# Patient Record
Sex: Female | Born: 1990 | Race: Black or African American | Hispanic: No | Marital: Married | Smoking: Former smoker
Health system: Southern US, Community
[De-identification: ages and names within clinical notes are randomized; demographics above are authoritative.]

## PROBLEM LIST (undated history)

## (undated) DIAGNOSIS — F419 Anxiety disorder, unspecified: Principal | ICD-10-CM

## (undated) DIAGNOSIS — M545 Low back pain, unspecified: Secondary | ICD-10-CM

## (undated) DIAGNOSIS — Z0000 Encounter for general adult medical examination without abnormal findings: Principal | ICD-10-CM

## (undated) DIAGNOSIS — K1379 Other lesions of oral mucosa: Secondary | ICD-10-CM

## (undated) DIAGNOSIS — S161XXA Strain of muscle, fascia and tendon at neck level, initial encounter: Secondary | ICD-10-CM

## (undated) DIAGNOSIS — R070 Pain in throat: Secondary | ICD-10-CM

## (undated) DIAGNOSIS — N611 Abscess of the breast and nipple: Secondary | ICD-10-CM

## (undated) DIAGNOSIS — L02416 Cutaneous abscess of left lower limb: Secondary | ICD-10-CM

## (undated) DIAGNOSIS — T7840XA Allergy, unspecified, initial encounter: Secondary | ICD-10-CM

## (undated) DIAGNOSIS — G43909 Migraine, unspecified, not intractable, without status migrainosus: Secondary | ICD-10-CM

## (undated) DIAGNOSIS — K8050 Calculus of bile duct without cholangitis or cholecystitis without obstruction: Secondary | ICD-10-CM

## (undated) DIAGNOSIS — L02415 Cutaneous abscess of right lower limb: Secondary | ICD-10-CM

## (undated) DIAGNOSIS — K219 Gastro-esophageal reflux disease without esophagitis: Secondary | ICD-10-CM

## (undated) DIAGNOSIS — E119 Type 2 diabetes mellitus without complications: Secondary | ICD-10-CM

## (undated) DIAGNOSIS — IMO0002 Reserved for concepts with insufficient information to code with codable children: Secondary | ICD-10-CM

## (undated) HISTORY — DX: Cutaneous abscess of left lower limb: L02.416

## (undated) HISTORY — DX: Abscess of the breast and nipple: N61.1

## (undated) HISTORY — DX: Type 2 diabetes mellitus without complications: E11.9

## (undated) HISTORY — PX: CHOLECYSTECTOMY: SHX55

## (undated) HISTORY — DX: Reserved for concepts with insufficient information to code with codable children: IMO0002

## (undated) HISTORY — PX: WISDOM TOOTH EXTRACTION: SHX21

## (undated) HISTORY — PX: INCISION AND DRAINAGE: SHX5863

## (undated) HISTORY — DX: Calculus of bile duct without cholangitis or cholecystitis without obstruction: K80.50

## (undated) HISTORY — DX: Gastro-esophageal reflux disease without esophagitis: K21.9

## (undated) HISTORY — DX: Anxiety disorder, unspecified: F41.9

## (undated) HISTORY — DX: Allergy, unspecified, initial encounter: T78.40XA

## (undated) HISTORY — DX: Cutaneous abscess of right lower limb: L02.415

## (undated) SURGERY — Surgical Case
Anesthesia: *Unknown

---
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
## 2014-11-08 ENCOUNTER — Ambulatory Visit: Payer: Self-pay

## 2014-11-16 NOTE — Pre-Procedure Instructions (Unsigned)
Vickki Delynn FlavinJanine Mosher  11/16/2014   Your procedure is scheduled on:  12/03/2014***  Report to {OR ARRIVAL LOCATIONS:20459} at *** AM.  Call this number if you have problems the morning of surgery: {OR PAT  NUMBER TO ZOXW:96045}CALL:20463}   Remember: Do not eat food or drink liquids after midnight.   Take these medicines the morning of surgery with A SIP OF WATER: ***You may take your Lorazepam and Paxil the morning of surgery.  Take 1/2 insulin dose the night before surgery and none the morning of surgery.      Do not wear jewelry, make-up or nail polish.  Do not wear lotions, powders, or perfumes. You may wear deodorant.  Do not shave 48 hours prior to surgery. Men may shave face and neck.  Do not bring valuables to the hospital.  [facility] is not responsible                  for any belongings or valuables.               Contacts, dentures or bridgework may not be worn into surgery.  Leave suitcase in the car. After surgery it may be brought to your room.  For patients admitted to the hospital, discharge time is determined by your                treatment team.               Patients discharged the day of surgery will not be allowed to drive  home.  Name and phone number of your driver: Mother or father  Special Instructions: {OR PAT SPECIAL INSTRUCTIONS:20464} No alcohol 24 hours before or after surgery.   Please read over the following fact sheets that you were given: {OR PAT  FACT SHEETS WUJWJ:19147}GIVEN:20460}

## 2014-12-03 ENCOUNTER — Ambulatory Visit: Payer: BC Managed Care – PPO | Admitting: Anesthesiology

## 2014-12-03 ENCOUNTER — Ambulatory Visit
Admission: RE | Admit: 2014-12-03 | Discharge: 2014-12-03 | Disposition: A | Discharge status: Home / Self Care | Payer: BC Managed Care – PPO | Source: Ambulatory Visit | Attending: Surgery | Admitting: Surgery

## 2014-12-03 ENCOUNTER — Encounter
Admission: RE | Disposition: A | Discharge status: Home / Self Care | Payer: Self-pay | Source: Ambulatory Visit | Attending: Surgery

## 2014-12-03 DIAGNOSIS — K219 Gastro-esophageal reflux disease without esophagitis: Secondary | ICD-10-CM | POA: Diagnosis not present

## 2014-12-03 DIAGNOSIS — F17210 Nicotine dependence, cigarettes, uncomplicated: Secondary | ICD-10-CM | POA: Insufficient documentation

## 2014-12-03 DIAGNOSIS — L0591 Pilonidal cyst without abscess: Secondary | ICD-10-CM | POA: Insufficient documentation

## 2014-12-03 DIAGNOSIS — E119 Type 2 diabetes mellitus without complications: Secondary | ICD-10-CM | POA: Diagnosis not present

## 2014-12-03 HISTORY — PX: PILONIDAL CYST EXCISION: SHX744

## 2014-12-03 LAB — POCT PREGNANCY, URINE
Preg Test, Ur: NEGATIVE
hCG Qual: NEGATIVE

## 2014-12-03 LAB — GLUCOSE, CAPILLARY
Glucose-Capillary: 335 mg/dL — ABNORMAL HIGH (ref 70–99)
Glucose-Capillary: 290 mg/dL — ABNORMAL HIGH (ref 70–99)
Glucose-Capillary: 251 mg/dL — ABNORMAL HIGH (ref 70–99)

## 2014-12-03 SURGERY — EXCISION, PILONIDAL CYST, EXTENSIVE
Anesthesia: General | Site: Coccyx | Wound class: Dirty or Infected

## 2014-12-03 MED ORDER — BUPIVACAINE-EPINEPHRINE (PF) 0.5% -1:200000 IJ SOLN
INTRAMUSCULAR | Status: AC
  Filled 2014-12-03: qty 30

## 2014-12-03 MED ORDER — ONDANSETRON HCL 4 MG/2ML IJ SOLN: 4.0000 mg | Freq: Once | INTRAMUSCULAR | Status: DC | PRN

## 2014-12-03 MED ORDER — GLYCOPYRROLATE 0.2 MG/ML IJ SOLN
INTRAMUSCULAR | Status: DC | PRN
  Administered 2014-12-03: 0.4 mg via INTRAVENOUS

## 2014-12-03 MED ORDER — SODIUM CHLORIDE 0.9 % IR SOLN
Status: DC | PRN
  Administered 2014-12-03: 200 mL

## 2014-12-03 MED ORDER — FENTANYL CITRATE (PF) 100 MCG/2ML IJ SOLN
INTRAMUSCULAR | Status: AC
  Administered 2014-12-03: 25 ug
  Filled 2014-12-03: qty 2

## 2014-12-03 MED ORDER — INSULIN ASPART 100 UNIT/ML ~~LOC~~ SOLN
8.0000 [IU] | Freq: Once | SUBCUTANEOUS | Status: AC
  Administered 2014-12-03: 8 [IU] via SUBCUTANEOUS

## 2014-12-03 MED ORDER — ROCURONIUM BROMIDE 100 MG/10ML IV SOLN
INTRAVENOUS | Status: DC | PRN
  Administered 2014-12-03: 5 mg via INTRAVENOUS
  Administered 2014-12-03: 20 mg via INTRAVENOUS

## 2014-12-03 MED ORDER — FAMOTIDINE 20 MG PO TABS
ORAL_TABLET | ORAL | Status: AC
  Administered 2014-12-03: 20 mg via ORAL
  Filled 2014-12-03: qty 1

## 2014-12-03 MED ORDER — MIDAZOLAM HCL 2 MG/2ML IJ SOLN
INTRAMUSCULAR | Status: DC | PRN
  Administered 2014-12-03: 2 mg via INTRAVENOUS

## 2014-12-03 MED ORDER — FAMOTIDINE 20 MG PO TABS
20.0000 mg | ORAL_TABLET | Freq: Once | ORAL | Status: AC
  Administered 2014-12-03: 20 mg via ORAL

## 2014-12-03 MED ORDER — INSULIN ASPART 100 UNIT/ML ~~LOC~~ SOLN
SUBCUTANEOUS | Status: AC
  Administered 2014-12-03: 8 [IU] via SUBCUTANEOUS
  Filled 2014-12-03: qty 8

## 2014-12-03 MED ORDER — ESMOLOL HCL 10 MG/ML IV SOLN
INTRAVENOUS | Status: DC | PRN
  Administered 2014-12-03: 20 mg via INTRAVENOUS

## 2014-12-03 MED ORDER — PROPOFOL 10 MG/ML IV BOLUS
INTRAVENOUS | Status: DC | PRN
  Administered 2014-12-03: 200 mg via INTRAVENOUS

## 2014-12-03 MED ORDER — FENTANYL CITRATE (PF) 100 MCG/2ML IJ SOLN
25.0000 ug | INTRAMUSCULAR | Status: AC | PRN
  Administered 2014-12-03 (×4): 25 ug via INTRAVENOUS

## 2014-12-03 MED ORDER — ONDANSETRON HCL 4 MG/2ML IJ SOLN
INTRAMUSCULAR | Status: DC | PRN
  Administered 2014-12-03: 4 mg via INTRAVENOUS

## 2014-12-03 MED ORDER — SODIUM CHLORIDE 0.9 % IV SOLN
INTRAVENOUS | Status: DC
  Administered 2014-12-03 (×2): via INTRAVENOUS

## 2014-12-03 MED ORDER — SUCCINYLCHOLINE CHLORIDE 20 MG/ML IJ SOLN
INTRAMUSCULAR | Status: DC | PRN
  Administered 2014-12-03: 100 mg via INTRAVENOUS

## 2014-12-03 MED ORDER — LIDOCAINE HCL (CARDIAC) 20 MG/ML IV SOLN
INTRAVENOUS | Status: DC | PRN
  Administered 2014-12-03: 30 mg via INTRAVENOUS

## 2014-12-03 MED ORDER — HYDROCODONE-ACETAMINOPHEN 5-325 MG PO TABS: 1.0000 | ORAL_TABLET | Freq: Four times a day (QID) | ORAL | Status: DC | PRN

## 2014-12-03 MED ORDER — NEOSTIGMINE METHYLSULFATE 10 MG/10ML IV SOLN
INTRAVENOUS | Status: DC | PRN
  Administered 2014-12-03: 3 mg via INTRAVENOUS

## 2014-12-03 MED ORDER — SODIUM CHLORIDE 0.9 % IV SOLN
1.5000 g | Freq: Once | INTRAVENOUS | Status: AC
  Administered 2014-12-03: 1.5 g via INTRAVENOUS
  Filled 2014-12-03 (×2): qty 1.5

## 2014-12-03 MED ORDER — FENTANYL CITRATE (PF) 100 MCG/2ML IJ SOLN
INTRAMUSCULAR | Status: DC | PRN
  Administered 2014-12-03: 100 ug via INTRAVENOUS

## 2014-12-03 MED ORDER — LACTATED RINGERS IV SOLN
INTRAVENOUS | Status: DC | PRN
  Administered 2014-12-03: 08:00:00 via INTRAVENOUS

## 2014-12-03 SURGICAL SUPPLY — 26 items
BENZOIN TINCTURE PRP APPL 2/3 (GAUZE/BANDAGES/DRESSINGS) ×2 IMPLANT
BLADE CLIPPER SURG (BLADE) IMPLANT
CANISTER SUCT 1200ML W/VALVE (MISCELLANEOUS) ×2 IMPLANT
CHLORAPREP W/TINT 26ML (MISCELLANEOUS) IMPLANT
DERMABOND ADVANCED (GAUZE/BANDAGES/DRESSINGS) ×2
DERMABOND ADVANCED .7 DNX12 (GAUZE/BANDAGES/DRESSINGS) ×2 IMPLANT
DRAPE LAPAROTOMY 100X77 ABD (DRAPES) ×2 IMPLANT
DURAPREP 26ML APPLICATOR (WOUND CARE) ×2 IMPLANT
ELECT CAUTERY BLADE 6.4 (BLADE) ×2 IMPLANT
GLOVE BIO SURGEON STRL SZ7.5 (GLOVE) ×8 IMPLANT
GOWN STRL REUS W/ TWL LRG LVL3 (GOWN DISPOSABLE) ×2 IMPLANT
GOWN STRL REUS W/TWL LRG LVL3 (GOWN DISPOSABLE) ×2
LABEL OR SOLS (LABEL) IMPLANT
NDL SAFETY 25GX1.5 (NEEDLE) IMPLANT
NS IRRIG 500ML POUR BTL (IV SOLUTION) ×2 IMPLANT
PACK BASIN MINOR ARMC (MISCELLANEOUS) ×2 IMPLANT
PAD GROUND ADULT SPLIT (MISCELLANEOUS) ×2 IMPLANT
STRAP SAFETY BODY (MISCELLANEOUS) ×2 IMPLANT
SUT ETHILON 2 0 FS 18 (SUTURE) IMPLANT
SUT ETHILON 3-0 FS-10 30 BLK (SUTURE)
SUT STRIP PLUS 1X5 (SUTURE) IMPLANT
SUT VIC AB 0 CT1 36 (SUTURE) ×2 IMPLANT
SUT VIC AB 2-0 CT1 (SUTURE) ×10 IMPLANT
SUTURE EHLN 3-0 FS-10 30 BLK (SUTURE) IMPLANT
SWABSTK COMLB BENZOIN TINCTURE (MISCELLANEOUS) ×2 IMPLANT
SYRINGE 10CC LL (SYRINGE) IMPLANT

## 2014-12-03 NOTE — Progress Notes (Signed)
PACU NOTE:  Pt Glucose reading is 251 at this time. Dr. Pernell DupreAdams notified. Pt is alert, calm, and talkative and has no s/s of distress noted. Encouraged pt to medicate when she gets home and encouraged Diabetes teaching. Pt verbalized understanding.

## 2014-12-03 NOTE — Discharge Instructions (Signed)
Pilonidal Pilonidal Cyst, Care After A pilonidal cyst occurs when hairs get trapped (ingrown) beneath the skin in the crease between the buttocks over your sacrum (the bone under that crease). Pilonidal cysts are most common in young men with a lot of body hair. When the cyst breaks(ruptured) or leaks, fluid from the cyst may cause burning and itching. If the cyst becomes infected, it causes a painful swelling filled with pus (abscess). The pus and trapped hairs need to be removed (often by lancing) so that the infection can heal. The word pilonidal means hair nest. HOME CARE INSTRUCTIONS If the pilonidal sinus was NOT DRAINING OR LANCED:  Keep the area clean and dry. Bathe or shower daily. Wash the area well with a germ-killing soap. Hot tub baths may help prevent infection. Dry the area well with a towel.  Avoid tight clothing in order to keep area as moisture-free as possible.  Keep area between buttocks as free from hair as possible. A depilatory may be used.  Take antibiotics as directed.  Only take over-the-counter or prescription medicines for pain, discomfort, or fever as directed by your caregiver. If the cyst WAS INFECTED AND NEEDED TO BE DRAINED:  Your caregiver may have packed the wound with gauze to keep the wound open. This allows the wound to heal from the inside outward and continue to drain.  Return as directed for a wound check.  If you take tub baths or showers, repack the wound with gauze as directed following. Sponge baths are a good alternative. Sitz baths may be used three to four times a day or as directed.  If an antibiotic was ordered to fight the infection, take as directed.  Only take over-the-counter or prescription medicines for pain, discomfort, or fever as directed by your caregiver.  If a drain was in place and removed, use sitz baths for 20 minutes 4 times per day. Clean the wound gently with mild unscented soap, pat dry, and then apply a dry dressing as  directed. If you had surgery and IT WAS MARSUPIALIZED (LEFT OPEN):  Your wound was packed with gauze to keep the wound open. This allows the wound to heal from the inside outwards and continue draining. The changing of the dressing regularly also helps keep the wound clean.  Return as directed for a wound check.  If you take tub baths or showers, repack the wound with gauze as directed following. Sponge baths are a good alternative. Sitz baths can also be used. This may be done three to four times a day or as directed.  If an antibiotic was ordered to fight the infection, take as directed.  Only take over-the-counter or prescription medicines for pain, discomfort, or fever as directed by your caregiver.  If you had surgery and the wound was closed you may care for it as directed. This generally includes keeping it dry and clean and dressing it as directed. SEEK MEDICAL CARE IF:   You have increased pain, swelling, redness, drainage, or bleeding from the area.  You have a fever.  You have muscles aches, dizziness, or a general ill feeling. Document Released: 08/16/2006 Document Revised: 03/18/2013 Document Reviewed: 10/31/2006 [facility] Patient Information 2015 [employer] [place]. This information is not intended to replace advice given to you by your health care provider. Make sure you discuss any questions you have with your health care provider.

## 2014-12-03 NOTE — Anesthesia Postprocedure Evaluation (Signed)
  Anesthesia Post-op Note  Patient: Dawn LenisMyra J Ortiz  Procedure(s) Performed: Procedure(s): CYST EXCISION PILONIDAL EXTENSIVE  Anesthesia type:General ETT  Patient location: PACU  Post pain: Pain level controlled  Post assessment: Post-op Vital signs reviewed, Patient's Cardiovascular Status Stable, Respiratory Function Stable, Patent Airway and No signs of Nausea or vomiting  Post vital signs: Reviewed and stable   Level of consciousness: awake, alert  and patient cooperative  Complications: No apparent anesthesia complications

## 2014-12-03 NOTE — Progress Notes (Signed)
H&P unchanged. Paper copy to be scanned into the system. The risks of surgery were again reviewed with the patient and family and they agree to proceed. 

## 2014-12-03 NOTE — Transfer of Care (Signed)
Immediate Anesthesia Transfer of Care Note  Patient: Dawn Ortiz  Procedure(s) Performed: Procedure(s): CYST EXCISION PILONIDAL EXTENSIVE  Patient Location: PACU  Anesthesia Type:General  Level of Consciousness: awake, alert , oriented and patient cooperative  Airway & Oxygen Therapy: Patient Spontanous Breathing and Patient connected to face mask oxygen  Post-op Assessment: Report given to RN, Post -op Vital signs reviewed and stable and Patient moving all extremities  Post vital signs: Reviewed and stable  Last Vitals:  Filed Vitals:   12/03/14 0643  BP: 148/85  Pulse: 122  Temp: 37 C  Resp: 16    Complications: No apparent anesthesia complications

## 2014-12-03 NOTE — Anesthesia Procedure Notes (Signed)
Procedure Name: Intubation Performed by: Edyth GunnelsGILBERT, Mekiyah Gladwell Pre-anesthesia Checklist: Patient identified Patient Re-evaluated:Patient Re-evaluated prior to inductionOxygen Delivery Method: Circle system utilized Preoxygenation: Pre-oxygenation with 100% oxygen Intubation Type: IV induction Ventilation: Mask ventilation without difficulty Laryngoscope Size: Mac and 3 Grade View: Grade II Tube type: Oral Number of attempts: 1 Airway Equipment and Method: Stylet

## 2014-12-03 NOTE — Anesthesia Preprocedure Evaluation (Signed)
Anesthesia Evaluation  Patient identified by MRN, date of birth, ID band Patient awake    Reviewed: Allergy & Precautions, H&P , NPO status , Patient's Chart, lab work & pertinent test results, reviewed documented beta blocker date and time   Airway Mallampati: II  TM Distance: >3 FB Neck ROM: full    Dental  (+) Teeth Intact   Pulmonary Current Smoker, former smoker,          Cardiovascular Rate:Normal     Neuro/Psych    GI/Hepatic GERD-  ,  Endo/Other  diabetes, Poorly Controlled, Type 1  Renal/GU      Musculoskeletal   Abdominal   Peds  Hematology   Anesthesia Other Findings   Reproductive/Obstetrics                             Anesthesia Physical Anesthesia Plan  ASA: II  Anesthesia Plan: General ETT   Post-op Pain Management:    Induction:   Airway Management Planned:   Additional Equipment:   Intra-op Plan:   Post-operative Plan:   Informed Consent: I have reviewed the patients History and Physical, chart, labs and discussed the procedure including the risks, benefits and alternatives for the proposed anesthesia with the patient or authorized representative who has indicated his/her understanding and acceptance.     Plan Discussed with:   Anesthesia Plan Comments:         Anesthesia Quick Evaluation

## 2014-12-03 NOTE — Op Note (Signed)
Operative Note  Preoperative Diagnosis: Pilonidal disease  Postoperative Diagnosis: Same  Operation Performed: Pilonidal excision  Surgeon: Marlynn PerkingW. F. Sya Nestler, Jr., M.D.   Assistant: None  Anesthesia: General Endotracheal  Date of Procedure: 12/03/2014   Procedure in Detail:  The risks (including the possibility of adjacent organ injury, the necessity of converting to an open procedure, and the risk of postoperative infection / abscess), potential benefits, non-surgical treatment options, and expected outcomes were reviewed with the patient. The patient concurred with the proposed plan and agreed to proceed, giving informed consent.   Prior to the induction of anesthesia, antibiotic prophylaxis was administered. The patient was placed prone on the OR table and prepped and draped in the usual sterile fashion.   A thin elliptical incision was made surrounding the natal cleft, the pits, and all of the scar tissue. This was carried down through the subcutaneous tissue all the way to the presacral fascia, and included all of the granulation tissue and pathology. Hemostasis was achieved with electrocautery and figure of eight 2-0 Vicryl sutures. A 3 layer closure was performed with the deep layer performed with interrupted 2-0 Vicryl the intermediate level with a running 3-0 Monocryl and subcuticular layer of running 3-0 PDS. Dermabond was applied completing the procedure.  Findings: No active disease; no pus, no cellulitis, no exposed hair.          Specimens: Pilonidal area           Complications: None; the patient tolerated the procedure well.   Claude MangesWilliam F Deboraha Goar, MD 12/03/2014

## 2014-12-06 LAB — SURGICAL PATHOLOGY

## 2014-12-06 NOTE — OR Nursing (Signed)
Follow up appointment scheduled for 12/10/14.  Patient has been improving each day

## 2014-12-07 ENCOUNTER — Encounter: Payer: Self-pay | Admitting: Surgery

## 2015-03-04 ENCOUNTER — Ambulatory Visit: Payer: Self-pay | Admitting: Surgery

## 2016-01-09 ENCOUNTER — Ambulatory Visit: Payer: Self-pay | Admitting: Physician Assistant

## 2016-01-09 ENCOUNTER — Encounter: Payer: Self-pay | Admitting: Physician Assistant

## 2016-01-09 VITALS — BP 120/80 | HR 110 | Temp 98.4°F

## 2016-01-09 DIAGNOSIS — R000 Tachycardia, unspecified: Secondary | ICD-10-CM

## 2016-01-09 NOTE — Addendum Note (Signed)
Addended by: Catha BrowEACON, MONIQUE T on: 01/09/2016 03:04 PM   Modules accepted: Orders

## 2016-01-09 NOTE — Progress Notes (Signed)
S: c/o chest discomfort for over a week, states her heart rate has been high for about 2years, now its staying high all of the time and has been worsened by the exercise she's doing, is having to stop and catch her breath, ?if asthma but her chest will hurt when this happens, no known wheezing, is DMI, denies palpitations, fluttering, or other problems, has bc implant , had one 3 for 3 years and just had it switched out this spring  O: vitals w elevated hr at 110, lungs c t a, cv rrr, nad; ekg shows tachycardia  A: tachycardia with chest discomfort  P: refer to cardiology, no strenuous exercise until has been cleared by cardiology

## 2016-01-09 NOTE — Addendum Note (Signed)
Addended by: Catha BrowEACON, MONIQUE T on: 01/09/2016 01:31 PM   Modules accepted: Orders

## 2016-01-09 NOTE — Progress Notes (Signed)
Patient ID: Arnoldo LenisMyra J Ortiz, female   DOB: 16-Mar-1991, 25 y.o.   MRN: [id number] Patient [facility] for 01/11/16 at 2:45.  Patient has been notified and will be at her appointment.

## 2016-01-11 ENCOUNTER — Encounter: Payer: Self-pay | Admitting: Cardiology

## 2016-01-11 ENCOUNTER — Ambulatory Visit (INDEPENDENT_AMBULATORY_CARE_PROVIDER_SITE_OTHER): Payer: BLUE CROSS/BLUE SHIELD | Admitting: Cardiology

## 2016-01-11 VITALS — BP 118/80 | HR 108 | Ht 61.5 in | Wt 176.5 lb

## 2016-01-11 DIAGNOSIS — R079 Chest pain, unspecified: Secondary | ICD-10-CM | POA: Diagnosis not present

## 2016-01-11 DIAGNOSIS — R0602 Shortness of breath: Secondary | ICD-10-CM | POA: Diagnosis not present

## 2016-01-11 DIAGNOSIS — R000 Tachycardia, unspecified: Secondary | ICD-10-CM | POA: Diagnosis not present

## 2016-01-11 NOTE — Progress Notes (Signed)
Cardiology Office Note   Date:  01/11/2016   ID:  Dawn Ortiz, DOB [birth year], MRN [id number]  Referring Doctor:  Rafael Bihari, MD   Cardiologist:   Wende Bushy, MD   Reason for consultation:  Chief Complaint  Patient presents with  . other    Ref by Dr. Andre Lefort for rapid heart beats. Meds reviewed by the patient verbally. Pt. c/o chest pain, shortness of breath and rapid heart beats.       History of Present Illness: Dawn Ortiz is a 25 y.o. female who presents forPalpitations  Patient reports that she's always had an elevated heart rate for approximately 2 years now. This is noted on routine office visits to doctors, that her resting heart rate is up in the 90s. More recently though, she has noted significant palpitations and chest discomfort during and after exercise/working out in the gym/boot camp. Palpitations are moderate to severe intensity, lasting minutes at a time, staying in the chest, nonradiating. The chest discomfort is described as the discomfort mainly on the right side of the chest, nonradiating been present for days now.  Patient is a type I diabetic. She is not aware of her A1c.  No fever, cough, colds, abdominal pain, PND, orthopnea, edema   ROS:  Please see the history of present illness. Aside from mentioned under HPI, all other systems are reviewed and negative.     Past Medical History  Diagnosis Date  . Diabetes mellitus without complication ([place])   . Anxiety   . GERD (gastroesophageal reflux disease)     Past Surgical History  Procedure Laterality Date  . Incision and drainage    . Pilonidal cyst excision  12/03/2014    Procedure: CYST EXCISION PILONIDAL EXTENSIVE;  Surgeon: Molly Maduro, MD;  Location: [facility] ORS;  Service: General;;     reports that she has quit smoking. Her smoking use included Cigarettes. She does not have any smokeless tobacco history on file. She reports that she drinks about 0.6 - 1.2 oz of  alcohol per week.   Family history is unknown by patient. Father probably diagnosed with CAD in his 43s. Maternal grandmother likely has a pacemaker, patient is unsure  Current Outpatient Prescriptions  Medication Sig Dispense Refill  . etonogestrel (NEXPLANON) 68 MG IMPL implant 1 each by Subdermal route once.    Marland Kitchen HYDROcodone-acetaminophen (NORCO) 5-325 MG per tablet Take 1 tablet by mouth every 6 (six) hours as needed for moderate pain. 30 tablet 0  . insulin glargine (LANTUS) 100 UNIT/ML injection Inject 65 Units into the skin at bedtime.    . insulin lispro protamine-lispro (HUMALOG 75/25 MIX) (75-25) 100 UNIT/ML SUSP injection Inject 50 Units into the skin 2 (two) times daily.    Marland Kitchen LORazepam (ATIVAN) 0.5 MG tablet Take 0.5 mg by mouth every 8 (eight) hours as needed for anxiety.    Marland Kitchen PARoxetine (PAXIL) 10 MG tablet Take 10 mg by mouth at bedtime.     No current facility-administered medications for this visit.    Allergies: Sulfa antibiotics    PHYSICAL EXAM: VS:  BP 118/80 mmHg  Pulse 108  Ht 5' 1.5" (1.562 m)  Wt 176 lb 8 oz (80.06 kg)  BMI 32.81 kg/m2  SpO2 98% , Body mass index is 32.81 kg/(m^2). Wt Readings from Last 3 Encounters:  01/11/16 176 lb 8 oz (80.06 kg)    GENERAL:  well developed, well nourished, obese, not in acute distress HEENT: normocephalic, pink conjunctivae, anicteric sclerae,  no xanthelasma, normal dentition, oropharynx clear NECK:  no neck vein engorgement, JVP normal, no hepatojugular reflux, carotid upstroke brisk and symmetric, no bruit, no thyromegaly, no lymphadenopathy LUNGS:  good respiratory effort, clear to auscultation bilaterally CV:  PMI not displaced, no thrills, no lifts, S1 and S2 within normal limits, no palpable S3 or S4, no murmurs, no rubs, no gallops ABD:  Soft, nontender, nondistended, normoactive bowel sounds, no abdominal aortic bruit, no hepatomegaly, no splenomegaly MS: nontender back, no kyphosis, no scoliosis, no joint  deformities EXT:  2+ DP/PT pulses, no edema, no varicosities, no cyanosis, no clubbing SKIN: warm, nondiaphoretic, normal turgor, no ulcers NEUROPSYCH: alert, oriented to person, place, and time, sensory/motor grossly intact, normal mood, appropriate affect  Recent Labs: No results found for requested labs within last 365 days.   Lipid Panel No results found for: CHOL, TRIG, HDL, CHOLHDL, VLDL, LDLCALC, LDLDIRECT   Other studies Reviewed:  EKG:  The ekg from 01/11/2016 was personally reviewed by me and it revealed sinus tachycardia, 108 BPM, nonspecific T-wave changes.  Additional studies/ records that were reviewed personally reviewed by me today include: None available   ASSESSMENT AND PLAN:  Palpitations Check TSH and free T4, plasma fractionated metanephrines, CBC, CMP. Recommend echocardiogram. Recommend Holter monitor.   Chest pain and shortness of breath  Chest pain is atypical  Resting EKG is sinus tachycardia with nonspecific T-wave changes which may be related to the rate Recommend stress echocardiogram for further evaluation. Patient is diabetic, type I. Recommend to auscultate lungs and evaluate for wheezing at near peak to peak exercise or if patient starts complaining of shortness of breath or chest pain during stress test.  Current medicines are reviewed at length with the patient today.  The patient does not have concerns regarding medicines.  Labs/ tests ordered today include:  Orders Placed This Encounter  Procedures  . T4, free  . TSH  . CBC with Differential/Platelet  . Comp Met (CMET)  . Metanephrines, plasma  . Holter monitor - 24 hour  . EKG 12-Lead  . ECHOCARDIOGRAM COMPLETE  . ECHO STRESS TEST    I had a lengthy and detailed discussion with the patient regarding diagnoses, prognosis, diagnostic options, treatment options .   I counseled the patient on importance of lifestyle modification including heart healthy diet, regular physical  activity once cardiac workup was done   Disposition:   FU with undersigned after tests  Signed, Wende Bushy, MD  01/11/2016 3:50 PM    [place]

## 2016-01-11 NOTE — Patient Instructions (Addendum)
Medication Instructions:  Your physician recommends that you continue on your current medications as directed. Please refer to the Current Medication list given to you today.   Labwork: Your physician recommends that you return for fasting lab work to [facility] medical mall entrance of the hospital. Check into front desk to register and then head to lab. Make sure to not eat or drink after midnight prior to coming in for labs. Labs we are checking are TSH, T4, CBC, CMP, and Plasma metanephrines. Take lab slips with you.   Testing/Procedures: Your physician has requested that you have an echocardiogram. Echocardiography is a painless test that uses sound waves to create images of your heart. It provides your doctor with information about the size and shape of your heart and how well your heart's chambers and valves are working. This procedure takes approximately one hour. There are no restrictions for this procedure.  Date & Time: _____________________________________________________  Your physician has recommended that you wear a holter monitor. Holter monitors are medical devices that record the heart's electrical activity. Doctors most often use these monitors to diagnose arrhythmias. Arrhythmias are problems with the speed or rhythm of the heartbeat. The monitor is a small, portable device. You can wear one while you do your normal daily activities. This is usually used to diagnose what is causing palpitations/syncope (passing out).  Date & Time: _____________________________________________________  Your physician has requested that you have a stress echocardiogram. For further information please visit [email or web address] Please follow instruction sheet as given.  Date & Time: ____________________________________________________  Follow-Up: Your physician recommends that you schedule a follow-up appointment after testing to review results.   Date & Time:  _______________________________________________________   Any Other Special Instructions Will Be Listed Below (If Applicable).     If you need a refill on your cardiac medications before your next appointment, please call your pharmacy.  Echocardiogram An echocardiogram, or echocardiography, uses sound waves (ultrasound) to produce an image of your heart. The echocardiogram is simple, painless, obtained within a short period of time, and offers valuable information to your health care provider. The images from an echocardiogram can provide information such as:  Evidence of coronary artery disease (CAD).  Heart size.  Heart muscle function.  Heart valve function.  Aneurysm detection.  Evidence of a past heart attack.  Fluid buildup around the heart.  Heart muscle thickening.  Assess heart valve function. LET [facility] CARE PROVIDER KNOW ABOUT:  Any allergies you have.  All medicines you are taking, including vitamins, herbs, eye drops, creams, and over-the-counter medicines.  Previous problems you or members of your family have had with the use of anesthetics.  Any blood disorders you have.  Previous surgeries you have had.  Medical conditions you have.  Possibility of pregnancy, if this applies. BEFORE THE PROCEDURE  No special preparation is needed. Eat and drink normally.  PROCEDURE   In order to produce an image of your heart, gel will be applied to your chest and a wand-like tool (transducer) will be moved over your chest. The gel will help transmit the sound waves from the transducer. The sound waves will harmlessly bounce off your heart to allow the heart images to be captured in real-time motion. These images will then be recorded.  You may need an IV to receive a medicine that improves the quality of the pictures. AFTER THE PROCEDURE You may return to your normal schedule including diet, activities, and medicines, unless your health care provider tells  you otherwise.  This information is not intended to replace advice given to you by your health care provider. Make sure you discuss any questions you have with your health care provider.   Document Released: 07/13/2000 Document Revised: 08/06/2014 Document Reviewed: 03/23/2013 Elsevier Interactive Patient Education 2016 [employer].   Exercise Stress Echocardiogram An exercise stress echocardiogram is a heart (cardiac) test used to check the function of your heart. This test may also be called an exercise stress echocardiography or stress echo. This stress test will check how well your heart muscle and valves are working and determine if your heart muscle is getting enough blood. You will exercise on a treadmill to naturally increase or stress the functioning of your heart.  An echocardiogram uses sound waves (ultrasound) to produce an image of your heart. If your heart does not work normally, it may indicate coronary artery disease with poor coronary blood supply. The coronary arteries are the arteries that bring blood and oxygen to your heart. LET [facility] CARE PROVIDER KNOW ABOUT:  Any allergies you have.  All medicines you are taking, including vitamins, herbs, eye drops, creams, and over-the-counter medicines.  Previous problems you or members of your family have had with the use of anesthetics.  Any blood disorders you have.  Previous surgeries you have had.  Medical conditions you have.  Possibility of pregnancy, if this applies. RISKS AND COMPLICATIONS Generally, this is a safe procedure. However, as with any procedure, complications can occur. Possible complications can include:  You develop pain or pressure in the following areas:  Chest.  Jaw or neck.  Between your shoulder blades.  Radiating down your left arm.  Dizziness or lightheadedness.  Shortness of breath.  Increased or irregular heartbeat.  Nausea or vomiting.  Heart attack (rare). BEFORE THE  PROCEDURE  Avoid all forms of caffeine for 24 hours before your test or as directed by your health care provider. This includes coffee, tea (even decaffeinated tea), caffeinated sodas, chocolate, cocoa, and certain pain medicines.  Follow your health care provider's instructions regarding eating and drinking before the test.  Take your medicines as directed at regular times with water unless instructed otherwise. Exceptions may include:  If you have diabetes, ask how you are to take your insulin or pills. It is common to adjust insulin dosing the morning of the test.  If you are taking beta-blocker medicines, it is important to talk to your health care provider about these medicines well before the date of your test. Taking beta-blocker medicines may interfere with the test. In some cases, these medicines need to be changed or stopped 24 hours or more before the test.  If you wear a nitroglycerin patch, it may need to be removed prior to the test. Ask your health care provider if the patch should be removed before the test.  If you use an inhaler for any breathing condition, bring it with you to the test.  If you are an outpatient, bring a snack so you can eat right after the stress phase of the test.  Do not smoke for 4 hours prior to the test or as directed by your health care provider.  Wear loose-fitting clothes and comfortable shoes for the test. This test involves walking on a treadmill. PROCEDURE   Multiple electrodes will be put on your chest. If needed, small areas of your chest may be shaved to get better contact with the electrodes. Once the electrodes are attached to your body, multiple wires will be attached  to the electrodes, and your heart rate will be monitored.  You will have an echocardiogram done at rest.  To produce this image of your heart, gel is applied to your chest, and a wand-like tool (transducer) is moved over the chest. The transducer sends the sound waves  through the chest to create the moving images of your heart.  You may need an IV to receive a medication that improves the quality of the pictures.  You will then walk on a treadmill. The treadmill will be started at a slow pace. The treadmill speed and incline will gradually be increased to raise your heart rate.  At the peak of exercise, the treadmill will be stopped. You will lie down immediately on a bed so that a second echocardiogram can be done to visualize your heart's motion with exercise.  The test usually takes 30-60 minutes to complete. AFTER THE PROCEDURE  Your heart rate and blood pressure will be monitored after the test.  You may return to your normal schedule, including diet, activities, and medicines, unless your health care provider tells you otherwise.   This information is not intended to replace advice given to you by your health care provider. Make sure you discuss any questions you have with your health care provider.   Document Released: 07/20/2004 Document Revised: 07/21/2013 Document Reviewed: 03/23/2013 Elsevier Interactive Patient Education 2016 Elsevier Inc.   Holter Monitoring A Holter monitor is a small device that is used to detect abnormal heart rhythms. It clips to your clothing and is connected by wires to flat, sticky disks (electrodes) that attach to your chest. It is worn continuously for 24-48 hours. HOME CARE INSTRUCTIONS  Wear your Holter monitor at all times, even while exercising and sleeping, for as long as directed by your health care provider.  Make sure that the Holter monitor is safely clipped to your clothing or close to your body as recommended by your health care provider.  Do not get the monitor or wires wet.  Do not put body lotion or moisturizer on your chest.  Keep your skin clean.  Keep a diary of your daily activities, such as walking and doing chores. If you feel that your heartbeat is abnormal or that your heart is  fluttering or skipping a beat:  Record what you are doing when it happens.  Record what time of day the symptoms occur.  Return your Holter monitor as directed by your health care provider.  Keep all follow-up visits as directed by your health care provider. This is important. SEEK IMMEDIATE MEDICAL CARE IF:  You feel lightheaded or you faint.  You have trouble breathing.  You feel pain in your chest, upper arm, or jaw.  You feel sick to your stomach and your skin is pale, cool, or damp.  You heartbeat feels unusual or abnormal.   This information is not intended to replace advice given to you by your health care provider. Make sure you discuss any questions you have with your health care provider.   Document Released: 04/13/2004 Document Revised: 08/06/2014 Document Reviewed: 02/22/2014 Elsevier Interactive Patient Education [employer] Elsevier Inc.

## 2016-01-27 ENCOUNTER — Encounter (INDEPENDENT_AMBULATORY_CARE_PROVIDER_SITE_OTHER): Payer: Self-pay

## 2016-01-27 ENCOUNTER — Other Ambulatory Visit: Payer: Self-pay | Admitting: Physician Assistant

## 2016-01-27 DIAGNOSIS — Z299 Encounter for prophylactic measures, unspecified: Secondary | ICD-10-CM

## 2016-01-27 NOTE — Progress Notes (Signed)
Patient came in to have blood drawn for testing per Dr. Almond Lint[facility]ileen Ingal [facility] [place].

## 2016-01-30 LAB — COMPREHENSIVE METABOLIC PANEL
A/G RATIO: 1.3 (ref 1.2–2.2)
ALBUMIN: 4.5 g/dL (ref 3.5–5.5)
ALK PHOS: 134 IU/L — AB (ref 39–117)
ALT: 16 IU/L (ref 0–32)
AST: 12 IU/L (ref 0–40)
BUN/Creatinine Ratio: 18 (ref 9–23)
BUN: 12 mg/dL (ref 6–20)
Bilirubin Total: 0.3 mg/dL (ref 0.0–1.2)
CO2: 21 mmol/L (ref 18–29)
Calcium: 9.7 mg/dL (ref 8.7–10.2)
Chloride: 98 mmol/L (ref 96–106)
Creatinine, Ser: 0.68 mg/dL (ref 0.57–1.00)
GFR calc Af Amer: 142 mL/min/{1.73_m2} (ref >59)
GFR, EST NON AFRICAN AMERICAN: 123 mL/min/{1.73_m2} (ref >59)
GLOBULIN, TOTAL: 3.4 g/dL (ref 1.5–4.5)
Glucose: 279 mg/dL — ABNORMAL HIGH (ref 65–99)
POTASSIUM: 4.6 mmol/L (ref 3.5–5.2)
SODIUM: 136 mmol/L (ref 134–144)
Total Protein: 7.9 g/dL (ref 6.0–8.5)

## 2016-01-30 LAB — CBC WITH DIFFERENTIAL/PLATELET
BASOS: 0 %
Basophils Absolute: 0 10*3/uL (ref 0.0–0.2)
EOS (ABSOLUTE): 0.1 10*3/uL (ref 0.0–0.4)
Eos: 1 %
Hematocrit: 40.7 % (ref 34.0–46.6)
Hemoglobin: 13 g/dL (ref 11.1–15.9)
Immature Grans (Abs): 0 10*3/uL (ref 0.0–0.1)
Immature Granulocytes: 0 %
LYMPHS ABS: 3.5 10*3/uL — AB (ref 0.7–3.1)
Lymphs: 40 %
MCH: 25.4 pg — ABNORMAL LOW (ref 26.6–33.0)
MCHC: 31.9 g/dL (ref 31.5–35.7)
MCV: 80 fL (ref 79–97)
MONOS ABS: 0.4 10*3/uL (ref 0.1–0.9)
Monocytes: 5 %
Neutrophils Absolute: 4.6 10*3/uL (ref 1.4–7.0)
Neutrophils: 54 %
Platelets: 280 10*3/uL (ref 150–379)
RBC: 5.12 x10E6/uL (ref 3.77–5.28)
RDW: 14.3 % (ref 12.3–15.4)
WBC: 8.6 10*3/uL (ref 3.4–10.8)

## 2016-01-30 LAB — TSH+FREE T4
Free T4: 1.27 ng/dL (ref 0.82–1.77)
TSH: 0.712 u[IU]/mL (ref 0.450–4.500)

## 2016-01-30 LAB — METHYLMALONIC ACID, SERUM: Methylmalonic Acid: 71 nmol/L (ref 0–378)

## 2016-02-01 NOTE — Progress Notes (Signed)
Dr. Alvino ChapelIngal did not order the Methylmalonic acid. She ordered a plasma metanephrines test which was not done. Called [facility] regarding this and had them add this test on.

## 2016-02-05 LAB — METANEPHRINES, PLASMA
Metanephrine, Free: 14 pg/mL (ref 0–62)
NORMETANEPHRINE FREE: 61 pg/mL (ref 0–145)

## 2016-02-05 LAB — SPECIMEN STATUS REPORT

## 2016-02-08 ENCOUNTER — Other Ambulatory Visit: Payer: Self-pay

## 2016-02-15 ENCOUNTER — Other Ambulatory Visit: Payer: Self-pay

## 2016-02-21 ENCOUNTER — Ambulatory Visit: Payer: Self-pay | Admitting: Physician Assistant

## 2016-02-24 ENCOUNTER — Ambulatory Visit (INDEPENDENT_AMBULATORY_CARE_PROVIDER_SITE_OTHER): Payer: Managed Care, Other (non HMO)

## 2016-02-24 ENCOUNTER — Encounter: Payer: Self-pay | Admitting: Physician Assistant

## 2016-02-24 ENCOUNTER — Other Ambulatory Visit: Payer: Self-pay

## 2016-02-24 ENCOUNTER — Ambulatory Visit: Payer: Self-pay | Admitting: Physician Assistant

## 2016-02-24 ENCOUNTER — Ambulatory Visit: Payer: Self-pay

## 2016-02-24 VITALS — BP 110/70 | HR 111 | Temp 98.5°F

## 2016-02-24 DIAGNOSIS — R079 Chest pain, unspecified: Secondary | ICD-10-CM

## 2016-02-24 DIAGNOSIS — F411 Generalized anxiety disorder: Secondary | ICD-10-CM

## 2016-02-24 DIAGNOSIS — R000 Tachycardia, unspecified: Secondary | ICD-10-CM | POA: Diagnosis not present

## 2016-02-24 DIAGNOSIS — R0602 Shortness of breath: Secondary | ICD-10-CM | POA: Diagnosis not present

## 2016-02-24 MED ORDER — PAROXETINE HCL 10 MG PO TABS
10.0000 mg | ORAL_TABLET | Freq: Every day | ORAL | 5 refills | Status: DC
Start: 2016-02-24 — End: 2017-05-27

## 2016-02-24 NOTE — Progress Notes (Signed)
S: ? If could get a med refill, states she use to take paroxetine and xanax but hasn't taken it in a long time bc she felt better, now is having some anxiety sx and would like to go back on medication  O: vitals w elevated heart rate, lungs c t a, cv reg rhythm with tachycardia, neuro intact  A: anxiety  P paroxetine 10mg , explained to pt that we do not rx xanax on regular basis and she will need to see her pcp if she wants this medication

## 2016-02-27 ENCOUNTER — Other Ambulatory Visit: Payer: Self-pay

## 2016-02-27 LAB — ECHOCARDIOGRAM COMPLETE

## 2016-02-28 ENCOUNTER — Telehealth: Payer: Self-pay | Admitting: *Deleted

## 2016-02-28 NOTE — Telephone Encounter (Signed)
-----   Message from Almond Lint, MD sent at 02/28/2016 10:33 AM EDT ----- Heart function okay

## 2016-02-28 NOTE — Telephone Encounter (Signed)
Patient called in for results. Reviewed detailed echocardiogram results with patient. Also confirmed patients upcoming stress echo and holter monitor appointments and instructions. She verbalized understanding of our conversation and had no further questions.

## 2016-02-29 ENCOUNTER — Ambulatory Visit: Payer: Self-pay | Admitting: Cardiology

## 2016-03-01 ENCOUNTER — Ambulatory Visit: Payer: Managed Care, Other (non HMO)

## 2016-03-01 ENCOUNTER — Ambulatory Visit (INDEPENDENT_AMBULATORY_CARE_PROVIDER_SITE_OTHER): Payer: Managed Care, Other (non HMO)

## 2016-03-01 DIAGNOSIS — R000 Tachycardia, unspecified: Secondary | ICD-10-CM | POA: Diagnosis not present

## 2016-03-01 DIAGNOSIS — R079 Chest pain, unspecified: Secondary | ICD-10-CM

## 2016-03-01 DIAGNOSIS — R0602 Shortness of breath: Secondary | ICD-10-CM | POA: Diagnosis not present

## 2016-03-01 LAB — ECHOCARDIOGRAM STRESS TEST
CSEPEW: 10.7 METS
CSEPHR: 103 %
CSEPPHR: 203 {beats}/min
Exercise duration (min): 9 min
Exercise duration (sec): 23 s
MPHR: 196 {beats}/min
Rest HR: 116 {beats}/min

## 2016-03-06 ENCOUNTER — Ambulatory Visit: Payer: Self-pay | Admitting: Cardiology

## 2016-03-15 ENCOUNTER — Ambulatory Visit (INDEPENDENT_AMBULATORY_CARE_PROVIDER_SITE_OTHER): Payer: Managed Care, Other (non HMO)

## 2016-03-15 DIAGNOSIS — R0602 Shortness of breath: Secondary | ICD-10-CM | POA: Diagnosis not present

## 2016-03-15 DIAGNOSIS — R079 Chest pain, unspecified: Secondary | ICD-10-CM | POA: Diagnosis not present

## 2016-03-15 DIAGNOSIS — R000 Tachycardia, unspecified: Secondary | ICD-10-CM | POA: Diagnosis not present

## 2016-03-21 ENCOUNTER — Ambulatory Visit
Admission: RE | Admit: 2016-03-21 | Discharge: 2016-03-21 | Disposition: A | Discharge status: Home / Self Care | Payer: Managed Care, Other (non HMO) | Source: Ambulatory Visit | Attending: Cardiology | Admitting: Cardiology

## 2016-03-21 DIAGNOSIS — R000 Tachycardia, unspecified: Secondary | ICD-10-CM | POA: Insufficient documentation

## 2016-03-24 NOTE — Progress Notes (Deleted)
Cardiology Office Note   Date:  03/24/2016   ID:  Dawn Ortiz, DOB 24-Jan-1991, MRN [id number]  Referring Doctor:  Ashley Mariner, MD   Cardiologist:   Almond Lint, MD   Reason for consultation:  No chief complaint on file.     History of Present Illness: Dawn Ortiz is a 25 y.o. female who presents forPalpitations  Patient reports that she's always had an elevated heart rate for approximately 2 years now. This is noted on routine office visits to doctors, that her resting heart rate is up in the 90s. More recently though, she has noted significant palpitations and chest discomfort during and after exercise/working out in the gym/boot camp. Palpitations are moderate to severe intensity, lasting minutes at a time, staying in the chest, nonradiating. The chest discomfort is described as the discomfort mainly on the right side of the chest, nonradiating been present for days now.  Patient is a type I diabetic. She is not aware of her A1c.  No fever, cough, colds, abdominal pain, PND, orthopnea, edema   ROS:  Please see the history of present illness. Aside from mentioned under HPI, all other systems are reviewed and negative.     Past Medical History:  Diagnosis Date  . Anxiety   . Diabetes mellitus without complication (HCC)   . GERD (gastroesophageal reflux disease)     Past Surgical History:  Procedure Laterality Date  . INCISION AND DRAINAGE    . PILONIDAL CYST EXCISION  12/03/2014   Procedure: CYST EXCISION PILONIDAL EXTENSIVE;  Surgeon: Duwaine Maxin, MD;  Location: [facility] ORS;  Service: General;;     reports that she has quit smoking. Her smoking use included Cigarettes. She does not have any smokeless tobacco history on file. She reports that she drinks about 0.6 - 1.2 oz of alcohol per week .   Family history is unknown by patient. Father probably diagnosed with CAD in his 30s. Maternal grandmother likely has a pacemaker, patient is  unsure  Current Outpatient Prescriptions  Medication Sig Dispense Refill  . etonogestrel (NEXPLANON) 68 MG IMPL implant 1 each by Subdermal route once.    Marland Kitchen HYDROcodone-acetaminophen (NORCO) 5-325 MG per tablet Take 1 tablet by mouth every 6 (six) hours as needed for moderate pain. 30 tablet 0  . insulin glargine (LANTUS) 100 UNIT/ML injection Inject 65 Units into the skin at bedtime.    . insulin lispro protamine-lispro (HUMALOG 75/25 MIX) (75-25) 100 UNIT/ML SUSP injection Inject 50 Units into the skin 2 (two) times daily.    Marland Kitchen LORazepam (ATIVAN) 0.5 MG tablet Take 0.5 mg by mouth every 8 (eight) hours as needed for anxiety.    Marland Kitchen PARoxetine (PAXIL) 10 MG tablet Take 1 tablet (10 mg total) by mouth at bedtime. 30 tablet 5   No current facility-administered medications for this visit.     Allergies: Sulfa antibiotics    PHYSICAL EXAM: VS:  There were no vitals taken for this visit. , There is no height or weight on file to calculate BMI. Wt Readings from Last 3 Encounters:  01/11/16 176 lb 8 oz (80.1 kg)    GENERAL:  well developed, well nourished, obese, not in acute distress HEENT: normocephalic, pink conjunctivae, anicteric sclerae, no xanthelasma, normal dentition, oropharynx clear NECK:  no neck vein engorgement, JVP normal, no hepatojugular reflux, carotid upstroke brisk and symmetric, no bruit, no thyromegaly, no lymphadenopathy LUNGS:  good respiratory effort, clear to auscultation bilaterally CV:  PMI not displaced, no  thrills, no lifts, S1 and S2 within normal limits, no palpable S3 or S4, no murmurs, no rubs, no gallops ABD:  Soft, nontender, nondistended, normoactive bowel sounds, no abdominal aortic bruit, no hepatomegaly, no splenomegaly MS: nontender back, no kyphosis, no scoliosis, no joint deformities EXT:  2+ DP/PT pulses, no edema, no varicosities, no cyanosis, no clubbing SKIN: warm, nondiaphoretic, normal turgor, no ulcers NEUROPSYCH: alert, oriented to person,  place, and time, sensory/motor grossly intact, normal mood, appropriate affect  Recent Labs: 01/27/2016: ALT 16; BUN 12; Creatinine, Ser 0.68; Platelets 280; Potassium 4.6; Sodium 136; TSH 0.712   Lipid Panel No results found for: CHOL, TRIG, HDL, CHOLHDL, VLDL, LDLCALC, LDLDIRECT   Other studies Reviewed:  EKG:  The ekg from 01/11/2016 was personally reviewed by me and it revealed sinus tachycardia, 108 BPM, nonspecific T-wave changes.  Additional studies/ records that were reviewed personally reviewed by me today include:  Echo 02/24/2016: Left ventricle: The cavity size was normal. Wall thickness was   normal. Systolic function was normal. The estimated ejection   fraction was in the range of 60% to 65%. Wall motion was normal;   there were no regional wall motion abnormalities. Left   ventricular diastolic function parameters were normal.   Impressions:   - Normal study.  Stress echo 03/01/2016: Stress ECG conclusions: There were no stress arrhythmias or   conduction abnormalities. The stress ECG was negative for   ischemia. - Staged echo: There was no echocardiographic evidence for   stress-induced ischemia.  Impressions:  - Normal study after maximal exercise. Sinus tachycardia at rest   (115) with peak heart rate 203 bpm at 9 min, achieving 10.7 mets,   103% of her max predicted heart rate.    Holter 03/15/2016: Overall rhythm was sinus. Heart ranged from 75 to 145 bpm, with average of 106 bpm. 80% of HR is in tachycardia.  Longest RR interval is normal at 1.08s.  No high grade supraventricular ectopy: 2 isolated PACs, no true runs.  No high grade ventricular ectopy: 5 isolated PVCs.   No detected atrial fibrillation.     ASSESSMENT AND PLAN:  Palpitations TSH and free T4, plasma fractionated metanephrines, CBC, CMP - wnl Recommend echocardiogram. Recommend Holter monitor.   Chest pain and shortness of breath  Chest pain is atypical  Resting EKG is sinus  tachycardia with nonspecific T-wave changes which may be related to the rate Recommend stress echocardiogram for further evaluation. Patient is diabetic, type I. Recommend to auscultate lungs and evaluate for wheezing at near peak to peak exercise or if patient starts complaining of shortness of breath or chest pain during stress test.  Current medicines are reviewed at length with the patient today.  The patient does not have concerns regarding medicines.  Labs/ tests ordered today include:  No orders of the defined types were placed in this encounter.   I had a lengthy and detailed discussion with the patient regarding diagnoses, prognosis, diagnostic options, treatment options .   I counseled the patient on importance of lifestyle modification including heart healthy diet, regular physical activity once cardiac workup was done   Disposition:   FU with undersigned after tests  Signed, Almond LintAileen Adesuwa Osgood, MD  03/24/2016 11:44 AM    [facility]

## 2016-03-27 ENCOUNTER — Ambulatory Visit: Payer: Self-pay | Admitting: Cardiology

## 2016-03-27 NOTE — Progress Notes (Deleted)
Cardiology Office Note   Date:  03/27/2016   ID:  Dawn Ortiz, DOB [birth year], MRN [id number]  Referring Doctor:  Ashley MarinerGONZALEZ, CHRISTINA MARIE, MD   Cardiologist:   Almond LintAileen Caleesi Kohl, MD   Reason for consultation:  No chief complaint on file.     History of Present Illness: Dawn Ortiz is a 25 y.o. female who presents forPalpitations  Patient reports that she's always had an elevated heart rate for approximately 2 years now. This is noted on routine office visits to doctors, that her resting heart rate is up in the 90s. More recently though, she has noted significant palpitations and chest discomfort during and after exercise/working out in the gym/boot camp. Palpitations are moderate to severe intensity, lasting minutes at a time, staying in the chest, nonradiating. The chest discomfort is described as the discomfort mainly on the right side of the chest, nonradiating been present for days now.  Patient is a type I diabetic. She is not aware of her A1c.  No fever, cough, colds, abdominal pain, PND, orthopnea, edema   ROS:  Please see the history of present illness. Aside from mentioned under HPI, all other systems are reviewed and negative.     Past Medical History:  Diagnosis Date  . Anxiety   . Diabetes mellitus without complication (HCC)   . GERD (gastroesophageal reflux disease)     Past Surgical History:  Procedure Laterality Date  . INCISION AND DRAINAGE    . PILONIDAL CYST EXCISION  12/03/2014   Procedure: CYST EXCISION PILONIDAL EXTENSIVE;  Surgeon: Duwaine MaxinWilliam Marterre, MD;  Location: [facility] ORS;  Service: General;;     reports that she has quit smoking. Her smoking use included Cigarettes. She does not have any smokeless tobacco history on file. She reports that she drinks about 0.6 - 1.2 oz of alcohol per week .   Family history is unknown by patient. Father probably diagnosed with CAD in his 30s. Maternal grandmother likely has a pacemaker, patient is  unsure  Current Outpatient Prescriptions  Medication Sig Dispense Refill  . etonogestrel (NEXPLANON) 68 MG IMPL implant 1 each by Subdermal route once.    Marland Kitchen. HYDROcodone-acetaminophen (NORCO) 5-325 MG per tablet Take 1 tablet by mouth every 6 (six) hours as needed for moderate pain. 30 tablet 0  . insulin glargine (LANTUS) 100 UNIT/ML injection Inject 65 Units into the skin at bedtime.    . insulin lispro protamine-lispro (HUMALOG 75/25 MIX) (75-25) 100 UNIT/ML SUSP injection Inject 50 Units into the skin 2 (two) times daily.    Marland Kitchen. LORazepam (ATIVAN) 0.5 MG tablet Take 0.5 mg by mouth every 8 (eight) hours as needed for anxiety.    Marland Kitchen. PARoxetine (PAXIL) 10 MG tablet Take 1 tablet (10 mg total) by mouth at bedtime. 30 tablet 5   No current facility-administered medications for this visit.     Allergies: Sulfa antibiotics    PHYSICAL EXAM: VS:  There were no vitals taken for this visit. , There is no height or weight on file to calculate BMI. Wt Readings from Last 3 Encounters:  01/11/16 176 lb 8 oz (80.1 kg)    GENERAL:  well developed, well nourished, obese, not in acute distress HEENT: normocephalic, pink conjunctivae, anicteric sclerae, no xanthelasma, normal dentition, oropharynx clear NECK:  no neck vein engorgement, JVP normal, no hepatojugular reflux, carotid upstroke brisk and symmetric, no bruit, no thyromegaly, no lymphadenopathy LUNGS:  good respiratory effort, clear to auscultation bilaterally CV:  PMI not displaced, no  thrills, no lifts, S1 and S2 within normal limits, no palpable S3 or S4, no murmurs, no rubs, no gallops ABD:  Soft, nontender, nondistended, normoactive bowel sounds, no abdominal aortic bruit, no hepatomegaly, no splenomegaly MS: nontender back, no kyphosis, no scoliosis, no joint deformities EXT:  2+ DP/PT pulses, no edema, no varicosities, no cyanosis, no clubbing SKIN: warm, nondiaphoretic, normal turgor, no ulcers NEUROPSYCH: alert, oriented to person,  place, and time, sensory/motor grossly intact, normal mood, appropriate affect  Recent Labs: 01/27/2016: ALT 16; BUN 12; Creatinine, Ser 0.68; Platelets 280; Potassium 4.6; Sodium 136; TSH 0.712   Lipid Panel No results found for: CHOL, TRIG, HDL, CHOLHDL, VLDL, LDLCALC, LDLDIRECT   Other studies Reviewed:  EKG:  The ekg from 01/11/2016 was personally reviewed by me and it revealed sinus tachycardia, 108 BPM, nonspecific T-wave changes.  Additional studies/ records that were reviewed personally reviewed by me today include:  Echo 02/16/2016: Left ventricle: The cavity size was normal. Wall thickness was   normal. Systolic function was normal. The estimated ejection   fraction was in the range of 60% to 65%. Wall motion was normal;   there were no regional wall motion abnormalities. Left   ventricular diastolic function parameters were normal.  Impressions:  - Normal study.  Stress echo 03/01/2016: Stress ECG conclusions: There were no stress arrhythmias or   conduction abnormalities. The stress ECG was negative for   ischemia. - Staged echo: There was no echocardiographic evidence for   stress-induced ischemia.  Impressions:  - Normal study after maximal exercise. Sinus tachycardia at rest   (115) with peak heart rate 203 bpm at 9 min, achieving 10.7 mets,   103% of her max predicted heart rate.  Holter monitor 03/15/2016: Overall rhythm was sinus. Heart ranged from 75 to 145 bpm, with average of 106 bpm. 80% of HR is in tachycardia.  Longest RR interval is normal at 1.08s.  No high grade supraventricular ectopy: 2 isolated PACs, no true runs.  No high grade ventricular ectopy: 5 isolated PVCs.   No detected atrial fibrillation.   ASSESSMENT AND PLAN:  Palpitations Check TSH and free T4, plasma fractionated metanephrines, CBC, CMP. Recommend echocardiogram. Recommend Holter monitor.   Chest pain and shortness of breath  Chest pain is atypical  Resting  EKG is sinus tachycardia with nonspecific T-wave changes which may be related to the rate Recommend stress echocardiogram for further evaluation. Patient is diabetic, type I. Recommend to auscultate lungs and evaluate for wheezing at near peak to peak exercise or if patient starts complaining of shortness of breath or chest pain during stress test.  Current medicines are reviewed at length with the patient today.  The patient does not have concerns regarding medicines.  Labs/ tests ordered today include:  No orders of the defined types were placed in this encounter.   I had a lengthy and detailed discussion with the patient regarding diagnoses, prognosis, diagnostic options, treatment options .   I counseled the patient on importance of lifestyle modification including heart healthy diet, regular physical activity once cardiac workup was done   Disposition:   FU with undersigned after tests  Signed, Almond Lint, MD  03/27/2016 11:52 AM    [facility]

## 2016-04-03 ENCOUNTER — Ambulatory Visit: Payer: Self-pay | Admitting: Cardiology

## 2016-04-03 NOTE — Progress Notes (Signed)
Cardiology Office Note   Date:  04/04/2016   ID:  Dawn Ortiz, DOB [birth year], MRN [id number]  Referring Doctor:  Ashley MarinerGONZALEZ, CHRISTINA MARIE, MD   Cardiologist:   Almond LintAileen Jaziel Bennett, MD   Reason for consultation:  Chief Complaint  Patient presents with  . Other    Follow up from stress echo & holter monitor. "doing well."       History of Present Illness: Dawn Ortiz is a 25 y.o. female who presents Follow-up after testing  Her palpitations are the same as before. No recurrence of chest pain or shortness of breath.  Patient is a type I diabetic. She is not aware of her Latest A1c. Patient the process of seeing a new PCP.  No fever, cough, colds, abdominal pain, PND, orthopnea, edema   ROS:  Please see the history of present illness. Aside from mentioned under HPI, all other systems are reviewed and negative.     Past Medical History:  Diagnosis Date  . Anxiety   . Diabetes mellitus without complication (HCC)   . GERD (gastroesophageal reflux disease)     Past Surgical History:  Procedure Laterality Date  . INCISION AND DRAINAGE    . PILONIDAL CYST EXCISION  12/03/2014   Procedure: CYST EXCISION PILONIDAL EXTENSIVE;  Surgeon: Duwaine MaxinWilliam Marterre, MD;  Location: [facility] ORS;  Service: General;;     reports that she has quit smoking. Her smoking use included Cigarettes. She has never used smokeless tobacco. She reports that she drinks about 0.6 - 1.2 oz of alcohol per week .   Family history is unknown by patient. Father probably diagnosed with CAD in his 30s. Maternal grandmother likely has a pacemaker, patient is unsure  Current Outpatient Prescriptions  Medication Sig Dispense Refill  . etonogestrel (NEXPLANON) 68 MG IMPL implant 1 each by Subdermal route once.    Marland Kitchen. HYDROcodone-acetaminophen (NORCO) 5-325 MG per tablet Take 1 tablet by mouth every 6 (six) hours as needed for moderate pain. 30 tablet 0  . insulin glargine (LANTUS) 100 UNIT/ML injection Inject 65  Units into the skin at bedtime.    . insulin lispro protamine-lispro (HUMALOG 75/25 MIX) (75-25) 100 UNIT/ML SUSP injection Inject 50 Units into the skin 2 (two) times daily.    Marland Kitchen. LORazepam (ATIVAN) 0.5 MG tablet Take 0.5 mg by mouth every 8 (eight) hours as needed for anxiety.    Marland Kitchen. PARoxetine (PAXIL) 10 MG tablet Take 1 tablet (10 mg total) by mouth at bedtime. 30 tablet 5   No current facility-administered medications for this visit.     Allergies: Sulfa antibiotics    PHYSICAL EXAM: VS:  BP 112/72 (BP Location: Left Arm, Patient Position: Sitting, Cuff Size: Normal)   Pulse (!) 106   Ht 5' 1.5" (1.562 m)   Wt 176 lb 8 oz (80.1 kg)   BMI 32.81 kg/m  , Body mass index is 32.81 kg/m. Wt Readings from Last 3 Encounters:  04/04/16 176 lb 8 oz (80.1 kg)  01/11/16 176 lb 8 oz (80.1 kg)    GENERAL:  well developed, well nourished, obese, not in acute distress HEENT: normocephalic, pink conjunctivae, anicteric sclerae, no xanthelasma, normal dentition, oropharynx clear NECK:  no neck vein engorgement, JVP normal, no hepatojugular reflux, carotid upstroke brisk and symmetric, no bruit, no thyromegaly, no lymphadenopathy LUNGS:  good respiratory effort, clear to auscultation bilaterally CV:  PMI not displaced, no thrills, no lifts, S1 and S2 within normal limits, no palpable S3 or S4, no murmurs,  no rubs, no gallops ABD:  Soft, nontender, nondistended, normoactive bowel sounds, no abdominal aortic bruit, no hepatomegaly, no splenomegaly MS: nontender back, no kyphosis, no scoliosis, no joint deformities EXT:  2+ DP/PT pulses, no edema, no varicosities, no cyanosis, no clubbing SKIN: warm, nondiaphoretic, normal turgor, no ulcers NEUROPSYCH: alert, oriented to person, place, and time, sensory/motor grossly intact, normal mood, appropriate affect  Recent Labs: 01/27/2016: ALT 16; BUN 12; Creatinine, Ser 0.68; Platelets 280; Potassium 4.6; Sodium 136; TSH 0.712   Lipid Panel No results  found for: CHOL, TRIG, HDL, CHOLHDL, VLDL, LDLCALC, LDLDIRECT   Other studies Reviewed:  EKG:  The ekg from 01/11/2016 was personally reviewed by me and it revealed sinus tachycardia, 108 BPM, nonspecific T-wave changes.  Additional studies/ records that were reviewed personally reviewed by me today include: Echo 02/24/2016: Left ventricle: The cavity size was normal. Wall thickness was   normal. Systolic function was normal. The estimated ejection   fraction was in the range of 60% to 65%. Wall motion was normal;   there were no regional wall motion abnormalities. Left   ventricular diastolic function parameters were normal.   Impressions:   - Normal study.  Stress echocardiogram 03/01/2016: Stress ECG conclusions: There were no stress arrhythmias or   conduction abnormalities. The stress ECG was negative for   ischemia. - Staged echo: There was no echocardiographic evidence for   stress-induced ischemia.   Impressions:   - Normal study after maximal exercise. Sinus tachycardia at rest   (115) with peak heart rate 203 bpm at 9 min, achieving 10.7 mets,   103% of her max predicted heart rate.  Holter monitor 03/15/2016: Overall rhythm was sinus. Heart ranged from 75 to 145 bpm, with average of 106 bpm. 80% of HR is in tachycardia.   Longest RR interval is normal at 1.08s.   No high grade supraventricular ectopy: 2 isolated PACs, no true runs.   No high grade ventricular ectopy: 5 isolated PVCs.    No detected atrial fibrillation.      ASSESSMENT AND PLAN:  Palpitations Check TSH and free T4, plasma fractionated metanephrines, CBC, CMP. - Within normal limits from 01/27/2016 Echocardiogram revealed normal heart function. Holter revealed elevated average heart rate. Sinus rhythm overall. No significant arrhythmia. No significant ectopy.  Likely, her elevated resting heart it is related to uncontrolled diabetes. Patient is going to see a new PCP. Also recommended that  she establish care with diabetes specialist With discussed possible treatment with beta blocker or any rate controlling medication. We discussed that most likely, possibility of side effects and intolerance with her blood pressure being on the low side, is much greater than overall benefit. Emphasize that there is no abnormal rhythm, her rhythm remains to be sinus.  Chest pain and shortness of breath  Chest pain is atypical  Resting EKG is sinus tachycardia with nonspecific T-wave changes which may be related to the rate No evidence of ischemia on stress echocardiogram. Likelihood of clinically significant CAD is very low. Patient reassured. Patient to follow-up with PCP.  Current medicines are reviewed at length with the patient today.  The patient does not have concerns regarding medicines.  Labs/ tests ordered today include:  No orders of the defined types were placed in this encounter.   I had a lengthy and detailed discussion with the patient regarding diagnoses, prognosis, diagnostic options, treatment options .   I counseled the patient on importance of lifestyle modification including heart healthy diet, regular physical activity.  Disposition:   FU with undersigned when necessary  I spent at least 25 minutes with the patient today and more than 50% of the time was spent counseling the patient and coordinating care.       Signed, Almond Lint, MD  04/04/2016 10:57 AM    [facility]

## 2016-04-04 ENCOUNTER — Ambulatory Visit (INDEPENDENT_AMBULATORY_CARE_PROVIDER_SITE_OTHER): Payer: Managed Care, Other (non HMO) | Admitting: Cardiology

## 2016-04-04 ENCOUNTER — Encounter: Payer: Self-pay | Admitting: Cardiology

## 2016-04-04 VITALS — BP 112/72 | HR 106 | Ht 61.5 in | Wt 176.5 lb

## 2016-04-04 DIAGNOSIS — R000 Tachycardia, unspecified: Secondary | ICD-10-CM

## 2016-04-04 NOTE — Patient Instructions (Signed)
Follow-Up: Your physician recommends that you schedule a follow-up appointment as needed.   Please establish care with a primary care physician and a diabetes specialist.  It was a pleasure seeing you today here in the office. Please do not hesitate to give us a call back if you have any further questions. [phone]  Coeburn CellarPamela A. RN, BSN

## 2016-06-14 ENCOUNTER — Ambulatory Visit: Payer: Self-pay | Admitting: Physician Assistant

## 2016-06-14 VITALS — BP 106/80 | HR 84 | Temp 98.9°F

## 2016-06-14 DIAGNOSIS — Z0289 Encounter for other administrative examinations: Secondary | ICD-10-CM

## 2016-06-14 NOTE — Progress Notes (Signed)
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

## 2016-07-02 ENCOUNTER — Encounter: Payer: Self-pay | Admitting: Physician Assistant

## 2016-07-02 ENCOUNTER — Encounter: Payer: Self-pay | Admitting: General Surgery

## 2016-07-02 ENCOUNTER — Observation Stay
Admission: AD | Admit: 2016-07-02 | Discharge: 2016-07-04 | Disposition: A | Discharge status: Home / Self Care | Payer: Managed Care, Other (non HMO) | Source: Ambulatory Visit | Attending: General Surgery | Admitting: General Surgery

## 2016-07-02 ENCOUNTER — Ambulatory Visit (INDEPENDENT_AMBULATORY_CARE_PROVIDER_SITE_OTHER): Payer: Managed Care, Other (non HMO) | Admitting: General Surgery

## 2016-07-02 ENCOUNTER — Ambulatory Visit: Payer: Self-pay | Admitting: Physician Assistant

## 2016-07-02 ENCOUNTER — Observation Stay: Payer: Managed Care, Other (non HMO) | Admitting: Anesthesiology

## 2016-07-02 ENCOUNTER — Encounter: Payer: Self-pay | Admitting: Anesthesiology

## 2016-07-02 ENCOUNTER — Encounter
Admission: AD | Disposition: A | Discharge status: Home / Self Care | Payer: Self-pay | Source: Ambulatory Visit | Attending: General Surgery

## 2016-07-02 VITALS — BP 120/60 | HR 118 | Temp 98.5°F

## 2016-07-02 VITALS — BP 151/88 | HR 117 | Temp 98.3°F | Ht 61.0 in | Wt 184.0 lb

## 2016-07-02 DIAGNOSIS — L02419 Cutaneous abscess of limb, unspecified: Secondary | ICD-10-CM

## 2016-07-02 DIAGNOSIS — Z9889 Other specified postprocedural states: Secondary | ICD-10-CM | POA: Insufficient documentation

## 2016-07-02 DIAGNOSIS — K219 Gastro-esophageal reflux disease without esophagitis: Secondary | ICD-10-CM | POA: Insufficient documentation

## 2016-07-02 DIAGNOSIS — E119 Type 2 diabetes mellitus without complications: Secondary | ICD-10-CM | POA: Diagnosis not present

## 2016-07-02 DIAGNOSIS — L02412 Cutaneous abscess of left axilla: Secondary | ICD-10-CM

## 2016-07-02 DIAGNOSIS — F419 Anxiety disorder, unspecified: Secondary | ICD-10-CM | POA: Diagnosis not present

## 2016-07-02 DIAGNOSIS — Z882 Allergy status to sulfonamides status: Secondary | ICD-10-CM | POA: Insufficient documentation

## 2016-07-02 DIAGNOSIS — Z87891 Personal history of nicotine dependence: Secondary | ICD-10-CM | POA: Diagnosis not present

## 2016-07-02 DIAGNOSIS — Z01810 Encounter for preprocedural cardiovascular examination: Secondary | ICD-10-CM

## 2016-07-02 DIAGNOSIS — Z6833 Body mass index (BMI) 33.0-33.9, adult: Secondary | ICD-10-CM | POA: Diagnosis not present

## 2016-07-02 DIAGNOSIS — B9689 Other specified bacterial agents as the cause of diseases classified elsewhere: Secondary | ICD-10-CM | POA: Diagnosis not present

## 2016-07-02 DIAGNOSIS — Z79899 Other long term (current) drug therapy: Secondary | ICD-10-CM | POA: Diagnosis not present

## 2016-07-02 DIAGNOSIS — E669 Obesity, unspecified: Secondary | ICD-10-CM | POA: Insufficient documentation

## 2016-07-02 DIAGNOSIS — Z793 Long term (current) use of hormonal contraceptives: Secondary | ICD-10-CM | POA: Diagnosis not present

## 2016-07-02 DIAGNOSIS — Z794 Long term (current) use of insulin: Secondary | ICD-10-CM | POA: Diagnosis not present

## 2016-07-02 HISTORY — DX: Cutaneous abscess of limb, unspecified: L02.419

## 2016-07-02 HISTORY — DX: Cutaneous abscess of left axilla: L02.412

## 2016-07-02 HISTORY — PX: INCISION AND DRAINAGE ABSCESS: SHX5864

## 2016-07-02 LAB — BASIC METABOLIC PANEL
Anion gap: 9 (ref 5–15)
BUN: 9 mg/dL (ref 6–20)
CALCIUM: 9.3 mg/dL (ref 8.9–10.3)
CO2: 21 mmol/L — AB (ref 22–32)
Chloride: 107 mmol/L (ref 101–111)
Creatinine, Ser: 0.63 mg/dL (ref 0.44–1.00)
GFR calc Af Amer: >60 mL/min (ref >60)
GLUCOSE: 272 mg/dL — AB (ref 65–99)
Potassium: 3.6 mmol/L (ref 3.5–5.1)
Sodium: 137 mmol/L (ref 135–145)

## 2016-07-02 LAB — CBC WITH DIFFERENTIAL/PLATELET
BASOS ABS: 0 10*3/uL (ref 0–0.1)
BASOS PCT: 0 %
EOS PCT: 0 %
Eosinophils Absolute: 0 10*3/uL (ref 0–0.7)
HEMATOCRIT: 35.8 % (ref 35.0–47.0)
Hemoglobin: 11.9 g/dL — ABNORMAL LOW (ref 12.0–16.0)
LYMPHS PCT: 11 %
Lymphs Abs: 1.6 10*3/uL (ref 1.0–3.6)
MCH: 25.6 pg — ABNORMAL LOW (ref 26.0–34.0)
MCHC: 33.2 g/dL (ref 32.0–36.0)
MCV: 77.1 fL — ABNORMAL LOW (ref 80.0–100.0)
MONO ABS: 1.2 10*3/uL — AB (ref 0.2–0.9)
MONOS PCT: 8 %
Neutro Abs: 11.2 10*3/uL — ABNORMAL HIGH (ref 1.4–6.5)
Neutrophils Relative %: 81 %
PLATELETS: 314 10*3/uL (ref 150–440)
RBC: 4.64 MIL/uL (ref 3.80–5.20)
RDW: 15.3 % — AB (ref 11.5–14.5)
WBC: 14 10*3/uL — ABNORMAL HIGH (ref 3.6–11.0)

## 2016-07-02 LAB — GLUCOSE, CAPILLARY
Glucose-Capillary: 211 mg/dL — ABNORMAL HIGH (ref 65–99)
Glucose-Capillary: 253 mg/dL — ABNORMAL HIGH (ref 65–99)

## 2016-07-02 LAB — APTT: APTT: 31 s (ref 24–36)

## 2016-07-02 LAB — PROTIME-INR
INR: 1.12
Prothrombin Time: 14.5 seconds (ref 11.4–15.2)

## 2016-07-02 LAB — MRSA PCR SCREENING

## 2016-07-02 LAB — PREGNANCY, URINE: Preg Test, Ur: NEGATIVE

## 2016-07-02 SURGERY — INCISION AND DRAINAGE, ABSCESS
Anesthesia: General | Laterality: Left

## 2016-07-02 MED ORDER — INSULIN DETEMIR 100 UNIT/ML ~~LOC~~ SOLN
50.0000 [IU] | Freq: Two times a day (BID) | SUBCUTANEOUS | Status: DC
  Administered 2016-07-02 – 2016-07-04 (×4): 50 [IU] via SUBCUTANEOUS
  Filled 2016-07-02 (×5): qty 0.5

## 2016-07-02 MED ORDER — ETONOGESTREL 68 MG ~~LOC~~ IMPL: 1.0000 | DRUG_IMPLANT | Freq: Once | SUBCUTANEOUS | Status: DC

## 2016-07-02 MED ORDER — INSULIN ASPART 100 UNIT/ML ~~LOC~~ SOLN
0.0000 [IU] | Freq: Every day | SUBCUTANEOUS | Status: DC
  Administered 2016-07-02: 3 [IU] via SUBCUTANEOUS
  Filled 2016-07-02: qty 3

## 2016-07-02 MED ORDER — PAROXETINE HCL 20 MG PO TABS
10.0000 mg | ORAL_TABLET | Freq: Every day | ORAL | Status: DC
  Administered 2016-07-02 – 2016-07-03 (×2): 10 mg via ORAL
  Filled 2016-07-02 (×2): qty 1

## 2016-07-02 MED ORDER — MORPHINE SULFATE (PF) 4 MG/ML IV SOLN
4.0000 mg | INTRAVENOUS | Status: DC | PRN
  Administered 2016-07-02 – 2016-07-03 (×3): 4 mg via INTRAVENOUS
  Filled 2016-07-02 (×3): qty 1

## 2016-07-02 MED ORDER — MIDAZOLAM HCL 2 MG/2ML IJ SOLN
INTRAMUSCULAR | Status: DC | PRN
  Administered 2016-07-02: 2 mg via INTRAVENOUS

## 2016-07-02 MED ORDER — PROPOFOL 10 MG/ML IV BOLUS
INTRAVENOUS | Status: DC | PRN
  Administered 2016-07-02: 160 mg via INTRAVENOUS

## 2016-07-02 MED ORDER — INSULIN ASPART 100 UNIT/ML ~~LOC~~ SOLN
0.0000 [IU] | Freq: Three times a day (TID) | SUBCUTANEOUS | Status: DC
  Administered 2016-07-03: 2 [IU] via SUBCUTANEOUS
  Administered 2016-07-03 – 2016-07-04 (×2): 1 [IU] via SUBCUTANEOUS
  Filled 2016-07-02: qty 1
  Filled 2016-07-02: qty 2
  Filled 2016-07-02: qty 1

## 2016-07-02 MED ORDER — FENTANYL CITRATE (PF) 100 MCG/2ML IJ SOLN
INTRAMUSCULAR | Status: AC
  Administered 2016-07-02: 25 ug via INTRAVENOUS
  Filled 2016-07-02: qty 2

## 2016-07-02 MED ORDER — LACTATED RINGERS IV SOLN
INTRAVENOUS | Status: DC
  Administered 2016-07-02 – 2016-07-03 (×3): via INTRAVENOUS

## 2016-07-02 MED ORDER — MENTHOL 3 MG MT LOZG
1.0000 | LOZENGE | OROMUCOSAL | Status: DC | PRN
  Administered 2016-07-02: 3 mg via ORAL
  Filled 2016-07-02: qty 9

## 2016-07-02 MED ORDER — CHLORHEXIDINE GLUCONATE CLOTH 2 % EX PADS: 6.0000 | MEDICATED_PAD | Freq: Once | CUTANEOUS | Status: DC

## 2016-07-02 MED ORDER — FENTANYL CITRATE (PF) 100 MCG/2ML IJ SOLN
INTRAMUSCULAR | Status: DC | PRN
  Administered 2016-07-02: 50 ug via INTRAVENOUS
  Administered 2016-07-02: 100 ug via INTRAVENOUS

## 2016-07-02 MED ORDER — DIPHENHYDRAMINE HCL 25 MG PO CAPS: 25.0000 mg | ORAL_CAPSULE | Freq: Four times a day (QID) | ORAL | Status: DC | PRN

## 2016-07-02 MED ORDER — ONDANSETRON HCL 4 MG/2ML IJ SOLN: 4.0000 mg | Freq: Four times a day (QID) | INTRAMUSCULAR | Status: DC | PRN

## 2016-07-02 MED ORDER — LISINOPRIL 5 MG PO TABS
2.5000 mg | ORAL_TABLET | Freq: Every day | ORAL | Status: DC
  Administered 2016-07-02 – 2016-07-04 (×3): 2.5 mg via ORAL
  Filled 2016-07-02 (×3): qty 1

## 2016-07-02 MED ORDER — INSULIN ASPART 100 UNIT/ML ~~LOC~~ SOLN
25.0000 [IU] | Freq: Three times a day (TID) | SUBCUTANEOUS | Status: DC
  Administered 2016-07-03 – 2016-07-04 (×4): 25 [IU] via SUBCUTANEOUS
  Filled 2016-07-02 (×4): qty 25

## 2016-07-02 MED ORDER — HYDROCODONE-ACETAMINOPHEN 5-325 MG PO TABS
1.0000 | ORAL_TABLET | Freq: Four times a day (QID) | ORAL | Status: DC | PRN
  Administered 2016-07-02 – 2016-07-03 (×2): 1 via ORAL
  Filled 2016-07-02 (×2): qty 1

## 2016-07-02 MED ORDER — DIPHENHYDRAMINE HCL 50 MG/ML IJ SOLN: 25.0000 mg | Freq: Four times a day (QID) | INTRAMUSCULAR | Status: DC | PRN

## 2016-07-02 MED ORDER — VANCOMYCIN HCL IN DEXTROSE 1-5 GM/200ML-% IV SOLN
1000.0000 mg | Freq: Once | INTRAVENOUS | Status: AC
  Administered 2016-07-02: 1000 mg via INTRAVENOUS
  Filled 2016-07-02: qty 200

## 2016-07-02 MED ORDER — ONDANSETRON 4 MG PO TBDP: 4.0000 mg | ORAL_TABLET | Freq: Four times a day (QID) | ORAL | Status: DC | PRN

## 2016-07-02 MED ORDER — SODIUM CHLORIDE 0.9 % IV SOLN
3.0000 g | Freq: Four times a day (QID) | INTRAVENOUS | Status: DC
  Administered 2016-07-02 – 2016-07-04 (×7): 3 g via INTRAVENOUS
  Filled 2016-07-02 (×11): qty 3

## 2016-07-02 MED ORDER — LACTATED RINGERS IV BOLUS (SEPSIS)
1000.0000 mL | Freq: Once | INTRAVENOUS | Status: AC
  Administered 2016-07-02: 1000 mL via INTRAVENOUS

## 2016-07-02 MED ORDER — FENTANYL CITRATE (PF) 100 MCG/2ML IJ SOLN
25.0000 ug | INTRAMUSCULAR | Status: AC | PRN
  Administered 2016-07-02 (×6): 25 ug via INTRAVENOUS

## 2016-07-02 MED ORDER — ONDANSETRON HCL 4 MG/2ML IJ SOLN: 4.0000 mg | Freq: Once | INTRAMUSCULAR | Status: DC | PRN

## 2016-07-02 MED ORDER — LIDOCAINE HCL (CARDIAC) 20 MG/ML IV SOLN
INTRAVENOUS | Status: DC | PRN
  Administered 2016-07-02: 30 mg via INTRAVENOUS

## 2016-07-02 SURGICAL SUPPLY — 19 items
BNDG GAUZE 4.5X4.1 6PLY STRL (MISCELLANEOUS) IMPLANT
CHLORAPREP W/TINT 26ML (MISCELLANEOUS) ×2 IMPLANT
DRAIN PENROSE 1/4X12 LTX (DRAIN) ×2 IMPLANT
DRAPE LAPAROTOMY TRNSV 106X77 (MISCELLANEOUS) ×2 IMPLANT
ELECT REM PT RETURN 9FT ADLT (ELECTROSURGICAL) ×2
ELECTRODE REM PT RTRN 9FT ADLT (ELECTROSURGICAL) ×1 IMPLANT
GAUZE SPONGE 4X4 12PLY STRL (GAUZE/BANDAGES/DRESSINGS) ×2 IMPLANT
GLOVE BIO SURGEON STRL SZ8 (GLOVE) ×6 IMPLANT
GOWN STRL REUS W/ TWL LRG LVL3 (GOWN DISPOSABLE) ×2 IMPLANT
GOWN STRL REUS W/TWL LRG LVL3 (GOWN DISPOSABLE) ×2
KIT RM TURNOVER STRD PROC AR (KITS) ×2 IMPLANT
LABEL OR SOLS (LABEL) IMPLANT
NS IRRIG 500ML POUR BTL (IV SOLUTION) ×2 IMPLANT
PACK EXTREMITY ARMC (MISCELLANEOUS) ×2 IMPLANT
PAD ABD DERMACEA PRESS 5X9 (GAUZE/BANDAGES/DRESSINGS) ×2 IMPLANT
SPONGE LAP 18X18 5 PK (GAUZE/BANDAGES/DRESSINGS) ×2 IMPLANT
STOCKINETTE STRL 4IN 9604848 (GAUZE/BANDAGES/DRESSINGS) IMPLANT
SUT ETHILON 3-0 KS 30 BLK (SUTURE) ×2 IMPLANT
SWAB CULTURE AMIES ANAERIB BLU (MISCELLANEOUS) ×2 IMPLANT

## 2016-07-02 NOTE — Progress Notes (Signed)
Chest x-ray ordered for midnight.  Order clarified with dr pabon.  I did inform dr pabon that the patient was having chest pain earlier today and just a slight bit now.  EKG has already been done

## 2016-07-02 NOTE — Progress Notes (Signed)
Patient just left for the OR via bed by OR orderly

## 2016-07-02 NOTE — Op Note (Signed)
07/02/2016  8:13 PM  PATIENT:  Arnoldo LenisMyra J Trull  25 y.o. female  PRE-OPERATIVE DIAGNOSIS:  Left axillary abscess  POST-OPERATIVE DIAGNOSIS:  Same  PROCEDURE: Incision and drainage of left axillary abscess  SURGEON:  Lattie Hawichard E Cooper MD, FACS   ANESTHESIA:  Gen. with LMA   Details of Procedure: This a patient with a left axillary abscess she is diabetic and tachycardic. She requires incision and drainage under general anesthesia. Preoperatively discussed rationale for surgery the options of observation risk bleeding infection recurrence drain placement. She and her family understood and agreed to proceed.  Patient was induced to general anesthesia prepped and draped sterile fashion the left shoulder had been previously marked and a surgical cause was performed.  The fluctuant tender area in the left axilla anteriorly was identified further examination axilla failed to identify any additional concerns or problems of infection. Therefore this point incision was performed anteriorly and a large amount of purulence exuded cultures were obtained and a Yankauer sucker tip was placed into the cavity. A counter incision posterior and dependent was performed irrigation was performed until clear and then a Penrose drain was brought in through one incision and out through the other tied in with 3-0 nylon. No further drainage was necessary therefore sterile dressings were placed.  Patient out of the procedure well the workup occasions she was taken to recovery room in stable continue condition to be admitted for continued care and IV antibiotics.  Sponge lap needle count was correct   Lattie Hawichard E Cooper, MD FACS

## 2016-07-02 NOTE — H&P (Signed)
CC: Axillary abscess  HPI Dawn Ortiz is a 25 y.o. female who presents to clinic today for evaluation of an axillary abscess. Patient is Insulin dependent Dm Severe pain on left axilla. Se rest of HPI er Dr. Tonita CongWoodham. HPI      Past Medical History:  Diagnosis Date  . Anxiety   . Diabetes mellitus without complication (HCC)   . GERD (gastroesophageal reflux disease)          Past Surgical History:  Procedure Laterality Date  . INCISION AND DRAINAGE    . PILONIDAL CYST EXCISION  12/03/2014   Procedure: CYST EXCISION PILONIDAL EXTENSIVE;  Surgeon: Duwaine MaxinWilliam Marterre, MD;  Location: [facility] ORS;  Service: General;;         Family History  Problem Relation Age of Onset  . Healthy Mother     Social History        Social History  Substance Use Topics  . Smoking status: Former Smoker    Types: Cigarettes  . Smokeless tobacco: Never Used  . Alcohol use 0.6 - 1.2 oz/week     1 - 2 Glasses of wine per week      Comment: ocassional        Allergies  Allergen Reactions  . Sulfa Antibiotics Hives          Current Outpatient Prescriptions  Medication Sig Dispense Refill  . cephALEXin (KEFLEX) 500 MG capsule TK ONE C PO TID FOR 10 DAYS  0  . etonogestrel (NEXPLANON) 68 MG IMPL implant 1 each by Subdermal route once.    . fluconazole (DIFLUCAN) 150 MG tablet TK 1 T PO PRN FOR YEAST INFECTION  1  . HYDROcodone-acetaminophen (NORCO) 5-325 MG per tablet Take 1 tablet by mouth every 6 (six) hours as needed for moderate pain. 30 tablet 0  . insulin glargine (LANTUS) 100 UNIT/ML injection Inject 65 Units into the skin at bedtime.    . insulin lispro protamine-lispro (HUMALOG 75/25 MIX) (75-25) 100 UNIT/ML SUSP injection Inject 50 Units into the skin 2 (two) times daily.    Marland Kitchen. lisinopril (PRINIVIL,ZESTRIL) 2.5 MG tablet Take 2.5 mg by mouth daily.    Marland Kitchen. PARoxetine (PAXIL) 10 MG tablet Take 1 tablet (10 mg total) by mouth at bedtime. 30 tablet 5   No  current facility-administered medications for this visit.      Review of Systems A Multi-point review of systems was asked and was negative except for the findings documented in the history of present illness  Physical Exam CONSTITUTIONAL: No acute distress. LYMPH NODES:  Lymph nodes in the neck are normal. RESPIRATORY:  Lungs are clear. There is normal respiratory effort, with equal breath sounds bilaterally, and without pathologic use of accessory muscles. CARDIOVASCULAR: Heart is regular without murmurs, gallops, or rubs. GI: The abdomen is soft, nontender, and nondistended. There are no palpable masses. There is no hepatosplenomegaly. There are normal bowel sounds in all quadrants. GU: Rectal deferred.   MUSCULOSKELETAL: Normal muscle strength and tone. No cyanosis or edema.   SKIN: Her left axilla on exam is exquisitely tender to palpation and obviously swollen on the most anterior aspect. The tenderness goes from her pectoralis muscle to her latissimus. She is unable to raise her arm to fully show the complete area of induration. NEUROLOGIC: Motor and sensation is grossly normal. Cranial nerves are grossly intact. PSYCH:  Oriented to person, place and time. Affect is normal.  Data Reviewed No imaging or labs reviewed for this I have personally reviewed the  patient's imaging, laboratory findings and medical records.    Assessment/ Plan    25 year old female with an abscess to her left axilla. In need for I/D in the OR. D/W the pt in detail about the procedure, riks, benefits and possible complications. She had some food at 11am so we will get Dr. Excell Seltzerooper to do it tonight. D/ w and family in detail

## 2016-07-02 NOTE — Progress Notes (Signed)
Patient ID: Dawn Ortiz, female   DOB: Nov 02, 1990, 25 y.o.   MRN: [id number]  CC: Axillary abscess  HPI Dawn Ortiz is a 25 y.o. female who presents to clinic today for evaluation of an axillary abscess. Patient reports that over the last couple days he's had increasingly tender and painful area under her left axilla. Today is worsened to the point that it is wrapped around to her back and she can no longer raise her left arm secondary to pain. She's had numerous infections in the past and has been told she's had hidradenitis in the past however this is different. She has been not feeling well but she denies any fevers or chills. Her primary complaint is of pain to the axilla along with swelling. She denies any other chest pain, shortness of breath, diarrhea, constipation.  HPI  Past Medical History:  Diagnosis Date  . Anxiety   . Diabetes mellitus without complication ([place])   . GERD (gastroesophageal reflux disease)     Past Surgical History:  Procedure Laterality Date  . INCISION AND DRAINAGE    . PILONIDAL CYST EXCISION  12/03/2014   Procedure: CYST EXCISION PILONIDAL EXTENSIVE;  Surgeon: Molly Maduro, MD;  Location: [facility] ORS;  Service: General;;    Family History  Problem Relation Age of Onset  . Healthy Mother     Social History Social History  Substance Use Topics  . Smoking status: Former Smoker    Types: Cigarettes  . Smokeless tobacco: Never Used  . Alcohol use 0.6 - 1.2 oz/week    1 - 2 Glasses of wine per week     Comment: ocassional    Allergies  Allergen Reactions  . Sulfa Antibiotics Hives    Current Outpatient Prescriptions  Medication Sig Dispense Refill  . cephALEXin (KEFLEX) 500 MG capsule TK ONE C PO TID FOR 10 DAYS  0  . etonogestrel (NEXPLANON) 68 MG IMPL implant 1 each by Subdermal route once.    . fluconazole (DIFLUCAN) 150 MG tablet TK 1 T PO PRN FOR YEAST INFECTION  1  . HYDROcodone-acetaminophen (NORCO) 5-325 MG per tablet Take 1  tablet by mouth every 6 (six) hours as needed for moderate pain. 30 tablet 0  . insulin glargine (LANTUS) 100 UNIT/ML injection Inject 65 Units into the skin at bedtime.    . insulin lispro protamine-lispro (HUMALOG 75/25 MIX) (75-25) 100 UNIT/ML SUSP injection Inject 50 Units into the skin 2 (two) times daily.    Marland Kitchen lisinopril (PRINIVIL,ZESTRIL) 2.5 MG tablet Take 2.5 mg by mouth daily.    Marland Kitchen PARoxetine (PAXIL) 10 MG tablet Take 1 tablet (10 mg total) by mouth at bedtime. 30 tablet 5   No current facility-administered medications for this visit.      Review of Systems A Multi-point review of systems was asked and was negative except for the findings documented in the history of present illness  Physical Exam Blood pressure (!) 151/88, pulse (!) 117, temperature 98.3 F (36.8 C), temperature source Oral, height _0  (1.549 m), weight 83.5 kg (184 lb). CONSTITUTIONAL: No acute distress. EYES: Pupils are equal, round, and reactive to light, Sclera are non-icteric. EARS, NOSE, MOUTH AND THROAT: The oropharynx is clear. The oral mucosa is pink and moist. Hearing is intact to voice. LYMPH NODES:  Lymph nodes in the neck are normal. RESPIRATORY:  Lungs are clear. There is normal respiratory effort, with equal breath sounds bilaterally, and without pathologic use of accessory muscles. CARDIOVASCULAR: Heart is regular without  murmurs, gallops, or rubs. GI: The abdomen is soft, nontender, and nondistended. There are no palpable masses. There is no hepatosplenomegaly. There are normal bowel sounds in all quadrants. GU: Rectal deferred.   MUSCULOSKELETAL: Normal muscle strength and tone. No cyanosis or edema.   SKIN: Her left axilla on exam is exquisitely tender to palpation and obviously swollen on the most anterior aspect. The tenderness goes from her pectoralis muscle to her latissimus. She is unable to raise her arm to fully show the complete area of induration. NEUROLOGIC: Motor and sensation is  grossly normal. Cranial nerves are grossly intact. PSYCH:  Oriented to person, place and time. Affect is normal.  Data Reviewed No imaging or labs reviewed for this I have personally reviewed the patient's imaging, laboratory findings and medical records.    Assessment    Left axillary abscess    Plan    25 year old female with an abscess to her left axilla. Given that the size and tenderness to the area discussed with the patient that we would likely require anesthesia to fully drain the area. She voiced understanding and appreciated the addition of anesthesia. She had something to eat just prior to coming to clinic so I counseled certain to not eat or drink anything further. I will direct admit her to the hospital under observation and she'll be met by one of my partners to perform an I&D of her left axilla later today under anesthesia. All questions answered to the patient's satisfaction.     Time spent with the patient was 45 minutes, with more than 50% of the time spent in face-to-face education, counseling and care coordination.     Clayburn Pert, MD FACS General Surgeon 07/02/2016, 1:26 PM

## 2016-07-02 NOTE — Progress Notes (Signed)
S: c/o large abscess in left axilla, was seen at her pcp on Friday and given keflex , now area is so large that she can't raise her arm or lie on her back due to pain, is having some pain in her chest also, denies fever/chills, has hx of DM- insulin dependent, also hx of abscesses in same area, had similar problems with ?pilonidal cyst where they eventually removed a gland  O: vitals w elevated HR, temp wnl, pt has difficulty raising left arm due to pain, large amount of swelling in left axilla that stretches from edge of breast to her back, area is fluctuant and extremely tender, pt unable to lie on her back due to pain in axillary area, lungs c t a, cv rrr  A: large abscess, failed outpatient medication therapy  P: concerns due to size of abscess and hx of diabetes, referred pt to surgery, is going there as soon as she leaves our office, is in stable condition at discharge

## 2016-07-02 NOTE — Anesthesia Preprocedure Evaluation (Addendum)
Anesthesia Evaluation  Patient identified by MRN, date of birth, ID band Patient awake    Reviewed: Allergy & Precautions, NPO status , Patient's Chart, lab work & pertinent test results, reviewed documented beta blocker date and time   Airway Mallampati: II  TM Distance: >3 FB     Dental  (+) Chipped   Pulmonary former smoker,           Cardiovascular      Neuro/Psych    GI/Hepatic   Endo/Other  diabetes  Renal/GU      Musculoskeletal   Abdominal   Peds  Hematology   Anesthesia Other Findings Obese. EKG ok, poor R wave prog, no cardiac symptoms. No further testing.  Reproductive/Obstetrics                            Anesthesia Physical Anesthesia Plan  ASA: II  Anesthesia Plan: General   Post-op Pain Management:    Induction: Intravenous  Airway Management Planned: Oral ETT and LMA  Additional Equipment:   Intra-op Plan:   Post-operative Plan:   Informed Consent: I have reviewed the patients History and Physical, chart, labs and discussed the procedure including the risks, benefits and alternatives for the proposed anesthesia with the patient or authorized representative who has indicated his/her understanding and acceptance.     Plan Discussed with: CRNA  Anesthesia Plan Comments:         Anesthesia Quick Evaluation

## 2016-07-02 NOTE — Progress Notes (Signed)
Pt HR 129, BP 118/70 after 1L bolus of LR. Dr. Excell Seltzerooper notified. Will continue to assess.

## 2016-07-02 NOTE — Progress Notes (Signed)
Preoperative Review   Patient is met in the preoperative holding area. The history is reviewed in the chart and with the patient. I personally reviewed the options and rationale as well as the risks of this procedure that have been previously discussed with the patient. All questions asked by the patient and/or family were answered to their satisfaction.  Patient not examined due to pain. Left shoulder marked.  Patient also states that the nurse asked me to look it something in her right medial thigh I did not see any sign of an abscess there but she does have signs of hidradenitis. No I&D planned for the thigh.  Family present risks reviewed in detail  Patient agrees to proceed with this procedure at this time.  Florene Glen M.D. FACS

## 2016-07-02 NOTE — Anesthesia Procedure Notes (Addendum)
Procedure Name: LMA Insertion Date/Time: 07/02/2016 7:47 PM Performed by: Waldo LaineJUSTIS, Shalicia Craghead Pre-anesthesia Checklist: Patient identified, Emergency Drugs available, Suction available, Patient being monitored and Timeout performed Patient Re-evaluated:Patient Re-evaluated prior to inductionOxygen Delivery Method: Circle system utilized Preoxygenation: Pre-oxygenation with 100% oxygen Intubation Type: IV induction Ventilation: Mask ventilation without difficulty LMA: LMA inserted LMA Size: 4.0 Placement Confirmation: positive ETCO2

## 2016-07-02 NOTE — Care Management Note (Signed)
Case Management Note  Patient Details  Name: Arnoldo LenisMyra J Baller MRN: [id number] Date of Birth: 1990-09-14  Subjective/Objective:   25yo from home with an axillary abscess.  Resides at home with her Father who can provide transportation if Ms Mayford KnifeWilliams is unable to drive herself. PCP= Dr Pat KocherNimalendran. Pharmacy= [facility] [address]. Ms Mayford KnifeWilliams reports no home oxygen, no home assistive equipment, not home health services. No home health services after discharge anticipated at this time. Case management will follow for discharge planning.                Action/Plan:   Expected Discharge Date:                  Expected Discharge Plan:  Home/Self Care  In-House Referral:  NA  Discharge planning Services  CM Consult  Post Acute Care Choice:  NA Choice offered to:  NA  DME Arranged:  N/A DME Agency:  NA  HH Arranged:  NA HH Agency:  NA  Status of Service:  In process, will continue to follow  If discussed at Long Length of Stay Meetings, dates discussed:    Additional Comments:  Noelle Sease A, RN 07/02/2016, 3:04 PM

## 2016-07-02 NOTE — Anesthesia Procedure Notes (Signed)
Performed by: Euline Kimbler       

## 2016-07-02 NOTE — Transfer of Care (Signed)
Immediate Anesthesia Transfer of Care Note  Patient: Dawn Ortiz  Procedure(s) Performed: Procedure(s): INCISION AND DRAINAGE ABSCESS (Left)  Patient Location: PACU  Anesthesia Type:General  Level of Consciousness: awake, alert , oriented and patient cooperative  Airway & Oxygen Therapy: Patient Spontanous Breathing  Post-op Assessment: Report given to RN and Post -op Vital signs reviewed and stable  Post vital signs: Reviewed and stable  Last Vitals:  Vitals:   07/02/16 1451 07/02/16 1600  BP: 136/86   Pulse: (!) 128 (!) 130  Resp: 18   Temp: 36.8 C     Last Pain:  Vitals:   07/02/16 1814  TempSrc:   PainSc: 2          Complications: No apparent anesthesia complications

## 2016-07-03 ENCOUNTER — Encounter: Payer: Self-pay | Admitting: Surgery

## 2016-07-03 DIAGNOSIS — L02412 Cutaneous abscess of left axilla: Secondary | ICD-10-CM | POA: Diagnosis not present

## 2016-07-03 LAB — GLUCOSE, CAPILLARY
GLUCOSE-CAPILLARY: 173 mg/dL — AB (ref 65–99)
GLUCOSE-CAPILLARY: 125 mg/dL — AB (ref 65–99)
GLUCOSE-CAPILLARY: 100 mg/dL — AB (ref 65–99)
Glucose-Capillary: 102 mg/dL — ABNORMAL HIGH (ref 65–99)

## 2016-07-03 MED ORDER — LACTATED RINGERS IV SOLN
INTRAVENOUS | Status: DC
  Administered 2016-07-03 – 2016-07-04 (×4): via INTRAVENOUS

## 2016-07-03 MED ORDER — KETOROLAC TROMETHAMINE 30 MG/ML IJ SOLN
30.0000 mg | Freq: Four times a day (QID) | INTRAMUSCULAR | Status: DC
  Administered 2016-07-03 – 2016-07-04 (×3): 30 mg via INTRAVENOUS
  Filled 2016-07-03 (×4): qty 1

## 2016-07-03 MED ORDER — ACETAMINOPHEN 325 MG PO TABS
650.0000 mg | ORAL_TABLET | ORAL | Status: DC | PRN
  Administered 2016-07-03 – 2016-07-04 (×2): 650 mg via ORAL
  Filled 2016-07-03 (×2): qty 2

## 2016-07-03 MED ORDER — HYDROCODONE-ACETAMINOPHEN 5-325 MG PO TABS: 1.0000 | ORAL_TABLET | Freq: Four times a day (QID) | ORAL | Status: DC | PRN

## 2016-07-03 NOTE — Progress Notes (Signed)
POD # 1  AVSS Still having pain NO growth from cultures so far MRSA screen neg  PE NAD Abd: soft, nt Axilla: induration and tenderness, no evidence of necrotizing infection, drain in place  A/P Large complex axillay abscess Continue IV a/b Likely dc in am  Tachycardia likely from systemic infection, placed on IVF( baseline HR 100-110s) Recheck labs in am

## 2016-07-03 NOTE — Anesthesia Postprocedure Evaluation (Signed)
Anesthesia Post Note  Patient: Arnoldo LenisMyra J Criger  Procedure(s) Performed: Procedure(s) (LRB): INCISION AND DRAINAGE ABSCESS (Left)  Patient location during evaluation: Endoscopy Anesthesia Type: General Level of consciousness: awake and alert Pain management: pain level controlled Vital Signs Assessment: post-procedure vital signs reviewed and stable Respiratory status: spontaneous breathing, nonlabored ventilation, respiratory function stable and patient connected to nasal cannula oxygen Cardiovascular status: blood pressure returned to baseline and stable Postop Assessment: no signs of nausea or vomiting Anesthetic complications: no    Last Vitals:  Vitals:   07/02/16 2341 07/02/16 2343  BP: 118/70   Pulse: (!) 129 (!) 131  Resp:    Temp:      Last Pain:  Vitals:   07/03/16 0049  TempSrc:   PainSc: Asleep                 Tamberly Pomplun S

## 2016-07-03 NOTE — Progress Notes (Signed)
Inpatient Diabetes Program Recommendations  AACE/ADA: New Consensus Statement on Inpatient Glycemic Control (2015)  Target Ranges:  Prepandial:   less than 140 mg/dL      Peak postprandial:   less than 180 mg/dL (1-2 hours)      Critically ill patients:  140 - 180 mg/dL   Review of Glycemic Control  Diabetes history: DM 1 Outpatient Diabetes medications: Levemir 50 units BID, Novolog 25 units TID Current orders for Inpatient glycemic control: Levemir 50 units BID, Novolog Sensitive + HS + Novolog 25 units TID meal coverage   Spoke with patient over the phone to verify home regimen. Patient is taking home regimen as listed above. Patient is managed by PCP with her DM management, and [facility]. Patient reports [facility] be closer. Spoke with patient about [facility]. Patient reports recently being  At her PCP last week but has not received her A1c results yet, however she does not think it would be controlled as she is seeing glucose levels from 150's-300's. 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 Will watch trends for today on home regimen.   Thanks,  Christena DeemShannon Tatayana Beshears RN, MSN, [facility] Inpatient Diabetes Coordinator Team Pager [phone] (8a-5p)

## 2016-07-04 DIAGNOSIS — L02412 Cutaneous abscess of left axilla: Secondary | ICD-10-CM | POA: Diagnosis not present

## 2016-07-04 LAB — CBC
HCT: 31.1 % — ABNORMAL LOW (ref 35.0–47.0)
Hemoglobin: 10.4 g/dL — ABNORMAL LOW (ref 12.0–16.0)
MCH: 26 pg (ref 26.0–34.0)
MCHC: 33.4 g/dL (ref 32.0–36.0)
MCV: 77.8 fL — ABNORMAL LOW (ref 80.0–100.0)
PLATELETS: 257 10*3/uL (ref 150–440)
RBC: 4 MIL/uL (ref 3.80–5.20)
RDW: 15 % — ABNORMAL HIGH (ref 11.5–14.5)
WBC: 10.2 10*3/uL (ref 3.6–11.0)

## 2016-07-04 LAB — BASIC METABOLIC PANEL
Anion gap: 8 (ref 5–15)
BUN: 8 mg/dL (ref 6–20)
CO2: 24 mmol/L (ref 22–32)
CREATININE: 0.55 mg/dL (ref 0.44–1.00)
Calcium: 8.6 mg/dL — ABNORMAL LOW (ref 8.9–10.3)
Chloride: 107 mmol/L (ref 101–111)
Glucose, Bld: 119 mg/dL — ABNORMAL HIGH (ref 65–99)
Potassium: 3.1 mmol/L — ABNORMAL LOW (ref 3.5–5.1)
SODIUM: 139 mmol/L (ref 135–145)

## 2016-07-04 LAB — GLUCOSE, CAPILLARY
GLUCOSE-CAPILLARY: 115 mg/dL — AB (ref 65–99)
GLUCOSE-CAPILLARY: 128 mg/dL — AB (ref 65–99)

## 2016-07-04 MED ORDER — AMOXICILLIN-POT CLAVULANATE 875-125 MG PO TABS: 1.0000 | ORAL_TABLET | Freq: Two times a day (BID) | ORAL | 0 refills | Status: DC

## 2016-07-04 MED ORDER — POTASSIUM CHLORIDE 20 MEQ PO PACK
40.0000 meq | PACK | Freq: Once | ORAL | Status: AC
  Administered 2016-07-04: 40 meq via ORAL
  Filled 2016-07-04: qty 2

## 2016-07-04 MED ORDER — HYDROCODONE-ACETAMINOPHEN 5-325 MG PO TABS: 1.0000 | ORAL_TABLET | Freq: Four times a day (QID) | ORAL | 0 refills | Status: DC | PRN

## 2016-07-04 NOTE — Progress Notes (Signed)
Patient alert and oriented, VSS.  Medicated x1 for pain with good relief.  Tolerating diet well.  Dressing changed per MD order.  Dressing change teaching provided to patient.  Understanding was verbalized and all questions were answered.  Work note provided to patient per MD order.  Patient discharged home in stable condition.

## 2016-07-04 NOTE — Discharge Summary (Signed)
Patient ID: Dawn LenisMyra J Giebler MRN: [id number] DOB/AGE: 01-29-1991 25 y.o.  Admit date: 07/02/2016 Discharge date: 07/04/2016   Discharge Diagnoses:  Active Problems:   Axillary abscess   Procedures:I/D left axilla  Hospital Course:  The 25 year old female with a history of obesity and diabetes are sent to the outpatient office with findings consistent large abscess in the left axilla. She was admitted to the hospital place an IV Unasyn and scheduled for an I&D. This was performed by Dr. Excell Seltzerooper and there was significant purulent material that was drained and a Penrose was placed. She was kept overnight in the hospital for further IV antibiotic therapy. At the time of discharge she was doing well she was afebrile and tolerating diet. Her exam shows a female in no acute distress, awake, alert. Chest wall revealed the left axilla and a Penrose and there was a good cavity with good granulation tissue and no evidence of necrotizing infection or undrained collections. Abdomen: Soft nontender. Extremity: No edema well perfused. Condition of patient at discharge is stable. She will be continued on outpatient antibiotic therapy and we will keep drain for now.    Disposition: 01-Home or Self Care  Discharge Instructions    Call MD for:  difficulty breathing, headache or visual disturbances    Complete by:  As directed    Call MD for:  extreme fatigue    Complete by:  As directed    Call MD for:  hives    Complete by:  As directed    Call MD for:  persistant dizziness or light-headedness    Complete by:  As directed    Call MD for:  persistant nausea and vomiting    Complete by:  As directed    Call MD for:  redness, tenderness, or signs of infection (pain, swelling, redness, odor or green/yellow discharge around incision site)    Complete by:  As directed    Call MD for:  severe uncontrolled pain    Complete by:  As directed    Call MD for:  temperature >100.4    Complete by:  As directed     Diet - low sodium heart healthy    Complete by:  As directed    Discharge instructions    Complete by:  As directed    Shower daily keep drain in   Discharge wound care:    Complete by:  As directed    Daily to BID dry dressing changes,.   Increase activity slowly    Complete by:  As directed        Medication List    STOP taking these medications   cephALEXin 500 MG capsule Commonly known as:  KEFLEX     TAKE these medications   amoxicillin-clavulanate 875-125 MG tablet Commonly known as:  AUGMENTIN Take 1 tablet by mouth 2 (two) times daily.   etonogestrel 68 MG Impl implant Commonly known as:  NEXPLANON 1 each by Subdermal route once.   fluconazole 150 MG tablet Commonly known as:  DIFLUCAN TK 1 T PO PRN FOR YEAST INFECTION   HYDROcodone-acetaminophen 5-325 MG tablet Commonly known as:  NORCO Take 1-2 tablets by mouth every 6 (six) hours as needed for moderate pain. What changed:  how much to take   insulin aspart 100 UNIT/ML injection Commonly known as:  novoLOG Inject 25 Units into the skin 3 (three) times daily with meals.   insulin detemir 100 UNIT/ML injection Commonly known as:  LEVEMIR Inject 50 Units into  the skin 2 (two) times daily.   lisinopril 2.5 MG tablet Commonly known as:  PRINIVIL,ZESTRIL Take 2.5 mg by mouth daily.   PARoxetine 10 MG tablet Commonly known as:  PAXIL Take 1 tablet (10 mg total) by mouth at bedtime.      Follow-up Information    Dionne Miloichard Cooper, MD Follow up on 07/09/2016.   Specialty:  Surgery Why:  @ 10am.  Contact information: [address] [place] [id number] [phone]            Sterling Bigiego Charnelle Bergeman, MD FACS

## 2016-07-07 LAB — AEROBIC/ANAEROBIC CULTURE W GRAM STAIN (SURGICAL/DEEP WOUND)

## 2016-07-09 ENCOUNTER — Encounter: Payer: Self-pay | Admitting: Surgery

## 2016-07-09 ENCOUNTER — Ambulatory Visit (INDEPENDENT_AMBULATORY_CARE_PROVIDER_SITE_OTHER): Payer: Managed Care, Other (non HMO) | Admitting: Surgery

## 2016-07-09 VITALS — BP 146/97 | HR 101 | Temp 98.6°F | Ht 61.0 in | Wt 182.5 lb

## 2016-07-09 DIAGNOSIS — L02419 Cutaneous abscess of limb, unspecified: Secondary | ICD-10-CM

## 2016-07-09 NOTE — Progress Notes (Signed)
Outpatient postop visit  07/09/2016  Dawn Ortiz is an 25 y.o. female.    Procedure: I&D of left axillary abscess  CC: No pain but much improved  HPI: This a patient who underwent an I&D of a left axillary abscess in the hospital last week. She was admitted to the hospital from the office by Dr. Tonita CongWoodham. I had performed her incision and drainage that evening. Patient feels much better now no fevers or chills much less pain. A Penrose is still in place.  Cultures showed Peptostreptococcus. Medications reviewed.    Physical Exam:  BP (!) 146/97   Pulse (!) 101   Temp 98.6 F (37 C) (Oral)   Ht 5\' 1"  (1.549 m)   Wt 182 lb 8 oz (82.8 kg)   BMI 34.48 kg/m     PE: No erythema no purulence Penrose drain is in place.    Assessment/Plan:  Patient doing very well Penrose is removed at this point a dry dressing is placed. She should finish out her Augmentin and should not require any additional antibiotics. I will like to see her back in the office one day next week just to ensure proper healing.  Lattie Hawichard E Zacharey Jensen, MD, FACS

## 2016-07-09 NOTE — Patient Instructions (Signed)
Please continue to place dry dressing over your incisions. Please see your follow up appointment listed below.

## 2016-07-17 ENCOUNTER — Encounter: Payer: Managed Care, Other (non HMO) | Admitting: Surgery

## 2016-07-18 ENCOUNTER — Encounter: Payer: Managed Care, Other (non HMO) | Admitting: Surgery

## 2016-08-27 ENCOUNTER — Ambulatory Visit: Payer: Self-pay | Admitting: Physician Assistant

## 2016-08-27 ENCOUNTER — Encounter: Payer: Self-pay | Admitting: Physician Assistant

## 2016-08-27 VITALS — BP 122/80 | HR 110 | Temp 99.0°F

## 2016-08-27 DIAGNOSIS — L0291 Cutaneous abscess, unspecified: Secondary | ICD-10-CM

## 2016-08-27 MED ORDER — DOXYCYCLINE HYCLATE 100 MG PO TABS: 100.0000 mg | ORAL_TABLET | Freq: Every day | ORAL | 5 refills | Status: DC

## 2016-08-27 MED ORDER — CEPHALEXIN 500 MG PO CAPS: 500.0000 mg | ORAL_CAPSULE | Freq: Three times a day (TID) | ORAL | 0 refills | Status: DC

## 2016-08-27 NOTE — Progress Notes (Signed)
S: had another abscess under her arm, this time they gave her keflex which cleared it up, now has several little ones on vulva, no drainage, no fever/chills, had some leftover keflex from infection where she ended up having surgery and used those for a few days but now doesn't have anymore  O: vitals wnl, nad, skin with 2 small clearing abscesses in groin area, no pus or drainage, n/v intact  A: abscess  P: keflex 500mg  tid, when finished with this medication pt is to start doxy 100mg  qd, try bleach bath once a week, full tub of water with a 1/4 cup bleach

## 2016-10-12 ENCOUNTER — Ambulatory Visit: Payer: Self-pay | Admitting: Physician Assistant

## 2016-10-12 ENCOUNTER — Encounter: Payer: Self-pay | Admitting: Physician Assistant

## 2016-10-12 VITALS — BP 129/89 | HR 101 | Temp 98.3°F | Ht 62.0 in | Wt 185.0 lb

## 2016-10-12 DIAGNOSIS — Z0189 Encounter for other specified special examinations: Secondary | ICD-10-CM

## 2016-10-12 DIAGNOSIS — Z0000 Encounter for general adult medical examination without abnormal findings: Secondary | ICD-10-CM

## 2016-10-12 DIAGNOSIS — Z008 Encounter for other general examination: Secondary | ICD-10-CM

## 2016-10-12 NOTE — Progress Notes (Signed)
S: here for wellness and biometric exam, his of htn, dm, has nexplanon implant, taking doxy to help decrease number of skin infections/abscesses, states this is working well so far, made her nauseated at first but now is ok, remainder ros neg, sees eye doctor and dentist; nonsmoker, no drugs, occasional etoh, fam hx of dm, htn, cad, pgf died of colon ca  O: vitals wnl, nad, perrl eomi, throat wnl, neck supple no lymph, lungs c t a, cv rrr, abd soft nontender bs normal all 4 quads, n/v intact, MS wnl  A: well adult, biometrics, dm, htn  P: exec panel today, vit d, a1c; f/u prn,

## 2016-10-13 LAB — CMP12+LP+TP+TSH+6AC+CBC/D/PLT
A/G RATIO: 1.2 (ref 1.2–2.2)
ALBUMIN: 4.4 g/dL (ref 3.5–5.5)
ALT: 29 IU/L (ref 0–32)
AST: 19 IU/L (ref 0–40)
Alkaline Phosphatase: 134 IU/L — ABNORMAL HIGH (ref 39–117)
BASOS: 0 %
BUN/Creatinine Ratio: 17 (ref 9–23)
BUN: 11 mg/dL (ref 6–20)
Basophils Absolute: 0 10*3/uL (ref 0.0–0.2)
Bilirubin Total: 0.3 mg/dL (ref 0.0–1.2)
Calcium: 10 mg/dL (ref 8.7–10.2)
Chloride: 100 mmol/L (ref 96–106)
Chol/HDL Ratio: 4.5 ratio units — ABNORMAL HIGH (ref 0.0–4.4)
Cholesterol, Total: 150 mg/dL (ref 100–199)
Creatinine, Ser: 0.64 mg/dL (ref 0.57–1.00)
EOS (ABSOLUTE): 0.1 10*3/uL (ref 0.0–0.4)
Eos: 1 %
Estimated CHD Risk: 1.1 times avg. — ABNORMAL HIGH (ref 0.0–1.0)
Free Thyroxine Index: 2 (ref 1.2–4.9)
GFR calc Af Amer: 143 mL/min/{1.73_m2} (ref >59)
GFR calc non Af Amer: 124 mL/min/{1.73_m2} (ref >59)
GGT: 150 IU/L — AB (ref 0–60)
Globulin, Total: 3.6 g/dL (ref 1.5–4.5)
Glucose: 283 mg/dL — ABNORMAL HIGH (ref 65–99)
HDL: 33 mg/dL — AB (ref >39)
HEMATOCRIT: 40 % (ref 34.0–46.6)
Hemoglobin: 12.7 g/dL (ref 11.1–15.9)
IRON: 51 ug/dL (ref 27–159)
Immature Grans (Abs): 0 10*3/uL (ref 0.0–0.1)
Immature Granulocytes: 0 %
LDH: 130 IU/L (ref 119–226)
LDL Calculated: 98 mg/dL (ref 0–99)
LYMPHS ABS: 2.7 10*3/uL (ref 0.7–3.1)
Lymphs: 43 %
MCH: 25.3 pg — AB (ref 26.6–33.0)
MCHC: 31.8 g/dL (ref 31.5–35.7)
MCV: 80 fL (ref 79–97)
MONOS ABS: 0.5 10*3/uL (ref 0.1–0.9)
Monocytes: 7 %
NEUTROS ABS: 3.1 10*3/uL (ref 1.4–7.0)
Neutrophils: 49 %
PHOSPHORUS: 3.4 mg/dL (ref 2.5–4.5)
Platelets: 223 10*3/uL (ref 150–379)
Potassium: 4.3 mmol/L (ref 3.5–5.2)
RBC: 5.01 x10E6/uL (ref 3.77–5.28)
RDW: 15.7 % — ABNORMAL HIGH (ref 12.3–15.4)
SODIUM: 139 mmol/L (ref 134–144)
T3 UPTAKE RATIO: 27 % (ref 24–39)
T4 TOTAL: 7.5 ug/dL (ref 4.5–12.0)
TSH: 0.425 u[IU]/mL — ABNORMAL LOW (ref 0.450–4.500)
Total Protein: 8 g/dL (ref 6.0–8.5)
Triglycerides: 97 mg/dL (ref 0–149)
Uric Acid: 6.1 mg/dL (ref 2.5–7.1)
VLDL Cholesterol Cal: 19 mg/dL (ref 5–40)
WBC: 6.4 10*3/uL (ref 3.4–10.8)

## 2016-10-13 LAB — HEMOGLOBIN A1C
ESTIMATED AVERAGE GLUCOSE: 243 mg/dL
HEMOGLOBIN A1C: 10.1 % — AB (ref 4.8–5.6)

## 2016-10-13 LAB — VITAMIN D 25 HYDROXY (VIT D DEFICIENCY, FRACTURES): Vit D, 25-Hydroxy: 8.4 ng/mL — ABNORMAL LOW (ref 30.0–100.0)

## 2016-10-31 ENCOUNTER — Ambulatory Visit: Payer: Self-pay | Admitting: Physician Assistant

## 2016-11-23 ENCOUNTER — Encounter: Payer: Self-pay | Admitting: Physician Assistant

## 2016-11-23 ENCOUNTER — Ambulatory Visit: Payer: Self-pay | Admitting: Physician Assistant

## 2016-11-23 VITALS — BP 139/90 | HR 112 | Temp 98.9°F

## 2016-11-23 DIAGNOSIS — S60351A Superficial foreign body of right thumb, initial encounter: Secondary | ICD-10-CM

## 2016-11-23 DIAGNOSIS — L0291 Cutaneous abscess, unspecified: Secondary | ICD-10-CM

## 2016-11-23 DIAGNOSIS — H8110 Benign paroxysmal vertigo, unspecified ear: Secondary | ICD-10-CM

## 2016-11-23 MED ORDER — MECLIZINE HCL 32 MG PO TABS: 32.0000 mg | ORAL_TABLET | Freq: Three times a day (TID) | ORAL | 0 refills | Status: DC | PRN

## 2016-11-23 MED ORDER — FLUCONAZOLE 150 MG PO TABS: ORAL_TABLET | ORAL | 0 refills | Status: DC

## 2016-11-23 MED ORDER — CLINDAMYCIN HCL 150 MG PO CAPS: 150.0000 mg | ORAL_CAPSULE | Freq: Three times a day (TID) | ORAL | 0 refills | Status: DC

## 2016-11-23 MED ORDER — VITAMIN D (ERGOCALCIFEROL) 1.25 MG (50000 UNIT) PO CAPS: 50000.0000 [IU] | ORAL_CAPSULE | ORAL | 3 refills | Status: DC

## 2016-11-23 NOTE — Progress Notes (Signed)
S: pt states she has an abscess that is starting on her left inner thigh, isn't fluctuant but not hard yet either, has been taking the doxy but forgets to some days, also having some dizziness when she turns her head, happened in the past, took antivert and it helped, also has a piece of glass in her finger, been there about a month, can feel it  O: vitals wnl, nad, perrl, tms dull, lungs c t a, cv rrr, small abscess left inner thigh, not fluctuant yet, small hard spot on r thumb, scraped callous away small sliver removed, no fb left to naked eye, n/v intact  A: abscess, vertigo, fb removal r thumb  P: antivert, clindamycin, return if worsening for I and D of abscess, if worse over the weekend go to acute care, stop doxy while on clindamycin, also rx for vit d as pt's vit d was really low on lab review

## 2017-04-17 ENCOUNTER — Ambulatory Visit: Payer: Self-pay | Admitting: Physician Assistant

## 2017-04-17 ENCOUNTER — Encounter: Payer: Self-pay | Admitting: Physician Assistant

## 2017-04-17 VITALS — BP 120/70 | HR 107 | Temp 98.4°F | Resp 16

## 2017-04-17 DIAGNOSIS — L0291 Cutaneous abscess, unspecified: Secondary | ICD-10-CM

## 2017-04-17 MED ORDER — LEVOFLOXACIN 500 MG PO TABS: 500.0000 mg | ORAL_TABLET | Freq: Every day | ORAL | 0 refills | Status: DC

## 2017-04-17 MED ORDER — FLUCONAZOLE 150 MG PO TABS: ORAL_TABLET | ORAL | 0 refills | Status: DC

## 2017-04-17 NOTE — Telephone Encounter (Signed)
See attached message

## 2017-04-17 NOTE — Progress Notes (Signed)
S: c/o abscess on left thigh, states gets these often, last time the clindamycin didn't help and had to get it lanced, the doxy didn't help prevent it and is allergic to sulfa, has been using tea tree oil to help relieve sx, denies fever/chills  O: vitals wnl, nad, skin with red swollen tender area, no drainage, area is hard not fluctuant, lungs c t a, cv rrr, n/v intact  A: abscess  P: levaquin  qd x 10d, diflucan if needed, return to the clinic for I and D if worsening

## 2017-04-18 ENCOUNTER — Inpatient Hospital Stay: Admit: 2017-04-18 | Payer: Self-pay | Admitting: Surgery

## 2017-04-18 ENCOUNTER — Ambulatory Visit
Admission: RE | Admit: 2017-04-18 | Discharge: 2017-04-18 | Disposition: A | Discharge status: Home / Self Care | Payer: Managed Care, Other (non HMO) | Source: Ambulatory Visit | Attending: Surgery | Admitting: Surgery

## 2017-04-18 ENCOUNTER — Encounter: Payer: Self-pay | Admitting: *Deleted

## 2017-04-18 ENCOUNTER — Encounter
Admission: RE | Disposition: A | Discharge status: Home / Self Care | Payer: Self-pay | Source: Ambulatory Visit | Attending: Surgery

## 2017-04-18 ENCOUNTER — Encounter: Payer: Self-pay | Admitting: Physician Assistant

## 2017-04-18 ENCOUNTER — Ambulatory Visit (INDEPENDENT_AMBULATORY_CARE_PROVIDER_SITE_OTHER): Payer: Managed Care, Other (non HMO) | Admitting: Surgery

## 2017-04-18 ENCOUNTER — Telehealth: Payer: Self-pay | Admitting: Surgery

## 2017-04-18 ENCOUNTER — Encounter: Payer: Self-pay | Admitting: Surgery

## 2017-04-18 ENCOUNTER — Inpatient Hospital Stay: Payer: Managed Care, Other (non HMO) | Admitting: Anesthesiology

## 2017-04-18 DIAGNOSIS — F419 Anxiety disorder, unspecified: Secondary | ICD-10-CM | POA: Insufficient documentation

## 2017-04-18 DIAGNOSIS — Z87891 Personal history of nicotine dependence: Secondary | ICD-10-CM | POA: Insufficient documentation

## 2017-04-18 DIAGNOSIS — L02214 Cutaneous abscess of groin: Secondary | ICD-10-CM | POA: Diagnosis not present

## 2017-04-18 DIAGNOSIS — E119 Type 2 diabetes mellitus without complications: Secondary | ICD-10-CM | POA: Diagnosis not present

## 2017-04-18 DIAGNOSIS — L02416 Cutaneous abscess of left lower limb: Secondary | ICD-10-CM | POA: Diagnosis not present

## 2017-04-18 DIAGNOSIS — Z79899 Other long term (current) drug therapy: Secondary | ICD-10-CM | POA: Insufficient documentation

## 2017-04-18 DIAGNOSIS — K219 Gastro-esophageal reflux disease without esophagitis: Secondary | ICD-10-CM | POA: Diagnosis not present

## 2017-04-18 HISTORY — PX: INCISION AND DRAINAGE ABSCESS: SHX5864

## 2017-04-18 LAB — BASIC METABOLIC PANEL
Anion gap: 10 (ref 5–15)
BUN: 10 mg/dL (ref 6–20)
CALCIUM: 9.6 mg/dL (ref 8.9–10.3)
CO2: 21 mmol/L — ABNORMAL LOW (ref 22–32)
CREATININE: 0.73 mg/dL (ref 0.44–1.00)
Chloride: 105 mmol/L (ref 101–111)
GFR calc Af Amer: >60 mL/min (ref >60)
Glucose, Bld: 205 mg/dL — ABNORMAL HIGH (ref 65–99)
POTASSIUM: 4.1 mmol/L (ref 3.5–5.1)
SODIUM: 136 mmol/L (ref 135–145)

## 2017-04-18 LAB — CBC WITH DIFFERENTIAL/PLATELET
BASOS ABS: 0 10*3/uL (ref 0–0.1)
BASOS PCT: 0 %
EOS ABS: 0 10*3/uL (ref 0–0.7)
Eosinophils Relative: 0 %
HCT: 35.2 % (ref 35.0–47.0)
Hemoglobin: 12.3 g/dL (ref 12.0–16.0)
Lymphocytes Relative: 18 %
Lymphs Abs: 2.4 10*3/uL (ref 1.0–3.6)
MCH: 27.1 pg (ref 26.0–34.0)
MCHC: 34.9 g/dL (ref 32.0–36.0)
MCV: 77.6 fL — ABNORMAL LOW (ref 80.0–100.0)
Monocytes Absolute: 0.8 10*3/uL (ref 0.2–0.9)
Monocytes Relative: 6 %
Neutro Abs: 10.2 10*3/uL — ABNORMAL HIGH (ref 1.4–6.5)
Neutrophils Relative %: 76 %
PLATELETS: 331 10*3/uL (ref 150–440)
RBC: 4.53 MIL/uL (ref 3.80–5.20)
RDW: 14.9 % — AB (ref 11.5–14.5)
WBC: 13.6 10*3/uL — AB (ref 3.6–11.0)

## 2017-04-18 LAB — GLUCOSE, CAPILLARY
GLUCOSE-CAPILLARY: 199 mg/dL — AB (ref 65–99)
Glucose-Capillary: 157 mg/dL — ABNORMAL HIGH (ref 65–99)

## 2017-04-18 LAB — POCT PREGNANCY, URINE: PREG TEST UR: NEGATIVE

## 2017-04-18 SURGERY — INCISION AND DRAINAGE, ABSCESS
Anesthesia: General | Laterality: Left

## 2017-04-18 MED ORDER — SODIUM CHLORIDE 0.9 % IV SOLN
INTRAVENOUS | Status: DC
  Administered 2017-04-18: 16:00:00 via INTRAVENOUS

## 2017-04-18 MED ORDER — BUPIVACAINE HCL (PF) 0.5 % IJ SOLN
INTRAMUSCULAR | Status: DC | PRN
  Administered 2017-04-18: 10 mL

## 2017-04-18 MED ORDER — OXYCODONE HCL 5 MG PO TABS: 5.0000 mg | ORAL_TABLET | ORAL | 0 refills | Status: DC | PRN

## 2017-04-18 MED ORDER — ACETAMINOPHEN 10 MG/ML IV SOLN
INTRAVENOUS | Status: AC
  Filled 2017-04-18: qty 100

## 2017-04-18 MED ORDER — LIDOCAINE HCL (CARDIAC) 20 MG/ML IV SOLN
INTRAVENOUS | Status: DC | PRN
  Administered 2017-04-18: 80 mg via INTRAVENOUS

## 2017-04-18 MED ORDER — CEFAZOLIN SODIUM-DEXTROSE 2-4 GM/100ML-% IV SOLN
INTRAVENOUS | Status: AC
  Filled 2017-04-18: qty 100

## 2017-04-18 MED ORDER — PROPOFOL 10 MG/ML IV BOLUS
INTRAVENOUS | Status: DC | PRN
  Administered 2017-04-18: 200 mg via INTRAVENOUS

## 2017-04-18 MED ORDER — OXYCODONE HCL 5 MG/5ML PO SOLN
5.0000 mg | Freq: Once | ORAL | Status: AC | PRN
  Administered 2017-04-18: 5 mg via ORAL

## 2017-04-18 MED ORDER — FAMOTIDINE 20 MG PO TABS
20.0000 mg | ORAL_TABLET | Freq: Once | ORAL | Status: AC
  Administered 2017-04-18: 20 mg via ORAL

## 2017-04-18 MED ORDER — MIDAZOLAM HCL 2 MG/2ML IJ SOLN
INTRAMUSCULAR | Status: AC
  Filled 2017-04-18: qty 2

## 2017-04-18 MED ORDER — HEPARIN SODIUM (PORCINE) 5000 UNIT/ML IJ SOLN
INTRAMUSCULAR | Status: AC
  Administered 2017-04-18: 5000 [IU] via SUBCUTANEOUS
  Filled 2017-04-18: qty 1

## 2017-04-18 MED ORDER — FENTANYL CITRATE (PF) 100 MCG/2ML IJ SOLN: 25.0000 ug | INTRAMUSCULAR | Status: DC | PRN

## 2017-04-18 MED ORDER — MIDAZOLAM HCL 2 MG/2ML IJ SOLN
INTRAMUSCULAR | Status: DC | PRN
  Administered 2017-04-18: 2 mg via INTRAVENOUS

## 2017-04-18 MED ORDER — KETOROLAC TROMETHAMINE 30 MG/ML IJ SOLN
INTRAMUSCULAR | Status: DC | PRN
  Administered 2017-04-18: 30 mg via INTRAVENOUS

## 2017-04-18 MED ORDER — FENTANYL CITRATE (PF) 100 MCG/2ML IJ SOLN
INTRAMUSCULAR | Status: AC
  Filled 2017-04-18: qty 2

## 2017-04-18 MED ORDER — OXYCODONE HCL 5 MG PO TABS: 5.0000 mg | ORAL_TABLET | Freq: Once | ORAL | Status: AC | PRN

## 2017-04-18 MED ORDER — PHENYLEPHRINE HCL 10 MG/ML IJ SOLN
INTRAMUSCULAR | Status: DC | PRN
  Administered 2017-04-18: 50 ug via INTRAVENOUS

## 2017-04-18 MED ORDER — CEFAZOLIN SODIUM-DEXTROSE 2-4 GM/100ML-% IV SOLN
2.0000 g | INTRAVENOUS | Status: AC
  Administered 2017-04-18: 2 g via INTRAVENOUS

## 2017-04-18 MED ORDER — PROPOFOL 10 MG/ML IV BOLUS
INTRAVENOUS | Status: AC
  Filled 2017-04-18: qty 20

## 2017-04-18 MED ORDER — ACETAMINOPHEN 10 MG/ML IV SOLN
INTRAVENOUS | Status: DC | PRN
  Administered 2017-04-18: 1000 mg via INTRAVENOUS

## 2017-04-18 MED ORDER — HEPARIN SODIUM (PORCINE) 5000 UNIT/ML IJ SOLN
5000.0000 [IU] | Freq: Once | INTRAMUSCULAR | Status: AC
  Administered 2017-04-18: 5000 [IU] via SUBCUTANEOUS

## 2017-04-18 MED ORDER — CHLORHEXIDINE GLUCONATE CLOTH 2 % EX PADS
6.0000 | MEDICATED_PAD | Freq: Once | CUTANEOUS | Status: AC
  Administered 2017-04-18: 6 via TOPICAL

## 2017-04-18 MED ORDER — BUPIVACAINE HCL (PF) 0.5 % IJ SOLN
INTRAMUSCULAR | Status: AC
  Filled 2017-04-18: qty 30

## 2017-04-18 MED ORDER — LACTATED RINGERS IV SOLN
INTRAVENOUS | Status: DC | PRN
  Administered 2017-04-18: 18:00:00 via INTRAVENOUS

## 2017-04-18 MED ORDER — FENTANYL CITRATE (PF) 100 MCG/2ML IJ SOLN
INTRAMUSCULAR | Status: DC | PRN
  Administered 2017-04-18: 25 ug via INTRAVENOUS
  Administered 2017-04-18 (×2): 50 ug via INTRAVENOUS
  Administered 2017-04-18: 25 ug via INTRAVENOUS
  Administered 2017-04-18: 50 ug via INTRAVENOUS

## 2017-04-18 MED ORDER — CHLORHEXIDINE GLUCONATE CLOTH 2 % EX PADS: 6.0000 | MEDICATED_PAD | Freq: Once | CUTANEOUS | Status: DC

## 2017-04-18 MED ORDER — ONDANSETRON HCL 4 MG/2ML IJ SOLN
INTRAMUSCULAR | Status: DC | PRN
  Administered 2017-04-18: 4 mg via INTRAVENOUS

## 2017-04-18 MED ORDER — OXYCODONE HCL 5 MG/5ML PO SOLN
ORAL | Status: AC
  Administered 2017-04-18: 5 mg via ORAL
  Filled 2017-04-18: qty 5

## 2017-04-18 MED ORDER — FAMOTIDINE 20 MG PO TABS
ORAL_TABLET | ORAL | Status: AC
  Administered 2017-04-18: 20 mg via ORAL
  Filled 2017-04-18: qty 1

## 2017-04-18 SURGICAL SUPPLY — 23 items
BLADE SURG 15 STRL LF DISP TIS (BLADE) ×1 IMPLANT
BLADE SURG 15 STRL SS (BLADE) ×1
CHLORAPREP W/TINT 10.5 ML (MISCELLANEOUS) ×2 IMPLANT
DRAIN PENROSE 5/8X18 LTX STRL (WOUND CARE) ×2 IMPLANT
DRAPE LAPAROTOMY 100X77 ABD (DRAPES) ×2 IMPLANT
DRAPE UTILITY 15X26 TOWEL STRL (DRAPES) ×2 IMPLANT
ELECT CAUTERY BLADE 6.4 (BLADE) ×2 IMPLANT
ELECT REM PT RETURN 9FT ADLT (ELECTROSURGICAL) ×2
ELECTRODE REM PT RTRN 9FT ADLT (ELECTROSURGICAL) ×1 IMPLANT
GAUZE SPONGE 4X4 12PLY STRL (GAUZE/BANDAGES/DRESSINGS) ×2 IMPLANT
GLOVE BIO SURGEON STRL SZ8 (GLOVE) ×2 IMPLANT
GOWN STRL REUS W/ TWL LRG LVL3 (GOWN DISPOSABLE) ×1 IMPLANT
GOWN STRL REUS W/TWL LRG LVL3 (GOWN DISPOSABLE) ×1
KIT RM TURNOVER STRD PROC AR (KITS) ×2 IMPLANT
NEEDLE HYPO 25X1 1.5 SAFETY (NEEDLE) ×2 IMPLANT
NS IRRIG 500ML POUR BTL (IV SOLUTION) ×2 IMPLANT
PACK BASIN MINOR ARMC (MISCELLANEOUS) ×2 IMPLANT
PAD ABD DERMACEA PRESS 5X9 (GAUZE/BANDAGES/DRESSINGS) ×2 IMPLANT
SPONGE LAP 18X18 5 PK (GAUZE/BANDAGES/DRESSINGS) ×2 IMPLANT
SUT ETHILON 3 0 PS 1 (SUTURE) ×2 IMPLANT
SWAB DUAL CULTURE TRANS RED ST (MISCELLANEOUS) ×2 IMPLANT
SYR BULB EAR ULCER 3OZ GRN STR (SYRINGE) ×2 IMPLANT
SYRINGE 10CC LL (SYRINGE) ×2 IMPLANT

## 2017-04-18 NOTE — Discharge Instructions (Signed)

## 2017-04-18 NOTE — Telephone Encounter (Signed)
See message attached

## 2017-04-18 NOTE — Anesthesia Preprocedure Evaluation (Signed)
Anesthesia Evaluation  Patient identified by MRN, date of birth, ID band Patient awake    Reviewed: Allergy & Precautions, H&P , NPO status , Patient's Chart, lab work & pertinent test results  History of Anesthesia Complications Negative for: history of anesthetic complications  Airway Mallampati: III  TM Distance: >3 FB Neck ROM: full    Dental  (+) Poor Dentition, Chipped   Pulmonary neg shortness of breath, former smoker,           Cardiovascular Exercise Tolerance: Good (-) angina(-) Past MI and (-) DOE negative cardio ROS       Neuro/Psych PSYCHIATRIC DISORDERS Anxiety negative neurological ROS     GI/Hepatic Neg liver ROS, GERD  Medicated and Controlled,  Endo/Other  diabetes, Type 2  Renal/GU      Musculoskeletal   Abdominal   Peds  Hematology negative hematology ROS (+)   Anesthesia Other Findings Past Medical History: No date: Anxiety No date: Diabetes mellitus without complication (HCC) No date: GERD (gastroesophageal reflux disease)  Past Surgical History: No date: INCISION AND DRAINAGE 07/02/2016: INCISION AND DRAINAGE ABSCESS; Left     Comment:  Procedure: INCISION AND DRAINAGE ABSCESS;  Surgeon:               Lattie Haw, MD;  Location: [facility];  Service:               General;  Laterality: Left; 12/03/2014: PILONIDAL CYST EXCISION     Comment:  Procedure: CYST EXCISION PILONIDAL EXTENSIVE;  Surgeon:               Duwaine Maxin, MD;  Location: [facility];  Service:               General;;  BMI    Body Mass Index:  33.84 kg/m      Reproductive/Obstetrics negative OB ROS                             Anesthesia Physical Anesthesia Plan  ASA: III  Anesthesia Plan: General LMA   Post-op Pain Management:    Induction: Intravenous  PONV Risk Score and Plan: 3 and Ondansetron, Dexamethasone, Midazolam and Treatment may vary due to age or medical  condition  Airway Management Planned: LMA  Additional Equipment:   Intra-op Plan:   Post-operative Plan: Extubation in OR  Informed Consent: I have reviewed the patients History and Physical, chart, labs and discussed the procedure including the risks, benefits and alternatives for the proposed anesthesia with the patient or authorized representative who has indicated his/her understanding and acceptance.   Dental Advisory Given  Plan Discussed with: Anesthesiologist, CRNA and Surgeon  Anesthesia Plan Comments: (Patient consented for risks of anesthesia including but not limited to:  - adverse reactions to medications - damage to teeth, lips or other oral mucosa - sore throat or hoarseness - Damage to heart, brain, lungs or loss of life  Patient voiced understanding.)        Anesthesia Quick Evaluation

## 2017-04-18 NOTE — Anesthesia Postprocedure Evaluation (Signed)
Anesthesia Post Note  Patient: Dawn Ortiz  Procedure(s) Performed: Procedure(s) (LRB): INCISION AND DRAINAGE ABSCESS-LEFT GROIN (Left)  Patient location during evaluation: PACU Anesthesia Type: General Level of consciousness: awake and alert Pain management: pain level controlled Vital Signs Assessment: post-procedure vital signs reviewed and stable Respiratory status: spontaneous breathing, nonlabored ventilation, respiratory function stable and patient connected to nasal cannula oxygen Cardiovascular status: blood pressure returned to baseline and stable Postop Assessment: no apparent nausea or vomiting Anesthetic complications: no     Last Vitals:  Vitals:   04/18/17 1910 04/18/17 1915  BP: 111/77   Pulse: (!) 109 (!) 109  Resp: 14 14  Temp:    SpO2: 98% 98%    Last Pain:  Vitals:   04/18/17 1855  TempSrc:   PainSc: 0-No pain                 Cleda Mccreedy Tarrin Lebow

## 2017-04-18 NOTE — Anesthesia Procedure Notes (Signed)
Procedure Name: LMA Insertion Performed by: Darrius Montano Pre-anesthesia Checklist: Patient identified, Patient being monitored, Timeout performed, Emergency Drugs available and Suction available Patient Re-evaluated:Patient Re-evaluated prior to induction Oxygen Delivery Method: Circle system utilized Preoxygenation: Pre-oxygenation with 100% oxygen Induction Type: IV induction Ventilation: Mask ventilation without difficulty LMA: LMA inserted LMA Size: 3.5 Tube type: Oral Number of attempts: 1 Placement Confirmation: positive ETCO2 and breath sounds checked- equal and bilateral Tube secured with: Tape Dental Injury: Teeth and Oropharynx as per pre-operative assessment        

## 2017-04-18 NOTE — H&P (View-Only) (Signed)
H&P  04/18/2017  Dawn Ortiz is an 26 y.o. female.   CC: Thigh abscess  HPI: This a patient known to be from previous axillary abscess who presents today with a thigh abscess. She was started on oral antibiotics by her primary care physician yesterday. Left inner thigh area. No fevers or chills. Worsening pain over the last few days. She's had this same area I indeed multiple times in the past.  Past Medical History:  Diagnosis Date  . Anxiety   . Diabetes mellitus without complication (HCC)   . GERD (gastroesophageal reflux disease)     Past Surgical History:  Procedure Laterality Date  . INCISION AND DRAINAGE    . INCISION AND DRAINAGE ABSCESS Left 07/02/2016   Procedure: INCISION AND DRAINAGE ABSCESS;  Surgeon: Lattie Haw, MD;  Location: [facility];  Service: General;  Laterality: Left;  . PILONIDAL CYST EXCISION  12/03/2014   Procedure: CYST EXCISION PILONIDAL EXTENSIVE;  Surgeon: Duwaine Maxin, MD;  Location: [facility];  Service: General;;    Family History  Problem Relation Age of Onset  . Healthy Mother   . Hypertension Father   . Diabetes Father   . Heart disease Father   . Healthy Brother     Social History:  reports that she has quit smoking. Her smoking use included Cigarettes. She has never used smokeless tobacco. She reports that she drinks about 0.6 - 1.2 oz of alcohol per week . She reports that she does not use drugs.  Allergies:  Allergies  Allergen Reactions  . Sulfa Antibiotics Hives    Medications reviewed.   Review of Systems:   Review of Systems  Constitutional: Negative.   HENT: Negative.   Eyes: Negative.   Respiratory: Negative.   Cardiovascular: Negative.   Gastrointestinal: Negative.   Genitourinary: Negative.   Musculoskeletal: Negative.   Skin: Negative.   Neurological: Negative.   Endo/Heme/Allergies: Negative.   Psychiatric/Behavioral: Negative.      Physical Exam:  There were no vitals taken for this  visit.  Physical Exam  Constitutional: She is oriented to person, place, and time and well-developed, well-nourished, and in no distress. No distress.  HENT:  Head: Normocephalic and atraumatic.  Eyes: Pupils are equal, round, and reactive to light. Right eye exhibits no discharge. Left eye exhibits no discharge. No scleral icterus.  Neck: Normal range of motion. No JVD present.  Cardiovascular: Normal rate, regular rhythm and normal heart sounds.   Pulmonary/Chest: Effort normal and breath sounds normal. No respiratory distress. She has no wheezes. She has no rales.  Abdominal: Soft. She exhibits no distension. There is no tenderness. There is no rebound and no guarding.  Musculoskeletal: Normal range of motion. She exhibits no edema or tenderness.  Lymphadenopathy:    She has no cervical adenopathy.  Neurological: She is alert and oriented to person, place, and time.  Skin: Skin is warm. She is not diaphoretic. There is erythema.  Mass, fluctuance, tenderness left groin fold  Vitals reviewed.     No results found for this or any previous visit (from the past 48 hour(s)). No results found.  Assessment/Plan:  Abscess in the left groin. Patient is diabetic. I have recommended admission to the hospital either as a floor patient or through same day surgery for incision and drainage under general anesthesia. She has been nothing by mouth since this morning. I discussed this case with Dr. Earlene Plater who is in the day shift. He is arranging for 0R time and we  will arrange for admission.  I discussed with her the rationale for surgery the options of observation risk bleeding infection recurrence and open wound she understood and agreed to proceed  Lattie Haw, MD, FACS

## 2017-04-18 NOTE — Telephone Encounter (Signed)
See my response.

## 2017-04-18 NOTE — Transfer of Care (Signed)
Immediate Anesthesia Transfer of Care Note  Patient: Dawn Ortiz  Procedure(s) Performed: Procedure(s): INCISION AND DRAINAGE ABSCESS-LEFT GROIN (Left)  Patient Location: PACU  Anesthesia Type:General  Level of Consciousness: awake and alert   Airway & Oxygen Therapy: Patient Spontanous Breathing and Patient connected to nasal cannula oxygen  Post-op Assessment: Report given to RN and Post -op Vital signs reviewed and stable  Post vital signs: Reviewed and stable  Last Vitals:  Vitals:   04/18/17 1532  BP: (!) 141/87  Pulse: (!) 129  Resp: 18  Temp: 37.2 C  SpO2: 100%    Last Pain:  Vitals:   04/18/17 1532  TempSrc: Oral  PainSc: 8          Complications: No apparent anesthesia complications

## 2017-04-18 NOTE — Progress Notes (Signed)
H&P  04/18/2017  Dawn Ortiz is an 25 y.o. female.   CC: Thigh abscess  HPI: This a patient known to be from previous axillary abscess who presents today with a thigh abscess. She was started on oral antibiotics by her primary care physician yesterday. Left inner thigh area. No fevers or chills. Worsening pain over the last few days. She's had this same area I indeed multiple times in the past.  Past Medical History:  Diagnosis Date  . Anxiety   . Diabetes mellitus without complication (HCC)   . GERD (gastroesophageal reflux disease)     Past Surgical History:  Procedure Laterality Date  . INCISION AND DRAINAGE    . INCISION AND DRAINAGE ABSCESS Left 07/02/2016   Procedure: INCISION AND DRAINAGE ABSCESS;  Surgeon: Ketzaly Cardella E Neita Landrigan, MD;  Location: [facility];  Service: General;  Laterality: Left;  . PILONIDAL CYST EXCISION  12/03/2014   Procedure: CYST EXCISION PILONIDAL EXTENSIVE;  Surgeon: [name], MD;  Location: [facility] OR[facility];  Service: General;;    Family History  Problem Relation Age of Onset  . Healthy Mother   . Hypertension Father   . Diabetes Father   . Heart disease Father   . Healthy Brother     Social History:  reports that she has quit smoking. Her smoking use included Cigarettes. She has never used smokeless tobacco. She reports that she drinks about 0.6 - 1.2 oz of alcohol per week . She reports that she does not use drugs.  Allergies:  Allergies  Allergen Reactions  . Sulfa Antibiotics Hives    Medications reviewed.   Review of Systems:   Review of Systems  Constitutional: Negative.   HENT: Negative.   Eyes: Negative.   Respiratory: Negative.   Cardiovascular: Negative.   Gastrointestinal: Negative.   Genitourinary: Negative.   Musculoskeletal: Negative.   Skin: Negative.   Neurological: Negative.   Endo/Heme/Allergies: Negative.   Psychiatric/Behavioral: Negative.      Physical Exam:  There were no vitals taken for this  visit.  Physical Exam  Constitutional: She is oriented to person, place, and time and well-developed, well-nourished, and in no distress. No distress.  HENT:  Head: Normocephalic and atraumatic.  Eyes: Pupils are equal, round, and reactive to light. Right eye exhibits no discharge. Left eye exhibits no discharge. No scleral icterus.  Neck: Normal range of motion. No JVD present.  Cardiovascular: Normal rate, regular rhythm and normal heart sounds.   Pulmonary/Chest: Effort normal and breath sounds normal. No respiratory distress. She has no wheezes. She has no rales.  Abdominal: Soft. She exhibits no distension. There is no tenderness. There is no rebound and no guarding.  Musculoskeletal: Normal range of motion. She exhibits no edema or tenderness.  Lymphadenopathy:    She has no cervical adenopathy.  Neurological: She is alert and oriented to person, place, and time.  Skin: Skin is warm. She is not diaphoretic. There is erythema.  Mass, fluctuance, tenderness left groin fold  Vitals reviewed.     No results found for this or any previous visit (from the past 48 hour(s)). No results found.  Assessment/Plan:  Abscess in the left groin. Patient is diabetic. I have recommended admission to the hospital either as a floor patient or through same day surgery for incision and drainage under general anesthesia. She has been nothing by mouth since this morning. I discussed this case with Dr. [name] who is in the day shift. He is arranging for 0R time and we   will arrange for admission.  I discussed with her the rationale for surgery the options of observation risk bleeding infection recurrence and open wound she understood and agreed to proceed  Lattie Haw, MD, FACS

## 2017-04-18 NOTE — Anesthesia Post-op Follow-up Note (Signed)
Anesthesia QCDR form completed.        

## 2017-04-18 NOTE — Op Note (Signed)
SURGICAL OPERATIVE REPORT  DATE OF PROCEDURE: 04/18/2017  ATTENDING Surgeon(s): Ancil Linsey, MD  ANESTHESIA: general   PRE-OPERATIVE DIAGNOSIS: Left medial thigh abscess (icd-10's: L02.416)  POST-OPERATIVE DIAGNOSIS: Left medial thigh abscess (icd-10's: L02.416)  PROCEDURE(S):  1.) Incision and drainage of loculated Left medial thigh abscess (cpt: 10061)  INTRAOPERATIVE FINDINGS: ~40 mL of foul-smelling brownish purulent fluid under significant pressure from Left medial thigh abscess  INTRAVENOUS FLUIDS: 600 mL crystalloid   ESTIMATED BLOOD LOSS: Minimal (<20 mL)   URINE OUTPUT: No Foley catheter   SPECIMENS: Source of Specimen:  Left medial thigh abscess for culture  IMPLANTS: None  DRAINS: Penrose drain in the Left medial thigh abscess cavity  COMPLICATIONS: None apparent  CONDITION AT END OF PROCEDURE: Hemodynamically stable and extubated  DISPOSITION OF PATIENT: PACU  INDICATIONS FOR PROCEDURE:  26 year old Female presented to outpatient surgical clinic today for evaluation of Left medial thigh abscess, for which she was started on oral antibiotics yesterday by her primary care physician. Patient is diabetic and known to our practice s/p prior incision and drainage of axillary abscesses. All risks, benefits, and alternatives to incision and drainage of Left medial thigh abscess were discussed with the patient, all of patient's questions were answered to her expressed satisfaction, and informed consent was obtained and documented.  DETAILS OF PROCEDURE: Patient was brought to the operating suite and appropriately identified. General anesthesia was administered along with appropriate pre-operative antibiotics, and endotracheal intubation was performed by anesthetist. In supine position, operative site was prepped and draped in the usual sterile fashion, and following a brief time out, the focus of maximal fluctuance was identified and a 2 cm incision was made using a  #11 blade scalpel, upon which >40 mL of foul-smelling brownish-purulent fluid was immediately released under significant pressure and deep abscess culture was obtained. Additional fluid was then expressed, and intra-abscess septations/loculations were then disrupted using a hemostat. The abscess cavity was then irrigated and suctioned. A dry gauze laparotomy pad was inserted and removed, after which a 5/8" penrose drain was advanced into the former abscess cavity to promote drainage and prevent closure of the skin over the drained abscess cavity, and the drain was secured using a 3-0 nylon suture x 2. Surrounding skin was then cleaned and dried, and sterile dry gauze and adhesive dressing was applied. Patient was then safely able to be extubated, awakened, and transferred to PACU for post-operative monitoring and care.  I was present for all aspects of the above procedure, and there were no complications apparent.

## 2017-04-18 NOTE — Telephone Encounter (Signed)
Pt advised of pre op date/time and sx date. Sx: 04/18/17 with Dr [name]--I&D of abscess groin-Left.  Pre op: Today after clinic.

## 2017-04-18 NOTE — Interval H&P Note (Signed)
History and Physical Interval Note:  04/18/2017 5:20 PM  Dawn Ortiz  has presented today for surgery, with the diagnosis of Left groin abscess  The various methods of treatment have been discussed with the patient and family. After consideration of risks, benefits and other options for treatment, the patient has consented to  Procedure(s): INCISION AND DRAINAGE ABSCESS-LEFT GROIN (Left) as a surgical intervention .  The patient's history has been reviewed, patient examined, no change in status, stable for surgery.  I have reviewed the patient's chart and labs.  Questions were answered to the patient's satisfaction.     Ancil Linsey

## 2017-04-19 ENCOUNTER — Encounter: Payer: Self-pay | Admitting: Surgery

## 2017-04-23 ENCOUNTER — Other Ambulatory Visit: Payer: Self-pay

## 2017-04-26 ENCOUNTER — Encounter: Payer: Self-pay | Admitting: Surgery

## 2017-04-26 ENCOUNTER — Ambulatory Visit (INDEPENDENT_AMBULATORY_CARE_PROVIDER_SITE_OTHER): Payer: Managed Care, Other (non HMO) | Admitting: Surgery

## 2017-04-26 VITALS — BP 144/89 | HR 102 | Temp 98.7°F | Wt 191.0 lb

## 2017-04-26 DIAGNOSIS — L02416 Cutaneous abscess of left lower limb: Secondary | ICD-10-CM

## 2017-04-26 NOTE — Progress Notes (Signed)
Surgical Clinic Progress/Follow-up Note   HPI:  27 y.o. Female presents to clinic for post-op follow-up evaluation 8 Days s/p incision and drainage of recurrent Left groin abscess. Patient reports the drainage from her wound ceased 3 - 4 days ago, since which her Penrose drain placed at the time of surgery "fell out" yesterday, and that she has one more day of antibiotics before completing the 10 Day course prescribed by her PMD. She otherwise denies fever/chills, N/V, CP, or SOB and says that her blood glucose has been "mostly better", affirming < 200 when asked. Patient also describes significant improvement of Right inframammary pain and induration over the past 3 - 4 days. Of note, patient also expresses recognition of the association between skin infections and diabetes and between diabetes and her obesity, says she has long attempted to loose weight with only temporary success at best.  Review of Systems:  Constitutional: denies any other weight loss, fever, chills, or sweats  Eyes: denies any other vision changes, history of eye injury  ENT: denies sore throat, hearing problems  Respiratory: denies shortness of breath, wheezing  Cardiovascular: denies chest pain, palpitations  Gastrointestinal: abdominal pain, N/V, and bowel function as per HPI Musculoskeletal: denies any other joint pains or cramps  Skin: Denies any other rashes or skin discolorations  Neurological: denies any other headache, dizziness, weakness  Psychiatric: denies any other depression, anxiety  All other review of systems: otherwise negative   Vital Signs:  BP (!) 144/89   Pulse (!) 102   Temp 98.7 F (37.1 C) (Oral)   Wt 191 lb (86.6 kg)   BMI 34.93 kg/m    Physical Exam:  Constitutional:  -- Obese body habitus  -- Awake, alert, and oriented x3  Eyes:  -- Pupils equally round and reactive to light  -- No scleral icterus  Ear, nose, throat:  -- No jugular venous distension  -- No nasal drainage,  bleeding Pulmonary:  -- No crackles -- Equal breath sounds bilaterally -- Breathing non-labored at rest Cardiovascular:  -- S1, S2 present  -- No pericardial rubs  Gastrointestinal:  -- Soft, nontender, non-distended, no guarding/rebound  -- No abdominal masses appreciated, pulsatile or otherwise  Musculoskeletal / Integumentary:  -- Wounds or skin discoloration: Left groin wound (former abscess) soft and non-tender with appropriate granulation tissue and no surrounding erythema or purulent drainage, mildly tender Right inframammary induration without erythema or fluctuance -- Extremities: B/L UE and LE FROM, hands and feet warm, no edema  Neurologic:  -- Motor function: intact and symmetric  -- Sensation: intact and symmetric    Assessment:  26 y.o. yo Female with a problem list including...  Patient Active Problem List   Diagnosis Date Noted  . Abscess of left thigh   . Axillary abscess 07/02/2016    presents to clinic for post-op follow-up evaluation, doing well s/p incision and drainage of recurrent Left medial thigh abscess, complicated by comorbidities including obesity and until recently poorly controlled DM (recent HbA1c ~13).  Plan:   - complete prescribed course of antibiotics even if feeling better/well   - optimization of blood glucose control and weight loss advised and discussed  - coordinated bariatric/weight loss program and surgery information provided per patient request  - return to clinic as needed, instructed to call office if any questions or concerns  All of the above recommendations were discussed with the patient, and all of patient's questions were answered to her expressed satisfaction.  -- Scherrie Gerlach Earlene Plater, MD, RPVI  : [facility] for exceptional care. Office: [phone]

## 2017-04-26 NOTE — Patient Instructions (Signed)
Please give us a call in case you have any questions or concerns.  

## 2017-04-27 LAB — AEROBIC/ANAEROBIC CULTURE (SURGICAL/DEEP WOUND)

## 2017-04-27 LAB — AEROBIC/ANAEROBIC CULTURE W GRAM STAIN (SURGICAL/DEEP WOUND)

## 2017-05-27 ENCOUNTER — Other Ambulatory Visit: Payer: Self-pay | Admitting: Physician Assistant

## 2017-10-16 ENCOUNTER — Ambulatory Visit: Payer: Self-pay | Admitting: Family Medicine

## 2017-10-16 ENCOUNTER — Encounter: Payer: Self-pay | Admitting: Family Medicine

## 2017-10-16 VITALS — BP 149/87 | HR 109 | Temp 98.6°F | Resp 20

## 2017-10-16 DIAGNOSIS — J069 Acute upper respiratory infection, unspecified: Secondary | ICD-10-CM

## 2017-10-16 DIAGNOSIS — R112 Nausea with vomiting, unspecified: Secondary | ICD-10-CM

## 2017-10-16 LAB — POCT INFLUENZA A/B
INFLUENZA A, POC: NEGATIVE
INFLUENZA B, POC: NEGATIVE

## 2017-10-16 MED ORDER — ONDANSETRON 4 MG PO TBDP: 4.0000 mg | ORAL_TABLET | Freq: Three times a day (TID) | ORAL | 0 refills | Status: DC | PRN

## 2017-10-16 NOTE — Progress Notes (Signed)
Subjective: Congestion     Dawn Ortiz is a 27 y.o. female who presents for evaluation of nasal congestion, mild dry cough, low-grade fever (T-max 100 but is been taking antipyretics), headache, nausea, vomiting, diarrhea, chills, body aches, fatigue, sneezing, and sore throat since yesterday.  Reports one episode of vomiting, nonbilious nonbloody.  Reports 2 episodes of diarrhea, nonbloody, no mucus.  These have resolved currently but the patient still has nausea.  Patient reports sudden onset of symptoms yesterday afternoon.  Patient reports multiple coworkers are sick, some of which have the flu.  Patient has a history of high resting heart rate, denies cardiac symptoms.  Patient currently is able to tolerate oral fluids and eat but has decreased appetite due to nausea and therefore has not had much to drink or eat today. Treatment to date: DayQuil.  Denies SOB, wheezing,chest or back pain, ear pain,  difficulty swallowing, confusion,facial pressure/unilateral facial pain, or pain exacerbated by bending over.  History of smoking, asthma, COPD: Asthma as a child that she reports has resolved, otherwise negative. History of recurrent sinus and/or lung infections: Negative. Medical history: Diabetes.  Denies any other medical history. PCP: [facility].  Review of Systems Pertinent items noted in HPI and remainder of comprehensive ROS otherwise negative.     Objective:   Physical Exam General: Awake, alert, and oriented. No acute distress. Well developed, hydrated and nourished. Appears stated age. Nontoxic appearance.  HEENT:  PND noted.  Mild erythema to posterior oropharynx.  No edema or exudates of pharynx or tonsils. No erythema or bulging of TM.  Mild erythema/edema to nasal mucosa. Sinuses nontender. Supple neck without adenopathy.  Mucous membranes moist. Cardiac: Heart rate and rhythm are normal. No murmurs, gallops, or rubs are auscultated. S1 and S2 are heard and are of normal  intensity.  Heart rate 100 observed by provider.  Patient reports she has a history of a high resting heart rate, normal for her is between 100 and 120. Respiratory: No signs of respiratory distress. Lungs clear. No tachypnea. Able to speak in full sentences without dyspnea. Nonlabored respirations.  Skin: Skin is warm, dry and intact. Appropriate color for ethnicity. No cyanosis noted.  No signs of dehydration or skin tenting.  Diagnostic Results: Flu swab negative.  Assessment:    viral upper respiratory illness   Plan:    Discussed diagnosis and treatment of URI. Discussed the importance of avoiding unnecessary antibiotic therapy. Suggested symptomatic OTC remedies. Nasal saline spray for congestion.   Prescribe Zofran for nausea so that the patient is able to drink plenty of fluids to remain hydrated and eat an adequate amount.  Patient educated on signs of dehydration and informed if she cannot drink fluids or eat that she needs to go to the hospital.  Discussed the best oral fluids and encouraged fluid and food intake. Patient has levofloxacin and paroxetine on her medication list but reports that she has not taken these within the last month.  Advised not to take these as these interact with Zofran. Blood pressure elevated at 149/87.  Patient informed of this and normal values.  Educated to monitor this at home and to report if she continues to have elevated blood pressure to her primary care provider.  This is possibly due to over-the-counter medications taken. Follow-up with primary care provider. Discussed red flag symptoms and circumstances with which to seek medical care.   New Prescriptions   ONDANSETRON (ZOFRAN ODT) 4 MG DISINTEGRATING TABLET    Take 1  tablet (4 mg total) by mouth every 8 (eight) hours as needed for nausea or vomiting.

## 2017-10-21 ENCOUNTER — Ambulatory Visit: Payer: Self-pay | Admitting: Family Medicine

## 2017-10-21 ENCOUNTER — Encounter: Payer: Self-pay | Admitting: Family Medicine

## 2017-10-21 VITALS — BP 132/84 | HR 116 | Temp 98.8°F | Resp 20 | Ht 62.0 in | Wt 176.0 lb

## 2017-10-21 DIAGNOSIS — Z008 Encounter for other general examination: Secondary | ICD-10-CM

## 2017-10-21 DIAGNOSIS — Z0189 Encounter for other specified special examinations: Principal | ICD-10-CM

## 2017-10-21 DIAGNOSIS — J019 Acute sinusitis, unspecified: Secondary | ICD-10-CM

## 2017-10-21 LAB — GLUCOSE, POCT (MANUAL RESULT ENTRY): POC GLUCOSE: 297 mg/dL — AB (ref 70–99)

## 2017-10-21 LAB — HM PAP SMEAR: HM Pap smear: NORMAL

## 2017-10-21 MED ORDER — AMOXICILLIN-POT CLAVULANATE 875-125 MG PO TABS: 1.0000 | ORAL_TABLET | Freq: Two times a day (BID) | ORAL | 0 refills | Status: AC

## 2017-10-21 NOTE — Progress Notes (Signed)
Subjective: Congestion and biometric screening    10/16/17:   Dawn LenisMyra J Ortiz is a 27 y.o. female who presents for evaluation of nasal congestion, mild dry cough, low-grade fever (T-max 100 but is been taking antipyretics), headache, nausea, vomiting, diarrhea, chills, body aches, fatigue, sneezing, and sore throat since yesterday.  Reports one episode of vomiting, nonbilious nonbloody.  Reports 2 episodes of diarrhea, nonbloody, no mucus.  These have resolved currently but the patient still has nausea.  Patient reports sudden onset of symptoms yesterday afternoon.  Patient reports multiple coworkers are sick, some of which have the flu.  Patient has a history of high resting heart rate, denies cardiac symptoms.  Patient currently is able to tolerate oral fluids and eat but has decreased appetite due to nausea and therefore has not had much to drink or eat today. Treatment to date: DayQuil.  Denies SOB, wheezing,chest or back pain, ear pain,  difficulty swallowing, confusion,facial pressure/unilateral facial pain, or pain exacerbated by bending over.  History of smoking, asthma, COPD: Asthma as a child that she reports has resolved, otherwise negative. History of recurrent sinus and/or lung infections: Negative. Medical history: Diabetes.  Denies any other medical history. PCP: [facility]. 10/21/17: Patient presents today for her annual biometric screening but is also having continued URI symptoms.  Patient reports resolution of her nausea, vomiting, diarrhea, and sore throat.  Patient is now able to drink and eat more but still has decreased appetite.  Denies any symptoms of dehydration.  Patient reports continued intermittent low-grade fever (T-max 100.0).  She reports left-sided facial pressure and increased purulent drainage from her left nares.  Symptoms have now been going on for 6 days.  Patient denies any antibiotic use in the last 3 months.  Patient reports chest wall muscle tenderness with  coughing.  Denies shortness of breath, wheezing, chest/back pain, ear pain, difficulty swallowing, confusion, or malaise.  Patient reports continued mild dry cough.  Continued mild fatigue.  Patient's fasting blood sugar is elevated today at 297, patient reports it is usually is elevated like this when she is sick due to over-the-counter medications and her diabetes.  Patient reports compliance with her diabetes medications and regularly checking her blood sugar.  Denies any other symptoms or complaints.  Review of Systems Pertinent items noted in HPI and remainder of comprehensive ROS otherwise negative.     Objective:   10/16/17: Physical Exam General: Awake, alert, and oriented. No acute distress. Well developed, hydrated and nourished. Appears stated age. Nontoxic appearance.  HEENT:  PND noted.  Mild erythema to posterior oropharynx.  No edema or exudates of pharynx or tonsils. No erythema or bulging of TM.  Mild erythema/edema to nasal mucosa. Sinuses nontender. Supple neck without adenopathy.  Mucous membranes moist. Cardiac: Heart rate and rhythm are normal. No murmurs, gallops, or rubs are auscultated. S1 and S2 are heard and are of normal intensity.  Heart rate 100 observed by provider.  Patient reports she has a history of a high resting heart rate, normal for her is between 100 and 120. Respiratory: No signs of respiratory distress. Lungs clear. No tachypnea. Able to speak in full sentences without dyspnea. Nonlabored respirations.  Skin: Skin is warm, dry and intact. Appropriate color for ethnicity. No cyanosis noted.  No signs of dehydration or skin tenting.  Diagnostic Results: Flu swab negative.  10/21/17: Physical exam General: Awake, alert, and oriented. No acute distress. Well developed, hydrated and nourished. Appears stated age. Nontoxic appearance.  HEENT:  PND  noted.  No erythema to posterior oropharynx.  No edema or exudates of pharynx or tonsils. No erythema or bulging of TM.   Mild erythema/edema to nasal mucosa. Sinuses nontender. Supple neck without adenopathy.  Mucous membranes moist. Cardiac: Heart rate and rhythm are normal. No murmurs, gallops, or rubs are auscultated. S1 and S2 are heard and are of normal intensity.  Heart rate 108 observed by provider.  Patient reports she has a history of a high resting heart rate, normal for her is between 100 and 120.  +chest wall muscle tenderness.  Respiratory: No signs of respiratory distress. Lungs clear. No tachypnea. Able to speak in full sentences without dyspnea. Nonlabored respirations.  Skin: Skin is warm, dry and intact. Appropriate color for ethnicity. No cyanosis noted.  No signs of dehydration or skin tenting.  Assessment:   Sinusitis Biometric screening  Plan:  Sinusitis: Augmentin prescribed. Suggested symptomatic OTC remedies. Nasal saline spray for congestion.   Drink plenty of fluids to remain hydrated and eat an adequate amount.   Patient educated on signs of dehydration and informed if she cannot drink fluids or eat that she needs to go to the hospital.  Discussed the best oral fluids and encouraged fluid and food intake. Follow-up with primary care provider this week. Discussed red flag symptoms and circumstances with which to seek medical care.  Biometric screening: Lipid panel pending.  Discussed fasting blood sugar with patient and informed her to see her primary care provider this week.  Advised patient to closely monitor her blood sugar while she she is sick.  New Prescriptions   AMOXICILLIN-CLAVULANATE (AUGMENTIN) 875-125 MG TABLET    Take 1 tablet by mouth 2 (two) times daily for 10 days.

## 2017-10-22 LAB — LIPID PANEL
CHOL/HDL RATIO: 5 ratio — AB (ref 0.0–4.4)
CHOLESTEROL TOTAL: 151 mg/dL (ref 100–199)
HDL: 30 mg/dL — AB (ref >39)
LDL Calculated: 94 mg/dL (ref 0–99)
TRIGLYCERIDES: 136 mg/dL (ref 0–149)
VLDL Cholesterol Cal: 27 mg/dL (ref 5–40)

## 2017-10-22 NOTE — Progress Notes (Signed)
Dawn Ortiz, I wanted to inform you that your lipid panel came back and everything is normal with the exception of your HDL cholesterol ("good cholesterol"), which is low at 30.  Normal values are greater than 39.  This increases your cholesterol/HDL ratio to 5 (normal is between 0 and 4.4), increasing your risk for cardiovascular disease.  I wanted to inform you of this so that you could discuss this with your primary care provider at your next regularly scheduled visit.

## 2018-02-27 ENCOUNTER — Ambulatory Visit: Payer: Self-pay | Admitting: Physician Assistant

## 2018-02-27 VITALS — BP 137/83 | HR 105 | Temp 98.9°F | Resp 20

## 2018-02-27 DIAGNOSIS — R079 Chest pain, unspecified: Secondary | ICD-10-CM

## 2018-02-27 MED ORDER — FAMOTIDINE 40 MG PO TABS: 40.0000 mg | ORAL_TABLET | Freq: Every day | ORAL | 0 refills | Status: DC

## 2018-02-27 MED ORDER — SUCRALFATE 1 G PO TABS: 1.0000 g | ORAL_TABLET | Freq: Three times a day (TID) | ORAL | 0 refills | Status: DC

## 2018-02-27 MED ORDER — ONDANSETRON HCL 4 MG PO TABS: 4.0000 mg | ORAL_TABLET | Freq: Three times a day (TID) | ORAL | 0 refills | Status: DC | PRN

## 2018-02-27 NOTE — Progress Notes (Signed)
27 year old female presents to [facility] with 1-1/2 week history of acid reflux symptoms. Reports belching/burping, sternal and epigastric burning/discomfort, early satiety, and bloating/distension sensation. Has slightly lessened in severity, but has continued. Patient for past 5-6 days has had associated chest pressure and tingling sensation. Associated shortness of breath. Simple exertion, that normally would not cause patient to feel winded or out of breath, such as walking to the car, now causes exhaustion feeling. Patient yesterday had a sense of fatigue. And today patient reports mild nausea and lightheaded sensation. Patient has taken over the  Counter Tums and Omeprazole for the GERD, no improvement. Has taken OTC Aleve and Tylenol for the chest pressure/pain, no improvement.  Patient is an insulin dependent diabetic, since childhood. Sugars have been per her normal, fasting this morning ws 180.  Patient denies fever, chills, headache, URI/cold symptoms, cough, wheezing, abdominal pain, vomiting, rash.  Patient with known tachycardia. Underwent cardiac workup in Sept 2017 with [facility] [place]. EKG with sinus tachycardia at 108bpm with nonspecific T-wave changes (suspected due to rate). Echo WNL. Stress echocardiogram WNL. Holter monitor WNL other than 80% of the time patient is in tachycardia. Patient offered beta blocker for symptom improvement, declined.  Patient with co-worker who was recently diagnosed with pericarditis; states symptoms were the same as hers.  O: VSS other than heart rate of 105bpm (per patient's normal). Afebrile. Sitting comfortably on examination table. In no acute distress. Hear rate regular. Tachycardic rhythm. No murmur. Chest nontender. No change in chest pain/pressure with leaning forward or lying flat. No audible friction rub. Lung clear to auscultation. Abdomen soft. Nondistended. Nontender.  A: Gastritis/GERD  Will prescribe H2 blocker and  Carafate. Will prescribe Zofran for nausea.  EKG performed today revealed normal sinus rhythm, ventricular rate of 100bpm. No ectopy. Nonspecific T-wave changes. No change compared to previous EKG.  Atypical chest pain. Due to recent, Sept 2017, cardiac work-up. Low likelihood patient with acute cardiac event warranting ER evaluation. However, advised patient follow-up with [facility] for further evaluation. Also, instructed the patient to go immediately to the ER if chest pain or shortness of breath worsens, or if new/concerning symptom arises.  Patient given work note for today, at her request.

## 2018-02-27 NOTE — Patient Instructions (Signed)
An EKG was performed in the office today, no changes from your previous EKG.  Recommend avoiding spicy foods and large meals. Try not to eat less than 2 hours before going to bed.  Take medication as prescribed.  Recommend, if chest pain and shortness of breath persists, that you follow-up with [facility], your cardiologist.  If chest pain worsens, you should go immediately to the ER for further evaluation and treatment.

## 2018-04-08 ENCOUNTER — Emergency Department
Admission: EM | Admit: 2018-04-08 | Discharge: 2018-04-08 | Disposition: A | Discharge status: Home / Self Care | Payer: Managed Care, Other (non HMO) | Attending: Emergency Medicine | Admitting: Emergency Medicine

## 2018-04-08 ENCOUNTER — Encounter: Payer: Self-pay | Admitting: Emergency Medicine

## 2018-04-08 ENCOUNTER — Other Ambulatory Visit: Payer: Self-pay | Admitting: Emergency Medicine

## 2018-04-08 ENCOUNTER — Emergency Department
Admit: 2018-04-08 | Discharge: 2018-04-08 | Disposition: A | Discharge status: Home / Self Care | Payer: Managed Care, Other (non HMO) | Attending: Family Medicine | Admitting: Family Medicine

## 2018-04-08 ENCOUNTER — Ambulatory Visit
Admission: RE | Admit: 2018-04-08 | Discharge: 2018-04-08 | Disposition: A | Discharge status: Home / Self Care | Payer: Managed Care, Other (non HMO) | Source: Ambulatory Visit | Attending: Emergency Medicine | Admitting: Emergency Medicine

## 2018-04-08 ENCOUNTER — Other Ambulatory Visit: Payer: Self-pay

## 2018-04-08 DIAGNOSIS — N644 Mastodynia: Secondary | ICD-10-CM | POA: Diagnosis not present

## 2018-04-08 DIAGNOSIS — N611 Abscess of the breast and nipple: Secondary | ICD-10-CM

## 2018-04-08 DIAGNOSIS — Z79899 Other long term (current) drug therapy: Secondary | ICD-10-CM | POA: Diagnosis not present

## 2018-04-08 DIAGNOSIS — Z87891 Personal history of nicotine dependence: Secondary | ICD-10-CM | POA: Insufficient documentation

## 2018-04-08 DIAGNOSIS — E119 Type 2 diabetes mellitus without complications: Secondary | ICD-10-CM | POA: Diagnosis not present

## 2018-04-08 DIAGNOSIS — Z794 Long term (current) use of insulin: Secondary | ICD-10-CM | POA: Insufficient documentation

## 2018-04-08 MED ORDER — OXYCODONE-ACETAMINOPHEN 5-325 MG PO TABS: 1.0000 | ORAL_TABLET | Freq: Four times a day (QID) | ORAL | 0 refills | Status: DC | PRN

## 2018-04-08 MED ORDER — CLINDAMYCIN HCL 300 MG PO CAPS: 300.0000 mg | ORAL_CAPSULE | Freq: Three times a day (TID) | ORAL | 0 refills | Status: AC

## 2018-04-08 MED ORDER — ONDANSETRON 4 MG PO TBDP: 4.0000 mg | ORAL_TABLET | Freq: Three times a day (TID) | ORAL | 0 refills | Status: AC | PRN

## 2018-04-08 NOTE — ED Provider Notes (Signed)
[facility] Emergency Department Provider Note  ____________________________________________  Time seen: Approximately 4:55 PM  I have reviewed the triage vital signs and the nursing notes.   HISTORY  Chief Complaint Abscess    HPI Dawn Ortiz is a 27 y.o. female presents to the emergency department with 10 out of 10 aching and throbbing right inferior breast pain.  Patient reports that she has a history of cutaneous and breast abscesses.  Patient has had needle aspiration of 2 right medial breast abscesses in the past.  Patient reports that she developed right breast pain approximately 3 days ago and sought care with urgent care who prescribed Keflex.  Patient reports that she has felt "bad" since breast symptoms that started.  She has had chills but no fever.  No discharge from the nipple, night sweats or weight loss or weight gain.  No other alleviating measures have been attempted.   Past Medical History:  Diagnosis Date  . Anxiety   . Diabetes mellitus without complication (HCC)   . GERD (gastroesophageal reflux disease)     Patient Active Problem List   Diagnosis Date Noted  . Abscess of left thigh   . Axillary abscess 07/02/2016    Past Surgical History:  Procedure Laterality Date  . INCISION AND DRAINAGE    . INCISION AND DRAINAGE ABSCESS Left 07/02/2016   Procedure: INCISION AND DRAINAGE ABSCESS;  Surgeon: Lattie Haw, MD;  Location: [facility] ORS;  Service: General;  Laterality: Left;  . INCISION AND DRAINAGE ABSCESS Left 04/18/2017   Procedure: INCISION AND DRAINAGE ABSCESS-LEFT GROIN;  Surgeon: Ancil Linsey, MD;  Location: [facility] ORS;  Service: General;  Laterality: Left;  . PILONIDAL CYST EXCISION  12/03/2014   Procedure: CYST EXCISION PILONIDAL EXTENSIVE;  Surgeon: Duwaine Maxin, MD;  Location: [facility] ORS;  Service: General;;    Prior to Admission medications   Medication Sig Start Date End Date Taking? Authorizing Provider   etonogestrel (NEXPLANON) 68 MG IMPL implant 1 each by Subdermal route once.   Yes [provider]  insulin aspart (NOVOLOG) 100 UNIT/ML injection Inject 25 Units into the skin 3 (three) times daily with meals.   Yes [provider]  insulin detemir (LEVEMIR) 100 UNIT/ML injection Inject 50 Units into the skin 2 (two) times daily.   Yes [provider]  lisinopril (PRINIVIL,ZESTRIL) 2.5 MG tablet Take 2.5 mg by mouth daily.   Yes [provider]  PARoxetine (PAXIL) 10 MG tablet TAKE 1 TABLET(10 MG) BY MOUTH AT BEDTIME 05/27/17  Yes Fisher, Roselyn Bering, PA-C  clindamycin (CLEOCIN) 300 MG capsule Take 1 capsule (300 mg total) by mouth 3 (three) times daily for 10 days. 04/08/18 04/18/18  Orvil Feil, PA-C  famotidine (PEPCID) 40 MG tablet Take 1 tablet (40 mg total) by mouth daily for 14 days. 02/27/18 03/13/18  Janalyn Harder, PA-C  ondansetron (ZOFRAN ODT) 4 MG disintegrating tablet Take 1 tablet (4 mg total) by mouth every 8 (eight) hours as needed for up to 3 days for nausea or vomiting. 04/08/18 04/11/18  Orvil Feil, PA-C  ondansetron (ZOFRAN) 4 MG tablet Take 1 tablet (4 mg total) by mouth every 8 (eight) hours as needed for nausea or vomiting. 02/27/18   Janalyn Harder, PA-C  oxyCODONE-acetaminophen (PERCOCET/ROXICET) 5-325 MG tablet Take 1 tablet by mouth every 6 (six) hours as needed for up to 3 days for severe pain. 04/08/18 04/11/18  Orvil Feil, PA-C  sucralfate (CARAFATE) 1 g tablet Take 1 tablet (  1 g total) by mouth 4 (four) times daily -  with meals and at bedtime for 7 days. 02/27/18 03/06/18  Janalyn Harder, PA-C    Allergies Sulfa antibiotics  Family History  Problem Relation Age of Onset  . Healthy Mother   . Hypertension Father   . Diabetes Father   . Heart disease Father   . Healthy Brother     Social History Social History   Tobacco Use  . Smoking status: Former Smoker    Types: Cigarettes  . Smokeless tobacco: Never Used   Substance Use Topics  . Alcohol use: Yes    Alcohol/week: 1.0 - 2.0 standard drinks    Types: 1 - 2 Glasses of wine per week    Comment: ocassional  . Drug use: No     Review of Systems  Constitutional: Patient has had chills.  Eyes: No visual changes. No discharge ENT: No upper respiratory complaints. Cardiovascular: no chest pain. Respiratory: no cough. No SOB. Gastrointestinal: No abdominal pain.  No nausea, no vomiting.  No diarrhea.  No constipation. Musculoskeletal: Negative for musculoskeletal pain. Skin: Patient has right breast pain.  Neurological: Negative for headaches, focal weakness or numbness.   ____________________________________________   PHYSICAL EXAM:  VITAL SIGNS: ED Triage Vitals [04/08/18 1412]  Enc Vitals Group     BP (!) 158/102     Pulse Rate (!) 116     Resp 16     Temp 99 F (37.2 C)     Temp Source Oral     SpO2 100 %     Weight 185 lb (83.9 kg)     Height 5\' 2"  (1.575 m)     Head Circumference      Peak Flow      Pain Score 8     Pain Loc      Pain Edu?      Excl. in GC?      Constitutional: Alert and oriented. Well appearing and in no acute distress. Eyes: Conjunctivae are normal. PERRL. EOMI. Head: Atraumatic. Cardiovascular: Normal rate, regular rhythm. Normal S1 and S2.  Good peripheral circulation. Respiratory: Normal respiratory effort without tachypnea or retractions. Lungs CTAB. Good air entry to the bases with no decreased or absent breath sounds. Gastrointestinal: Bowel sounds 4 quadrants. Soft and nontender to palpation. No guarding or rigidity. No palpable masses. No distention. No CVA tenderness. Musculoskeletal: Full range of motion to all extremities. No gross deformities appreciated. Neurologic:  Normal speech and language. No gross focal neurologic deficits are appreciated.  Skin: Patient localizes pain to inferior aspect of right breast.  There is no focal region of erythema.  No induration or palpable  fluctuance appreciated on physical exam. Psychiatric: Mood and affect are normal. Speech and behavior are normal. Patient exhibits appropriate insight and judgement.   ____________________________________________   LABS (all labs ordered are listed, but only abnormal results are displayed)  Labs Reviewed - No data to display ____________________________________________  EKG   ____________________________________________  RADIOLOGY I personally viewed and evaluated these images as part of my medical decision making, as well as reviewing the written report by the radiologist.  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 Patient was seen in urgent care yesterday and presented to the ER today. EXAM: ULTRASOUND OF THE RIGHT BREAST COMPARISON:  Previous exam(s). FINDINGS: On physical exam, there is diffuse swelling and  marked tenderness along the inferior aspect of the patient's right breast. She is very tender to touch and is having difficulty tolerating within light touch. Targeted ultrasound is performed, showing an oval, circumscribed cystic mass with internal echoes at the 5:30 position 7 cm from nipple. It measures 2.7 x 1.8 x 1.7 cm. There is peripheral but no internal vascularity. IMPRESSION: Right breast abscess corresponding with the patient's clinical symptoms. RECOMMENDATION: 1. Recommendation is for ultrasound-guided percutaneous drainage of the right breast abscess. This was attempted at the time of evaluation. However, the patient could not tolerate the procedure due to pain. She states that in the past the only curative measure was surgical incision and drainage. She has an appointment with [facility] on 04/09/2018 at 11 a.m. She would like to defer drainage at this time and keep her surgical appointment. 2. Additional  recommendation to complete antibiotic course as prescribed by the ED. 3. Findings and recommendation were discussed with Pia Mau, PA, in the ED. 4. Additional imaging follow-up can be performed as clinically indicated. I have discussed the findings and recommendations with the patient. Results were also provided in writing at the conclusion of the visit. If applicable, a reminder letter will be sent to the patient regarding the next appointment. BI-RADS CATEGORY  2: Benign. Electronically Signed   By: Sande Brothers M.D.   On: 04/08/2018 16:41    ____________________________________________    PROCEDURES  Procedure(s) performed:    Procedures    Medications - No data to display   ____________________________________________   INITIAL IMPRESSION / ASSESSMENT AND PLAN / ED COURSE  Pertinent labs & imaging results that were available during my care of the patient were reviewed by me and considered in my medical decision making (see chart for details).  Review of the [place] CSRS was performed in accordance of the [facility] prior to dispensing any controlled drugs.      Assessment and plan Breast abscess Patient presents to the emergency department with 10 out of 10 throbbing right breast pain.  Patient reports that she has had symptoms for the past 3 days.  I contacted ultrasound at [facility] and ultrasound relayed that they could do a localized soft tissue ultrasound of the breast but would not be able to conduct an ultrasound of the entire right breast.  As patient has had breast abscesses in the past, I am concerned about tracking.  There is also a lack of focal erythema, induration or palpable fluctuance on physical exam making physical exam findings nonspecific for abscess location.  The [place] [facility] was contacted and they were able to provide an appointment for patient to be seen same day.  Patient underwent an evaluation and ultrasound examination by  Dr. Randa Spike, who graciously offered to perform ultrasound-guided needle aspiration for patient's breast abscess while at the [facility].  Patient refused needle guided aspiration 2 different times while at [facility] stating that she had an appointment with general surgery to be seen as an outpatient tomorrow.  Dr. Randa Spike contacted me via phone to inform me of patient's decision.  Patient returned to the emergency department and was provided a short prescription for Roxicet.  She was also transitioned to clindamycin for MRSA coverage.  All patient questions were answered.    ____________________________________________  FINAL CLINICAL IMPRESSION(S) / ED DIAGNOSES  Final diagnoses:  Breast pain      NEW MEDICATIONS STARTED DURING THIS VISIT:  ED Discharge Orders  Ordered    oxyCODONE-acetaminophen (PERCOCET/ROXICET) 5-325 MG tablet  Every 6 hours PRN     04/08/18 1646    ondansetron (ZOFRAN ODT) 4 MG disintegrating tablet  Every 8 hours PRN     04/08/18 1646    clindamycin (CLEOCIN) 300 MG capsule  3 times daily     04/08/18 1646              This chart was dictated using voice recognition software/Dragon. Despite best efforts to proofread, errors can occur which can change the meaning. Any change was purely unintentional.    Orvil Feil, PA-C 04/08/18 1703    Nita Sickle, MD 04/08/18 [phone]

## 2018-04-08 NOTE — ED Notes (Addendum)
Discharge was done by [place]. Pt was having appt at breast center at this time. PT was not able to sign esignature due to PA discharging

## 2018-04-08 NOTE — ED Triage Notes (Signed)
Pt comes into the ED via POV c/o abscess on the right breast and a bruised spot on the right side.  Patient states she has a h/o of these recurrent abscess and states she was given antibiotics yesterday from urgent care.  Patient in NAD at this time with even and unlabored respirations.  Patient ambulatory to triage.

## 2018-04-09 ENCOUNTER — Ambulatory Visit: Payer: Managed Care, Other (non HMO) | Admitting: Certified Registered Nurse Anesthetist

## 2018-04-09 ENCOUNTER — Encounter: Payer: Self-pay | Admitting: Surgery

## 2018-04-09 ENCOUNTER — Ambulatory Visit
Admission: RE | Admit: 2018-04-09 | Discharge: 2018-04-09 | Disposition: A | Discharge status: Home / Self Care | Payer: Managed Care, Other (non HMO) | Source: Ambulatory Visit | Attending: Surgery | Admitting: Surgery

## 2018-04-09 ENCOUNTER — Ambulatory Visit (INDEPENDENT_AMBULATORY_CARE_PROVIDER_SITE_OTHER): Payer: Managed Care, Other (non HMO) | Admitting: Surgery

## 2018-04-09 ENCOUNTER — Encounter: Payer: Self-pay | Admitting: *Deleted

## 2018-04-09 ENCOUNTER — Encounter
Admission: RE | Disposition: A | Discharge status: Home / Self Care | Payer: Self-pay | Source: Ambulatory Visit | Attending: Surgery

## 2018-04-09 VITALS — BP 122/74 | HR 74 | Temp 97.4°F | Resp 12 | Ht 62.0 in | Wt 182.0 lb

## 2018-04-09 DIAGNOSIS — E119 Type 2 diabetes mellitus without complications: Secondary | ICD-10-CM | POA: Diagnosis not present

## 2018-04-09 DIAGNOSIS — Z794 Long term (current) use of insulin: Secondary | ICD-10-CM | POA: Insufficient documentation

## 2018-04-09 DIAGNOSIS — N611 Abscess of the breast and nipple: Secondary | ICD-10-CM | POA: Insufficient documentation

## 2018-04-09 DIAGNOSIS — F419 Anxiety disorder, unspecified: Secondary | ICD-10-CM | POA: Insufficient documentation

## 2018-04-09 DIAGNOSIS — Z8249 Family history of ischemic heart disease and other diseases of the circulatory system: Secondary | ICD-10-CM | POA: Insufficient documentation

## 2018-04-09 DIAGNOSIS — Z87891 Personal history of nicotine dependence: Secondary | ICD-10-CM | POA: Insufficient documentation

## 2018-04-09 DIAGNOSIS — Z882 Allergy status to sulfonamides status: Secondary | ICD-10-CM | POA: Insufficient documentation

## 2018-04-09 DIAGNOSIS — K219 Gastro-esophageal reflux disease without esophagitis: Secondary | ICD-10-CM | POA: Insufficient documentation

## 2018-04-09 DIAGNOSIS — Z79899 Other long term (current) drug therapy: Secondary | ICD-10-CM | POA: Diagnosis not present

## 2018-04-09 HISTORY — PX: IRRIGATION AND DEBRIDEMENT ABSCESS: SHX5252

## 2018-04-09 LAB — CBC WITH DIFFERENTIAL/PLATELET
BASOS ABS: 0 10*3/uL (ref 0–0.1)
Basophils Relative: 0 %
Eosinophils Absolute: 0 10*3/uL (ref 0–0.7)
Eosinophils Relative: 0 %
HEMATOCRIT: 38.5 % (ref 35.0–47.0)
HEMOGLOBIN: 12.7 g/dL (ref 12.0–16.0)
LYMPHS PCT: 16 %
Lymphs Abs: 2.3 10*3/uL (ref 1.0–3.6)
MCH: 26.2 pg (ref 26.0–34.0)
MCHC: 32.9 g/dL (ref 32.0–36.0)
MCV: 79.6 fL — AB (ref 80.0–100.0)
MONO ABS: 0.6 10*3/uL (ref 0.2–0.9)
Monocytes Relative: 4 %
NEUTROS ABS: 10.9 10*3/uL — AB (ref 1.4–6.5)
Neutrophils Relative %: 80 %
Platelets: 407 10*3/uL (ref 150–440)
RBC: 4.84 MIL/uL (ref 3.80–5.20)
RDW: 14.5 % (ref 11.5–14.5)
WBC: 13.8 10*3/uL — ABNORMAL HIGH (ref 3.6–11.0)

## 2018-04-09 LAB — POCT PREGNANCY, URINE: Preg Test, Ur: NEGATIVE

## 2018-04-09 LAB — GLUCOSE, CAPILLARY
Glucose-Capillary: 247 mg/dL — ABNORMAL HIGH (ref 70–99)
Glucose-Capillary: 230 mg/dL — ABNORMAL HIGH (ref 70–99)

## 2018-04-09 LAB — BASIC METABOLIC PANEL
ANION GAP: 12 (ref 5–15)
BUN: 12 mg/dL (ref 6–20)
CO2: 21 mmol/L — ABNORMAL LOW (ref 22–32)
Calcium: 9.8 mg/dL (ref 8.9–10.3)
Chloride: 104 mmol/L (ref 98–111)
Creatinine, Ser: 0.69 mg/dL (ref 0.44–1.00)
GFR calc Af Amer: >60 mL/min (ref >60)
GLUCOSE: 262 mg/dL — AB (ref 70–99)
POTASSIUM: 4.1 mmol/L (ref 3.5–5.1)
Sodium: 137 mmol/L (ref 135–145)

## 2018-04-09 SURGERY — IRRIGATION AND DEBRIDEMENT ABSCESS
Anesthesia: General | Site: Breast | Laterality: Right

## 2018-04-09 MED ORDER — PROPOFOL 10 MG/ML IV BOLUS
INTRAVENOUS | Status: AC
  Filled 2018-04-09: qty 20

## 2018-04-09 MED ORDER — PROPOFOL 10 MG/ML IV BOLUS
INTRAVENOUS | Status: DC | PRN
  Administered 2018-04-09: 200 mg via INTRAVENOUS

## 2018-04-09 MED ORDER — CLINDAMYCIN PHOSPHATE 900 MG/50ML IV SOLN
900.0000 mg | INTRAVENOUS | Status: AC
  Administered 2018-04-09: 900 mg via INTRAVENOUS

## 2018-04-09 MED ORDER — FENTANYL CITRATE (PF) 100 MCG/2ML IJ SOLN
INTRAMUSCULAR | Status: DC | PRN
  Administered 2018-04-09 (×4): 25 ug via INTRAVENOUS

## 2018-04-09 MED ORDER — BUPIVACAINE-EPINEPHRINE 0.5% -1:200000 IJ SOLN
INTRAMUSCULAR | Status: DC | PRN
  Administered 2018-04-09: 20 mL

## 2018-04-09 MED ORDER — DEXMEDETOMIDINE HCL 200 MCG/2ML IV SOLN
INTRAVENOUS | Status: DC | PRN
  Administered 2018-04-09: 8 ug via INTRAVENOUS
  Administered 2018-04-09 (×3): 4 ug via INTRAVENOUS

## 2018-04-09 MED ORDER — PROMETHAZINE HCL 25 MG/ML IJ SOLN: 6.2500 mg | INTRAMUSCULAR | Status: DC | PRN

## 2018-04-09 MED ORDER — GABAPENTIN 300 MG PO CAPS: 300.0000 mg | ORAL_CAPSULE | ORAL | Status: DC

## 2018-04-09 MED ORDER — ONDANSETRON HCL 4 MG/2ML IJ SOLN
INTRAMUSCULAR | Status: DC | PRN
  Administered 2018-04-09: 4 mg via INTRAVENOUS

## 2018-04-09 MED ORDER — ACETAMINOPHEN 500 MG PO TABS
1000.0000 mg | ORAL_TABLET | ORAL | Status: AC
  Administered 2018-04-09: 1000 mg via ORAL

## 2018-04-09 MED ORDER — LIDOCAINE HCL (PF) 2 % IJ SOLN
INTRAMUSCULAR | Status: AC
  Filled 2018-04-09: qty 10

## 2018-04-09 MED ORDER — GABAPENTIN 300 MG PO CAPS
ORAL_CAPSULE | ORAL | Status: AC
  Administered 2018-04-09: 300 mg
  Filled 2018-04-09: qty 1

## 2018-04-09 MED ORDER — LACTATED RINGERS IV SOLN
INTRAVENOUS | Status: DC | PRN
  Administered 2018-04-09: 14:00:00 via INTRAVENOUS

## 2018-04-09 MED ORDER — CHLORHEXIDINE GLUCONATE CLOTH 2 % EX PADS: 6.0000 | MEDICATED_PAD | Freq: Once | CUTANEOUS | Status: DC

## 2018-04-09 MED ORDER — ACETAMINOPHEN 500 MG PO TABS
ORAL_TABLET | ORAL | Status: AC
  Filled 2018-04-09: qty 2

## 2018-04-09 MED ORDER — FENTANYL CITRATE (PF) 100 MCG/2ML IJ SOLN
INTRAMUSCULAR | Status: AC
  Filled 2018-04-09: qty 2

## 2018-04-09 MED ORDER — MIDAZOLAM HCL 2 MG/2ML IJ SOLN
INTRAMUSCULAR | Status: DC | PRN
  Administered 2018-04-09: 2 mg via INTRAVENOUS

## 2018-04-09 MED ORDER — HYDROCODONE-ACETAMINOPHEN 5-325 MG PO TABS: 1.0000 | ORAL_TABLET | Freq: Four times a day (QID) | ORAL | 0 refills | Status: DC | PRN

## 2018-04-09 MED ORDER — IBUPROFEN 600 MG PO TABS: 600.0000 mg | ORAL_TABLET | Freq: Three times a day (TID) | ORAL | 0 refills | Status: DC | PRN

## 2018-04-09 MED ORDER — BUPIVACAINE-EPINEPHRINE (PF) 0.5% -1:200000 IJ SOLN
INTRAMUSCULAR | Status: AC
  Filled 2018-04-09: qty 30

## 2018-04-09 MED ORDER — CLINDAMYCIN PHOSPHATE 900 MG/50ML IV SOLN
INTRAVENOUS | Status: AC
  Filled 2018-04-09: qty 50

## 2018-04-09 MED ORDER — MIDAZOLAM HCL 2 MG/2ML IJ SOLN
INTRAMUSCULAR | Status: AC
  Filled 2018-04-09: qty 2

## 2018-04-09 MED ORDER — SODIUM CHLORIDE FLUSH 0.9 % IV SOLN
INTRAVENOUS | Status: AC
  Filled 2018-04-09: qty 10

## 2018-04-09 MED ORDER — FENTANYL CITRATE (PF) 100 MCG/2ML IJ SOLN: 25.0000 ug | INTRAMUSCULAR | Status: DC | PRN

## 2018-04-09 MED ORDER — DEXAMETHASONE SODIUM PHOSPHATE 10 MG/ML IJ SOLN
INTRAMUSCULAR | Status: DC | PRN
  Administered 2018-04-09: 5 mg via INTRAVENOUS

## 2018-04-09 MED ORDER — LIDOCAINE HCL (CARDIAC) PF 100 MG/5ML IV SOSY
PREFILLED_SYRINGE | INTRAVENOUS | Status: DC | PRN
  Administered 2018-04-09: 60 mg via INTRAVENOUS

## 2018-04-09 SURGICAL SUPPLY — 18 items
BNDG GAUZE 4.5X4.1 6PLY STRL (MISCELLANEOUS) ×2 IMPLANT
CHLORAPREP W/TINT 26ML (MISCELLANEOUS) ×2 IMPLANT
DRAIN PENROSE 1/4X12 LTX (DRAIN) ×2 IMPLANT
DRAPE LAPAROTOMY 77X122 PED (DRAPES) ×2 IMPLANT
ELECT REM PT RETURN 9FT ADLT (ELECTROSURGICAL) ×2
ELECTRODE REM PT RTRN 9FT ADLT (ELECTROSURGICAL) ×1 IMPLANT
GAUZE PACKING IODOFORM 1/2 (PACKING) ×2 IMPLANT
GAUZE SPONGE 4X4 12PLY STRL (GAUZE/BANDAGES/DRESSINGS) ×2 IMPLANT
GLOVE SURG SYN 7.0 (GLOVE) ×2 IMPLANT
GLOVE SURG SYN 7.5  E (GLOVE) ×1
GLOVE SURG SYN 7.5 E (GLOVE) ×1 IMPLANT
GOWN STRL REUS W/ TWL LRG LVL3 (GOWN DISPOSABLE) ×3 IMPLANT
GOWN STRL REUS W/TWL LRG LVL3 (GOWN DISPOSABLE) ×3
KIT TURNOVER KIT A (KITS) ×2 IMPLANT
LABEL OR SOLS (LABEL) ×2 IMPLANT
NS IRRIG 500ML POUR BTL (IV SOLUTION) ×2 IMPLANT
PACK BASIN MINOR ARMC (MISCELLANEOUS) ×2 IMPLANT
SWAB CULTURE AMIES ANAERIB BLU (MISCELLANEOUS) ×2 IMPLANT

## 2018-04-09 NOTE — Transfer of Care (Signed)
Immediate Anesthesia Transfer of Care Note  Patient: Dawn Ortiz  Procedure(s) Performed: IRRIGATION AND DEBRIDEMENT ABSCESS (Right Breast)  Patient Location: PACU  Anesthesia Type:General  Level of Consciousness: drowsy  Airway & Oxygen Therapy: Patient Spontanous Breathing and Patient connected to face mask oxygen  Post-op Assessment: Report given to RN and Post -op Vital signs reviewed and stable  Post vital signs: Reviewed and stable  Last Vitals:  Vitals Value Taken Time  BP 98/52 04/09/2018  2:44 PM  Temp 37.2 C 04/09/2018  2:44 PM  Pulse 118 04/09/2018  2:47 PM  Resp 15 04/09/2018  2:47 PM  SpO2 100 % 04/09/2018  2:47 PM  Vitals shown include unvalidated device data.  Last Pain:  Vitals:   04/09/18 1444  TempSrc:   PainSc: Asleep         Complications: No apparent anesthesia complications

## 2018-04-09 NOTE — Progress Notes (Signed)
Informed Dr. Karlton Lemon of patients blood sugar of 230, did not want to intervene at this time.

## 2018-04-09 NOTE — Anesthesia Procedure Notes (Signed)
Procedure Name: LMA Insertion Date/Time: 04/09/2018 1:55 PM Performed by: Malva Cogan, CRNA Pre-anesthesia Checklist: Patient identified, Emergency Drugs available, Suction available, Patient being monitored and Timeout performed Patient Re-evaluated:Patient Re-evaluated prior to induction Oxygen Delivery Method: Circle system utilized Preoxygenation: Pre-oxygenation with 100% oxygen Induction Type: IV induction LMA: LMA inserted LMA Size: 3.5 Number of attempts: 1 Placement Confirmation: positive ETCO2 and breath sounds checked- equal and bilateral

## 2018-04-09 NOTE — Interval H&P Note (Signed)
History and Physical Interval Note:  04/09/2018 1:05 PM  Dawn Ortiz  has presented today for surgery, with the diagnosis of RIGHT BREAST ABSCESS  The various methods of treatment have been discussed with the patient and family. After consideration of risks, benefits and other options for treatment, the patient has consented to  Procedure(s): IRRIGATION AND DEBRIDEMENT ABSCESS (Right) as a surgical intervention .  The patient's history has been reviewed, patient examined, no change in status, stable for surgery.  I have reviewed the patient's chart and labs.  Questions were answered to the patient's satisfaction.     Coulter Oldaker

## 2018-04-09 NOTE — Progress Notes (Signed)
04/09/2018  History of Present Illness: Dawn Ortiz is a 26 y.o. female who presents with a right breast abscess.  She has a history of multiple abscess throughout her body in the past and has required I&D procedures in the past.  She has had right breast abscess in the past and has had prior aspiration procedures, but her abscess recurs.  She has had an abscess for the past 3 days.  She went to Urgent Care the first day and was given Keflex.  Then she went to the ED yesterday and was started on Clindamycin.  She had an ultrasound which revealed a 2.7 x 1.8 x 1.7 cm abscess at 5:30 position 7 cm from nipple.  Aspiration was offered but the patient declined as she was hoping we could do an I&D today instead as procedures work better for her than aspiration.  She reports she felt febrile a few days ago and denies any chills, chest pain, shortness of breath.  Reports that with the abscess, she has pain that's spreading from the right breast to the right shoulder and arm.  She also reports an episode of nausea and vomiting and says that this is normal for her when she gets a bad enough abscess.  Past Medical History: Past Medical History:  Diagnosis Date  . Anxiety   . Diabetes mellitus without complication (HCC)   . GERD (gastroesophageal reflux disease)      Past Surgical History: Past Surgical History:  Procedure Laterality Date  . INCISION AND DRAINAGE    . INCISION AND DRAINAGE ABSCESS Left 07/02/2016   Procedure: INCISION AND DRAINAGE ABSCESS;  Surgeon: [name], MD;  Location: [facility] ORS;  Service: General;  Laterality: Left;  . INCISION AND DRAINAGE ABSCESS Left 04/18/2017   Procedure: INCISION AND DRAINAGE ABSCESS-LEFT GROIN;  Surgeon: [name], [name], MD;  Location: [facility] ORS;  Service: General;  Laterality: Left;  . PILONIDAL CYST EXCISION  12/03/2014   Procedure: CYST EXCISION PILONIDAL EXTENSIVE;  Surgeon: [name], MD;  Location: [facility] ORS;  Service: General;;     Home Medications: Prior to Admission medications   Medication Sig Start Date End Date Taking? Authorizing Provider  clindamycin (CLEOCIN) 300 MG capsule Take 1 capsule (300 mg total) by mouth 3 (three) times daily for 10 days. 04/08/18 04/18/18  Woods, Jaclyn M, PA-C  etonogestrel (NEXPLANON) 68 MG IMPL implant 1 each by Subdermal route once.    [provider]  famotidine (PEPCID) 40 MG tablet Take 1 tablet (40 mg total) by mouth daily for 14 days. 02/27/18 03/13/18  Singer, Samantha, PA-C  insulin aspart (NOVOLOG) 100 UNIT/ML injection Inject 25 Units into the skin 3 (three) times daily with meals.    [provider]  insulin detemir (LEVEMIR) 100 UNIT/ML injection Inject 50 Units into the skin 2 (two) times daily.    [provider]  lisinopril (PRINIVIL,ZESTRIL) 2.5 MG tablet Take 2.5 mg by mouth daily.    [provider]  ondansetron (ZOFRAN ODT) 4 MG disintegrating tablet Take 1 tablet (4 mg total) by mouth every 8 (eight) hours as needed for up to 3 days for nausea or vomiting. 04/08/18 04/11/18  Woods, Jaclyn M, PA-C  ondansetron (ZOFRAN) 4 MG tablet Take 1 tablet (4 mg total) by mouth every 8 (eight) hours as needed for nausea or vomiting. 02/27/18   Singer, Samantha, PA-C  oxyCODONE-acetaminophen (PERCOCET/ROXICET) 5-325 MG tablet Take 1 tablet by mouth every 6 (six) hours as needed for up to 3   days for severe pain. 04/08/18 04/11/18  Woods, Jaclyn M, PA-C  PARoxetine (PAXIL) 10 MG tablet TAKE 1 TABLET(10 MG) BY MOUTH AT BEDTIME 05/27/17   Fisher, Susan W, PA-C  sucralfate (CARAFATE) 1 g tablet Take 1 tablet (1 g total) by mouth 4 (four) times daily -  with meals and at bedtime for 7 days. 02/27/18 03/06/18  Singer, Samantha, PA-C    Allergies: Allergies  Allergen Reactions  . Sulfa Antibiotics Hives    Social History:  reports that she has quit smoking. Her smoking use included cigarettes. She has never used smokeless tobacco. She reports that she drinks  about 1.0 - 2.0 standard drinks of alcohol per week. She reports that she does not use drugs.   Family History: Family History  Problem Relation Age of Onset  . Healthy Mother   . Hypertension Father   . Diabetes Father   . Heart disease Father   . Healthy Brother     Review of Systems: Review of Systems  Constitutional: Positive for fever. Negative for chills.  HENT: Negative for hearing loss.   Respiratory: Negative for shortness of breath.   Cardiovascular: Negative for chest pain.  Gastrointestinal: Positive for nausea and vomiting.  Genitourinary: Negative for dysuria.  Musculoskeletal: Positive for myalgias.  Skin: Negative for rash.  Neurological: Negative for dizziness.  Psychiatric/Behavioral: Negative for depression.    Physical Exam BP 122/74   Pulse 74   Temp (!) 97.4 F (36.3 C) (Skin)   Resp 12   Ht 5' 2" (1.575 m)   Wt 182 lb (82.6 kg)   BMI 33.29 kg/m  CONSTITUTIONAL:  In pain distress HEENT:  Normocephalic, atraumatic, extraocular motion intact. NECK: Trachea is midline, and there is no jugular venous distension.  RESPIRATORY:  Lungs are clear, and breath sounds are equal bilaterally. Normal respiratory effort without pathologic use of accessory muscles. CARDIOVASCULAR: Heart is regular without murmurs, gallops, or rubs. BREAST:  Right breast is exquisitely tender to palpation over the inferior portion, to a degree that impedes appropriate exam.  She does have an area of induration and hardness at the inferior portion of the breast, consistent with the findings on ultrasound.  No active drainage.  No lymphadenopathy palpable, but patient was not able to fully lift her arm.  Left breast and axilla negative. GI: The abdomen is soft, obese, nondistended, nontender.  MUSCULOSKELETAL:  Normal muscle strength and tone in all four extremities.  No peripheral edema or cyanosis. SKIN: Skin turgor is normal. There are no pathologic skin lesions.  NEUROLOGIC:  Motor  and sensation is grossly normal.  Cranial nerves are grossly intact. PSYCH:  Alert and oriented to person, place and time. Affect is normal.  Labs/Imaging: Ultrasound 04/08/18: FINDINGS: On physical exam, there is diffuse swelling and marked tenderness along the inferior aspect of the patient's right breast. She is very tender to touch and is having difficulty tolerating within light touch.  Targeted ultrasound is performed, showing an oval, circumscribed cystic mass with internal echoes at the 5:30 position 7 cm from nipple. It measures 2.7 x 1.8 x 1.7 cm. There is peripheral but no internal vascularity.  IMPRESSION: Right breast abscess corresponding with the patient's clinical symptoms.  Assessment and Plan: This is a 26 y.o. female who presents with a right breast abscess.  I have independently viewed the patient's imaging study.  Discussed with the patient that given her significant amount of pain, I am unable to perform any procedures in the office and she   would require an I&D of the right breast abscess under anesthesia.  She is in full agreement with this and would much rather be asleep for the procedure.  Described in detail the procedure including risks of bleeding, infection, injury to surrounding structures, need for further procedures, and she's willing to proceed.  We have availability in the OR today at 1 pm and she'll be heading to the hospital directly from here.  She has been NPO since last night.  Face-to-face time spent with the patient and care providers was 40 minutes, with more than 50% of the time spent counseling, educating, and coordinating care of the patient.     Dawn Ortiz Luis Elmer Merwin, MD [facility]    

## 2018-04-09 NOTE — Anesthesia Postprocedure Evaluation (Signed)
Anesthesia Post Note  Patient: Dawn Ortiz  Procedure(s) Performed: IRRIGATION AND DEBRIDEMENT ABSCESS (Right Breast)  Patient location during evaluation: PACU Anesthesia Type: General Level of consciousness: awake and alert Pain management: pain level controlled Vital Signs Assessment: post-procedure vital signs reviewed and stable Respiratory status: spontaneous breathing, nonlabored ventilation, respiratory function stable and patient connected to nasal cannula oxygen Cardiovascular status: blood pressure returned to baseline and stable Postop Assessment: no apparent nausea or vomiting Anesthetic complications: no     Last Vitals:  Vitals:   04/09/18 1529 04/09/18 1537  BP: 107/68 119/76  Pulse: 100 (!) 102  Resp: 17 16  Temp:  36.4 C  SpO2: 100% 95%    Last Pain:  Vitals:   04/09/18 1537  TempSrc: Temporal  PainSc: 0-No pain                 Lenard Simmer

## 2018-04-09 NOTE — Anesthesia Preprocedure Evaluation (Signed)
Anesthesia Evaluation  Patient identified by MRN, date of birth, ID band Patient awake    Reviewed: Allergy & Precautions, H&P , NPO status , Patient's Chart, lab work & pertinent test results  History of Anesthesia Complications Negative for: history of anesthetic complications  Airway Mallampati: III  TM Distance: >3 FB Neck ROM: full    Dental  (+) Poor Dentition, Chipped   Pulmonary neg shortness of breath, former smoker,           Cardiovascular Exercise Tolerance: Good (-) angina(-) Past MI and (-) DOE negative cardio ROS       Neuro/Psych PSYCHIATRIC DISORDERS Anxiety negative neurological ROS     GI/Hepatic Neg liver ROS, GERD  Medicated and Controlled,  Endo/Other  diabetes, Type 2  Renal/GU      Musculoskeletal   Abdominal   Peds  Hematology negative hematology ROS (+)   Anesthesia Other Findings Past Medical History: No date: Anxiety No date: Diabetes mellitus without complication (HCC) No date: GERD (gastroesophageal reflux disease)  Past Surgical History: No date: INCISION AND DRAINAGE 07/02/2016: INCISION AND DRAINAGE ABSCESS; Left     Comment:  Procedure: INCISION AND DRAINAGE ABSCESS;  Surgeon:               Lattie Haw, MD;  Location: [facility] ORS;  Service:               General;  Laterality: Left; 12/03/2014: PILONIDAL CYST EXCISION     Comment:  Procedure: CYST EXCISION PILONIDAL EXTENSIVE;  Surgeon:               Duwaine Maxin, MD;  Location: [facility] ORS;  Service:               General;;  BMI    Body Mass Index:  33.84 kg/m      Reproductive/Obstetrics negative OB ROS                             Anesthesia Physical  Anesthesia Plan  ASA: III  Anesthesia Plan: General   Post-op Pain Management:    Induction: Intravenous  PONV Risk Score and Plan: 3 and Ondansetron, Dexamethasone, Midazolam, Treatment may vary due to age or medical condition and  Promethazine  Airway Management Planned: LMA  Additional Equipment:   Intra-op Plan:   Post-operative Plan: Extubation in OR  Informed Consent: I have reviewed the patients History and Physical, chart, labs and discussed the procedure including the risks, benefits and alternatives for the proposed anesthesia with the patient or authorized representative who has indicated his/her understanding and acceptance.   Dental Advisory Given  Plan Discussed with: Anesthesiologist, CRNA and Surgeon  Anesthesia Plan Comments: (Patient consented for risks of anesthesia including but not limited to:  - adverse reactions to medications - damage to teeth, lips or other oral mucosa - sore throat or hoarseness - Damage to heart, brain, lungs or loss of life  Patient voiced understanding.)        Anesthesia Quick Evaluation

## 2018-04-09 NOTE — H&P (View-Only) (Signed)
04/09/2018  History of Present Illness: Dawn Ortiz is a 27 y.o. female who presents with a right breast abscess.  She has a history of multiple abscess throughout her body in the past and has required I&D procedures in the past.  She has had right breast abscess in the past and has had prior aspiration procedures, but her abscess recurs.  She has had an abscess for the past 3 days.  She went to Urgent Care the first day and was given Keflex.  Then she went to the ED yesterday and was started on Clindamycin.  She had an ultrasound which revealed a 2.7 x 1.8 x 1.7 cm abscess at 5:30 position 7 cm from nipple.  Aspiration was offered but the patient declined as she was hoping we could do an I&D today instead as procedures work better for her than aspiration.  She reports she felt febrile a few days ago and denies any chills, chest pain, shortness of breath.  Reports that with the abscess, she has pain that's spreading from the right breast to the right shoulder and arm.  She also reports an episode of nausea and vomiting and says that this is normal for her when she gets a bad enough abscess.  Past Medical History: Past Medical History:  Diagnosis Date  . Anxiety   . Diabetes mellitus without complication (HCC)   . GERD (gastroesophageal reflux disease)      Past Surgical History: Past Surgical History:  Procedure Laterality Date  . INCISION AND DRAINAGE    . INCISION AND DRAINAGE ABSCESS Left 07/02/2016   Procedure: INCISION AND DRAINAGE ABSCESS;  Surgeon: Lattie Haw, MD;  Location: [facility] ORS;  Service: General;  Laterality: Left;  . INCISION AND DRAINAGE ABSCESS Left 04/18/2017   Procedure: INCISION AND DRAINAGE ABSCESS-LEFT GROIN;  Surgeon: Ancil Linsey, MD;  Location: [facility] OR[facility];  Service: General;  Laterality: Left;  . PILONIDAL CYST EXCISION  12/03/2014   Procedure: CYST EXCISION PILONIDAL EXTENSIVE;  Surgeon: Duwaine Maxin, MD;  Location: [facility] ORS;  Service: General;;     Home Medications: Prior to Admission medications   Medication Sig Start Date End Date Taking? Authorizing Provider  clindamycin (CLEOCIN) 300 MG capsule Take 1 capsule (300 mg total) by mouth 3 (three) times daily for 10 days. 04/08/18 04/18/18  Orvil Feil, PA-C  etonogestrel (NEXPLANON) 68 MG IMPL implant 1 each by Subdermal route once.    [provider]  famotidine (PEPCID) 40 MG tablet Take 1 tablet (40 mg total) by mouth daily for 14 days. 02/27/18 03/13/18  Janalyn Harder, PA-C  insulin aspart (NOVOLOG) 100 UNIT/ML injection Inject 25 Units into the skin 3 (three) times daily with meals.    [provider]  insulin detemir (LEVEMIR) 100 UNIT/ML injection Inject 50 Units into the skin 2 (two) times daily.    [provider]  lisinopril (PRINIVIL,ZESTRIL) 2.5 MG tablet Take 2.5 mg by mouth daily.    [provider]  ondansetron (ZOFRAN ODT) 4 MG disintegrating tablet Take 1 tablet (4 mg total) by mouth every 8 (eight) hours as needed for up to 3 days for nausea or vomiting. 04/08/18 04/11/18  Orvil Feil, PA-C  ondansetron (ZOFRAN) 4 MG tablet Take 1 tablet (4 mg total) by mouth every 8 (eight) hours as needed for nausea or vomiting. 02/27/18   Janalyn Harder, PA-C  oxyCODONE-acetaminophen (PERCOCET/ROXICET) 5-325 MG tablet Take 1 tablet by mouth every 6 (six) hours as needed for up to 3  days for severe pain. 04/08/18 04/11/18  Pia Mau M, PA-C  PARoxetine (PAXIL) 10 MG tablet TAKE 1 TABLET(10 MG) BY MOUTH AT BEDTIME 05/27/17   Fisher, Roselyn Bering, PA-C  sucralfate (CARAFATE) 1 g tablet Take 1 tablet (1 g total) by mouth 4 (four) times daily -  with meals and at bedtime for 7 days. 02/27/18 03/06/18  Janalyn Harder, PA-C    Allergies: Allergies  Allergen Reactions  . Sulfa Antibiotics Hives    Social History:  reports that she has quit smoking. Her smoking use included cigarettes. She has never used smokeless tobacco. She reports that she drinks  about 1.0 - 2.0 standard drinks of alcohol per week. She reports that she does not use drugs.   Family History: Family History  Problem Relation Age of Onset  . Healthy Mother   . Hypertension Father   . Diabetes Father   . Heart disease Father   . Healthy Brother     Review of Systems: Review of Systems  Constitutional: Positive for fever. Negative for chills.  HENT: Negative for hearing loss.   Respiratory: Negative for shortness of breath.   Cardiovascular: Negative for chest pain.  Gastrointestinal: Positive for nausea and vomiting.  Genitourinary: Negative for dysuria.  Musculoskeletal: Positive for myalgias.  Skin: Negative for rash.  Neurological: Negative for dizziness.  Psychiatric/Behavioral: Negative for depression.    Physical Exam BP 122/74   Pulse 74   Temp (!) 97.4 F (36.3 C) (Skin)   Resp 12   Ht 5\' 2"  (1.575 m)   Wt 182 lb (82.6 kg)   BMI 33.29 kg/m  CONSTITUTIONAL:  In pain distress HEENT:  Normocephalic, atraumatic, extraocular motion intact. NECK: Trachea is midline, and there is no jugular venous distension.  RESPIRATORY:  Lungs are clear, and breath sounds are equal bilaterally. Normal respiratory effort without pathologic use of accessory muscles. CARDIOVASCULAR: Heart is regular without murmurs, gallops, or rubs. BREAST:  Right breast is exquisitely tender to palpation over the inferior portion, to a degree that impedes appropriate exam.  She does have an area of induration and hardness at the inferior portion of the breast, consistent with the findings on ultrasound.  No active drainage.  No lymphadenopathy palpable, but patient was not able to fully lift her arm.  Left breast and axilla negative. GI: The abdomen is soft, obese, nondistended, nontender.  MUSCULOSKELETAL:  Normal muscle strength and tone in all four extremities.  No peripheral edema or cyanosis. SKIN: Skin turgor is normal. There are no pathologic skin lesions.  NEUROLOGIC:  Motor  and sensation is grossly normal.  Cranial nerves are grossly intact. PSYCH:  Alert and oriented to person, place and time. Affect is normal.  Labs/Imaging: Ultrasound 04/08/18: FINDINGS: On physical exam, there is diffuse swelling and marked tenderness along the inferior aspect of the patient's right breast. She is very tender to touch and is having difficulty tolerating within light touch.  Targeted ultrasound is performed, showing an oval, circumscribed cystic mass with internal echoes at the 5:30 position 7 cm from nipple. It measures 2.7 x 1.8 x 1.7 cm. There is peripheral but no internal vascularity.  IMPRESSION: Right breast abscess corresponding with the patient's clinical symptoms.  Assessment and Plan: This is a 27 y.o. female who presents with a right breast abscess.  I have independently viewed the patient's imaging study.  Discussed with the patient that given her significant amount of pain, I am unable to perform any procedures in the office and she  would require an I&D of the right breast abscess under anesthesia.  She is in full agreement with this and would much rather be asleep for the procedure.  Described in detail the procedure including risks of bleeding, infection, injury to surrounding structures, need for further procedures, and she's willing to proceed.  We have availability in the OR today at 1 pm and she'll be heading to the hospital directly from here.  She has been NPO since last night.  Face-to-face time spent with the patient and care providers was 40 minutes, with more than 50% of the time spent counseling, educating, and coordinating care of the patient.     Howie Ill, MD  [facility]

## 2018-04-09 NOTE — Anesthesia Post-op Follow-up Note (Signed)
Anesthesia QCDR form completed.        

## 2018-04-09 NOTE — Op Note (Signed)
  Procedure Date:  04/09/2018  Pre-operative Diagnosis:  Right breast abscess  Post-operative Diagnosis:  Right breast abscess  Procedure:  Incision and Drainage of right breast abscess  Surgeon:  Howie Ill, MD  Anesthesia:  General endotracheal  Estimated Blood Loss:  5 ml  Specimens:  Purulent fluid swabbed for culture  Complications:  None  Indications for Procedure:  This is a 27 y.o. female with diagnosis of right breast abscess, requiring drainage procedure.  The risks of bleeding, abscess or infection, injury to surrounding structures, and need for further procedures were all discussed with the patient and was willing to proceed.  Description of Procedure: The patient was correctly identified in the preoperative area and brought into the operating room.  The patient was placed supine with VTE prophylaxis in place.  Appropriate time-outs were performed.  Anesthesia was induced and the patient was intubated.  Appropriate antibiotics were infused.  The patient's right breast was prepped and draped in usual sterile fashion.  The area of induration was at the inferior aspect of the breast, near the inframammary fold, at the 5 o clock position.  A 3 cm incision was made over the abscess, revealing purulent fluid.  This fluid was swabbed for culture and sent to micro.  Small Kelly forceps were used to dissect around the abscess tissue to open any remaining pockets of purulent fluid.  After drainage was completed, the cavity was irrigated and cleaned.  Hemostasis was achieved with cautery. The wound was packed with 1/2 inch iodoform gauze and covered with dry gauze and tape.  The patient tolerated the procedure well and all counts were correct at the end of the case.   Howie Ill, MD

## 2018-04-14 LAB — AEROBIC/ANAEROBIC CULTURE (SURGICAL/DEEP WOUND): CULTURE: NORMAL

## 2018-04-14 LAB — AEROBIC/ANAEROBIC CULTURE W GRAM STAIN (SURGICAL/DEEP WOUND)

## 2018-04-16 ENCOUNTER — Encounter: Payer: Managed Care, Other (non HMO) | Admitting: Surgery

## 2018-05-16 ENCOUNTER — Encounter: Payer: Self-pay | Admitting: Surgery

## 2018-05-16 ENCOUNTER — Ambulatory Visit (INDEPENDENT_AMBULATORY_CARE_PROVIDER_SITE_OTHER): Payer: Managed Care, Other (non HMO) | Admitting: Surgery

## 2018-05-16 ENCOUNTER — Other Ambulatory Visit: Payer: Self-pay

## 2018-05-16 VITALS — BP 143/100 | HR 111 | Temp 98.1°F | Resp 16 | Ht 62.0 in | Wt 188.8 lb

## 2018-05-16 DIAGNOSIS — Z09 Encounter for follow-up examination after completed treatment for conditions other than malignant neoplasm: Secondary | ICD-10-CM

## 2018-05-16 DIAGNOSIS — N611 Abscess of the breast and nipple: Secondary | ICD-10-CM

## 2018-05-16 MED ORDER — FLUCONAZOLE 100 MG PO TABS: 100.0000 mg | ORAL_TABLET | Freq: Every day | ORAL | 0 refills | Status: AC

## 2018-05-16 MED ORDER — CLINDAMYCIN HCL 300 MG PO CAPS: 300.0000 mg | ORAL_CAPSULE | Freq: Three times a day (TID) | ORAL | 0 refills | Status: DC

## 2018-05-16 NOTE — Progress Notes (Signed)
05/16/2018  HPI: Dawn Ortiz is a 27 y.o. female s/p right breast I&D for a right breast abscess on 9/11.  She did not present to her follow up appointment but she reports that her right breast wound healed completely and has not had any issues with it.  However, over the past two days, she has noticed that the left breast is starting to be tender like the right breast prior to I&D.  It is not as severe yet.  She denies any fevers at home and denies any drainage from the left breast.  Vital signs: BP (!) 143/100   Pulse (!) 111   Temp 98.1 F (36.7 C) (Temporal)   Resp 16   Ht 5\' 2"  (1.575 m)   Wt 188 lb 12.8 oz (85.6 kg)   SpO2 99%   BMI 34.53 kg/m    Physical Exam: Constitutional: No acute distress Breast:  Right breast I&D site well healed without any erythema, induration, or evidence of infection.  The left breast, inferiorly, has an area of firmness but no fluctuance that is palpable, and is tender to palpation.  There is no significant erythema or induration.  Assessment/Plan: This is a 27 y.o. female s/p I&D of right breast abscess with what appears to be a new left breast abscess that is starting.  Her exam is better on the left side compared to the right side prior to I&D. There is also no significant fluctuance or erythema.  We'll attempt conservative treatment for now with oral antibiotics.  She had clindamycin for her last abscess.  She will follow up next week and if there is no improvement, we'll proceed with another I&D in the OR.  She understands this plan and is in agreement.   Howie Ill, MD [facility]

## 2018-05-16 NOTE — Patient Instructions (Addendum)
Patient is to return on 05/21/18 please schedule with Dr. Aleen Campi. Please start and complete all medication that has been prescribed.  Skin Abscess A skin abscess is an infected area on or under your skin that contains pus and other material. An abscess can happen almost anywhere on your body. Some abscesses break open (rupture) on their own. Most continue to get worse unless they are treated. The infection can spread deeper into the body and into your blood, which can make you feel sick. Treatment usually involves draining the abscess. Follow these instructions at home: Abscess Care  If you have an abscess that has not drained, place a warm, clean, wet washcloth over the abscess several times a day. Do this as told by your doctor.  Follow instructions from your doctor about how to take care of your abscess. Make sure you: ? Cover the abscess with a bandage (dressing). ? Change your bandage or gauze as told by your doctor. ? Wash your hands with soap and water before you change the bandage or gauze. If you cannot use soap and water, use hand sanitizer.  Check your abscess every day for signs that the infection is getting worse. Check for: ? More redness, swelling, or pain. ? More fluid or blood. ? Warmth. ? More pus or a bad smell. Medicines   Take over-the-counter and prescription medicines only as told by your doctor.  If you were prescribed an antibiotic medicine, take it as told by your doctor. Do not stop taking the antibiotic even if you start to feel better. General instructions  To avoid spreading the infection: ? Do not share personal care items, towels, or hot tubs with others. ? Avoid making skin-to-skin contact with other people.  Keep all follow-up visits as told by your doctor. This is important. Contact a doctor if:  You have more redness, swelling, or pain around your abscess.  You have more fluid or blood coming from your abscess.  Your abscess feels warm when  you touch it.  You have more pus or a bad smell coming from your abscess.  You have a fever.  Your muscles ache.  You have chills.  You feel sick. Get help right away if:  You have very bad (severe) pain.  You see red streaks on your skin spreading away from the abscess. This information is not intended to replace advice given to you by your health care provider. Make sure you discuss any questions you have with your health care provider. Document Released: 01/02/2008 Document Revised: 03/11/2016 Document Reviewed: 05/25/2015 Elsevier Interactive Patient Education  [employer].  by the doctor for any other questions or concerns call the office.

## 2018-05-21 ENCOUNTER — Ambulatory Visit
Admit: 2018-05-21 | Discharge: 2018-05-21 | Disposition: A | Discharge status: Home / Self Care | Payer: Managed Care, Other (non HMO) | Source: Ambulatory Visit | Attending: Surgery | Admitting: Surgery

## 2018-05-21 ENCOUNTER — Ambulatory Visit (INDEPENDENT_AMBULATORY_CARE_PROVIDER_SITE_OTHER): Payer: Managed Care, Other (non HMO) | Admitting: Surgery

## 2018-05-21 ENCOUNTER — Ambulatory Visit: Payer: Managed Care, Other (non HMO) | Admitting: Anesthesiology

## 2018-05-21 ENCOUNTER — Encounter: Payer: Self-pay | Admitting: Surgery

## 2018-05-21 ENCOUNTER — Encounter: Disposition: A | Discharge status: Home / Self Care | Payer: Self-pay | Source: Ambulatory Visit | Attending: Surgery

## 2018-05-21 ENCOUNTER — Encounter: Payer: Self-pay | Admitting: *Deleted

## 2018-05-21 ENCOUNTER — Other Ambulatory Visit: Payer: Self-pay

## 2018-05-21 VITALS — BP 151/91 | HR 96 | Temp 97.5°F | Resp 13 | Ht 62.0 in | Wt 182.8 lb

## 2018-05-21 DIAGNOSIS — L02415 Cutaneous abscess of right lower limb: Secondary | ICD-10-CM | POA: Diagnosis present

## 2018-05-21 DIAGNOSIS — Z79899 Other long term (current) drug therapy: Secondary | ICD-10-CM | POA: Diagnosis not present

## 2018-05-21 DIAGNOSIS — Z09 Encounter for follow-up examination after completed treatment for conditions other than malignant neoplasm: Secondary | ICD-10-CM

## 2018-05-21 DIAGNOSIS — Z87891 Personal history of nicotine dependence: Secondary | ICD-10-CM | POA: Diagnosis not present

## 2018-05-21 DIAGNOSIS — N611 Abscess of the breast and nipple: Secondary | ICD-10-CM | POA: Diagnosis not present

## 2018-05-21 DIAGNOSIS — F419 Anxiety disorder, unspecified: Secondary | ICD-10-CM | POA: Insufficient documentation

## 2018-05-21 DIAGNOSIS — K219 Gastro-esophageal reflux disease without esophagitis: Secondary | ICD-10-CM | POA: Insufficient documentation

## 2018-05-21 DIAGNOSIS — E119 Type 2 diabetes mellitus without complications: Secondary | ICD-10-CM | POA: Diagnosis not present

## 2018-05-21 HISTORY — PX: INCISION AND DRAINAGE ABSCESS: SHX5864

## 2018-05-21 HISTORY — PX: IRRIGATION AND DEBRIDEMENT ABSCESS: SHX5252

## 2018-05-21 LAB — POCT PREGNANCY, URINE: PREG TEST UR: NEGATIVE

## 2018-05-21 LAB — GLUCOSE, CAPILLARY
GLUCOSE-CAPILLARY: 280 mg/dL — AB (ref 70–99)
Glucose-Capillary: 254 mg/dL — ABNORMAL HIGH (ref 70–99)

## 2018-05-21 SURGERY — IRRIGATION AND DEBRIDEMENT ABSCESS
Anesthesia: General | Site: Thigh | Laterality: Right

## 2018-05-21 MED ORDER — CHLORHEXIDINE GLUCONATE CLOTH 2 % EX PADS: 6.0000 | MEDICATED_PAD | Freq: Once | CUTANEOUS | Status: DC

## 2018-05-21 MED ORDER — MEPERIDINE HCL 50 MG/ML IJ SOLN: 6.2500 mg | INTRAMUSCULAR | Status: DC | PRN

## 2018-05-21 MED ORDER — ACETAMINOPHEN 500 MG PO TABS
1000.0000 mg | ORAL_TABLET | ORAL | Status: AC
  Administered 2018-05-21: 1000 mg via ORAL

## 2018-05-21 MED ORDER — PROPOFOL 10 MG/ML IV BOLUS
INTRAVENOUS | Status: AC
  Filled 2018-05-21: qty 20

## 2018-05-21 MED ORDER — PROMETHAZINE HCL 25 MG/ML IJ SOLN: 6.2500 mg | INTRAMUSCULAR | Status: DC | PRN

## 2018-05-21 MED ORDER — FAMOTIDINE 20 MG PO TABS
20.0000 mg | ORAL_TABLET | Freq: Once | ORAL | Status: AC
  Administered 2018-05-21: 20 mg via ORAL

## 2018-05-21 MED ORDER — CEFAZOLIN SODIUM-DEXTROSE 2-4 GM/100ML-% IV SOLN
2.0000 g | INTRAVENOUS | Status: AC
  Administered 2018-05-21: 2 g via INTRAVENOUS

## 2018-05-21 MED ORDER — ONDANSETRON HCL 4 MG/2ML IJ SOLN
INTRAMUSCULAR | Status: AC
  Filled 2018-05-21: qty 2

## 2018-05-21 MED ORDER — IBUPROFEN 600 MG PO TABS: 600.0000 mg | ORAL_TABLET | Freq: Three times a day (TID) | ORAL | 0 refills | Status: DC | PRN

## 2018-05-21 MED ORDER — DEXAMETHASONE SODIUM PHOSPHATE 10 MG/ML IJ SOLN
INTRAMUSCULAR | Status: AC
  Filled 2018-05-21: qty 1

## 2018-05-21 MED ORDER — ONDANSETRON HCL 4 MG/2ML IJ SOLN
INTRAMUSCULAR | Status: DC | PRN
  Administered 2018-05-21: 4 mg via INTRAVENOUS

## 2018-05-21 MED ORDER — ACETAMINOPHEN 500 MG PO TABS
ORAL_TABLET | ORAL | Status: AC
  Filled 2018-05-21: qty 2

## 2018-05-21 MED ORDER — FAMOTIDINE 20 MG PO TABS
ORAL_TABLET | ORAL | Status: AC
  Filled 2018-05-21: qty 1

## 2018-05-21 MED ORDER — FENTANYL CITRATE (PF) 100 MCG/2ML IJ SOLN
INTRAMUSCULAR | Status: AC
  Administered 2018-05-21: 25 ug via INTRAVENOUS
  Filled 2018-05-21: qty 2

## 2018-05-21 MED ORDER — PROPOFOL 10 MG/ML IV BOLUS
INTRAVENOUS | Status: DC | PRN
  Administered 2018-05-21: 200 mg via INTRAVENOUS

## 2018-05-21 MED ORDER — KETOROLAC TROMETHAMINE 30 MG/ML IJ SOLN
INTRAMUSCULAR | Status: AC
  Filled 2018-05-21: qty 1

## 2018-05-21 MED ORDER — FENTANYL CITRATE (PF) 100 MCG/2ML IJ SOLN
INTRAMUSCULAR | Status: AC
  Filled 2018-05-21: qty 2

## 2018-05-21 MED ORDER — GABAPENTIN 300 MG PO CAPS
300.0000 mg | ORAL_CAPSULE | ORAL | Status: AC
  Administered 2018-05-21: 300 mg via ORAL

## 2018-05-21 MED ORDER — SODIUM CHLORIDE 0.9 % IV SOLN
INTRAVENOUS | Status: DC
  Administered 2018-05-21: 14:00:00 via INTRAVENOUS

## 2018-05-21 MED ORDER — HYDROCODONE-ACETAMINOPHEN 7.5-325 MG PO TABS: 1.0000 | ORAL_TABLET | Freq: Once | ORAL | Status: DC | PRN

## 2018-05-21 MED ORDER — FENTANYL CITRATE (PF) 100 MCG/2ML IJ SOLN
25.0000 ug | INTRAMUSCULAR | Status: DC | PRN
  Administered 2018-05-21 (×4): 25 ug via INTRAVENOUS

## 2018-05-21 MED ORDER — ACETAMINOPHEN 325 MG PO TABS: 325.0000 mg | ORAL_TABLET | ORAL | Status: DC | PRN

## 2018-05-21 MED ORDER — LIDOCAINE HCL (CARDIAC) PF 100 MG/5ML IV SOSY
PREFILLED_SYRINGE | INTRAVENOUS | Status: DC | PRN
  Administered 2018-05-21: 100 mg via INTRAVENOUS

## 2018-05-21 MED ORDER — DEXAMETHASONE SODIUM PHOSPHATE 10 MG/ML IJ SOLN
INTRAMUSCULAR | Status: DC | PRN
  Administered 2018-05-21: 10 mg via INTRAVENOUS

## 2018-05-21 MED ORDER — BUPIVACAINE-EPINEPHRINE (PF) 0.5% -1:200000 IJ SOLN
INTRAMUSCULAR | Status: AC
  Filled 2018-05-21: qty 30

## 2018-05-21 MED ORDER — CEFAZOLIN SODIUM-DEXTROSE 2-4 GM/100ML-% IV SOLN
INTRAVENOUS | Status: AC
  Filled 2018-05-21: qty 100

## 2018-05-21 MED ORDER — MIDAZOLAM HCL 2 MG/2ML IJ SOLN
INTRAMUSCULAR | Status: DC | PRN
  Administered 2018-05-21: 2 mg via INTRAVENOUS

## 2018-05-21 MED ORDER — FENTANYL CITRATE (PF) 100 MCG/2ML IJ SOLN
INTRAMUSCULAR | Status: DC | PRN
  Administered 2018-05-21 (×3): 50 ug via INTRAVENOUS

## 2018-05-21 MED ORDER — BUPIVACAINE-EPINEPHRINE 0.5% -1:200000 IJ SOLN
INTRAMUSCULAR | Status: DC | PRN
  Administered 2018-05-21: 20 mL

## 2018-05-21 MED ORDER — KETOROLAC TROMETHAMINE 30 MG/ML IJ SOLN
INTRAMUSCULAR | Status: DC | PRN
  Administered 2018-05-21: 30 mg via INTRAVENOUS

## 2018-05-21 MED ORDER — GABAPENTIN 300 MG PO CAPS
ORAL_CAPSULE | ORAL | Status: AC
  Filled 2018-05-21: qty 1

## 2018-05-21 MED ORDER — ACETAMINOPHEN 160 MG/5ML PO SOLN: 325.0000 mg | ORAL | Status: DC | PRN

## 2018-05-21 MED ORDER — GLYCOPYRROLATE 0.2 MG/ML IJ SOLN
INTRAMUSCULAR | Status: DC | PRN
  Administered 2018-05-21: 0.2 mg via INTRAVENOUS

## 2018-05-21 MED ORDER — AMOXICILLIN-POT CLAVULANATE 875-125 MG PO TABS: 1.0000 | ORAL_TABLET | Freq: Two times a day (BID) | ORAL | 0 refills | Status: AC

## 2018-05-21 MED ORDER — GLYCOPYRROLATE 0.2 MG/ML IJ SOLN
INTRAMUSCULAR | Status: AC
  Filled 2018-05-21: qty 1

## 2018-05-21 MED ORDER — ACETAMINOPHEN 10 MG/ML IV SOLN
INTRAVENOUS | Status: AC
  Filled 2018-05-21: qty 100

## 2018-05-21 MED ORDER — HYDROCODONE-ACETAMINOPHEN 5-325 MG PO TABS: 1.0000 | ORAL_TABLET | ORAL | 0 refills | Status: DC | PRN

## 2018-05-21 MED ORDER — LIDOCAINE HCL (PF) 2 % IJ SOLN
INTRAMUSCULAR | Status: AC
  Filled 2018-05-21: qty 10

## 2018-05-21 MED ORDER — MIDAZOLAM HCL 2 MG/2ML IJ SOLN
INTRAMUSCULAR | Status: AC
  Filled 2018-05-21: qty 2

## 2018-05-21 SURGICAL SUPPLY — 22 items
BLADE SURG 15 STRL LF DISP TIS (BLADE) ×2 IMPLANT
BLADE SURG 15 STRL SS (BLADE) ×1
BNDG GAUZE 4.5X4.1 6PLY STRL (MISCELLANEOUS) ×3 IMPLANT
CHLORAPREP W/TINT 26ML (MISCELLANEOUS) ×3 IMPLANT
COVER WAND RF STERILE (DRAPES) ×3 IMPLANT
DRAIN PENROSE 1/4X12 LTX (DRAIN) ×3 IMPLANT
ELECT REM PT RETURN 9FT ADLT (ELECTROSURGICAL) ×3
ELECTRODE REM PT RTRN 9FT ADLT (ELECTROSURGICAL) ×2 IMPLANT
GAUZE SPONGE 4X4 12PLY STRL (GAUZE/BANDAGES/DRESSINGS) ×3 IMPLANT
GLOVE SURG SYN 7.0 (GLOVE) ×3 IMPLANT
GLOVE SURG SYN 7.5  E (GLOVE) ×1
GLOVE SURG SYN 7.5 E (GLOVE) ×2 IMPLANT
GOWN STRL REUS W/ TWL LRG LVL3 (GOWN DISPOSABLE) ×4 IMPLANT
GOWN STRL REUS W/TWL LRG LVL3 (GOWN DISPOSABLE) ×2
KIT TURNOVER KIT A (KITS) ×3 IMPLANT
LABEL OR SOLS (LABEL) ×3 IMPLANT
NEEDLE HYPO 22GX1.5 SAFETY (NEEDLE) ×3 IMPLANT
NS IRRIG 500ML POUR BTL (IV SOLUTION) ×3 IMPLANT
PACK BASIN MINOR ARMC (MISCELLANEOUS) ×3 IMPLANT
SWAB CULTURE AMIES ANAERIB BLU (MISCELLANEOUS) ×3 IMPLANT
SYR 10ML LL (SYRINGE) ×3 IMPLANT
SYR BULB IRRIG 60ML STRL (SYRINGE) ×3 IMPLANT

## 2018-05-21 NOTE — Interval H&P Note (Signed)
History and Physical Interval Note:  05/21/2018 12:50 PM  Dawn Ortiz  has presented today for surgery, with the diagnosis of LEFT BREAST ABSCESS,RIGHT THIGH ABSCESS  The various methods of treatment have been discussed with the patient and family. After consideration of risks, benefits and other options for treatment, the patient has consented to  Procedure(s): IRRIGATION AND DEBRIDEMENT BREAST ABSCESS (Left) INCISION AND DRAINAGE ABSCESS- RIGHT THIGH (Right) as a surgical intervention .  The patient's history has been reviewed, patient examined, no change in status, stable for surgery.  I have reviewed the patient's chart and labs.  Questions were answered to the patient's satisfaction.     Debie Ashline

## 2018-05-21 NOTE — Patient Instructions (Addendum)
Patient will need to be at the hospital today by 11:00 am to have procedure done today at 1:30 pm with Dr.Piscoya continue to fast until the procedure has been completed. Return to the office in 1 week.   Skin Abscess A skin abscess is an infected area on or under your skin that contains pus and other material. An abscess can happen almost anywhere on your body. Some abscesses break open (rupture) on their own. Most continue to get worse unless they are treated. The infection can spread deeper into the body and into your blood, which can make you feel sick. Treatment usually involves draining the abscess. Follow these instructions at home: Abscess Care  If you have an abscess that has not drained, place a warm, clean, wet washcloth over the abscess several times a day. Do this as told by your doctor.  Follow instructions from your doctor about how to take care of your abscess. Make sure you: ? Cover the abscess with a bandage (dressing). ? Change your bandage or gauze as told by your doctor. ? Wash your hands with soap and water before you change the bandage or gauze. If you cannot use soap and water, use hand sanitizer.  Check your abscess every day for signs that the infection is getting worse. Check for: ? More redness, swelling, or pain. ? More fluid or blood. ? Warmth. ? More pus or a bad smell. Medicines   Take over-the-counter and prescription medicines only as told by your doctor.  If you were prescribed an antibiotic medicine, take it as told by your doctor. Do not stop taking the antibiotic even if you start to feel better. General instructions  To avoid spreading the infection: ? Do not share personal care items, towels, or hot tubs with others. ? Avoid making skin-to-skin contact with other people.  Keep all follow-up visits as told by your doctor. This is important. Contact a doctor if:  You have more redness, swelling, or pain around your abscess.  You have more  fluid or blood coming from your abscess.  Your abscess feels warm when you touch it.  You have more pus or a bad smell coming from your abscess.  You have a fever.  Your muscles ache.  You have chills.  You feel sick. Get help right away if:  You have very bad (severe) pain.  You see red streaks on your skin spreading away from the abscess. This information is not intended to replace advice given to you by your health care provider. Make sure you discuss any questions you have with your health care provider. Document Released: 01/02/2008 Document Revised: 03/11/2016 Document Reviewed: 05/25/2015 Elsevier Interactive Patient Education  [employer].

## 2018-05-21 NOTE — Op Note (Signed)
  Procedure Date:  05/21/2018  Pre-operative Diagnosis:  Left breast abscess and right thigh abscess  Post-operative Diagnosis:  Left breast abscess and right thigh abscess  Procedure:  Incision and Drainage of left breast abscess and right thigh abscess  Surgeon:  Howie Ill, MD  Anesthesia:  General endotracheal  Estimated Blood Loss:  10 ml  Specimens:  Culture swab of left breast abscess and culture swab of right thigh abscess  Complications:  None  Indications for Procedure:  This is a 27 y.o. female with diagnosis of left breast and right thigh abscesses, requiring drainage procedure.  The risks of bleeding, abscess or infection, injury to surrounding structures, and need for further procedures were all discussed with the patient and was willing to proceed.  Description of Procedure: The patient was correctly identified in the preoperative area and brought into the operating room.  The patient was placed supine with VTE prophylaxis in place.  Appropriate time-outs were performed.  Anesthesia was induced and the patient was intubated.  Appropriate antibiotics were infused.  The patient's left breast and right thigh were prepped and draped in usual sterile fashion.    The procedure started with the left breast.  A 3 cm incision was made over the abscess, located at the inferior portion of the left breast, revealing purulent fluid.  This fluid was swabbed for culture and sent to micro.  Small Kelly forceps were used to dissect around the abscess tissue to open any remaining pockets of purulent fluid.  The abscess cavity tracked mostly medially.  After drainage was completed, the cavity was irrigated and cleaned.  Local anesthetic was infused intradermally. The wound was packed with 1/2 inch iodoform gauze and covered with dry gauze and tape.  Attention then was turned to the right thigh.  The area of firmness in the upper inner thigh was identified and a 20 gauge needle was used to  aspirate subcutaneously, revealing purulent fluid as well.  A 3 cm incision was made over the abscess.  This fluid was swabbed for culture and sent to micro.  Small Kelly forceps were used to dissect around the abscess tissue to open any remaining pockets of purulent fluid.  The abscess cavity tracked mostly superiorly.  After drainage was completed, the cavity was irrigated and cleaned.  Local anesthetic was infused intradermally. The wound was packed with 1/2 inch iodoform gauze and covered with dry gauze and tape.  The patient was then emerged from anesthesia, extubated, and brought to the recovery room for further management.  The patient tolerated the procedure well and all counts were correct at the end of the case.   Howie Ill, MD

## 2018-05-21 NOTE — Transfer of Care (Signed)
Immediate Anesthesia Transfer of Care Note  Patient: Dawn Ortiz  Procedure(s) Performed: Procedure(s): IRRIGATION AND DEBRIDEMENT BREAST ABSCESS (Left) INCISION AND DRAINAGE ABSCESS- RIGHT THIGH (Right)  Patient Location: PACU  Anesthesia Type:General  Level of Consciousness: sedated  Airway & Oxygen Therapy: Patient Spontanous Breathing and Patient connected to face mask oxygen  Post-op Assessment: Report given to RN and Post -op Vital signs reviewed and stable  Post vital signs: Reviewed and stable  Last Vitals:  Vitals:   05/21/18 1233 05/21/18 1500  BP: (!) 136/95 130/79  Pulse: (!) 119 (!) 118  Resp: 12 16  Temp: 36.7 C (!) 36.2 C  SpO2: 97% 100%    Complications: No apparent anesthesia complications

## 2018-05-21 NOTE — Progress Notes (Signed)
05/21/2018  HPI: FLORIDA NOLTON is a 27 y.o. female s/p prior right breast abscess I&D.  She was seen last week with developing abscess over right breast.  Started on Clindamycin but no improvement.  She also reports that she's getting a new abscess over the right thigh.  Vital signs: BP (!) 151/91   Pulse 96   Temp (!) 97.5 F (36.4 C) (Temporal)   Resp 13   Ht 5\' 2"  (1.575 m)   Wt 182 lb 12.8 oz (82.9 kg)   SpO2 96%   BMI 33.43 kg/m    Physical Exam: Constitutional:  No acute distress Breast:  Left breast, inferior portion with mild erythema and some induration, but now with more fluctuance compared to prior exam.  She's more tender as well.   Skin:  Right thigh about 2 cm area of firmness, not much fluctuance.  Assessment/Plan: This is a 27 y.o. female s/p right breast abscess I&D, now with worsening left breast abscess and new right thigh abscess.  Will take patient to the OR today for I&D of left breast abscess and right thigh abscess.  Risks of bleeding, infection, and injury to surrounding structures have been discussed and she's willing to proceed.  She's added on to schedule for around 1:30 pm.  Should be able to d/c home after.   Howie Ill, MD [facility]

## 2018-05-21 NOTE — Discharge Instructions (Signed)

## 2018-05-21 NOTE — Anesthesia Postprocedure Evaluation (Signed)
Anesthesia Post Note  Patient: Dawn Ortiz  Procedure(s) Performed: IRRIGATION AND DEBRIDEMENT BREAST ABSCESS (Left Breast) INCISION AND DRAINAGE ABSCESS- RIGHT THIGH (Right Thigh)  Patient location during evaluation: PACU Anesthesia Type: General Level of consciousness: awake and alert Pain management: pain level controlled Vital Signs Assessment: post-procedure vital signs reviewed and stable Respiratory status: spontaneous breathing, nonlabored ventilation, respiratory function stable and patient connected to nasal cannula oxygen Cardiovascular status: blood pressure returned to baseline and stable Postop Assessment: no apparent nausea or vomiting Anesthetic complications: no     Last Vitals:  Vitals:   05/21/18 1558 05/21/18 1629  BP: (!) 144/81 (!) 142/94  Pulse: (!) 115 (!) 114  Resp: 16 16  Temp: (!) 36.3 C   SpO2: 96% 95%    Last Pain:  Vitals:   05/21/18 1558  TempSrc: Temporal  PainSc: 2                  Cornella Emmer S

## 2018-05-21 NOTE — Anesthesia Post-op Follow-up Note (Signed)
Anesthesia QCDR form completed.        

## 2018-05-21 NOTE — H&P (View-Only) (Signed)
05/21/2018  HPI: Dawn Ortiz is a 26 y.o. female s/p prior right breast abscess I&D.  She was seen last week with developing abscess over right breast.  Started on Clindamycin but no improvement.  She also reports that she's getting a new abscess over the right thigh.  Vital signs: BP (!) 151/91   Pulse 96   Temp (!) 97.5 F (36.4 C) (Temporal)   Resp 13   Ht 5' 2" (1.575 m)   Wt 182 lb 12.8 oz (82.9 kg)   SpO2 96%   BMI 33.43 kg/m    Physical Exam: Constitutional:  No acute distress Breast:  Left breast, inferior portion with mild erythema and some induration, but now with more fluctuance compared to prior exam.  She's more tender as well.   Skin:  Right thigh about 2 cm area of firmness, not much fluctuance.  Assessment/Plan: This is a 26 y.o. female s/p right breast abscess I&D, now with worsening left breast abscess and new right thigh abscess.  Will take patient to the OR today for I&D of left breast abscess and right thigh abscess.  Risks of bleeding, infection, and injury to surrounding structures have been discussed and she's willing to proceed.  She's added on to schedule for around 1:30 pm.  Should be able to d/c home after.   Cariah Salatino Luis Milen Lengacher, MD [facility]  

## 2018-05-21 NOTE — Anesthesia Preprocedure Evaluation (Signed)
Anesthesia Evaluation  Patient identified by MRN, date of birth, ID band Patient awake    Reviewed: Allergy & Precautions, H&P , NPO status , reviewed documented beta blocker date and time   Airway Mallampati: III  TM Distance: >3 FB Neck ROM: full    Dental  (+) Chipped   Pulmonary former smoker,    Pulmonary exam normal        Cardiovascular Normal cardiovascular exam  2017 ECHO Study Conclusions  - Left ventricle: The cavity size was normal. Wall thickness was   normal. Systolic function was normal. The estimated ejection   fraction was in the range of 60% to 65%. Wall motion was normal;   there were no regional wall motion abnormalities. Left   ventricular diastolic function parameters were normal.   Neuro/Psych PSYCHIATRIC DISORDERS Anxiety    GI/Hepatic GERD  Controlled,  Endo/Other  diabetes  Renal/GU      Musculoskeletal   Abdominal   Peds  Hematology   Anesthesia Other Findings Past Medical History: No date: Anxiety No date: Diabetes mellitus without complication (HCC) No date: GERD (gastroesophageal reflux disease) Past Surgical History: No date: INCISION AND DRAINAGE     Comment:  right breast x1 and left groin x1 and left axilla 07/02/2016: INCISION AND DRAINAGE ABSCESS; Left     Comment:  Procedure: INCISION AND DRAINAGE ABSCESS;  Surgeon:               Lattie Haw, MD;  Location: [facility] ORS;  Service:               General;  Laterality: Left; 04/18/2017: INCISION AND DRAINAGE ABSCESS; Left     Comment:  Procedure: INCISION AND DRAINAGE ABSCESS-LEFT GROIN;                Surgeon: Ancil Linsey, MD;  Location: [facility] ORS;                Service: General;  Laterality: Left; 04/09/2018: IRRIGATION AND DEBRIDEMENT ABSCESS; Right     Comment:  Procedure: IRRIGATION AND DEBRIDEMENT ABSCESS;  Surgeon:              Henrene Dodge, MD;  Location: [facility] ORS;  Service:               General;  Laterality:  Right; 12/03/2014: PILONIDAL CYST EXCISION     Comment:  Procedure: CYST EXCISION PILONIDAL EXTENSIVE;  Surgeon:               Duwaine Maxin, MD;  Location: [facility] ORS;  Service:               General;; BMI    Body Mass Index:  33.29 kg/m     Reproductive/Obstetrics                             Anesthesia Physical Anesthesia Plan  ASA: III  Anesthesia Plan: General LMA   Post-op Pain Management:    Induction: Intravenous  PONV Risk Score and Plan: 3 and Ondansetron, Treatment may vary due to age or medical condition and Midazolam  Airway Management Planned: LMA  Additional Equipment:   Intra-op Plan:   Post-operative Plan: Extubation in OR  Informed Consent: I have reviewed the patients History and Physical, chart, labs and discussed the procedure including the risks, benefits and alternatives for the proposed anesthesia with the patient or authorized representative who has indicated his/her understanding and acceptance.  Dental Advisory Given  Plan Discussed with: CRNA  Anesthesia Plan Comments:         Anesthesia Quick Evaluation

## 2018-05-21 NOTE — Anesthesia Procedure Notes (Signed)
Procedure Name: LMA Insertion Date/Time: 05/21/2018 1:43 PM Performed by: Stormy Fabian, CRNA Pre-anesthesia Checklist: Patient identified, Patient being monitored, Timeout performed, Emergency Drugs available and Suction available Patient Re-evaluated:Patient Re-evaluated prior to induction Oxygen Delivery Method: Circle system utilized Preoxygenation: Pre-oxygenation with 100% oxygen Induction Type: IV induction Ventilation: Mask ventilation without difficulty LMA: LMA inserted LMA Size: 3.5 Tube type: Oral Number of attempts: 1 Placement Confirmation: positive ETCO2 and breath sounds checked- equal and bilateral Tube secured with: Tape Dental Injury: Teeth and Oropharynx as per pre-operative assessment

## 2018-05-22 ENCOUNTER — Encounter: Payer: Self-pay | Admitting: Surgery

## 2018-05-27 LAB — AEROBIC/ANAEROBIC CULTURE W GRAM STAIN (SURGICAL/DEEP WOUND)

## 2018-05-27 LAB — AEROBIC/ANAEROBIC CULTURE (SURGICAL/DEEP WOUND)

## 2018-05-28 ENCOUNTER — Encounter: Payer: Self-pay | Admitting: Surgery

## 2018-05-28 ENCOUNTER — Other Ambulatory Visit: Payer: Self-pay

## 2018-05-28 ENCOUNTER — Ambulatory Visit (INDEPENDENT_AMBULATORY_CARE_PROVIDER_SITE_OTHER): Payer: Managed Care, Other (non HMO) | Admitting: Surgery

## 2018-05-28 VITALS — BP 129/90 | HR 99 | Temp 97.8°F | Ht 66.0 in | Wt 185.0 lb

## 2018-05-28 DIAGNOSIS — Z09 Encounter for follow-up examination after completed treatment for conditions other than malignant neoplasm: Secondary | ICD-10-CM

## 2018-05-28 DIAGNOSIS — L02415 Cutaneous abscess of right lower limb: Secondary | ICD-10-CM

## 2018-05-28 DIAGNOSIS — N611 Abscess of the breast and nipple: Secondary | ICD-10-CM

## 2018-05-28 NOTE — Patient Instructions (Addendum)
Please continue to pack the areas so it could heal from the inside out.  We will see you back in two weeks.  Please continue taking your antibiotic.  You are able to shower at this time but remember to apply a new gauze to the affected areas.

## 2018-05-29 ENCOUNTER — Encounter: Payer: Self-pay | Admitting: Surgery

## 2018-05-29 NOTE — Progress Notes (Signed)
05/29/2018  HPI: Dawn Ortiz is a 27 y.o. female s/p incision and drainage of left breast abscess and right thigh abscess on 05/21/2018.  She presents today for postop follow-up.  Patient reports that the pain has improved on both sites but neither wound has fully healed yet.  She reports that she never did any packing of her wounds as there were too tender to pack.  She continues taking her antibiotic.  Vital signs: BP 129/90   Pulse 99   Temp 97.8 F (36.6 C) (Temporal)   Ht 5\' 6"  (1.676 m)   Wt 185 lb (83.9 kg)   LMP 05/21/2018 (Exact Date)   BMI 29.86 kg/m    Physical Exam: Constitutional: No acute distress Skin: Left breast abscess I&D site healing well with good granulation tissue and healthy wound bed.  There is no further purulent drainage at this point.  The right thigh abscess also continues to heal and there is healthy tissue at the wound bed and granulation tissue.  Both wounds were dressed with dry gauze and tape.  Assessment/Plan: This is a 27 y.o. female s/p I&D of right thigh abscess and left breast abscess.  - Recommend the patient does does need to pack the wounds with a gauze given after her surgery in order to keep the wounds open so that they can heal from the inside out better. -She should continue taking her antibiotic until completed. -She will follow-up in 2 weeks to assess her wounds.   Howie Ill, MD [facility]

## 2018-05-30 LAB — AEROBIC/ANAEROBIC CULTURE W GRAM STAIN (SURGICAL/DEEP WOUND): Culture: NEGATIVE

## 2018-05-30 LAB — AEROBIC/ANAEROBIC CULTURE (SURGICAL/DEEP WOUND)

## 2018-06-11 ENCOUNTER — Ambulatory Visit: Payer: Managed Care, Other (non HMO) | Admitting: Surgery

## 2018-07-10 ENCOUNTER — Ambulatory Visit: Payer: Self-pay | Admitting: Emergency Medicine

## 2018-07-10 VITALS — BP 138/82 | HR 114 | Temp 98.4°F | Resp 12

## 2018-07-10 DIAGNOSIS — J029 Acute pharyngitis, unspecified: Secondary | ICD-10-CM

## 2018-07-10 LAB — POCT RAPID STREP A (OFFICE): RAPID STREP A SCREEN: NEGATIVE

## 2018-07-10 MED ORDER — FLUCONAZOLE 150 MG PO TABS: 150.0000 mg | ORAL_TABLET | Freq: Once | ORAL | 0 refills | Status: AC

## 2018-07-10 MED ORDER — AMOXICILLIN 875 MG PO TABS: 875.0000 mg | ORAL_TABLET | Freq: Two times a day (BID) | ORAL | 0 refills | Status: DC

## 2018-07-10 NOTE — Patient Instructions (Signed)

## 2018-07-10 NOTE — Progress Notes (Signed)
Subjective. For the last 24 to 48 hours patient has experienced pain in the right side of her throat.  This morning she looked at her right tonsil and saw there was an exudate there.  Of note a coworker was seen today and diagnosed with strep throat.  She has not had any significant fever.  She has been able to eat.  She has had mild congestion but no significant cough or productive sputum.  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  She has a long history of tachycardia which she always has when she goes to the physician. Review of systems. Denies fever or neck stiffness. Mild cough no chest pain. Persistent tachycardia. Endocrine no change in diabetes sugar has been stable on current insulin dosage. Objective. General.  Alert cooperative in no distress. Ears TMs clear. Nose no congestion. Throat tonsils are 2+.  There is whitish exudate over the right tonsil. Neck mild right anterior cervical adenopathy. Chest clear to auscultation. Heart rapid rate no murmur. Strep screen negative. Assessment. Right tonsillar enlargement with exudate. Plan. Check strep culture. Amoxicillin 875 twice daily. Diflucan for secondary yeast infection. Recheck if worsening symptoms over the next 48 hours.

## 2018-07-11 LAB — STREP A DNA PROBE: STREP GP A DIRECT, DNA PROBE: NEGATIVE

## 2018-08-05 ENCOUNTER — Encounter: Payer: Self-pay | Admitting: *Deleted

## 2019-01-14 ENCOUNTER — Telehealth: Payer: Self-pay

## 2019-01-14 NOTE — Telephone Encounter (Signed)
Copied from CRM #[id number]. Topic: General - Other >> Jan 14, 2019  4:13 PM [name], Karen A wrote: Reason for CRM: Patient called in reference to her upcoming appointment she would like to reschedule to an earlier or later date whichever is available. Please call patient at Ph# [phone] 

## 2019-01-15 ENCOUNTER — Telehealth: Payer: Self-pay

## 2019-01-15 NOTE — Telephone Encounter (Signed)
Copied from [place] [id number]. Topic: General - Other >> Jan 14, 2019  4:13 PM Leward Quan A wrote: Reason for CRM: Patient called in reference to her upcoming appointment she would like to reschedule to an earlier or later date whichever is available. Please call patient at Ph# [phone]

## 2019-02-03 ENCOUNTER — Ambulatory Visit (INDEPENDENT_AMBULATORY_CARE_PROVIDER_SITE_OTHER): Payer: Managed Care, Other (non HMO) | Admitting: Internal Medicine

## 2019-02-03 ENCOUNTER — Other Ambulatory Visit: Payer: Self-pay

## 2019-02-03 DIAGNOSIS — Z794 Long term (current) use of insulin: Secondary | ICD-10-CM

## 2019-02-03 DIAGNOSIS — E1165 Type 2 diabetes mellitus with hyperglycemia: Secondary | ICD-10-CM | POA: Diagnosis not present

## 2019-02-03 DIAGNOSIS — F419 Anxiety disorder, unspecified: Secondary | ICD-10-CM | POA: Diagnosis not present

## 2019-02-03 DIAGNOSIS — Z13818 Encounter for screening for other digestive system disorders: Secondary | ICD-10-CM

## 2019-02-03 DIAGNOSIS — Z113 Encounter for screening for infections with a predominantly sexual mode of transmission: Secondary | ICD-10-CM

## 2019-02-03 DIAGNOSIS — R195 Other fecal abnormalities: Secondary | ICD-10-CM

## 2019-02-03 DIAGNOSIS — R634 Abnormal weight loss: Secondary | ICD-10-CM

## 2019-02-03 DIAGNOSIS — R112 Nausea with vomiting, unspecified: Secondary | ICD-10-CM

## 2019-02-03 DIAGNOSIS — Z3046 Encounter for surveillance of implantable subdermal contraceptive: Secondary | ICD-10-CM

## 2019-02-03 DIAGNOSIS — K219 Gastro-esophageal reflux disease without esophagitis: Secondary | ICD-10-CM | POA: Diagnosis not present

## 2019-02-03 DIAGNOSIS — E559 Vitamin D deficiency, unspecified: Secondary | ICD-10-CM

## 2019-02-03 DIAGNOSIS — Z1159 Encounter for screening for other viral diseases: Secondary | ICD-10-CM

## 2019-02-03 DIAGNOSIS — Z1329 Encounter for screening for other suspected endocrine disorder: Secondary | ICD-10-CM

## 2019-02-03 MED ORDER — OMEPRAZOLE 40 MG PO CPDR: 40.0000 mg | DELAYED_RELEASE_CAPSULE | Freq: Every day | ORAL | 2 refills | Status: DC

## 2019-02-03 MED ORDER — BUSPIRONE HCL 5 MG PO TABS: 5.0000 mg | ORAL_TABLET | Freq: Two times a day (BID) | ORAL | 0 refills | Status: DC

## 2019-02-03 NOTE — Progress Notes (Addendum)
Virtual Visit via Video Note  I connected with Dawn Ortiz  on 02/03/19 at  9:09 AM EDT by a video enabled telemedicine application and verified that I am speaking with the correct person using two identifiers.  Location patient: car Location provider:work  Persons participating in the virtual visit: patient, provider  I discussed the limitations of evaluation and management by telemedicine and the availability of in person appointments. The patient expressed understanding and agreed to proceed.   HPI: 1. She c/o dark stools, n/v loss of wt 15 lbs in in 3 weeks sx's sicne 3 or 10/2018 with h/o stomach ulcer prilosec 20 mg otc is not helping. She feels her stomach is making a lot of gurgling noises  -she wants a referral to GI and has h/o stomach ulcer and GERD  2. DM 1 vs 2 uncontrolled A1C uncontrolled needs labs repeat on 70/30 40-45 units bid not started Ozempic or Tresiba 40 units yet A1C 8.4 03/18/17. Diabetes uncontrolled x 15 years  -will get labs done here in our clinic  -she wants  A referral to endocrine   3. Pt agreeable to STD cehck  4. C/o anxiety she stopped taking paxil 10 mg 04/2018 last pick up from pharmacy and wants to try medication for anxiety. Propranolol did not help with anxiety so stopped and paxil made her feel drowsy 5. Need to change nexplanon she has had since 10/2015   Former PCP [facility]    ROS: See pertinent positives and negatives per HPI. General c/o wt loss 15 lbs in 3 weeks HEENT: no sore throat  CV: no chest pain  Lungs: no sob  GI: dark stools, +n/v  MSK: no joint pain  Skin: no issues  Neuro: no h/a  Psych: +anxiety no depression  GU: no issues   Past Medical History:  Diagnosis Date  . Anxiety   . Diabetes mellitus without complication (HCC)   . GERD (gastroesophageal reflux disease)     Past Surgical History:  Procedure Laterality Date  . INCISION AND DRAINAGE     right breast x1 and left groin x1 and left axilla  .  INCISION AND DRAINAGE ABSCESS Left 07/02/2016   Procedure: INCISION AND DRAINAGE ABSCESS;  Surgeon: Lattie Hawichard E Cooper, MD;  Location: [facility];  Service: General;  Laterality: Left;  . INCISION AND DRAINAGE ABSCESS Left 04/18/2017   Procedure: INCISION AND DRAINAGE ABSCESS-LEFT GROIN;  Surgeon: Ancil Linseyavis, [name], MD;  Location: [facility];  Service: General;  Laterality: Left;  . INCISION AND DRAINAGE ABSCESS Right 05/21/2018   Procedure: INCISION AND DRAINAGE ABSCESS- RIGHT THIGH;  Surgeon: Henrene DodgePiscoya, Jose, MD;  Location: [facility];  Service: General;  Laterality: Right;  . IRRIGATION AND DEBRIDEMENT ABSCESS Right 04/09/2018   Procedure: IRRIGATION AND DEBRIDEMENT ABSCESS;  Surgeon: Henrene DodgePiscoya, Jose, MD;  Location: [facility];  Service: General;  Laterality: Right;  . IRRIGATION AND DEBRIDEMENT ABSCESS Left 05/21/2018   Procedure: IRRIGATION AND DEBRIDEMENT BREAST ABSCESS;  Surgeon: Henrene DodgePiscoya, Jose, MD;  Location: [facility];  Service: General;  Laterality: Left;  . PILONIDAL CYST EXCISION  12/03/2014   Procedure: CYST EXCISION PILONIDAL EXTENSIVE;  Surgeon: Duwaine MaxinWilliam Marterre, MD;  Location: [facility];  Service: General;;    Family History  Problem Relation Age of Onset  . Healthy Mother   . Hypertension Father   . Diabetes Father   . Heart disease Father   . Sarcoidosis Father   . Healthy Brother   . Obesity Brother   . Cancer Paternal Grandfather        ?  prostate   . Hypertension Other     SOCIAL HX:  No kids  Works for [employer]    Current Outpatient Medications:  .  insulin aspart protamine- aspart (NOVOLOG MIX 70/30) (70-30) 100 UNIT/ML injection, Inject 40-45 Units into the skin 2 (two) times daily with a meal., Disp: , Rfl:  .  lisinopril (PRINIVIL,ZESTRIL) 2.5 MG tablet, Take 2.5 mg by mouth daily., Disp: , Rfl:  .  busPIRone (BUSPAR) 5 MG tablet, Take 1 tablet (5 mg total) by mouth 2 (two) times daily., Disp: 60 tablet, Rfl: 0 .  etonogestrel (NEXPLANON) 68 MG IMPL implant, 1 each by Subdermal  route once., Disp: , Rfl:  .  Insulin Degludec (TRESIBA) 100 UNIT/ML SOLN, Inject 40 Units into the skin., Disp: , Rfl:  .  omeprazole (PRILOSEC) 40 MG capsule, Take 1 capsule (40 mg total) by mouth daily. 30 minutes before food, Disp: 60 capsule, Rfl: 2 .  ONE TOUCH ULTRA TEST test strip, TEST BLOOD SUGAR QID, Disp: , Rfl: 0 .  Semaglutide,0.25 or 0.5MG /DOS, (OZEMPIC, 0.25 OR 0.5 MG/DOSE,) 2 MG/1.5ML SOPN, Inject into the skin once a week., Disp: , Rfl:   EXAM:  VITALS per patient if applicable:  GENERAL: alert, oriented, appears well and in no acute distress  HEENT: atraumatic, conjunttiva clear, no obvious abnormalities on inspection of external nose and ears  NECK: normal movements of the head and neck  LUNGS: on inspection no signs of respiratory distress, breathing rate appears normal, no obvious gross SOB, gasping or wheezing  CV: no obvious cyanosis  MS: moves all visible extremities without noticeable abnormality  PSYCH/NEURO: pleasant and cooperative, no obvious depression or anxiety, speech and thought processing grossly intact  ASSESSMENT AND PLAN:  Discussed the following assessment and plan:  Gastroesophageal reflux disease h/o gastric ulcer with dark stools, n/v, weight loss Plan: omeprazole (PRILOSEC) 40 MG capsule increased from 20 mg, Ambulatory referral to Gastroenterology further w/u   Anxiety - Plan: busPIRone (BUSPAR) 5 MG tablet bid no help with paxil 10 nor propranolol in the past   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 She has had DM x 15 years Plan: Ambulatory referral to Endocrinology [facility] -sch fasting labs asap -pt was Rx tresiba 40 units and ozempic 0.25 by [facility] but has not started using and doing 70/30 40-45 units bid -will need to disc eye exam in future, foot exam, pna 23  -on ACEI check lipid and consider statin   Screen for sexually transmitted diseases -  Plan: HIV antibody (with reflex), Urine cytology ancillary only([place]), RPR, Hepatitis B surface antibody,quantitative, Hepatitis C antibody, Hepatitis B surface antigen, HSV(herpes smplx)abs-1+2(IgG+IgM)  HM She sometimes get the flu shot  Tdap check ncir for vaccines  Consider pna 23  Pap per pt 07/2017 (per pt no h/o abnormal) [facility] does not wish to go back referred Dr. Leeroy Bockhelsea Ward needs Nexplanon out  -will try to get record of pap   rec healthy diet and exercise   Former P[facility]    I discussed the assessment and treatment plan with the patient. The patient was provided an opportunity to ask questions and all were answered. The patient agreed with the plan and demonstrated an understanding of the instructions.   The patient was advised to call back or seek an in-person evaluation if the symptoms worsen or if the condition fails to improve as anticipated.  Time spent  25 minutes  Delorise Jackson, MD

## 2019-02-06 ENCOUNTER — Encounter: Payer: Self-pay | Admitting: Internal Medicine

## 2019-02-06 ENCOUNTER — Ambulatory Visit: Payer: Managed Care, Other (non HMO) | Admitting: Internal Medicine

## 2019-02-06 DIAGNOSIS — E119 Type 2 diabetes mellitus without complications: Secondary | ICD-10-CM | POA: Insufficient documentation

## 2019-02-06 DIAGNOSIS — F419 Anxiety disorder, unspecified: Secondary | ICD-10-CM | POA: Insufficient documentation

## 2019-02-06 DIAGNOSIS — Z794 Long term (current) use of insulin: Secondary | ICD-10-CM | POA: Insufficient documentation

## 2019-02-06 DIAGNOSIS — K219 Gastro-esophageal reflux disease without esophagitis: Secondary | ICD-10-CM | POA: Insufficient documentation

## 2019-02-06 DIAGNOSIS — E1165 Type 2 diabetes mellitus with hyperglycemia: Secondary | ICD-10-CM | POA: Insufficient documentation

## 2019-02-06 NOTE — Patient Instructions (Addendum)
Look online at The Next 56 days for tips on healthy eating with diabetes   Exercising to Lose Weight Exercise is structured, repetitive physical activity to improve fitness and health. Getting regular exercise is important for everyone. It is especially important if you are overweight. Being overweight increases your risk of heart disease, stroke, diabetes, high blood pressure, and several types of cancer. Reducing your calorie intake and exercising can help you lose weight. Exercise is usually categorized as moderate or vigorous intensity. To lose weight, most people need to do a certain amount of moderate-intensity or vigorous-intensity exercise each week. Moderate-intensity exercise  Moderate-intensity exercise is any activity that gets you moving enough to burn at least three times more energy (calories) than if you were sitting. Examples of moderate exercise include:  Walking a mile in 15 minutes.  Doing light yard work.  Biking at an easy pace. Most people should get at least 150 minutes (2 hours and 30 minutes) a week of moderate-intensity exercise to maintain their body weight. Vigorous-intensity exercise Vigorous-intensity exercise is any activity that gets you moving enough to burn at least six times more calories than if you were sitting. When you exercise at this intensity, you should be working hard enough that you are not able to carry on a conversation. Examples of vigorous exercise include:  Running.  Playing a team sport, such as football, basketball, and soccer.  Jumping rope. Most people should get at least 75 minutes (1 hour and 15 minutes) a week of vigorous-intensity exercise to maintain their body weight. How can exercise affect me? When you exercise enough to burn more calories than you eat, you lose weight. Exercise also reduces body fat and builds muscle. The more muscle you have, the more calories you burn. Exercise also:  Improves mood.  Reduces stress and  tension.  Improves your overall fitness, flexibility, and endurance.  Increases bone strength. The amount of exercise you need to lose weight depends on:  Your age.  The type of exercise.  Any health conditions you have.  Your overall physical ability. Talk to your health care provider about how much exercise you need and what types of activities are safe for you. What actions can I take to lose weight? Nutrition   Make changes to your diet as told by your health care provider or diet and nutrition specialist (dietitian). This may include: ? Eating fewer calories. ? Eating more protein. ? Eating less unhealthy fats. ? Eating a diet that includes fresh fruits and vegetables, whole grains, low-fat dairy products, and lean protein. ? Avoiding foods with added fat, salt, and sugar.  Drink plenty of water while you exercise to prevent dehydration or heat stroke. Activity  Choose an activity that you enjoy and set realistic goals. Your health care provider can help you make an exercise plan that works for you.  Exercise at a moderate or vigorous intensity most days of the week. ? The intensity of exercise may vary from person to person. You can tell how intense a workout is for you by paying attention to your breathing and heartbeat. Most people will notice their breathing and heartbeat get faster with more intense exercise.  Do resistance training twice each week, such as: ? Push-ups. ? Sit-ups. ? Lifting weights. ? Using resistance bands.  Getting short amounts of exercise can be just as helpful as long structured periods of exercise. If you have trouble finding time to exercise, try to include exercise in your daily routine. ?  Get up, stretch, and walk around every 30 minutes throughout the day. ? Go for a walk during your lunch break. ? Park your car farther away from your destination. ? If you take public transportation, get off one stop early and walk the rest of the  way. ? Make phone calls while standing up and walking around. ? Take the stairs instead of elevators or escalators.  Wear comfortable clothes and shoes with good support.  Do not exercise so much that you hurt yourself, feel dizzy, or get very short of breath. Where to find more information  [place] Department of Health and Human Services: ThisPath.fi  Centers for Disease Control and Prevention (CDC): FootballExhibition.com.br Contact a health care provider:  Before starting a new exercise program.  If you have questions or concerns about your weight.  If you have a medical problem that keeps you from exercising. Get help right away if you have any of the following while exercising:  Injury.  Dizziness.  Difficulty breathing or shortness of breath that does not go away when you stop exercising.  Chest pain.  Rapid heartbeat. Summary  Being overweight increases your risk of heart disease, stroke, diabetes, high blood pressure, and several types of cancer.  Losing weight happens when you burn more calories than you eat.  Reducing the amount of calories you eat in addition to getting regular moderate or vigorous exercise each week helps you lose weight. This information is not intended to replace advice given to you by your health care provider. Make sure you discuss any questions you have with your health care provider. Document Released: 08/18/2010 Document Revised: 07/29/2017 Document Reviewed: 07/29/2017 Elsevier Patient Education  2020 [employer].  Diabetes Mellitus and Nutrition, Adult When you have diabetes (diabetes mellitus), it is very important to have healthy eating habits because your blood sugar (glucose) levels are greatly affected by what you eat and drink. Eating healthy foods in the appropriate amounts, at about the same times every day, can help you:  Control your blood glucose.  Lower your risk of heart disease.  Improve your blood pressure.  Reach or maintain a  healthy weight. Every person with diabetes is different, and each person has different needs for a meal plan. Your health care provider may recommend that you work with a diet and nutrition specialist (dietitian) to make a meal plan that is best for you. Your meal plan may vary depending on factors such as:  The calories you need.  The medicines you take.  Your weight.  Your blood glucose, blood pressure, and cholesterol levels.  Your activity level.  Other health conditions you have, such as heart or kidney disease. How do carbohydrates affect me? Carbohydrates, also called carbs, affect your blood glucose level more than any other type of food. Eating carbs naturally raises the amount of glucose in your blood. Carb counting is a method for keeping track of how many carbs you eat. Counting carbs is important to keep your blood glucose at a healthy level, especially if you use insulin or take certain oral diabetes medicines. It is important to know how many carbs you can safely have in each meal. This is different for every person. Your dietitian can help you calculate how many carbs you should have at each meal and for each snack. Foods that contain carbs include:  Bread, cereal, rice, pasta, and crackers.  Potatoes and corn.  Peas, beans, and lentils.  Milk and yogurt.  Fruit and juice.  Desserts,  such as cakes, cookies, ice cream, and candy. How does alcohol affect me? Alcohol can cause a sudden decrease in blood glucose (hypoglycemia), especially if you use insulin or take certain oral diabetes medicines. Hypoglycemia can be a life-threatening condition. Symptoms of hypoglycemia (sleepiness, dizziness, and confusion) are similar to symptoms of having too much alcohol. If your health care provider says that alcohol is safe for you, follow these guidelines:  Limit alcohol intake to no more than 1 drink per day for nonpregnant women and 2 drinks per day for men. One drink equals 12  oz of beer, 5 oz of wine, or 1 oz of hard liquor.  Do not drink on an empty stomach.  Keep yourself hydrated with water, diet soda, or unsweetened iced tea.  Keep in mind that regular soda, juice, and other mixers may contain a lot of sugar and must be counted as carbs. What are tips for following this plan?  Reading food labels  Start by checking the serving size on the "Nutrition Facts" label of packaged foods and drinks. The amount of calories, carbs, fats, and other nutrients listed on the label is based on one serving of the item. Many items contain more than one serving per package.  Check the total grams (g) of carbs in one serving. You can calculate the number of servings of carbs in one serving by dividing the total carbs by 15. For example, if a food has 30 g of total carbs, it would be equal to 2 servings of carbs.  Check the number of grams (g) of saturated and trans fats in one serving. Choose foods that have low or no amount of these fats.  Check the number of milligrams (mg) of salt (sodium) in one serving. Most people should limit total sodium intake to less than 2,300 mg per day.  Always check the nutrition information of foods labeled as "low-fat" or "nonfat". These foods may be higher in added sugar or refined carbs and should be avoided.  Talk to your dietitian to identify your daily goals for nutrients listed on the label. Shopping  Avoid buying canned, premade, or processed foods. These foods tend to be high in fat, sodium, and added sugar.  Shop around the outside edge of the grocery store. This includes fresh fruits and vegetables, bulk grains, fresh meats, and fresh dairy. Cooking  Use low-heat cooking methods, such as baking, instead of high-heat cooking methods like deep frying.  Cook using healthy oils, such as olive, canola, or sunflower oil.  Avoid cooking with butter, cream, or high-fat meats. Meal planning  Eat meals and snacks regularly,  preferably at the same times every day. Avoid going long periods of time without eating.  Eat foods high in fiber, such as fresh fruits, vegetables, beans, and whole grains. Talk to your dietitian about how many servings of carbs you can eat at each meal.  Eat 4-6 ounces (oz) of lean protein each day, such as lean meat, chicken, fish, eggs, or tofu. One oz of lean protein is equal to: ? 1 oz of meat, chicken, or fish. ? 1 egg. ?  cup of tofu.  Eat some foods each day that contain healthy fats, such as avocado, nuts, seeds, and fish. Lifestyle  Check your blood glucose regularly.  Exercise regularly as told by your health care provider. This may include: ? 150 minutes of moderate-intensity or vigorous-intensity exercise each week. This could be brisk walking, biking, or water aerobics. ? Stretching and doing strength  exercises, such as yoga or weightlifting, at least 2 times a week.  Take medicines as told by your health care provider.  Do not use any products that contain nicotine or tobacco, such as cigarettes and e-cigarettes. If you need help quitting, ask your health care provider.  Work with a Veterinary surgeon or diabetes educator to identify strategies to manage stress and any emotional and social challenges. Questions to ask a health care provider  Do I need to meet with a diabetes educator?  Do I need to meet with a dietitian?  What number can I call if I have questions?  When are the best times to check my blood glucose? Where to find more information:  American Diabetes Association: diabetes.org  Academy of Nutrition and Dietetics: www.eatright.[employer] of Diabetes and Digestive and Kidney Diseases (NIH): CarFlippers.tn Summary  A healthy meal plan will help you control your blood glucose and maintain a healthy lifestyle.  Working with a diet and nutrition specialist (dietitian) can help you make a meal plan that is best for you.  Keep in mind that  carbohydrates (carbs) and alcohol have immediate effects on your blood glucose levels. It is important to count carbs and to use alcohol carefully. This information is not intended to replace advice given to you by your health care provider. Make sure you discuss any questions you have with your health care provider. Document Released: 04/12/2005 Document Revised: 06/28/2017 Document Reviewed: 08/20/2016 Elsevier Patient Education  2020 Elsevier Inc.    Genital Herpes Genital herpes is a common sexually transmitted infection (STI) that is caused by a virus. The virus spreads from person to person through sexual contact. Infection can cause itching, blisters, and sores around the genitals or rectum. Symptoms may last several days and then go away This is called an outbreak. However, the virus remains in your body, so you may have more outbreaks in the future. The time between outbreaks varies and can be months or years. Genital herpes affects men and women. It is particularly concerning for pregnant women because the virus can be passed to the baby during delivery and can cause serious problems. Genital herpes is also a concern for people who have a weak disease-fighting (immune) system. What are the causes? This condition is caused by the herpes simplex virus (HSV) type 1 or type 2. The virus may spread through:  Sexual contact with an infected person, including vaginal, anal, and oral sex.  Contact with fluid from a herpes sore.  The skin. This means that you can get herpes from an infected partner even if he or she does not have a visible sore or does not know that he or she is infected. What increases the risk? You are more likely to develop this condition if:  You have sex with many partners.  You do not use latex condoms during sex. What are the signs or symptoms? Most people do not have symptoms (asymptomatic) or have mild symptoms that may be mistaken for other skin problems.  Symptoms may include:  Small red bumps near the genitals, rectum, or mouth. These bumps turn into blisters and then turn into sores.  Flu-like symptoms, including: ? Fever. ? Body aches. ? Swollen lymph nodes. ? Headache.  Painful urination.  Pain and itching in the genital area or rectal area.  Vaginal discharge.  Tingling or shooting pain in the legs and buttocks. Generally, symptoms are more severe and last longer during the first (primary) outbreak. Flu-like symptoms are  also more common during the primary outbreak. How is this diagnosed? Genital herpes may be diagnosed based on:  A physical exam.  Your medical history.  Blood tests.  A test of a fluid sample (culture) from an open sore. How is this treated? There is no cure for this condition, but treatment with antiviral medicines that are taken by mouth (orally) can do the following:  Speed up healing and relieve symptoms.  Help to reduce the spread of the virus to sexual partners.  Limit the chance of future outbreaks, or make future outbreaks shorter.  Lessen symptoms of future outbreaks. Your health care provider may also recommend pain relief medicines, such as aspirin or ibuprofen. Follow these instructions at home: Sexual activity  Do not have sexual contact during active outbreaks.  Practice safe sex. Latex condoms and female condoms may help prevent the spread of the herpes virus. General instructions  Keep the affected areas dry and clean.  Take over-the-counter and prescription medicines only as told by your health care provider.  Avoid rubbing or touching blisters and sores. If you do touch blisters or sores: ? Wash your hands thoroughly with soap and water. ? Do not touch your eyes afterward.  To help relieve pain or itching, you may take the following actions as directed by your health care provider: ? Apply a cold, wet cloth (cold compress) to affected areas 4-6 times a day. ? Apply a  substance that protects your skin and reduces bleeding (astringent). ? Apply a gel that helps relieve pain around sores (lidocaine gel). ? Take a warm, shallow bath that cleans the genital area (sitz bath).  Keep all follow-up visits as told by your health care provider. This is important. How is this prevented?  Use condoms. Although anyone can get genital herpes during sexual contact, even with the use of a condom, a condom can provide some protection.  Avoid having multiple sexual partners.  Talk with your sexual partner about any symptoms either of you may have. Also, talk with your partner about any history of STIs.  Get tested for STIs before you have sex. Ask your partner to do the same.  Do not have sexual contact if you have symptoms of genital herpes. Contact a health care provider if:  Your symptoms are not improving with medicine.  Your symptoms return.  You have new symptoms.  You have a fever.  You have abdominal pain.  You have redness, swelling, or pain in your eye.  You notice new sores on other parts of your body.  You are a woman and experience bleeding between menstrual periods.  You have had herpes and you become pregnant or plan to become pregnant. Summary  Genital herpes is a common sexually transmitted infection (STI) that is caused by the herpes simplex virus (HSV) type 1 or type 2.  These viruses are most often spread through sexual contact with an infected person.  You are more likely to develop this condition if you have sex with many partners or you have unprotected sex.  Most people do not have symptoms (asymptomatic) or have mild symptoms that may be mistaken for other skin problems. Symptoms occur as outbreaks that may happen months or years apart.  There is no cure for this condition, but treatment with oral antiviral medicines can reduce symptoms, reduce the chance of spreading the virus to a partner, prevent future outbreaks, or shorten  future outbreaks. This information is not intended to replace advice given to you  by your health care provider. Make sure you discuss any questions you have with your health care provider. Document Released: 07/13/2000 Document Revised: 01/20/2018 Document Reviewed: 06/15/2016 Elsevier Patient Education  2020 Elsevier Inc.  Cold Sore  A cold sore, also called a fever blister, is a small, fluid-filled sore that forms inside the mouth or on the lips, gums, nose, chin, or cheeks. Cold sores can spread to other parts of the body, such as the eyes or fingers. In some people who have other medical conditions, cold sores can spread to multiple other body sites, including the genitals. Cold sores can spread from person to person (are contagious) until the sores crust over completely. Most cold sores go away within 2 weeks. What are the causes? Cold sores are caused by an infection from a common type of herpes simplex virus (HSV-1). HSV-1 is closely related to the HSV-2virus, which is the virus that causes genital herpes, but these viruses are not the same. Once a person is infected with HSV-1, the virus remains permanently in the body. HSV-1 is spread from person to person through close contact, such as through kissing, touching the affected area, or sharing personal items such as lip balm, razors, a drinking glass, or eating utensils. What increases the risk? You are more likely to develop this condition if you:  Are tired, stressed, or sick.  Are menstruating.  Are pregnant.  Take certain medicines.  Are exposed to cold weather or too much sun. What are the signs or symptoms? Symptoms of a cold sore outbreak go through different stages. These are the stages of a cold sore:  Tingling, itching, or burning is felt 1-2 days before the outbreak.  Fluid-filled blisters appear on the lips, inside the mouth, on the nose, or on the cheeks.  The blisters start to ooze clear fluid.  The blisters dry  up, and a yellow crust appears in their place.  The crust falls off. In some cases, other symptoms can develop during a cold sore outbreak. These can include:  Fever.  Sore throat.  Headache.  Muscle aches.  Swollen neck glands. How is this diagnosed? This condition is diagnosed based on your medical history and a physical exam. Your health care provider may do a blood test or may swab some fluid from your sore and then examine the swab in the lab. How is this treated? There is no cure for cold sores or HSV-1. There is also no vaccine for HSV-1. Most cold sores go away on their own without treatment within 2 weeks. Medicines cannot make the infection go away, but your health care provider may prescribe medicines to:  Help relieve some of the pain associated with the sores.  Work to stop the virus from multiplying.  Shorten healing time. Medicines may be in the form of creams, gels, pills, or a shot. Follow these instructions at home: Medicines  Take or apply over-the-counter and prescription medicines only as told by your health care provider.  Use a cotton-tip swab to apply creams or gels to your sores.  Ask your health care provider if you can take lysine supplements. Research has found that lysine may help heal the cold sore faster and prevent outbreaks. Sore care   Do not touch the sores or pick the scabs.  Wash your hands often. Do not touch your eyes without washing your hands first.  Keep the sores clean and dry.  If directed, apply ice to the sores: ? Put ice in a  plastic bag. ? Place a towel between your skin and the bag. ? Leave the ice on for 20 minutes, 2-3 times a day. Eating and drinking  Eat a soft, bland diet. Avoid eating hot, cold, or salty foods.  Use a straw if it hurts to drink out of a glass.  Eat foods that are rich in lysine, such as meat, fish, and dairy products.  Avoid sugary foods, chocolates, nuts, and grains. These foods are rich in  a nutrient called arginine, which can cause the virus to multiply. Lifestyle  Do not kiss, have oral sex, or share personal items until your sores heal.  Stress, poor sleep, and being out in the sun can trigger outbreaks. Make sure you: ? Do activities that help you relax, such as deep breathing exercises or meditation. ? Get enough sleep. ? Apply sunscreen on your lips before you go out in the sun. Contact a health care provider if:  You have symptoms for more than 2 weeks.  You have pus coming from the sores.  You have redness that is spreading.  You have pain or irritation in your eye.  You get sores on your genitals.  Your sores do not heal within 2 weeks.  You have frequent cold sore outbreaks. Get help right away if you have:  A fever and your symptoms suddenly get worse.  A headache and confusion.  Fatigue or loss of appetite.  A stiff neck or sensitivity to light. Summary  A cold sore, also called a fever blister, is a small, fluid-filled sore that forms inside the mouth or on the lips, gums, nose, chin, or cheeks.  Most cold sores go away on their own without treatment within 2 weeks. Your health care provider may prescribe medicines to help relieve some of the pain, work to stop the virus from multiplying, and shorten healing time.  Wash your hands often. Do not touch your eyes without washing your hands first.  Do not kiss, have oral sex, or share personal items until your sores heal.  Contact a health care provider if your sores do not heal within 2 weeks. This information is not intended to replace advice given to you by your health care provider. Make sure you discuss any questions you have with your health care provider. Document Released: 07/13/2000 Document Revised: 11/05/2018 Document Reviewed: 12/16/2017 Elsevier Patient Education  2020 [employer].

## 2019-02-13 ENCOUNTER — Ambulatory Visit: Payer: Managed Care, Other (non HMO) | Admitting: Internal Medicine

## 2019-02-19 ENCOUNTER — Encounter: Payer: Self-pay | Admitting: Gastroenterology

## 2019-02-23 ENCOUNTER — Telehealth: Payer: Self-pay

## 2019-02-23 NOTE — Telephone Encounter (Signed)
Copied from [place] [phone]. Topic: Appointment Scheduling - Scheduling Inquiry for Clinic >> Feb 20, 2019  5:51 PM [name] wrote: Reason for CRM: Pt called to schedule appt for labs. Pt requests call back.

## 2019-02-26 ENCOUNTER — Other Ambulatory Visit (HOSPITAL_COMMUNITY)
Admission: RE | Admit: 2019-02-26 | Discharge: 2019-02-26 | Disposition: A | Discharge status: Home / Self Care | Payer: Managed Care, Other (non HMO) | Source: Ambulatory Visit | Attending: Internal Medicine | Admitting: Internal Medicine

## 2019-02-26 ENCOUNTER — Other Ambulatory Visit (INDEPENDENT_AMBULATORY_CARE_PROVIDER_SITE_OTHER): Payer: Managed Care, Other (non HMO)

## 2019-02-26 ENCOUNTER — Other Ambulatory Visit: Payer: Self-pay

## 2019-02-26 DIAGNOSIS — Z1159 Encounter for screening for other viral diseases: Secondary | ICD-10-CM

## 2019-02-26 DIAGNOSIS — E1165 Type 2 diabetes mellitus with hyperglycemia: Secondary | ICD-10-CM

## 2019-02-26 DIAGNOSIS — Z113 Encounter for screening for infections with a predominantly sexual mode of transmission: Secondary | ICD-10-CM | POA: Insufficient documentation

## 2019-02-26 DIAGNOSIS — Z1329 Encounter for screening for other suspected endocrine disorder: Secondary | ICD-10-CM

## 2019-02-26 DIAGNOSIS — E559 Vitamin D deficiency, unspecified: Secondary | ICD-10-CM

## 2019-02-26 DIAGNOSIS — Z13818 Encounter for screening for other digestive system disorders: Secondary | ICD-10-CM | POA: Insufficient documentation

## 2019-02-26 DIAGNOSIS — Z794 Long term (current) use of insulin: Secondary | ICD-10-CM

## 2019-02-26 NOTE — Addendum Note (Signed)
Addended by: Leeanne Rio on: 02/26/2019 08:29 AM   Modules accepted: Orders

## 2019-02-27 ENCOUNTER — Ambulatory Visit: Payer: Self-pay | Admitting: *Deleted

## 2019-02-27 LAB — MICROSCOPIC EXAMINATION: Casts: NONE SEEN /lpf

## 2019-02-27 LAB — URINALYSIS, ROUTINE W REFLEX MICROSCOPIC
Bilirubin, UA: NEGATIVE
Glucose, UA: NEGATIVE
Ketones, UA: NEGATIVE
Leukocytes,UA: NEGATIVE
Nitrite, UA: NEGATIVE
Protein,UA: NEGATIVE
Specific Gravity, UA: 1.016 (ref 1.005–1.030)
Urobilinogen, Ur: 0.2 mg/dL (ref 0.2–1.0)
pH, UA: 5.5 (ref 5.0–7.5)

## 2019-02-27 LAB — MICROALBUMIN / CREATININE URINE RATIO
Creatinine, Urine: 78 mg/dL
Microalb/Creat Ratio: 40 mg/g creat — ABNORMAL HIGH (ref 0–29)
Microalbumin, Urine: 31.4 ug/mL

## 2019-02-27 NOTE — Telephone Encounter (Signed)
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 Stated she has never had an outbreak. Reviewed s/sx of the condition and encouraged protection at all times. She is wanting to know more information regarding contracting and spreading the virus. She would like Dr. Olivia Mackie to send this information requested via [facility]. Routing to pcp for further review.   Reason for Disposition . [1] Follow-up call to recent contact AND [2] information only call, no triage required  Answer Assessment - Initial Assessment Questions 1. REASON FOR CALL or QUESTION: "What is your reason for calling today?" or "How can I best help you?" or "What question do you have that I can help answer?"     Patient reviewed lab results and had questions regarding the HSV2.  Protocols used: INFORMATION ONLY CALL - NO TRIAGE-A-AH

## 2019-02-28 LAB — URINE CYTOLOGY ANCILLARY ONLY
Chlamydia: NEGATIVE
Neisseria Gonorrhea: NEGATIVE
Trichomonas: NEGATIVE

## 2019-03-02 ENCOUNTER — Encounter: Payer: Self-pay | Admitting: Internal Medicine

## 2019-03-02 DIAGNOSIS — N611 Abscess of the breast and nipple: Secondary | ICD-10-CM

## 2019-03-02 HISTORY — DX: Abscess of the breast and nipple: N61.1

## 2019-03-02 LAB — URINE CYTOLOGY ANCILLARY ONLY
Bacterial vaginitis: NEGATIVE
Candida vaginitis: NEGATIVE

## 2019-03-03 ENCOUNTER — Encounter (INDEPENDENT_AMBULATORY_CARE_PROVIDER_SITE_OTHER): Payer: Managed Care, Other (non HMO) | Admitting: Internal Medicine

## 2019-03-03 ENCOUNTER — Other Ambulatory Visit: Payer: Self-pay | Admitting: Internal Medicine

## 2019-03-03 ENCOUNTER — Encounter: Payer: Self-pay | Admitting: Surgery

## 2019-03-03 DIAGNOSIS — L02415 Cutaneous abscess of right lower limb: Secondary | ICD-10-CM

## 2019-03-03 DIAGNOSIS — F419 Anxiety disorder, unspecified: Secondary | ICD-10-CM

## 2019-03-03 DIAGNOSIS — R7989 Other specified abnormal findings of blood chemistry: Secondary | ICD-10-CM

## 2019-03-03 LAB — LIPID PANEL
Chol/HDL Ratio: 3.4 ratio (ref 0.0–4.4)
Cholesterol, Total: 127 mg/dL (ref 100–199)
HDL: 37 mg/dL — ABNORMAL LOW (ref >39)
LDL Calculated: 71 mg/dL (ref 0–99)
Triglycerides: 96 mg/dL (ref 0–149)
VLDL Cholesterol Cal: 19 mg/dL (ref 5–40)

## 2019-03-03 LAB — HSV(HERPES SMPLX)ABS-I+II(IGG+IGM)-BLD
HSV 1 Glycoprotein G Ab, IgG: 32.2 index — ABNORMAL HIGH (ref 0.00–0.90)
HSV 2 IgG, Type Spec: 3.5 index — ABNORMAL HIGH (ref 0.00–0.90)
HSVI/II Comb IgM: <0.91 Ratio (ref 0.00–0.90)

## 2019-03-03 LAB — CBC WITH DIFFERENTIAL/PLATELET
Basophils Absolute: 0 10*3/uL (ref 0.0–0.2)
Basos: 0 %
EOS (ABSOLUTE): 0.1 10*3/uL (ref 0.0–0.4)
Eos: 1 %
Hematocrit: 37.4 % (ref 34.0–46.6)
Hemoglobin: 12.3 g/dL (ref 11.1–15.9)
Immature Grans (Abs): 0 10*3/uL (ref 0.0–0.1)
Immature Granulocytes: 0 %
Lymphocytes Absolute: 3.3 10*3/uL — ABNORMAL HIGH (ref 0.7–3.1)
Lymphs: 35 %
MCH: 25.4 pg — ABNORMAL LOW (ref 26.6–33.0)
MCHC: 32.9 g/dL (ref 31.5–35.7)
MCV: 77 fL — ABNORMAL LOW (ref 79–97)
Monocytes Absolute: 0.6 10*3/uL (ref 0.1–0.9)
Monocytes: 6 %
Neutrophils Absolute: 5.6 10*3/uL (ref 1.4–7.0)
Neutrophils: 58 %
Platelets: 358 10*3/uL (ref 150–450)
RBC: 4.85 x10E6/uL (ref 3.77–5.28)
RDW: 14.2 % (ref 11.7–15.4)
WBC: 9.6 10*3/uL (ref 3.4–10.8)

## 2019-03-03 LAB — COMPREHENSIVE METABOLIC PANEL
ALT: 33 IU/L — ABNORMAL HIGH (ref 0–32)
AST: 31 IU/L (ref 0–40)
Albumin/Globulin Ratio: 1.6 (ref 1.2–2.2)
Albumin: 4.6 g/dL (ref 3.9–5.0)
Alkaline Phosphatase: 144 IU/L — ABNORMAL HIGH (ref 39–117)
BUN/Creatinine Ratio: 21 (ref 9–23)
BUN: 16 mg/dL (ref 6–20)
Bilirubin Total: <0.2 mg/dL (ref 0.0–1.2)
CO2: 18 mmol/L — ABNORMAL LOW (ref 20–29)
Calcium: 9.9 mg/dL (ref 8.7–10.2)
Chloride: 106 mmol/L (ref 96–106)
Creatinine, Ser: 0.76 mg/dL (ref 0.57–1.00)
GFR calc Af Amer: 124 mL/min/{1.73_m2} (ref >59)
GFR calc non Af Amer: 108 mL/min/{1.73_m2} (ref >59)
Globulin, Total: 2.9 g/dL (ref 1.5–4.5)
Glucose: 143 mg/dL — ABNORMAL HIGH (ref 65–99)
Potassium: 4.4 mmol/L (ref 3.5–5.2)
Sodium: 143 mmol/L (ref 134–144)
Total Protein: 7.5 g/dL (ref 6.0–8.5)

## 2019-03-03 LAB — HIV ANTIBODY (ROUTINE TESTING W REFLEX): HIV Screen 4th Generation wRfx: NONREACTIVE

## 2019-03-03 LAB — TSH: TSH: 0.773 u[IU]/mL (ref 0.450–4.500)

## 2019-03-03 LAB — VITAMIN D 25 HYDROXY (VIT D DEFICIENCY, FRACTURES): Vit D, 25-Hydroxy: 30.9 ng/mL (ref 30.0–100.0)

## 2019-03-03 LAB — HEPATITIS C ANTIBODY: Hep C Virus Ab: 0.1 s/co ratio (ref 0.0–0.9)

## 2019-03-03 LAB — HEPATITIS B SURFACE ANTIGEN: Hepatitis B Surface Ag: NEGATIVE

## 2019-03-03 LAB — HEMOGLOBIN A1C
Est. average glucose Bld gHb Est-mCnc: 240 mg/dL
Hgb A1c MFr Bld: 10 % — ABNORMAL HIGH (ref 4.8–5.6)

## 2019-03-03 LAB — HSV-2 IGG SUPPLEMENTAL TEST: HSV-2 IgG Supplemental Test: POSITIVE — AB

## 2019-03-03 LAB — HEPATITIS B SURFACE ANTIBODY, QUANTITATIVE: Hepatitis B Surf Ab Quant: 3.3 m[IU]/mL — ABNORMAL LOW (ref >9.9)

## 2019-03-03 LAB — RPR: RPR Ser Ql: NONREACTIVE

## 2019-03-03 MED ORDER — BUSPIRONE HCL 5 MG PO TABS: 10.0000 mg | ORAL_TABLET | Freq: Two times a day (BID) | ORAL | 0 refills | Status: DC

## 2019-03-03 MED ORDER — DOXYCYCLINE HYCLATE 100 MG PO TABS: 100.0000 mg | ORAL_TABLET | Freq: Two times a day (BID) | ORAL | 0 refills | Status: DC

## 2019-03-03 MED ORDER — BUSPIRONE HCL 10 MG PO TABS: 10.0000 mg | ORAL_TABLET | Freq: Two times a day (BID) | ORAL | 3 refills | Status: DC

## 2019-03-03 NOTE — Telephone Encounter (Signed)
Yes I agree. I have attached a photo     Will the hydrocyzine make me sleepy ?  Also, I am connected with a therapist through my job.     ----- Message -----  From: Delorise Jackson, MD  Sent: 03/03/19, 1:13 PM  To: [name]  Subject: RE: Visit Follow-Up Question    If this is a new issue discussed via my chart we are billing for this are you agreeable? I.e antibiotic prescription for thigh abscess     I would need you to upload a picture to my chart as well     The next dose of buspirone is 10 mg 2x per day do you want to try this?     We also use hydroxyzine for anxiety with is an allergy pill for anxiety   Also adding something like zoloft, celexa, lexapro may help but takes up to 4 weeks to see a difference     What would you like to do?   Do you want a referral to the therapist as well?           ----- Message -----    From:Dawn Ortiz    Sent:03/03/2019 11:41 AM EDT     PR:FFMBW N McLean-Scocuzza, MD  Subject:Visit Follow-Up Question    Good morning,    I wanted to check on the possibility of getting a prescription for a dose of antibiotics... I have an abscess forming on my inner left thigh and wanted to see if I could get ahead of it before revisiting my surgeon. Also, to update you on the buspirone ... I can tell a difference from taking the last medication, however I'm not sure if I need a stronger dosage or an additional medication for as needed or even both. I can tell somewhat of a difference in my overall anxiety and an increase in my ability to focus but I still have had several anxiety attacks since beginning the medication. Please let me know what you advise for both situations. Thank you in advance.   A/P   Right thigh abscess  Doxycycline 100 mg bid x 10 days  rec still sch appt with surgery Dr. Hampton Abbot   Anxiety  Increase buspar to 10 mg bid  Pt c/w hydroxyzine making her sleepy  Continue therapy at work  Consider adding  SSRI/SNRI for anxiety in future   Time spent 5-10 minutes see above pt agreeable to fee

## 2019-03-04 ENCOUNTER — Other Ambulatory Visit: Payer: Self-pay

## 2019-03-04 ENCOUNTER — Telehealth: Payer: Self-pay | Admitting: *Deleted

## 2019-03-04 ENCOUNTER — Encounter: Payer: Self-pay | Admitting: Surgery

## 2019-03-04 ENCOUNTER — Ambulatory Visit (INDEPENDENT_AMBULATORY_CARE_PROVIDER_SITE_OTHER): Payer: Managed Care, Other (non HMO) | Admitting: Surgery

## 2019-03-04 ENCOUNTER — Other Ambulatory Visit
Admission: RE | Admit: 2019-03-04 | Discharge: 2019-03-04 | Disposition: A | Discharge status: Home / Self Care | Payer: Managed Care, Other (non HMO) | Source: Ambulatory Visit | Attending: Surgery | Admitting: Surgery

## 2019-03-04 ENCOUNTER — Encounter: Payer: Self-pay | Admitting: *Deleted

## 2019-03-04 VITALS — BP 139/90 | HR 114 | Temp 97.9°F | Ht 62.0 in | Wt 185.0 lb

## 2019-03-04 DIAGNOSIS — Z01812 Encounter for preprocedural laboratory examination: Secondary | ICD-10-CM | POA: Insufficient documentation

## 2019-03-04 DIAGNOSIS — L02416 Cutaneous abscess of left lower limb: Secondary | ICD-10-CM | POA: Diagnosis not present

## 2019-03-04 DIAGNOSIS — Z20828 Contact with and (suspected) exposure to other viral communicable diseases: Secondary | ICD-10-CM | POA: Diagnosis not present

## 2019-03-04 LAB — SARS CORONAVIRUS 2 (TAT 6-24 HRS): SARS Coronavirus 2: NEGATIVE

## 2019-03-04 MED ORDER — FLUCONAZOLE 150 MG PO TABS: 150.0000 mg | ORAL_TABLET | Freq: Every day | ORAL | 0 refills | Status: DC

## 2019-03-04 MED ORDER — AMOXICILLIN-POT CLAVULANATE 875-125 MG PO TABS: 1.0000 | ORAL_TABLET | Freq: Two times a day (BID) | ORAL | 0 refills | Status: AC

## 2019-03-04 NOTE — Patient Instructions (Signed)
Incision and drainage left thigh abscess

## 2019-03-04 NOTE — Progress Notes (Signed)
03/04/2019  History of Present Illness: Dawn LenisMyra J Ortiz is a 28 y.o. female s/p I&D of left breast abscess on 05/21/18.  She presents today because she's developing an abscess in the left upper inner thigh.  This started 4 days ago and her PCP gave her dose of antibiotics yesterday but recommended that she be seen by us.  She reports that the area in the thigh has been enlarging and is very tender to touch now.  Denies any drainage at this point.  Denies fevers or chills.  Past Medical History: Past Medical History:  Diagnosis Date  . Anxiety   . Diabetes mellitus without complication (HCC)   . GERD (gastroesophageal reflux disease)      Past Surgical History: Past Surgical History:  Procedure Laterality Date  . INCISION AND DRAINAGE     right breast x1 and left groin x1 and left axilla  . INCISION AND DRAINAGE ABSCESS Left 07/02/2016   Procedure: INCISION AND DRAINAGE ABSCESS;  Surgeon: Lattie Hawichard E Cooper, MD;  Location: [facility];  Service: General;  Laterality: Left;  . INCISION AND DRAINAGE ABSCESS Left 04/18/2017   Procedure: INCISION AND DRAINAGE ABSCESS-LEFT GROIN;  Surgeon: Ancil Linseyavis, [name], MD;  Location: [facility];  Service: General;  Laterality: Left;  . INCISION AND DRAINAGE ABSCESS Right 05/21/2018   Procedure: INCISION AND DRAINAGE ABSCESS- RIGHT THIGH;  Surgeon: Henrene DodgePiscoya, Kylar Leonhardt, MD;  Location: [facility];  Service: General;  Laterality: Right;  . IRRIGATION AND DEBRIDEMENT ABSCESS Right 04/09/2018   Procedure: IRRIGATION AND DEBRIDEMENT ABSCESS;  Surgeon: Henrene DodgePiscoya, Mysty Kielty, MD;  Location: [facility];  Service: General;  Laterality: Right;  . IRRIGATION AND DEBRIDEMENT ABSCESS Left 05/21/2018   Procedure: IRRIGATION AND DEBRIDEMENT BREAST ABSCESS;  Surgeon: Henrene DodgePiscoya, Rim Thatch, MD;  Location: [facility];  Service: General;  Laterality: Left;  . PILONIDAL CYST EXCISION  12/03/2014   Procedure: CYST EXCISION PILONIDAL EXTENSIVE;  Surgeon: Duwaine MaxinWilliam Marterre, MD;  Location: [facility];  Service: General;;     Home Medications: Prior to Admission medications   Medication Sig Start Date End Date Taking? Authorizing Provider  busPIRone (BUSPAR) 10 MG tablet Take 1 tablet (10 mg total) by mouth 2 (two) times daily. 03/03/19  Yes McLean-Scocuzza, Pasty Spillersracy N, MD  etonogestrel (NEXPLANON) 68 MG IMPL implant 1 each by Subdermal route once.   Yes [provider]  insulin aspart protamine- aspart (NOVOLOG MIX 70/30) (70-30) 100 UNIT/ML injection Inject 40-45 Units into the skin 2 (two) times daily with a meal.   Yes [provider]  lisinopril (PRINIVIL,ZESTRIL) 2.5 MG tablet Take 2.5 mg by mouth daily.   Yes [provider]  omeprazole (PRILOSEC) 40 MG capsule Take 1 capsule (40 mg total) by mouth daily. 30 minutes before food 02/03/19  Yes McLean-Scocuzza, Pasty Spillersracy N, MD  ONE TOUCH ULTRA TEST test strip TEST BLOOD SUGAR QID 05/19/18  Yes [provider]  amoxicillin-clavulanate (AUGMENTIN) 875-125 MG tablet Take 1 tablet by mouth 2 (two) times daily for 10 days. 03/04/19 03/14/19  Henrene DodgePiscoya, Keita Demarco, MD  fluconazole (DIFLUCAN) 150 MG tablet Take 1 tablet (150 mg total) by mouth daily. 03/04/19   Henrene DodgePiscoya, Dynasty Holquin, MD  Insulin Degludec (TRESIBA) 100 UNIT/ML SOLN Inject 40 Units into the skin.    [provider]  Semaglutide,0.25 or 0.5MG /DOS, (OZEMPIC, 0.25 OR 0.5 MG/DOSE,) 2 MG/1.5ML SOPN Inject into the skin once a week.    [provider]    Allergies: Allergies  Allergen Reactions  . Sulfa Antibiotics Hives    Review of Systems:  Review of Systems  Constitutional: Negative for chills and fever.  Respiratory: Negative for shortness of breath.   Cardiovascular: Negative for chest pain.  Gastrointestinal: Negative for abdominal pain, nausea and vomiting.  Skin:       Upper inner left thigh abscess    Physical Exam BP 139/90   Pulse (!) 114   Temp 97.9 F (36.6 C) (Skin)   Ht 5\' 2"  (1.575 m)   Wt 185 lb (83.9 kg)   SpO2 98%   BMI 33.84 kg/m   CONSTITUTIONAL: No acute distress HEENT:  Normocephalic, atraumatic, extraocular motion intact. RESPIRATORY:  Lungs are clear, and breath sounds are equal bilaterally. Normal respiratory effort without pathologic use of accessory muscles. CARDIOVASCULAR: Heart is regular without murmurs, gallops, or rubs. GI: The abdomen is soft, nondistended, nontender.  SKIN:  Left upper inner thigh has an area of swelling measuring about 3 x 5 cm, with fluctuance and induration.  There is mild cellulitis and is warm to touch and tender to palpation.  No active drainage. NEUROLOGIC:  Motor and sensation is grossly normal.  Cranial nerves are grossly intact. PSYCH:  Alert and oriented to person, place and time. Affect is normal.  Assessment and Plan: This is a 28 y.o. female with left thigh abscess  Discussed with patient that we can perform I&D of the abscess.  However, she would like to do this in the OR and not in the office.  Given COVID-19 precautions, we will set her up for testing today and schedule her for I&D tomorrow morning as an outpatient procedure in the OR.  Will give her a prescription for Augmentin based on her prior cultures.  Reviewed preop instructions of being NPO after midnight.  Risks of bleeding, infection, injury to surrounding structures, need for further procedures were discussed and she's willing to proceed.  Face-to-face time spent with the patient and care providers was 25 minutes, with more than 50% of the time spent counseling, educating, and coordinating care of the patient.     Melvyn Neth, [facility]

## 2019-03-04 NOTE — H&P (View-Only) (Signed)
03/04/2019  History of Present Illness: Dawn Ortiz is a 27 y.o. female s/p I&D of left breast abscess on 05/21/18.  She presents today because she's developing an abscess in the left upper inner thigh.  This started 4 days ago and her PCP gave her dose of antibiotics yesterday but recommended that she be seen by us.  She reports that the area in the thigh has been enlarging and is very tender to touch now.  Denies any drainage at this point.  Denies fevers or chills.  Past Medical History: Past Medical History:  Diagnosis Date  . Anxiety   . Diabetes mellitus without complication (HCC)   . GERD (gastroesophageal reflux disease)      Past Surgical History: Past Surgical History:  Procedure Laterality Date  . INCISION AND DRAINAGE     right breast x1 and left groin x1 and left axilla  . INCISION AND DRAINAGE ABSCESS Left 07/02/2016   Procedure: INCISION AND DRAINAGE ABSCESS;  Surgeon: [name], MD;  Location: [facility];  Service: General;  Laterality: Left;  . INCISION AND DRAINAGE ABSCESS Left 04/18/2017   Procedure: INCISION AND DRAINAGE ABSCESS-LEFT GROIN;  Surgeon: [name], [name], MD;  Location: [facility];  Service: General;  Laterality: Left;  . INCISION AND DRAINAGE ABSCESS Right 05/21/2018   Procedure: INCISION AND DRAINAGE ABSCESS- RIGHT THIGH;  Surgeon: [name], MD;  Location: [facility];  Service: General;  Laterality: Right;  . IRRIGATION AND DEBRIDEMENT ABSCESS Right 04/09/2018   Procedure: IRRIGATION AND DEBRIDEMENT ABSCESS;  Surgeon: [name], MD;  Location: [facility];  Service: General;  Laterality: Right;  . IRRIGATION AND DEBRIDEMENT ABSCESS Left 05/21/2018   Procedure: IRRIGATION AND DEBRIDEMENT BREAST ABSCESS;  Surgeon: [name], MD;  Location: [facility];  Service: General;  Laterality: Left;  . PILONIDAL CYST EXCISION  12/03/2014   Procedure: CYST EXCISION PILONIDAL EXTENSIVE;  Surgeon: [name], MD;  Location: [facility];  Service: General;;     Home Medications: Prior to Admission medications   Medication Sig Start Date End Date Taking? Authorizing Provider  busPIRone (BUSPAR) 10 MG tablet Take 1 tablet (10 mg total) by mouth 2 (two) times daily. 03/03/19  Yes McLean-Scocuzza, Tracy N, MD  etonogestrel (NEXPLANON) 68 MG IMPL implant 1 each by Subdermal route once.   Yes [provider]  insulin aspart protamine- aspart (NOVOLOG MIX 70/30) (70-30) 100 UNIT/ML injection Inject 40-45 Units into the skin 2 (two) times daily with a meal.   Yes [provider]  lisinopril (PRINIVIL,ZESTRIL) 2.5 MG tablet Take 2.5 mg by mouth daily.   Yes [provider]  omeprazole (PRILOSEC) 40 MG capsule Take 1 capsule (40 mg total) by mouth daily. 30 minutes before food 02/03/19  Yes McLean-Scocuzza, Tracy N, MD  ONE TOUCH ULTRA TEST test strip TEST BLOOD SUGAR QID 05/19/18  Yes [provider]  amoxicillin-clavulanate (AUGMENTIN) 875-125 MG tablet Take 1 tablet by mouth 2 (two) times daily for 10 days. 03/04/19 03/14/19  [name], MD  fluconazole (DIFLUCAN) 150 MG tablet Take 1 tablet (150 mg total) by mouth daily. 03/04/19   [name], MD  Insulin Degludec (TRESIBA) 100 UNIT/ML SOLN Inject 40 Units into the skin.    [provider]  Semaglutide,0.25 or 0.5MG/DOS, (OZEMPIC, 0.25 OR 0.5 MG/DOSE,) 2 MG/1.5ML SOPN Inject into the skin once a week.    [provider]    Allergies: Allergies  Allergen Reactions  . Sulfa Antibiotics Hives    Review of Systems:   Review of Systems  Constitutional: Negative for chills and fever.  Respiratory: Negative for shortness of breath.   Cardiovascular: Negative for chest pain.  Gastrointestinal: Negative for abdominal pain, nausea and vomiting.  Skin:       Upper inner left thigh abscess    Physical Exam BP 139/90   Pulse (!) 114   Temp 97.9 F (36.6 C) (Skin)   Ht 5\' 2"  (1.575 m)   Wt 185 lb (83.9 kg)   SpO2 98%   BMI 33.84 kg/m   CONSTITUTIONAL: No acute distress HEENT:  Normocephalic, atraumatic, extraocular motion intact. RESPIRATORY:  Lungs are clear, and breath sounds are equal bilaterally. Normal respiratory effort without pathologic use of accessory muscles. CARDIOVASCULAR: Heart is regular without murmurs, gallops, or rubs. GI: The abdomen is soft, nondistended, nontender.  SKIN:  Left upper inner thigh has an area of swelling measuring about 3 x 5 cm, with fluctuance and induration.  There is mild cellulitis and is warm to touch and tender to palpation.  No active drainage. NEUROLOGIC:  Motor and sensation is grossly normal.  Cranial nerves are grossly intact. PSYCH:  Alert and oriented to person, place and time. Affect is normal.  Assessment and Plan: This is a 28 y.o. female with left thigh abscess  Discussed with patient that we can perform I&D of the abscess.  However, she would like to do this in the OR and not in the office.  Given COVID-19 precautions, we will set her up for testing today and schedule her for I&D tomorrow morning as an outpatient procedure in the OR.  Will give her a prescription for Augmentin based on her prior cultures.  Reviewed preop instructions of being NPO after midnight.  Risks of bleeding, infection, injury to surrounding structures, need for further procedures were discussed and she's willing to proceed.  Face-to-face time spent with the patient and care providers was 25 minutes, with more than 50% of the time spent counseling, educating, and coordinating care of the patient.     Melvyn Neth, [facility]

## 2019-03-04 NOTE — Telephone Encounter (Signed)
Patient contacted and aware to arrive to [facility] at 9 am tomorrow, 03-05-19.  The patient verbalizes understanding.

## 2019-03-04 NOTE — Progress Notes (Signed)
Patient's surgery to be scheduled for 03-05-19 at [facility] with Dr. Hampton Abbot.   The patient is aware to have COVID-19 testing done on 03-04-19 at the [facility] building drive thru ([address] [place]) as soon as she leaves the office today.   The patient is aware we will call and notify her of arrival time for tomorrow once the O.R. posts (patient will need to arrive 2 hours prior to surgery per Caryl Pina in Pre-admit).   Patient aware to be NPO after midnight and have a driver.   She is aware to check in at the [facility] entrance where she will be screened for the coronavirus and then sent to Same Day Surgery.   Patient aware that she may have one visitor due to COVID-19 restrictions.   The patient verbalizes understanding of the above.   The patient is aware to call the office should she have further questions.

## 2019-03-05 ENCOUNTER — Encounter: Payer: Self-pay | Admitting: Emergency Medicine

## 2019-03-05 ENCOUNTER — Ambulatory Visit: Payer: Managed Care, Other (non HMO) | Admitting: Anesthesiology

## 2019-03-05 ENCOUNTER — Ambulatory Visit
Admission: RE | Admit: 2019-03-05 | Discharge: 2019-03-05 | Disposition: A | Discharge status: Home / Self Care | Payer: Managed Care, Other (non HMO) | Attending: Surgery | Admitting: Surgery

## 2019-03-05 ENCOUNTER — Encounter
Admission: RE | Disposition: A | Discharge status: Home / Self Care | Payer: Self-pay | Source: Home / Self Care | Attending: Surgery

## 2019-03-05 DIAGNOSIS — K219 Gastro-esophageal reflux disease without esophagitis: Secondary | ICD-10-CM | POA: Diagnosis not present

## 2019-03-05 DIAGNOSIS — L02416 Cutaneous abscess of left lower limb: Secondary | ICD-10-CM | POA: Diagnosis present

## 2019-03-05 DIAGNOSIS — F419 Anxiety disorder, unspecified: Secondary | ICD-10-CM | POA: Diagnosis not present

## 2019-03-05 DIAGNOSIS — Z794 Long term (current) use of insulin: Secondary | ICD-10-CM | POA: Insufficient documentation

## 2019-03-05 DIAGNOSIS — Z87891 Personal history of nicotine dependence: Secondary | ICD-10-CM | POA: Insufficient documentation

## 2019-03-05 DIAGNOSIS — Z79899 Other long term (current) drug therapy: Secondary | ICD-10-CM | POA: Insufficient documentation

## 2019-03-05 DIAGNOSIS — E119 Type 2 diabetes mellitus without complications: Secondary | ICD-10-CM | POA: Insufficient documentation

## 2019-03-05 HISTORY — PX: I & D EXTREMITY: SHX5045

## 2019-03-05 LAB — GLUCOSE, CAPILLARY
Glucose-Capillary: 143 mg/dL — ABNORMAL HIGH (ref 70–99)
Glucose-Capillary: 149 mg/dL — ABNORMAL HIGH (ref 70–99)

## 2019-03-05 LAB — POCT PREGNANCY, URINE: Preg Test, Ur: NEGATIVE

## 2019-03-05 SURGERY — IRRIGATION AND DEBRIDEMENT EXTREMITY
Anesthesia: General | Laterality: Left

## 2019-03-05 MED ORDER — IBUPROFEN 600 MG PO TABS: 600.0000 mg | ORAL_TABLET | Freq: Three times a day (TID) | ORAL | 0 refills | Status: DC | PRN

## 2019-03-05 MED ORDER — LIDOCAINE HCL (CARDIAC) PF 100 MG/5ML IV SOSY
PREFILLED_SYRINGE | INTRAVENOUS | Status: DC | PRN
  Administered 2019-03-05: 100 mg via INTRAVENOUS

## 2019-03-05 MED ORDER — ONDANSETRON HCL 4 MG/2ML IJ SOLN
INTRAMUSCULAR | Status: AC
  Filled 2019-03-05: qty 2

## 2019-03-05 MED ORDER — SODIUM CHLORIDE 0.9 % IV SOLN
INTRAVENOUS | Status: DC
  Administered 2019-03-05: 10:00:00 via INTRAVENOUS

## 2019-03-05 MED ORDER — FENTANYL CITRATE (PF) 100 MCG/2ML IJ SOLN
INTRAMUSCULAR | Status: AC
  Filled 2019-03-05: qty 2

## 2019-03-05 MED ORDER — BUPIVACAINE-EPINEPHRINE (PF) 0.5% -1:200000 IJ SOLN
INTRAMUSCULAR | Status: AC
  Filled 2019-03-05: qty 30

## 2019-03-05 MED ORDER — ONDANSETRON HCL 4 MG/2ML IJ SOLN
INTRAMUSCULAR | Status: DC | PRN
  Administered 2019-03-05: 4 mg via INTRAVENOUS

## 2019-03-05 MED ORDER — FAMOTIDINE 20 MG PO TABS
ORAL_TABLET | ORAL | Status: AC
  Filled 2019-03-05: qty 1

## 2019-03-05 MED ORDER — PROPOFOL 10 MG/ML IV BOLUS
INTRAVENOUS | Status: DC | PRN
  Administered 2019-03-05: 160 mg via INTRAVENOUS

## 2019-03-05 MED ORDER — CHLORHEXIDINE GLUCONATE CLOTH 2 % EX PADS: 6.0000 | MEDICATED_PAD | Freq: Once | CUTANEOUS | Status: DC

## 2019-03-05 MED ORDER — CEFAZOLIN SODIUM-DEXTROSE 2-4 GM/100ML-% IV SOLN
2.0000 g | INTRAVENOUS | Status: AC
  Administered 2019-03-05: 2 g via INTRAVENOUS

## 2019-03-05 MED ORDER — PROPOFOL 10 MG/ML IV BOLUS
INTRAVENOUS | Status: AC
  Filled 2019-03-05: qty 20

## 2019-03-05 MED ORDER — BUPIVACAINE LIPOSOME 1.3 % IJ SUSP
INTRAMUSCULAR | Status: AC
  Filled 2019-03-05: qty 20

## 2019-03-05 MED ORDER — LIDOCAINE HCL (PF) 2 % IJ SOLN
INTRAMUSCULAR | Status: AC
  Filled 2019-03-05: qty 10

## 2019-03-05 MED ORDER — MIDAZOLAM HCL 2 MG/2ML IJ SOLN
INTRAMUSCULAR | Status: AC
  Filled 2019-03-05: qty 2

## 2019-03-05 MED ORDER — MIDAZOLAM HCL 2 MG/2ML IJ SOLN
INTRAMUSCULAR | Status: DC | PRN
  Administered 2019-03-05: 2 mg via INTRAVENOUS

## 2019-03-05 MED ORDER — DEXAMETHASONE SODIUM PHOSPHATE 10 MG/ML IJ SOLN
INTRAMUSCULAR | Status: DC | PRN
  Administered 2019-03-05: 10 mg via INTRAVENOUS

## 2019-03-05 MED ORDER — DEXAMETHASONE SODIUM PHOSPHATE 10 MG/ML IJ SOLN
INTRAMUSCULAR | Status: AC
  Filled 2019-03-05: qty 1

## 2019-03-05 MED ORDER — BUPIVACAINE LIPOSOME 1.3 % IJ SUSP
INTRAMUSCULAR | Status: DC | PRN
  Administered 2019-03-05: 30 mL

## 2019-03-05 MED ORDER — LACTATED RINGERS IV SOLN
INTRAVENOUS | Status: DC | PRN
  Administered 2019-03-05: 11:00:00 via INTRAVENOUS

## 2019-03-05 MED ORDER — FENTANYL CITRATE (PF) 100 MCG/2ML IJ SOLN: 25.0000 ug | INTRAMUSCULAR | Status: DC | PRN

## 2019-03-05 MED ORDER — BUPIVACAINE LIPOSOME 1.3 % IJ SUSP: 20.0000 mL | Freq: Once | INTRAMUSCULAR | Status: DC

## 2019-03-05 MED ORDER — GABAPENTIN 300 MG PO CAPS
ORAL_CAPSULE | ORAL | Status: AC
  Filled 2019-03-05: qty 1

## 2019-03-05 MED ORDER — FAMOTIDINE 20 MG PO TABS
20.0000 mg | ORAL_TABLET | Freq: Once | ORAL | Status: AC
  Administered 2019-03-05: 20 mg via ORAL

## 2019-03-05 MED ORDER — SODIUM CHLORIDE FLUSH 0.9 % IV SOLN
INTRAVENOUS | Status: AC
  Filled 2019-03-05: qty 40

## 2019-03-05 MED ORDER — CEFAZOLIN SODIUM-DEXTROSE 2-4 GM/100ML-% IV SOLN
INTRAVENOUS | Status: AC
  Filled 2019-03-05: qty 100

## 2019-03-05 MED ORDER — GABAPENTIN 300 MG PO CAPS
300.0000 mg | ORAL_CAPSULE | ORAL | Status: AC
  Administered 2019-03-05: 300 mg via ORAL

## 2019-03-05 MED ORDER — BUPIVACAINE-EPINEPHRINE (PF) 0.25% -1:200000 IJ SOLN
INTRAMUSCULAR | Status: AC
  Filled 2019-03-05: qty 30

## 2019-03-05 MED ORDER — ACETAMINOPHEN 500 MG PO TABS
ORAL_TABLET | ORAL | Status: AC
  Filled 2019-03-05: qty 2

## 2019-03-05 MED ORDER — ONDANSETRON HCL 4 MG/2ML IJ SOLN: 4.0000 mg | Freq: Once | INTRAMUSCULAR | Status: DC | PRN

## 2019-03-05 MED ORDER — OXYCODONE HCL 5 MG PO TABS: 5.0000 mg | ORAL_TABLET | ORAL | 0 refills | Status: DC | PRN

## 2019-03-05 MED ORDER — ACETAMINOPHEN 500 MG PO TABS
1000.0000 mg | ORAL_TABLET | ORAL | Status: AC
  Administered 2019-03-05: 1000 mg via ORAL

## 2019-03-05 MED ORDER — FENTANYL CITRATE (PF) 100 MCG/2ML IJ SOLN
INTRAMUSCULAR | Status: DC | PRN
  Administered 2019-03-05: 100 ug via INTRAVENOUS
  Administered 2019-03-05 (×2): 50 ug via INTRAVENOUS

## 2019-03-05 SURGICAL SUPPLY — 26 items
BNDG GAUZE 4.5X4.1 6PLY STRL (MISCELLANEOUS) ×2 IMPLANT
CHLORAPREP W/TINT 26 (MISCELLANEOUS) ×2 IMPLANT
COVER WAND RF STERILE (DRAPES) ×2 IMPLANT
DRAIN PENROSE 1/4X12 LTX (DRAIN) ×2 IMPLANT
DRAPE 3/4 80X56 (DRAPES) ×4 IMPLANT
DRAPE LAPAROTOMY 100X77 ABD (DRAPES) ×2 IMPLANT
ELECT REM PT RETURN 9FT ADLT (ELECTROSURGICAL) ×2
ELECTRODE REM PT RTRN 9FT ADLT (ELECTROSURGICAL) ×1 IMPLANT
GAUZE SPONGE 4X4 12PLY STRL (GAUZE/BANDAGES/DRESSINGS) ×4 IMPLANT
GLOVE SURG SYN 7.0 (GLOVE) ×2 IMPLANT
GLOVE SURG SYN 7.5  E (GLOVE) ×1
GLOVE SURG SYN 7.5 E (GLOVE) ×1 IMPLANT
GOWN STRL REUS W/ TWL LRG LVL3 (GOWN DISPOSABLE) ×3 IMPLANT
GOWN STRL REUS W/TWL LRG LVL3 (GOWN DISPOSABLE) ×3
KIT TURNOVER KIT A (KITS) ×2 IMPLANT
LABEL OR SOLS (LABEL) IMPLANT
NEEDLE HYPO 22GX1.5 SAFETY (NEEDLE) ×2 IMPLANT
NS IRRIG 500ML POUR BTL (IV SOLUTION) ×2 IMPLANT
PACK BASIN MINOR ARMC (MISCELLANEOUS) ×2 IMPLANT
PAD ABD DERMACEA PRESS 5X9 (GAUZE/BANDAGES/DRESSINGS) ×2 IMPLANT
SUT SILK 0 (SUTURE) ×1
SUT SILK 0 30XBRD TIE 6 (SUTURE) ×1 IMPLANT
SWAB CULTURE AMIES ANAERIB BLU (MISCELLANEOUS) ×2 IMPLANT
SYR 10ML LL (SYRINGE) ×2 IMPLANT
SYR 30ML LL (SYRINGE) ×2 IMPLANT
SYR BULB IRRIG 60ML STRL (SYRINGE) ×2 IMPLANT

## 2019-03-05 NOTE — Anesthesia Post-op Follow-up Note (Signed)
Anesthesia QCDR form completed.        

## 2019-03-05 NOTE — Anesthesia Procedure Notes (Signed)
Procedure Name: LMA Insertion Performed by: Philbert Riser, CRNA Pre-anesthesia Checklist: Patient identified, Emergency Drugs available, Suction available, Patient being monitored and Timeout performed Patient Re-evaluated:Patient Re-evaluated prior to induction Oxygen Delivery Method: Circle system utilized and Simple face mask Preoxygenation: Pre-oxygenation with 100% oxygen Induction Type: IV induction Ventilation: Mask ventilation without difficulty LMA: LMA inserted LMA Size: 4.0

## 2019-03-05 NOTE — Anesthesia Postprocedure Evaluation (Signed)
Anesthesia Post Note  Patient: Dawn Ortiz  Procedure(s) Performed: MINOR INCISION AND DRAINAGE OF LEFT UPPER THIGH ABSCESS,  DIABETIC (Left )  Patient location during evaluation: PACU Anesthesia Type: General Level of consciousness: awake and alert Pain management: pain level controlled Vital Signs Assessment: post-procedure vital signs reviewed and stable Respiratory status: spontaneous breathing and respiratory function stable Cardiovascular status: stable Anesthetic complications: no     Last Vitals:  Vitals:   03/05/19 1256 03/05/19 1306  BP: 122/81   Pulse:  (!) 107  Resp: 17 (!) 21  Temp:  36.4 C  SpO2: 99% 100%    Last Pain:  Vitals:   03/05/19 1306  TempSrc:   PainSc: 0-No pain                 KEPHART,WILLIAM K

## 2019-03-05 NOTE — Interval H&P Note (Signed)
History and Physical Interval Note:  03/05/2019 10:48 AM  Dawn Ortiz  has presented today for surgery, with the diagnosis of L02.416 LEFT THIGH ABSCESS.  The various methods of treatment have been discussed with the patient and family. After consideration of risks, benefits and other options for treatment, the patient has consented to  Procedure(s): MINOR INCISION AND DRAINAGE OF LEFT UPPER THIGH ABSCESS,  DIABETIC (Left) as a surgical intervention.  The patient's history has been reviewed, patient examined, no change in status, stable for surgery.  I have reviewed the patient's chart and labs.  Questions were answered to the patient's satisfaction.     Sy Saintjean

## 2019-03-05 NOTE — Discharge Instructions (Signed)

## 2019-03-05 NOTE — Transfer of Care (Signed)
Immediate Anesthesia Transfer of Care Note  Patient: Dawn Ortiz  Procedure(s) Performed: MINOR INCISION AND DRAINAGE OF LEFT UPPER THIGH ABSCESS,  DIABETIC (Left )  Patient Location: PACU  Anesthesia Type:General  Level of Consciousness: sedated  Airway & Oxygen Therapy: Patient Spontanous Breathing and Patient connected to face mask oxygen  Post-op Assessment: Report given to RN and Post -op Vital signs reviewed and stable  Post vital signs: Reviewed and stable  Last Vitals:  Vitals Value Taken Time  BP 121/63 03/05/19 1226  Temp    Pulse 110 03/05/19 1227  Resp 14 03/05/19 1227  SpO2 100 % 03/05/19 1227  Vitals shown include unvalidated device data.  Last Pain:  Vitals:   03/05/19 0914  TempSrc: Temporal  PainSc: 6          Complications: No apparent anesthesia complications

## 2019-03-05 NOTE — Anesthesia Preprocedure Evaluation (Signed)
Anesthesia Evaluation  Patient identified by MRN, date of birth, ID band Patient awake    Reviewed: Allergy & Precautions, NPO status , Patient's Chart, lab work & pertinent test results  History of Anesthesia Complications Negative for: history of anesthetic complications  Airway Mallampati: II       Dental   Pulmonary neg sleep apnea, neg COPD, Not current smoker, former smoker,           Cardiovascular (-) hypertension(-) Past MI and (-) CHF (-) dysrhythmias (-) Valvular Problems/Murmurs     Neuro/Psych neg Seizures Anxiety    GI/Hepatic Neg liver ROS, GERD  Medicated and Controlled,  Endo/Other  diabetes, Type 1, Insulin Dependent  Renal/GU negative Renal ROS     Musculoskeletal   Abdominal   Peds  Hematology   Anesthesia Other Findings   Reproductive/Obstetrics                             Anesthesia Physical Anesthesia Plan  ASA: III  Anesthesia Plan: General   Post-op Pain Management:    Induction: Intravenous  PONV Risk Score and Plan: 3 and Ondansetron, Midazolam and Treatment may vary due to age or medical condition  Airway Management Planned: LMA  Additional Equipment:   Intra-op Plan:   Post-operative Plan:   Informed Consent: I have reviewed the patients History and Physical, chart, labs and discussed the procedure including the risks, benefits and alternatives for the proposed anesthesia with the patient or authorized representative who has indicated his/her understanding and acceptance.       Plan Discussed with:   Anesthesia Plan Comments:         Anesthesia Quick Evaluation

## 2019-03-05 NOTE — Op Note (Signed)
  Procedure Date:  03/05/2019  Pre-operative Diagnosis:  Left upper medial thigh abscess  Post-operative Diagnosis:  Left upper medial thigh abscess  Procedure:  Incision and Drainage of left upper medial thigh abscess  Surgeon:  Melvyn Neth, MD  Assistant:  Edison Simon, PA-C  Anesthesia:  General endotracheal  Estimated Blood Loss:  5 ml  Specimens:  Culture swab  Complications:  None  Indications for Procedure:  This is a 28 y.o. female with diagnosis of left upper medial thigh abscess abscess, requiring drainage procedure.  The risks of bleeding, abscess or infection, injury to surrounding structures, and need for further procedures were all discussed with the patient and was willing to proceed.  Description of Procedure: The patient was correctly identified in the preoperative area and brought into the operating room.  The patient was placed supine with VTE prophylaxis in place.  Appropriate time-outs were performed.  Anesthesia was induced and the patient was intubated.  Appropriate antibiotics were infused.  The patient's left upper thigh and groin were prepped and draped in usual sterile fashion.  Using an 18 gauze needle the most dependent portion of the abscess was determined and a 1.5 cm incision was made over the this point, revealing purulent fluid.  This fluid was swabbed for culture and sent to micro.  Small Kelly forceps were used to dissect around the abscess tissue to open any remaining pockets of purulent fluid, as well as determine the extent of the abscess.  The abscess cavity overall measured approximately 5 cm.  A 1.5 cm counter incision was made at the most superior and anterior portion of the abscess, and a 1/4 inch penrose drain was looped around the two incisions and tied with 0 Silk ties.  After drainage was completed, the cavity was irrigated and cleaned.  Local anesthetic was infused intradermally.  The wound was dressed with 4x4 gauze, ABD pad and  tape.  The patient was emerged from anesthesia, extubated, and brought to the recovery room for further management.    The patient tolerated the procedure well and all counts were correct at the end of the case.   Melvyn Neth, MD

## 2019-03-06 ENCOUNTER — Encounter: Payer: Self-pay | Admitting: Surgery

## 2019-03-09 ENCOUNTER — Encounter: Payer: Self-pay | Admitting: Surgery

## 2019-03-10 ENCOUNTER — Encounter: Payer: Self-pay | Admitting: Physician Assistant

## 2019-03-10 ENCOUNTER — Ambulatory Visit (INDEPENDENT_AMBULATORY_CARE_PROVIDER_SITE_OTHER): Payer: Managed Care, Other (non HMO) | Admitting: Physician Assistant

## 2019-03-10 ENCOUNTER — Other Ambulatory Visit: Payer: Self-pay

## 2019-03-10 VITALS — BP 137/89 | HR 101 | Temp 97.7°F | Wt 184.0 lb

## 2019-03-10 DIAGNOSIS — L02416 Cutaneous abscess of left lower limb: Secondary | ICD-10-CM

## 2019-03-10 DIAGNOSIS — Z09 Encounter for follow-up examination after completed treatment for conditions other than malignant neoplasm: Secondary | ICD-10-CM

## 2019-03-10 LAB — AEROBIC/ANAEROBIC CULTURE W GRAM STAIN (SURGICAL/DEEP WOUND)

## 2019-03-10 NOTE — Patient Instructions (Signed)
Keep area clean The patient is aware to call back for any questions or new concerns.

## 2019-03-10 NOTE — Progress Notes (Signed)
[facility] POST-OP OFFICE VISIT  03/10/2019  HPI: Dawn Ortiz is a 28 y.o. female 6 days s/p incision and drainage for left medial thigh abscess. Overall, she reports that she is doing well. No fever, chills, nausea, or emesis. Still taking her Augmentin. She did report some pain in the medial portion of her incision. Drainage has slowed down and is described as what sound serosanguinous. Pain controlled primarily with motrin.   Vital signs: BP 137/89   Pulse (!) 101   Temp 97.7 F (36.5 C)   Wt 184 lb (83.5 kg)   SpO2 98%   BMI 33.65 kg/m    Physical Exam: Constitutional: Well appearing female, NAD Skin: Two 2 cm incisions to the medial thigh, tenderness to palpation in the middle portion of the wound which I suspect is irritation from the penrose drain, no erythema, no purulent drainage, previous swelling has resolved. Penrose removed and no expressible drainage from the wound.   Assessment/Plan: This is a 28 y.o. female overall doing well 6 days s/p incision and drainage for left medial thigh abscess   - Penrose removed at bedside, dry dressing placed  - continue dry dressing changes daily  - complete course of ABx  - motrin/tylenol for pain control prn  - follow up final cultures   - monitor for signs/symptoms of recurring abscess/infection   - RTC in 2 weeks for wound check  -- Edison Simon, PA-C [facility] 03/10/2019, 11:19 AM [phone] M-F: 7am - 4pm

## 2019-03-11 ENCOUNTER — Other Ambulatory Visit: Payer: Self-pay

## 2019-03-11 ENCOUNTER — Ambulatory Visit
Admission: RE | Admit: 2019-03-11 | Discharge: 2019-03-11 | Disposition: A | Discharge status: Home / Self Care | Payer: Managed Care, Other (non HMO) | Source: Ambulatory Visit | Attending: Internal Medicine | Admitting: Internal Medicine

## 2019-03-11 DIAGNOSIS — R7989 Other specified abnormal findings of blood chemistry: Secondary | ICD-10-CM

## 2019-03-11 DIAGNOSIS — R945 Abnormal results of liver function studies: Secondary | ICD-10-CM | POA: Diagnosis present

## 2019-03-13 ENCOUNTER — Encounter: Payer: Managed Care, Other (non HMO) | Admitting: Surgery

## 2019-03-17 ENCOUNTER — Encounter: Payer: Self-pay | Admitting: Internal Medicine

## 2019-03-17 ENCOUNTER — Other Ambulatory Visit: Payer: Self-pay | Admitting: Internal Medicine

## 2019-03-17 DIAGNOSIS — B3731 Acute candidiasis of vulva and vagina: Secondary | ICD-10-CM

## 2019-03-17 DIAGNOSIS — B373 Candidiasis of vulva and vagina: Secondary | ICD-10-CM

## 2019-03-17 MED ORDER — FLUCONAZOLE 150 MG PO TABS: 150.0000 mg | ORAL_TABLET | Freq: Every day | ORAL | 0 refills | Status: DC

## 2019-03-20 ENCOUNTER — Other Ambulatory Visit: Payer: Self-pay

## 2019-03-20 ENCOUNTER — Encounter: Payer: Self-pay | Admitting: Internal Medicine

## 2019-03-20 ENCOUNTER — Ambulatory Visit (INDEPENDENT_AMBULATORY_CARE_PROVIDER_SITE_OTHER): Payer: Managed Care, Other (non HMO) | Admitting: Physician Assistant

## 2019-03-20 ENCOUNTER — Encounter: Payer: Self-pay | Admitting: Physician Assistant

## 2019-03-20 VITALS — BP 131/84 | HR 118 | Temp 97.8°F | Ht 62.0 in | Wt 179.0 lb

## 2019-03-20 DIAGNOSIS — L02412 Cutaneous abscess of left axilla: Secondary | ICD-10-CM | POA: Diagnosis not present

## 2019-03-20 DIAGNOSIS — Z09 Encounter for follow-up examination after completed treatment for conditions other than malignant neoplasm: Secondary | ICD-10-CM | POA: Diagnosis not present

## 2019-03-20 DIAGNOSIS — L02416 Cutaneous abscess of left lower limb: Secondary | ICD-10-CM | POA: Diagnosis not present

## 2019-03-20 DIAGNOSIS — B3731 Acute candidiasis of vulva and vagina: Secondary | ICD-10-CM

## 2019-03-20 DIAGNOSIS — B373 Candidiasis of vulva and vagina: Secondary | ICD-10-CM

## 2019-03-20 MED ORDER — FLUCONAZOLE 150 MG PO TABS: 150.0000 mg | ORAL_TABLET | Freq: Every day | ORAL | 0 refills | Status: DC

## 2019-03-20 MED ORDER — CLINDAMYCIN HCL 150 MG PO CAPS: 150.0000 mg | ORAL_CAPSULE | Freq: Four times a day (QID) | ORAL | 0 refills | Status: AC

## 2019-03-20 NOTE — Addendum Note (Signed)
Addended by: Edison Simon R on: 03/20/2019 11:04 AM   Modules accepted: Orders

## 2019-03-20 NOTE — Progress Notes (Signed)
Procedure Note  Procedure Date:  03/20/2019  Pre-operative Diagnosis:  Left Axillary Abscess  Post-operative Diagnosis: Left Axillary Abscess  Procedure:  Incision and Drainage of Left Axillary Abscess (cpt: 10060)  Preforming Provider: Edison Simon PA-C  Authorizing Provider: Nestor Lewandowsky, MD  Anesthesia:  20 ccs of 1% Lidocaine with Epinephrine  Estimated Blood Loss:  1 ml  Specimens:  Purulent Fluid Cultured  Complications:  None  Indications for Procedure:  This is a 28 y.o. female with diagnosis of left axilla abscess, requiring drainage procedure.  The risks of bleeding, abscess or infection, injury to surrounding structures, and need for further procedures were all discussed with the patient and was willing to proceed.  Description of Procedure: The patient was correctly identified at bedside.  Appropriate time-outs were performed prior to procedure.  The patient's left axilla was prepped and draped in usual sterile fashion.  Local anesthetic was infused intradermally.  A 2 incision was made with 11 blade scalpel over the abscess, revealing purulent fluid.  This fluid was swabbed for culture and sent to micro.  Small Kelly forceps were used to dissect around the abscess tissue to open any remaining pockets of purulent fluid.  After drainage was completed, the cavity was irrigated and cleaned. The wound was packed with 1/4" gauze and covered with dry gauze and tape.  The patient tolerated the procedure well and all sharps were appropriately disposed of at the end of the case.  -- Edison Simon, PA-C [facility] 03/20/2019, 11:13 AM [phone] M-F: 7am - 4pm

## 2019-03-20 NOTE — Addendum Note (Signed)
Addended by: Chrystie Nose on: 03/20/2019 11:17 AM   Modules accepted: Orders

## 2019-03-20 NOTE — Patient Instructions (Signed)
Pack the area daily. Return in one week.

## 2019-03-20 NOTE — Addendum Note (Signed)
Addended by: Edison Simon R on: 03/20/2019 11:16 AM   Modules accepted: Orders

## 2019-03-20 NOTE — Progress Notes (Signed)
[facility] POST-OP OFFICE VISIT  03/20/2019  HPI: Dawn Ortiz is a 28 y.o. female 2 weeks s/p incision and drainage of left medial thigh abscess. She reports that her left thigh abscess is healing well, no drainage, minimal soreness intermittently.   However, she has since developed an area of swelling and tenderness in her left axilla over the last few days despite being on Augmentin. After she completed her ABx course this area in her left axilla worsened. No fever or chills. No drainage. This is similar to her presentation for recurrent abscesses.   Vital signs: BP 131/84   Pulse (!) 118   Temp 97.8 F (36.6 C) (Skin)   Ht 5\' 2"  (1.575 m)   Wt 179 lb (81.2 kg)   SpO2 98%   BMI 32.74 kg/m    Physical Exam: Constitutional: Well appearing female, NAD Skin:     - Left Groin: Previous I&D site is well healed, non-tender, no fluctuance    - Left Axilla: There is an area of fluctuance and tenderness to the left axilla concerning for recurrence of abscess  Assessment/Plan: This is a 28 y.o. female 2 weeks s/p incision and drainage of left medial thigh abscess who has subsequently developed a new left axillary abscess   - Will plan on I&D in the office today with Dr Hampton Abbot, consent obtained, refer to procedure note for details  - Will start on 7 days of clindamycin  - Plan for wound packing daily and dry dressings over top  - Given recurrence there is concern that this is related to possible hidradenitis, will refer to dermatology to assist in management of this  - RTC in ~1 week to reassess  Above plan was d/w Dr Hampton Abbot who also saw the patient.   -- Edison Simon, PA-C [facility] 03/20/2019, 9:36 AM [phone] M-F: 7am - 4pm

## 2019-03-24 ENCOUNTER — Telehealth: Payer: Self-pay

## 2019-03-24 ENCOUNTER — Other Ambulatory Visit: Payer: Self-pay | Admitting: Physician Assistant

## 2019-03-24 LAB — ANAEROBIC AND AEROBIC CULTURE

## 2019-03-24 MED ORDER — CIPROFLOXACIN HCL 500 MG PO TABS: 500.0000 mg | ORAL_TABLET | Freq: Two times a day (BID) | ORAL | 0 refills | Status: AC

## 2019-03-24 NOTE — Telephone Encounter (Signed)
Per Loel Dubonnet PA the culture came back and he will need to have the patient start a different antibiotic as the bacteria is not sensitive to the Clindamycin that she is on.  Spoke with the patient and notified her that we have sent her in Cipro for her to start today and to discontinue her previous antibiotic.

## 2019-03-24 NOTE — Progress Notes (Signed)
Reviewed culture results from left axilla abscess I&D on 08/21. This grew out E coli. Susceptibilities reviewed.   Will stop clindamycin and prescribe 500 mg Ciprofloxacin for 10 day course to appropriately manage infection.  Will call and update patient. She has follow up scheduled later this week.   Edison Simon, PA-C [facility] 03/24/2019, 1:54 PM [phone] M-F: 7am - 4pm

## 2019-03-27 ENCOUNTER — Encounter: Payer: Managed Care, Other (non HMO) | Admitting: Physician Assistant

## 2019-03-31 ENCOUNTER — Encounter: Payer: Managed Care, Other (non HMO) | Admitting: Physician Assistant

## 2019-04-14 ENCOUNTER — Encounter: Payer: Managed Care, Other (non HMO) | Admitting: Surgery

## 2019-04-26 ENCOUNTER — Encounter: Payer: Self-pay | Admitting: Internal Medicine

## 2019-04-29 ENCOUNTER — Other Ambulatory Visit: Payer: Self-pay | Admitting: Internal Medicine

## 2019-04-29 DIAGNOSIS — F419 Anxiety disorder, unspecified: Secondary | ICD-10-CM

## 2019-04-29 DIAGNOSIS — G43909 Migraine, unspecified, not intractable, without status migrainosus: Secondary | ICD-10-CM | POA: Insufficient documentation

## 2019-05-18 ENCOUNTER — Encounter: Payer: Self-pay | Admitting: Psychiatry

## 2019-05-18 ENCOUNTER — Ambulatory Visit (INDEPENDENT_AMBULATORY_CARE_PROVIDER_SITE_OTHER): Payer: 59 | Admitting: Psychiatry

## 2019-05-18 ENCOUNTER — Other Ambulatory Visit: Payer: Self-pay

## 2019-05-18 DIAGNOSIS — F418 Other specified anxiety disorders: Secondary | ICD-10-CM | POA: Diagnosis not present

## 2019-05-18 MED ORDER — LORAZEPAM 0.5 MG PO TABS: 0.5000 mg | ORAL_TABLET | Freq: Two times a day (BID) | ORAL | 1 refills | Status: DC | PRN

## 2019-05-18 MED ORDER — SERTRALINE HCL 50 MG PO TABS: 50.0000 mg | ORAL_TABLET | Freq: Every day | ORAL | 1 refills | Status: DC

## 2019-05-18 NOTE — Progress Notes (Signed)
Psychiatric Initial Adult Assessment   I connected with  Dawn Ortiz on 05/18/19 by a video enabled telemedicine application and verified that I am speaking with the correct person using two identifiers.   I discussed the limitations of evaluation and management by telemedicine. The patient expressed understanding and agreed to proceed.   Patient Identification: Dawn Ortiz MRN:  [id number] Date of Evaluation:  05/18/2019 Referral Source: PCP, Dr. Judie Grieve  Chief Complaint:   " My anxiety and depression are getting worse."  Visit Diagnosis: Depression with anxiety   History of Present Illness:  This is a 28 year old female with hx of anxiety and type 2 Diabetes mellitus now seen after being referred by her PCP. Pt reported a long hx of depression and anxiety since late teens. She was managed with a combination of Sertraline and Lorazepam when she was in college with good response, however, she has always had a hard time taking her medications so she discontinued taking them. For the past year or so, she has began to have more sad days with crying spells. She reported feeling helpless with anhedonia. Pt reported low energy levels, poor appetite, poor concentration and poor sleep. She denied any passive suicidal ideations. Denied any prior suicide attempts. Denied any hx of non suicidal self injurious behaviors. She also reported feeling very anxious with heart racing and sweating at times. She reported that her anxiety causes her to have migraine headaches and she is struggling in school classes as well as at work. 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  Pt denied any symptoms suggestive of hypomania, mania, psychosis or PTSD.   Associated Signs/Symptoms: Depression Symptoms:  See HPI (Hypo) Manic Symptoms:  Denied Anxiety Symptoms:  See HPI Psychotic Symptoms:  Denied PTSD Symptoms: Denied  Past Psychiatric History: anxiety,  depression  Previous Psychotropic Medications: Yes , paxil- with no benefit, Sertraline in college with good effect  Substance Abuse History in the last 12 months:  No.Denied any use of alcohol and illicit substances. She denied misusing lorazepam in the past and informed she only too it as needed.  Consequences of Substance Abuse: Negative  Past Medical History:  Past Medical History:  Diagnosis Date  . Anxiety   . Diabetes mellitus without complication (HCC)   . GERD (gastroesophageal reflux disease)     Past Surgical History:  Procedure Laterality Date  . I&D EXTREMITY Left 03/05/2019   Procedure: Irrigation and debridement;  Surgeon: Henrene Dodge, MD;  Location: [facility];  Service: General;  Laterality: Left;  . INCISION AND DRAINAGE     right breast x1 and left groin x1 and left axilla  . INCISION AND DRAINAGE ABSCESS Left 07/02/2016   Procedure: INCISION AND DRAINAGE ABSCESS;  Surgeon: Lattie Haw, MD;  Location: [facility];  Service: General;  Laterality: Left;  . INCISION AND DRAINAGE ABSCESS Left 04/18/2017   Procedure: INCISION AND DRAINAGE ABSCESS-LEFT GROIN;  Surgeon: Ancil Linsey, MD;  Location: [facility];  Service: General;  Laterality: Left;  . INCISION AND DRAINAGE ABSCESS Right 05/21/2018   Procedure: INCISION AND DRAINAGE ABSCESS- RIGHT THIGH;  Surgeon: Henrene Dodge, MD;  Location: [facility];  Service: General;  Laterality: Right;  . IRRIGATION AND DEBRIDEMENT ABSCESS Right 04/09/2018   Procedure: IRRIGATION AND DEBRIDEMENT ABSCESS;  Surgeon: Henrene Dodge, MD;  Location: [facility];  Service: General;  Laterality: Right;  . IRRIGATION AND DEBRIDEMENT ABSCESS Left 05/21/2018   Procedure: IRRIGATION AND DEBRIDEMENT BREAST  ABSCESS;  Surgeon: Henrene DodgePiscoya, Jose, MD;  Location: [facility];  Service: General;  Laterality: Left;  . PILONIDAL CYST EXCISION  12/03/2014   Procedure: CYST EXCISION PILONIDAL EXTENSIVE;  Surgeon: Duwaine MaxinWilliam Marterre, MD;  Location: [facility];  Service:  General;;    Family Psychiatric History: denied  Family History:  Family History  Problem Relation Age of Onset  . Healthy Mother   . Hypertension Father   . Diabetes Father   . Heart disease Father   . Sarcoidosis Father   . Healthy Brother   . Obesity Brother   . Cancer Paternal Grandfather        ?prostate   . Hypertension Other     Social History:   Social History   Socioeconomic History  . Marital status: Single    Spouse name: Not on file  . Number of children: Not on file  . Years of education: Not on file  . Highest education level: Not on file  Occupational History  . Not on file  Social Needs  . Financial resource strain: Not on file  . Food insecurity    Worry: Not on file    Inability: Not on file  . Transportation needs    Medical: Not on file    Non-medical: Not on file  Tobacco Use  . Smoking status: Former Smoker    Types: Cigarettes  . Smokeless tobacco: Never Used  Substance and Sexual Activity  . Alcohol use: Yes    Alcohol/week: 1.0 - 2.0 standard drinks    Types: 1 - 2 Glasses of wine per week    Comment: ocassional  . Drug use: No  . Sexual activity: Yes    Birth control/protection: Implant  Lifestyle  . Physical activity    Days per week: Not on file    Minutes per session: Not on file  . Stress: Not on file  Relationships  . Social Musicianconnections    Talks on phone: Not on file    Gets together: Not on file    Attends religious service: Not on file    Active member of club or organization: Not on file    Attends meetings of clubs or organizations: Not on file    Relationship status: Not on file  Other Topics Concern  . 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 Sulfa  Antibiotics Hives    Metabolic Disorder Labs: Lab Results  Component Value Date   HGBA1C 10.0 (H) 02/26/2019   No results found for: PROLACTIN Lab Results  Component Value Date   CHOL 127 02/26/2019   TRIG 96 02/26/2019   HDL 37 (L) 02/26/2019   CHOLHDL 3.4 02/26/2019   LDLCALC 71 02/26/2019   LDLCALC 94 10/21/2017   Lab Results  Component Value Date   TSH 0.773 02/26/2019    Therapeutic Level Labs: No results found for: LITHIUM No results found for: CBMZ No results found for: VALPROATE  Current Medications: Current Outpatient Medications  Medication Sig Dispense Refill  . busPIRone (BUSPAR) 10 MG tablet Take 1 tablet (10 mg total) by mouth 2 (two) times daily. 180 tablet 3  . etonogestrel (NEXPLANON) 68 MG IMPL implant 1 each  by Subdermal route once.    . fluconazole (DIFLUCAN) 150 MG tablet Take 1 tablet (150 mg total) by mouth daily. Repeat in 3 days prn 5 tablet 0  . ibuprofen (ADVIL) 600 MG tablet Take 1 tablet (600 mg total) by mouth every 8 (eight) hours as needed. 30 tablet 0  . insulin aspart protamine- aspart (NOVOLOG MIX 70/30) (70-30) 100 UNIT/ML injection Inject 40-45 Units into the skin 2 (two) times daily with a meal.    . Insulin Degludec (TRESIBA) 100 UNIT/ML SOLN Inject 40 Units into the skin.    Marland Kitchen lisinopril (PRINIVIL,ZESTRIL) 2.5 MG tablet Take 2.5 mg by mouth daily.    Marland Kitchen omeprazole (PRILOSEC) 40 MG capsule Take 1 capsule (40 mg total) by mouth daily. 30 minutes before food 60 capsule 2  . ONE TOUCH ULTRA TEST test strip TEST BLOOD SUGAR QID  0  . oxyCODONE (OXY IR/ROXICODONE) 5 MG immediate release tablet Take 1 tablet (5 mg total) by mouth every 4 (four) hours as needed for severe pain. 20 tablet 0  . Semaglutide,0.25 or 0.5MG /DOS, (OZEMPIC, 0.25 OR 0.5 MG/DOSE,) 2 MG/1.5ML SOPN Inject into the skin once a week.     No current facility-administered medications for this visit.     Musculoskeletal: Strength & Muscle Tone: Unable to assess due to  telemed visit [place]: Unable to assess due to telemed visit Patient leans: Unable to assess due to telemed visit  Psychiatric Specialty Exam: ROS  There were no vitals taken for this visit.There is no height or weight on file to calculate BMI.  General Appearance: Well Groomed  Eye Contact:  Good  Speech:  Clear and Coherent and Normal Rate  Volume:  Normal  Mood:  Anxious and Depressed  Affect:  Congruent  Thought Process:  Goal Directed, Linear and Descriptions of Associations: Intact  Orientation:  Full (Time, Place, and Person)  Thought Content:  Logical  Suicidal Thoughts:  No  Homicidal Thoughts:  No  Memory:  Recent;   Good Remote;   Good  Judgement:  Fair  Insight:  Fair  Psychomotor Activity:  Normal  Concentration:  Concentration: Fair and Attention Span: Good  Recall:  Good  Fund of Knowledge:Good  Language: Good  Akathisia:  Negative  Handed:  Right  AIMS (if indicated):  Not indicated  Assets:  Communication Skills Desire for Improvement Financial Resources/Insurance [place] Talents/Skills Transportation  ADL's:  Intact  Cognition: WNL  Sleep:  Poor     Assessment and Plan: 28 year old female with hx of anxiety now seen for escalating anxiety and depression symptoms. She reported good response to Sertraline and Ativan in the past. On the basis of escalating symptoms, will restart the same regimen with plan for short term course of benzodiazepine. Potential side effects of medication and risks vs benefits of treatment vs non-treatment were explained and discussed. All questions were answered.   1. Depression with anxiety  - Restart sertraline (ZOLOFT) 50 MG tablet; Take 1 tablet (50 mg total) by mouth daily.  Dispense: 30 tablet; Refill: 1 - restart LORazepam (ATIVAN) 0.5 MG tablet; Take 1 tablet (0.5 mg total) by mouth 2 (two) times daily as needed for anxiety.  Dispense: 45 tablet; Refill: 1   F/up in 1  month.   [place] Crane, MD 10/19/20208:53 AM

## 2019-05-20 ENCOUNTER — Ambulatory Visit: Payer: Managed Care, Other (non HMO) | Admitting: Internal Medicine

## 2019-05-29 ENCOUNTER — Telehealth: Payer: Self-pay

## 2019-05-29 MED ORDER — BUPROPION HCL ER (XL) 150 MG PO TB24: 150.0000 mg | ORAL_TABLET | ORAL | 1 refills | Status: DC

## 2019-05-29 NOTE — Telephone Encounter (Signed)
pt called states she thought about it and she wold like to try the new medication that you disscuss with her.

## 2019-05-29 NOTE — Telephone Encounter (Signed)
Okay, we can add Wellbutrin XL 150 mg qam to her current regimen. So, tell her to keep taking her current combination and we will add Wellbutrin to it. It will help her with concentration as well. Prescription sent to her pharmacy.

## 2019-06-02 NOTE — Telephone Encounter (Signed)
Called pt and left a message to see how she was doing with the added medication.

## 2019-06-03 ENCOUNTER — Encounter: Payer: Self-pay | Admitting: Psychiatry

## 2019-06-03 ENCOUNTER — Other Ambulatory Visit: Payer: Self-pay

## 2019-06-03 ENCOUNTER — Encounter: Payer: Self-pay | Admitting: Internal Medicine

## 2019-06-03 ENCOUNTER — Ambulatory Visit (INDEPENDENT_AMBULATORY_CARE_PROVIDER_SITE_OTHER): Payer: 59 | Admitting: Psychiatry

## 2019-06-03 DIAGNOSIS — F900 Attention-deficit hyperactivity disorder, predominantly inattentive type: Secondary | ICD-10-CM | POA: Insufficient documentation

## 2019-06-03 DIAGNOSIS — F418 Other specified anxiety disorders: Secondary | ICD-10-CM

## 2019-06-03 MED ORDER — AMPHETAMINE-DEXTROAMPHET ER 20 MG PO CP24: 20.0000 mg | ORAL_CAPSULE | ORAL | 0 refills | Status: DC

## 2019-06-03 NOTE — Telephone Encounter (Signed)
Thank you :)

## 2019-06-03 NOTE — Progress Notes (Signed)
[facility] MD OP Progress Note  I connected with  Dawn Ortiz on 06/03/19 by a video enabled telemedicine application and verified that I am speaking with the correct person using two identifiers.   I discussed the limitations of evaluation and management by telemedicine. The patient expressed understanding and agreed to proceed.   06/03/2019 10:16 AM Dawn Ortiz  MRN:  [id number]  Chief Complaint: " I wanted to ask if I could start medication for focusing."  HPI: Pt was seen about 2 weeks ago and was restarted on Sertraline 50 mg and Lorazepam 0.5 mg Prn for depression and anxiety at that time. Pt reported that she feels sertraline is helping and she has used Lorazepam only 3 or 4 times. 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 This is causing significant stress to her.  She also reported having headaches on a daily basis. She asked if the writer could prescribe Fioricet to her as she has researched it online. She is not certain of the cause of the headaches. She denied frequent episodes of hypo or hyperglycemia. She denied excessive intake of caffeine drinks. Other than stress due to work she denied any triggers.  Pt was asked questions from Adult ADHD self-report scale from [facility] for ADHD symptom screening. She answered very often to most of the questions asked.    She reported that despite academic difficulties in school she has never been formally diagnosed with ADHD or prescribed any medications for the same.  Visit Diagnosis:    ICD-10-CM   1. Depression with anxiety  F41.8   2. Attention deficit hyperactivity disorder (ADHD), predominantly inattentive type  F90.0     Past Psychiatric History: Depression with anxiety  Past Medical History:  Past Medical History:  Diagnosis Date  . Anxiety   . Diabetes mellitus without complication ([place])   . GERD (gastroesophageal reflux disease)     Past Surgical History:  Procedure  Laterality Date  . I&D EXTREMITY Left 03/05/2019   Procedure: Irrigation and debridement;  Surgeon: Olean Ree, MD;  Location: [facility] ORS;  Service: General;  Laterality: Left;  . INCISION AND DRAINAGE     right breast x1 and left groin x1 and left axilla  . INCISION AND DRAINAGE ABSCESS Left 07/02/2016   Procedure: INCISION AND DRAINAGE ABSCESS;  Surgeon: Florene Glen, MD;  Location: [facility] OR[facility];  Service: General;  Laterality: Left;  . INCISION AND DRAINAGE ABSCESS Left 04/18/2017   Procedure: INCISION AND DRAINAGE ABSCESS-LEFT GROIN;  Surgeon: Vickie Epley, MD;  Location: [facility] OR[facility];  Service: General;  Laterality: Left;  . INCISION AND DRAINAGE ABSCESS Right 05/21/2018   Procedure: INCISION AND DRAINAGE ABSCESS- RIGHT THIGH;  Surgeon: Olean Ree, MD;  Location: [facility] OR[facility];  Service: General;  Laterality: Right;  . IRRIGATION AND DEBRIDEMENT ABSCESS Right 04/09/2018   Procedure: IRRIGATION AND DEBRIDEMENT ABSCESS;  Surgeon: Olean Ree, MD;  Location: [facility] OR[facility];  Service: General;  Laterality: Right;  . IRRIGATION AND DEBRIDEMENT ABSCESS Left 05/21/2018   Procedure: IRRIGATION AND DEBRIDEMENT BREAST ABSCESS;  Surgeon: Olean Ree, MD;  Location: [facility] OR[facility];  Service: General;  Laterality: Left;  . PILONIDAL CYST EXCISION  12/03/2014   Procedure: CYST EXCISION PILONIDAL EXTENSIVE;  Surgeon: Molly Maduro, MD;  Location: [facility] OR[facility];  Service: General;;    Family Psychiatric History: denied  Family History:  Family History  Problem Relation Age of Onset  . Healthy Mother   . Hypertension Father   . Diabetes  Father   . Heart disease Father   . Sarcoidosis Father   . Healthy Brother   . Obesity Brother   . Cancer Paternal Grandfather        ?prostate   . Hypertension Other     Social History:  Social History   Socioeconomic History  . Marital status: Single    Spouse name: Not on file  . Number of children: Not on file  . Years of education: Not on file  . Highest education  level: Not on file  Occupational History  . Not on file  Social Needs  . Financial resource strain: Not on file  . Food insecurity    Worry: Not on file    Inability: Not on file  . Transportation needs    Medical: Not on file    Non-medical: Not on file  Tobacco Use  . Smoking status: Former Smoker    Types: Cigarettes  . Smokeless tobacco: Never Used  Substance and Sexual Activity  . Alcohol use: Yes    Alcohol/week: 1.0 - 2.0 standard drinks    Types: 1 - 2 Glasses of wine per week    Comment: ocassional  . Drug use: No  . Sexual activity: Yes    Birth control/protection: Implant  Lifestyle  . Physical activity    Days per week: Not on file    Minutes per session: Not on file  . Stress: Not on file  Relationships  . Social Musician on phone: Not on file    Gets together: Not on file    Attends religious service: Not on file    Active member of club or organization: Not on file    Attends meetings of clubs or organizations: Not on file    Relationship status: Not on file  Other Topics Concern  . Not on file  Social History Narrative   DPR mom Kavita Bartl [phone]    Dad Ladeana Laplant [phone]    No kids    Works for [employer]    Allergies:  Allergies  Allergen Reactions  . Sulfa Antibiotics Hives    Metabolic Disorder Labs: Lab Results  Component Value Date   HGBA1C 10.0 (H) 02/26/2019   No results found for: PROLACTIN Lab Results  Component Value Date   CHOL 127 02/26/2019   TRIG 96 02/26/2019   HDL 37 (L) 02/26/2019   CHOLHDL 3.4 02/26/2019   LDLCALC 71 02/26/2019   LDLCALC 94 10/21/2017   Lab Results  Component Value Date   TSH 0.773 02/26/2019   TSH 0.425 (L) 10/12/2016    Therapeutic Level Labs: No results found for: LITHIUM No results found for: VALPROATE No components found for:  CBMZ  Current Medications: Current Outpatient Medications  Medication Sig Dispense Refill  . buPROPion (WELLBUTRIN XL) 150 MG 24  hr tablet Take 1 tablet (150 mg total) by mouth every morning. 30 tablet 1  . etonogestrel (NEXPLANON) 68 MG IMPL implant 1 each by Subdermal route once.    . fluconazole (DIFLUCAN) 150 MG tablet Take 1 tablet (150 mg total) by mouth daily. Repeat in 3 days prn 5 tablet 0  . ibuprofen (ADVIL) 600 MG tablet Take 1 tablet (600 mg total) by mouth every 8 (eight) hours as needed. 30 tablet 0  . insulin aspart protamine- aspart (NOVOLOG MIX 70/30) (70-30) 100 UNIT/ML injection Inject 40-45 Units into the skin 2 (two) times daily with a meal.    .  Insulin Degludec (TRESIBA) 100 UNIT/ML SOLN Inject 40 Units into the skin.    Marland Kitchen. lisinopril (PRINIVIL,ZESTRIL) 2.5 MG tablet Take 2.5 mg by mouth daily.    Marland Kitchen. LORazepam (ATIVAN) 0.5 MG tablet Take 1 tablet (0.5 mg total) by mouth 2 (two) times daily as needed for anxiety. 45 tablet 1  . omeprazole (PRILOSEC) 40 MG capsule Take 1 capsule (40 mg total) by mouth daily. 30 minutes before food 60 capsule 2  . ONE TOUCH ULTRA TEST test strip TEST BLOOD SUGAR QID  0  . oxyCODONE (OXY IR/ROXICODONE) 5 MG immediate release tablet Take 1 tablet (5 mg total) by mouth every 4 (four) hours as needed for severe pain. 20 tablet 0  . Semaglutide,0.25 or 0.5MG /DOS, (OZEMPIC, 0.25 OR 0.5 MG/DOSE,) 2 MG/1.5ML SOPN Inject into the skin once a week.    . sertraline (ZOLOFT) 50 MG tablet Take 1 tablet (50 mg total) by mouth daily. 30 tablet 1   No current facility-administered medications for this visit.      Musculoskeletal: Strength & Muscle Tone: unable to assess due to telemed visit Gait & Station: unable to assess due to telemed visit Patient leans: unable to assess due to telemed visit   Psychiatric Specialty Exam: ROS  There were no vitals taken for this visit.There is no height or weight on file to calculate BMI.  General Appearance: Well Groomed, wearing work Runner, broadcasting/film/videojacket  Eye Contact:  Good  Speech:  Clear and Coherent and Normal Rate  Volume:  Normal  Mood:  Euthymic   Affect:  Congruent  Thought Process:  Goal Directed, Linear and Descriptions of Associations: Intact  Orientation:  Full (Time, Place, and Person)  Thought Content: Logical   Suicidal Thoughts:  No  Homicidal Thoughts:  No  Memory:  Recent;   Good Remote;   Good  Judgement:  Fair  Insight:  Fair  Psychomotor Activity:  Normal  Concentration:  Concentration: Good and Attention Span: Good  Recall:  Good  Fund of Knowledge: Good  Language: Good  Akathisia:  No  Handed:  Right  AIMS (if indicated): not done  Assets:  Communication Skills Desire for Improvement Financial Resources/Insurance Housing Talents/Skills Transportation Vocational/Educational  ADL's:  Intact  Cognition: WNL  Sleep:  Good   Screenings: Adult ADHD Self-Report Scale (ASRS-v1.1) Symptom Checklist   Assessment and Plan: Pt reported that her depression and anxiety are controlled now, however, she is still struggling with difficulty in focusing at work 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 This is attributing to her stress which may be causing her to have frequent headaches. She was advised to discuss regarding her headaches with her PCP, was referred to neurology but did not hear back from them. Based on screening scale, she meets criteria for ADHD, inattentive type.  1. Depression with anxiety -Continue Sertraline 50 mg daily' -Continue Lorazepam 0.5 mg PRN.  2. Attention deficit hyperactivity disorder (ADHD), predominantly inattentive type  - Start amphetamine-dextroamphetamine (ADDERALL XR) 20 MG 24 hr capsule; Take 1 capsule (20 mg total) by mouth every morning.  Dispense: 30 capsule; Refill: 0  Potential side effects of medication and risks vs benefits of treatment vs non-treatment were explained and discussed. All questions were answered.  F/up in 4 weeks.   Zena AmosMandeep Bricelyn Freestone, MD 06/03/2019, 10:16 AM

## 2019-06-10 ENCOUNTER — Encounter: Payer: Self-pay | Admitting: Neurology

## 2019-06-10 ENCOUNTER — Ambulatory Visit: Payer: Managed Care, Other (non HMO) | Admitting: Neurology

## 2019-06-10 ENCOUNTER — Other Ambulatory Visit: Payer: Self-pay

## 2019-06-10 VITALS — BP 125/77 | HR 103 | Temp 97.7°F | Ht 62.0 in | Wt 172.0 lb

## 2019-06-10 DIAGNOSIS — G43019 Migraine without aura, intractable, without status migrainosus: Secondary | ICD-10-CM | POA: Diagnosis not present

## 2019-06-10 DIAGNOSIS — G4489 Other headache syndrome: Secondary | ICD-10-CM | POA: Diagnosis not present

## 2019-06-10 DIAGNOSIS — G44209 Tension-type headache, unspecified, not intractable: Secondary | ICD-10-CM | POA: Diagnosis not present

## 2019-06-10 MED ORDER — RIZATRIPTAN BENZOATE 10 MG PO TBDP: 10.0000 mg | ORAL_TABLET | ORAL | 11 refills | Status: DC | PRN

## 2019-06-10 MED ORDER — ONDANSETRON HCL 4 MG PO TABS: 4.0000 mg | ORAL_TABLET | Freq: Three times a day (TID) | ORAL | 0 refills | Status: DC | PRN

## 2019-06-10 MED ORDER — CYCLOBENZAPRINE HCL 10 MG PO TABS: ORAL_TABLET | ORAL | 3 refills | Status: DC

## 2019-06-10 MED ORDER — CYCLOBENZAPRINE HCL 10 MG PO TABS: 10.0000 mg | ORAL_TABLET | Freq: Three times a day (TID) | ORAL | 3 refills | Status: DC | PRN

## 2019-06-10 NOTE — Progress Notes (Signed)
Subjective:    Patient ID: Dawn Ortiz is a 28 y.o. female.  HPI     Huston Foley, MD, PhD [facility] [address] [address] [place], [place] [address]  Dear Dr. Judie Grieve,  I saw your patient, Dawn Ortiz, upon your kind request in my neurologic clinic today for initial consultation of her recurrent headaches, concern for migraines.  The patient is unaccompanied today.  As you know, Dawn Ortiz is a 28 year old right-handed woman with an underlying medical history of diabetes, reflux disease, depression, anxiety, followed by psychiatry, and obesity, who reports recurrent headaches for the past 6 to 8 weeks.  She has had nearly daily headaches.  She reports that she has a throbbing headache at times, maybe 2 or 3 times a week which is also associated with nausea, rare vomiting, photophobia and sonophobia.  In addition, she reports a more dull and achy headache in the bitemporal region or in the back of the head, also stiffness in the neck and tightness in the neck and shoulder areas.  Triggers for her headaches could include dehydration, sleep deprivation, and stress she admits.  She does not always get 7 or 8 hours of sleep.  Sometimes she only gets 5-1/2 to 6 hours of sleep.  Her schedule varies.  She does not have a set bedtime or rise time.  She admits that she does not hydrate very well at all, she estimates that she drinks about half a bottle or 1 bottle of water per day, 16 ounce, she has started drinking some Gatorade but no more than 1 bottle per day.  Her paternal aunt has migraines.  She has tried over-the-counter medications including Tylenol, Advil, Excedrin, Goody powder, which help dull the pain temporarily.  For nausea, she has tried some Zofran in the past, she has been referred to GI and has an appointment pending.  She has also been referred to endocrinology.  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  She denies any one-sided weakness or numbness or tingling or droopy face, denies any visual symptoms and no visual aura is reported.  She does not drink daily caffeine.  She is a non-smoker.  She drinks alcohol very occasionally.  Of note, she snores and endorses significant daytime somnolence, her Epworth sleepiness score is 16 out of 24, fatigue severity score is 60 out of 63.  She was recently started on Adderall per psychiatry.  She has been on sertraline for depression and lorazepam as needed for anxiety.  Her Past Medical History Is Significant For: Past Medical History:  Diagnosis Date  . Anxiety   . Diabetes mellitus without complication (HCC)   . GERD (gastroesophageal reflux disease)     Her Past Surgical History Is Significant For: Past Surgical History:  Procedure Laterality Date  . I&D EXTREMITY Left 03/05/2019   Procedure: Irrigation and debridement;  Surgeon: Henrene Dodge, MD;  Location: [facility];  Service: General;  Laterality: Left;  . INCISION AND DRAINAGE     right breast x1 and left groin x1 and left axilla  . INCISION AND DRAINAGE ABSCESS Left 07/02/2016   Procedure: INCISION AND DRAINAGE ABSCESS;  Surgeon: Lattie Haw, MD;  Location: [facility];  Service: General;  Laterality: Left;  . INCISION AND DRAINAGE ABSCESS Left 04/18/2017   Procedure: INCISION AND DRAINAGE ABSCESS-LEFT GROIN;  Surgeon: Ancil Linsey, MD;  Location:  [facility];  Service: General;  Laterality: Left;  . INCISION AND DRAINAGE ABSCESS Right 05/21/2018   Procedure: INCISION AND DRAINAGE ABSCESS- RIGHT THIGH;  Surgeon: Olean Ree, MD;  Location: [facility];  Service: General;  Laterality: Right;  . IRRIGATION AND DEBRIDEMENT ABSCESS Right 04/09/2018   Procedure: IRRIGATION AND DEBRIDEMENT ABSCESS;  Surgeon: Olean Ree, MD;  Location: [facility];  Service: General;  Laterality: Right;  . IRRIGATION AND DEBRIDEMENT ABSCESS Left 05/21/2018    Procedure: IRRIGATION AND DEBRIDEMENT BREAST ABSCESS;  Surgeon: Olean Ree, MD;  Location: [facility];  Service: General;  Laterality: Left;  . PILONIDAL CYST EXCISION  12/03/2014   Procedure: CYST EXCISION PILONIDAL EXTENSIVE;  Surgeon: Molly Maduro, MD;  Location: [facility];  Service: General;;    Her Family History Is Significant For: Family History  Problem Relation Age of Onset  . Healthy Mother   . Hypertension Father   . Diabetes Father   . Heart disease Father   . Sarcoidosis Father   . Healthy Brother   . Obesity Brother   . Cancer Paternal Grandfather        ?prostate   . Hypertension Other     Her Social History Is Significant For: Social History   Socioeconomic History  . Marital status: Single    Spouse name: Not on file  . Number of children: Not on file  . Years of education: Not on file  . Highest education level: Not on file  Occupational History  . Not on file  Social Needs  . Financial resource strain: Not on file  . Food insecurity    Worry: Not on file    Inability: Not on file  . Transportation needs    Medical: Not on file    Non-medical: Not on file  Tobacco Use  . Smoking status: Former Smoker    Types: Cigarettes  . Smokeless tobacco: Never Used  Substance and Sexual Activity  . Alcohol use: Yes    Alcohol/week: 1.0 - 2.0 standard drinks    Types: 1 - 2 Glasses of wine per week    Comment: ocassional  . Drug use: No  . Sexual activity: Yes    Birth control/protection: Implant  Lifestyle  . Physical activity    Days per week: Not on file    Minutes per session: Not on file  . Stress: Not on file  Relationships  . Social Herbalist on phone: Not on file    Gets together: Not on file    Attends religious service: Not on file    Active member of club or organization: Not on file    Attends meetings of clubs or organizations: Not on file    Relationship status: Not on file  Other Topics Concern  . Not on file  Social  History Narrative   DPR mom Dawn Ortiz [phone]    Dad Dawn Ortiz [phone]    No kids    Works for [employer]    Her Allergies Are:  Allergies  Allergen Reactions  . Sulfa Antibiotics Hives  :   Her Current Medications Are:  Outpatient Encounter Medications as of 06/10/2019  Medication Sig  . amphetamine-dextroamphetamine (ADDERALL XR) 20 MG 24 hr capsule Take 1 capsule (20 mg total) by mouth every morning.  . Insulin Degludec (TRESIBA) 100 UNIT/ML SOLN Inject 40 Units into the skin.  Marland Kitchen lisinopril (PRINIVIL,ZESTRIL) 2.5 MG tablet Take 2.5 mg by mouth daily.  Marland Kitchen LORazepam (ATIVAN)  0.5 MG tablet Take 1 tablet (0.5 mg total) by mouth 2 (two) times daily as needed for anxiety.  . norelgestromin-ethinyl estradiol Burr Medico(XULANE) 150-35 MCG/24HR transdermal patch Place 1 patch onto the skin once a week. xulane patch  . omeprazole (PRILOSEC) 40 MG capsule Take 1 capsule (40 mg total) by mouth daily. 30 minutes before food  . ONE TOUCH ULTRA TEST test strip TEST BLOOD SUGAR QID  . Semaglutide,0.25 or 0.5MG /DOS, (OZEMPIC, 0.25 OR 0.5 MG/DOSE,) 2 MG/1.5ML SOPN Inject into the skin once a week.  . sertraline (ZOLOFT) 50 MG tablet Take 1 tablet (50 mg total) by mouth daily.  Marland Kitchen. spironolactone (ALDACTONE) 100 MG tablet Take 100 mg by mouth daily.  . cyclobenzaprine (FLEXERIL) 10 MG tablet Take 1/2 pill to 1 pill at bedtime as needed  . ondansetron (ZOFRAN) 4 MG tablet Take 1 tablet (4 mg total) by mouth every 8 (eight) hours as needed for nausea or vomiting.  . rizatriptan (MAXALT-MLT) 10 MG disintegrating tablet Take 1 tablet (10 mg total) by mouth as needed for migraine. May repeat in 2 hours if needed  . [DISCONTINUED] buPROPion (WELLBUTRIN XL) 150 MG 24 hr tablet Take 1 tablet (150 mg total) by mouth every morning.  . [DISCONTINUED] cyclobenzaprine (FLEXERIL) 10 MG tablet Take 1 tablet (10 mg total) by mouth 3 (three) times daily as needed for muscle spasms.  . [DISCONTINUED] etonogestrel  (NEXPLANON) 68 MG IMPL implant 1 each by Subdermal route once.  . [DISCONTINUED] fluconazole (DIFLUCAN) 150 MG tablet Take 1 tablet (150 mg total) by mouth daily. Repeat in 3 days prn  . [DISCONTINUED] ibuprofen (ADVIL) 600 MG tablet Take 1 tablet (600 mg total) by mouth every 8 (eight) hours as needed.  . [DISCONTINUED] insulin aspart protamine- aspart (NOVOLOG MIX 70/30) (70-30) 100 UNIT/ML injection Inject 40-45 Units into the skin 2 (two) times daily with a meal.  . [DISCONTINUED] oxyCODONE (OXY IR/ROXICODONE) 5 MG immediate release tablet Take 1 tablet (5 mg total) by mouth every 4 (four) hours as needed for severe pain.   No facility-administered encounter medications on file as of 06/10/2019.   :   Review of Systems:  Out of a complete 14 point review of systems, all are reviewed and negative with the exception of these symptoms as listed below:  Review of Systems  Neurological:       Pt presents today to discuss her migraines. Pt has a daily headache that can last from 30 mins to all day. She has associated N/V, light and sound sensitivity.Pt has tried ibuprofen, tylenol, goody powder, and excedrin for her migraines and they only offer minimal relief. Pt has not had a brain scan. Pt does not endorse snoring. Pt has never had a sleep study.  Epworth Sleepiness Scale 0= would never doze 1= slight chance of dozing 2= moderate chance of dozing 3= high chance of dozing  Sitting and reading: 3 Watching TV: 2 Sitting inactive in a public place (ex. Theater or meeting): 2 As a passenger in a car for an hour without a break: 2 Lying down to rest in the afternoon: 3 Sitting and talking to someone: 1 Sitting quietly after lunch (no alcohol): 2 In a car, while stopped in traffic: 1 Total: 16     Objective:  Neurological Exam  Physical Exam Physical Examination:   Vitals:   06/10/19 0818  BP: 125/77  Pulse: (!) 103  Temp: 97.7 F (36.5 C)    General Examination: The  patient is a very  pleasant 28 y.o. female in no acute distress. She appears well-developed and well-nourished and well groomed.   HEENT: Normocephalic, atraumatic, pupils are equal, round and reactive to light and accommodation. Funduscopic exam is normal with sharp disc margins noted. Extraocular tracking is good without limitation to gaze excursion or nystagmus noted. Normal smooth pursuit is noted. Hearing is grossly intact. Face is symmetric with normal facial animation and normal facial sensation. Speech is clear with no dysarthria noted. There is no hypophonia. There is no lip, neck/head, jaw or voice tremor. Neck is supple with full range of passive and active motion. There are no carotid bruits on auscultation. Oropharynx exam reveals: mild mouth dryness, adequate dental hygiene and mild airway crowding, due to Tonsils of 1-2+, Mallampati class II, neck circumference is 15 inches, tongue protrudes centrally in palate elevates symmetrically.  Chest: Clear to auscultation without wheezing, rhonchi or crackles noted.  Heart: S1+S2+0, regular and normal without murmurs, rubs or gallops noted.   Abdomen: Soft, non-tender and non-distended with normal bowel sounds appreciated on auscultation.  Extremities: There is no pitting edema in the distal lower extremities bilaterally. Pedal pulses are intact.  Skin: Warm and dry without trophic changes noted.  Musculoskeletal: exam reveals no obvious joint deformities, tenderness or joint swelling or erythema.   Neurologically:  Mental status: The patient is awake, alert and oriented in all 4 spheres. Her immediate and remote memory, attention, language skills and fund of knowledge are appropriate. There is no evidence of aphasia, agnosia, apraxia or anomia. Speech is clear with normal prosody and enunciation. Thought process is linear. Mood is normal and affect is normal.  Cranial nerves II - XII are as described above under HEENT exam. In addition:  shoulder shrug is normal with equal shoulder height noted. Motor exam: Normal bulk, strength and tone is noted. There is no drift, tremor or rebound. Romberg is negative. Reflexes are 2+ throughout. Babinski: Toes are flexor bilaterally. Fine motor skills and coordination: intact with normal finger taps, normal hand movements, normal rapid alternating patting, normal foot taps and normal foot agility.  Cerebellar testing: No dysmetria or intention tremor on finger to nose testing. Heel to shin is unremarkable bilaterally. There is no truncal or gait ataxia.  Sensory exam: intact to light touch, vibration, temperature sense in the upper and lower extremities.  Gait, station and balance: She stands easily. No veering to one side is noted. No leaning to one side is noted. Posture is age-appropriate and stance is narrow based. Gait shows normal stride length and normal pace. No problems turning are noted. Tandem walk is unremarkable.   Assessment and Plan:  In summary, Dawn Ortiz is a very pleasant 28 y.o.-year old female with an underlying medical history of diabetes, reflux disease, depression, anxiety, followed by psychiatry, and obesity, who Presents for evaluation of her recurrent headaches.  Her history is not suggestive of a single unifying diagnosis.  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  She does have some additional triggers including stress and possibly dehydration as she admits that she typically does not drink much in the way of water at all and also generally speaking in terms of fluids.  She is strongly encouraged to drink more water.  We talked about the importance of stress reduction.  She is advised to use Maxalt as needed for her migrainous headaches.  We talked about expectations and side effects potentiallyFor  as needed use for nausea I will provide a  prescription for ondansetron.For her neck muscle spasms and tension type headaches I suggested as needed use of Flexeril, she is advised to use a low dose only at night for now and she is advised that it can make her sleepy.I do believe we need to proceed with diagnostic testing.  Since she has a fairly consistent headacheIn the past 6 weeks or so, I suggested we proceed with a brain MRI to rule out a structural cause of her symptoms, in addition I would like to proceed with a sleep study to rule out underlying sleep disordered breathing. We will call her soon to schedule these tests.  She is advised to follow-up in our office routinely and keep [place] posted as to her symptoms.  She was given 3 new prescriptions today including Flexeril as needed, ondansetron as needed, Maxalt as needed.  We will also keep her posted as to her test results by phone call.I answered all her questions today and she was in with the plan. In the near future, we may consider a migraine preventative as well.  Thank you very much for allowing me to participate in the care of this nice patient. If I can be of any further assistance to you please do not hesitate to call me at [phone].  Sincerely,   Huston Foley, MD, PhD

## 2019-06-10 NOTE — Patient Instructions (Addendum)
I believe you have a combination of headaches, some of your headaches are migrainous, some of your headaches seem to be tension headaches and related to neck muscle spasms.  For your migraines, for as needed use, I suggest you take: Maxalt orally disintegrating tab, 10 mg: take 1 pill early on when you suspect a migraine attack come on. You may take another pill within 2 hours, no more than 2 pills in 24 hours. Most people who take triptans do not have any serious side-effects. However, they can cause drowsiness (remember to not drive or use heavy machinery when drowsy), nausea, dizziness, dry mouth. Less common side effects include strange sensations, such as tightness in your chest or throat, tingling, flushing, and feelings of heaviness or pressure in areas such as the face, limbs, and chest. These in the chest can mimic heart related pain (angina) and may cause alarm, but usually these sensations are not harmful or a sign of a heart attack. However, if you develop intense chest pain or sensations of discomfort, you should stop taking your medication and consult with me or your PCP or go to the nearest urgent care facility or ER or call 911.   For nausea, for as needed use, I suggest you try it Zofran again, 4 mg strength generic called ondansetron: take one pill as needed up to 3 times a day.  Since you also have what sounds like tension headache and neck muscle spasm related headaches, I suggest you try, also on an as-needed basis, Flexeril 10 mg strength, generic, which is called cyclobenzaprine, half a pill to up to 1 pill at bedtime as needed. Side effects may include dizziness, sleepiness, mouth dryness, feeling off balance, so please be mindful. You may not be able to drive after taking it.   For diagnostic testing, I suggest we proceed with sleep study evaluation to rule out underlying sleep apnea which can be a contributor to recurrent headaches and daytime sleepiness.  If you have obstructive  sleep apnea I suggest we treat you with a so-called CPAP or AutoPap machine.  In addition, I suggest we proceed with a brain MRI to rule out a structural cause of your headaches which have been escalating in the past 6 weeks.   Please remember, common headache triggers are: sleep deprivation, dehydration, overheating, stress, hypoglycemia or skipping meals and blood sugar fluctuations, excessive pain medications or excessive alcohol use or caffeine withdrawal. Some people have food triggers such as aged cheese, orange juice or chocolate, especially dark chocolate, or MSG (monosodium glutamate). Try to avoid these headache triggers as much possible. It may be helpful to keep a headache diary to figure out what makes your headaches worse or brings them on and what alleviates them. Some people report headache onset after exercise but studies have shown that regular exercise may actually prevent headaches from coming. If you have exercise-induced headaches, please make sure that you drink plenty of fluid before and after exercising and that you do not over do it and do not overheat.  Please try to hydrate better with water, you are suboptimal with your hydration.  Try to drink 6 to 8 cups of water or 3-4 16 ounce bottles of water per day. Try to achieve 7 to 8 hours of sleep on a daily basis.

## 2019-06-11 ENCOUNTER — Telehealth: Payer: Self-pay

## 2019-06-11 LAB — COMPREHENSIVE METABOLIC PANEL
ALT: 39 IU/L — ABNORMAL HIGH (ref 0–32)
AST: 29 IU/L (ref 0–40)
Albumin/Globulin Ratio: 1.5 (ref 1.2–2.2)
Albumin: 4.6 g/dL (ref 3.9–5.0)
Alkaline Phosphatase: 210 IU/L — ABNORMAL HIGH (ref 39–117)
BUN/Creatinine Ratio: 13 (ref 9–23)
BUN: 8 mg/dL (ref 6–20)
Bilirubin Total: 0.3 mg/dL (ref 0.0–1.2)
CO2: 22 mmol/L (ref 20–29)
Calcium: 9.8 mg/dL (ref 8.7–10.2)
Chloride: 99 mmol/L (ref 96–106)
Creatinine, Ser: 0.64 mg/dL (ref 0.57–1.00)
GFR calc Af Amer: 140 mL/min/{1.73_m2} (ref >59)
GFR calc non Af Amer: 122 mL/min/{1.73_m2} (ref >59)
Globulin, Total: 3.1 g/dL (ref 1.5–4.5)
Glucose: 123 mg/dL — ABNORMAL HIGH (ref 65–99)
Potassium: 4.2 mmol/L (ref 3.5–5.2)
Sodium: 139 mmol/L (ref 134–144)
Total Protein: 7.7 g/dL (ref 6.0–8.5)

## 2019-06-11 NOTE — Telephone Encounter (Signed)
-----   Message from Star Age, MD sent at 06/11/2019 10:05 AM EST ----- Kidney function is okay and therefore okay to proceed with a brain MRI with and without contrast as planned.  Her Liver enzymes have been elevated in the past, again noted today with higher values compared to before, As in 3 months ago. In particular, her alkaline phosphatase has increased further and her ALT has increased further.  I would recommend that she have close follow-up with her primary care physician and recheck of her liver function within the next 3 months.In addition, she is in the process of seeing GI, has an appointment 2 months from now which I would also recommend. Please update pt.

## 2019-06-11 NOTE — Progress Notes (Signed)
Kidney function is okay and therefore okay to proceed with a brain MRI with and without contrast as planned.  Her Liver enzymes have been elevated in the past, again noted today with higher values compared to before, As in 3 months ago. In particular, her alkaline phosphatase has increased further and her ALT has increased further.  I would recommend that she have close follow-up with her primary care physician and recheck of her liver function within the next 3 months.In addition, she is in the process of seeing GI, has an appointment 2 months from now which I would also recommend. Please update pt.

## 2019-06-11 NOTE — Telephone Encounter (Signed)
Patient sent to GI via email, [employer], they will obtain auth. DW

## 2019-06-11 NOTE — Telephone Encounter (Signed)
I called pt to discuss her lab results. No answer, left a message asking her to call me back. 

## 2019-06-11 NOTE — Telephone Encounter (Signed)
I called pt and discussed this with her. She will follow up with her PCP and GI team regarding the elevated liver enzymes. Pt verbalized understanding of results. Pt had no questions at this time but was encouraged to call back if questions arise.

## 2019-06-11 NOTE — Telephone Encounter (Signed)
Pt returning call please call back °

## 2019-06-17 ENCOUNTER — Ambulatory Visit: Payer: 59 | Admitting: Psychiatry

## 2019-06-22 ENCOUNTER — Telehealth: Payer: Self-pay

## 2019-06-22 NOTE — Telephone Encounter (Signed)
pt called left a message that she wanted to lincrease her medication. pt did not leave any details.

## 2019-06-22 NOTE — Telephone Encounter (Signed)
left message for pt to call office back to get more details

## 2019-06-30 ENCOUNTER — Encounter: Payer: Self-pay | Admitting: Psychiatry

## 2019-06-30 ENCOUNTER — Ambulatory Visit (INDEPENDENT_AMBULATORY_CARE_PROVIDER_SITE_OTHER): Payer: 59 | Admitting: Psychiatry

## 2019-06-30 ENCOUNTER — Other Ambulatory Visit: Payer: Self-pay

## 2019-06-30 DIAGNOSIS — F418 Other specified anxiety disorders: Secondary | ICD-10-CM

## 2019-06-30 DIAGNOSIS — F900 Attention-deficit hyperactivity disorder, predominantly inattentive type: Secondary | ICD-10-CM | POA: Diagnosis not present

## 2019-06-30 MED ORDER — AMPHETAMINE-DEXTROAMPHET ER 25 MG PO CP24: 25.0000 mg | ORAL_CAPSULE | ORAL | 0 refills | Status: DC

## 2019-06-30 MED ORDER — SERTRALINE HCL 50 MG PO TABS: 50.0000 mg | ORAL_TABLET | Freq: Every day | ORAL | 1 refills | Status: DC

## 2019-06-30 NOTE — Progress Notes (Signed)
[facility] MD OP Progress Note  I connected with  Dawn Ortiz on 06/30/19 by a video enabled telemedicine application and verified that I am speaking with the correct person using two identifiers.   I discussed the limitations of evaluation and management by telemedicine. The patient expressed understanding and agreed to proceed.   06/30/2019 8:45 AM Dawn Ortiz  MRN:  [id number]  Chief Complaint:  " I can tell a difference with Adderall XR."  HPI: Pt reported that adding Adderall XR to her regimen has helped her concentration.  She feels she is doing better at work.  Her mood has been stable and her anxiety is controlled as well.  She did inform that she had to take 2 tablets of lorazepam but she was traveling.  She stated that she has only had to take total of 3 tablets of lorazepam since her last visit.  Patient stated that she feels maybe a little reason her dose of Adderall XR will be beneficial as the medicine seems to wear off around 3:30 pm.  Visit Diagnosis:    ICD-10-CM   1. Depression with anxiety  F41.8   2. Attention deficit hyperactivity disorder (ADHD), predominantly inattentive type  F90.0     Past Psychiatric History: Depression, anxiety  Past Medical History:  Past Medical History:  Diagnosis Date  . Anxiety   . Diabetes mellitus without complication ([place])   . GERD (gastroesophageal reflux disease)     Past Surgical History:  Procedure Laterality Date  . I&D EXTREMITY Left 03/05/2019   Procedure: Irrigation and debridement;  Surgeon: Olean Ree, MD;  Location: [facility];  Service: General;  Laterality: Left;  . INCISION AND DRAINAGE     right breast x1 and left groin x1 and left axilla  . INCISION AND DRAINAGE ABSCESS Left 07/02/2016   Procedure: INCISION AND DRAINAGE ABSCESS;  Surgeon: Florene Glen, MD;  Location: [facility];  Service: General;  Laterality: Left;  . INCISION AND DRAINAGE ABSCESS Left 04/18/2017   Procedure: INCISION AND DRAINAGE ABSCESS-LEFT  GROIN;  Surgeon: Vickie Epley, MD;  Location: [facility];  Service: General;  Laterality: Left;  . INCISION AND DRAINAGE ABSCESS Right 05/21/2018   Procedure: INCISION AND DRAINAGE ABSCESS- RIGHT THIGH;  Surgeon: Olean Ree, MD;  Location: [facility];  Service: General;  Laterality: Right;  . IRRIGATION AND DEBRIDEMENT ABSCESS Right 04/09/2018   Procedure: IRRIGATION AND DEBRIDEMENT ABSCESS;  Surgeon: Olean Ree, MD;  Location: [facility];  Service: General;  Laterality: Right;  . IRRIGATION AND DEBRIDEMENT ABSCESS Left 05/21/2018   Procedure: IRRIGATION AND DEBRIDEMENT BREAST ABSCESS;  Surgeon: Olean Ree, MD;  Location: [facility];  Service: General;  Laterality: Left;  . PILONIDAL CYST EXCISION  12/03/2014   Procedure: CYST EXCISION PILONIDAL EXTENSIVE;  Surgeon: Molly Maduro, MD;  Location: [facility];  Service: General;;     Family History:  Family History  Problem Relation Age of Onset  . Healthy Mother   . Hypertension Father   . Diabetes Father   . Heart disease Father   . Sarcoidosis Father   . Healthy Brother   . Obesity Brother   . Cancer Paternal Grandfather        ?prostate   . Hypertension Other     Social History:  Social History   Socioeconomic History  . Marital status: Single    Spouse name: Not on file  . Number of children: Not on file  . Years of education: Not on file  .  Highest education level: Not on file  Occupational History  . Not on file  Social Needs  . Financial resource strain: Not on file  . Food insecurity    Worry: Not on file    Inability: Not on file  . Transportation needs    Medical: Not on file    Non-medical: Not on file  Tobacco Use  . Smoking status: Former Smoker    Types: Cigarettes  . Smokeless tobacco: Never Used  Substance and Sexual Activity  . Alcohol use: Yes    Alcohol/week: 1.0 - 2.0 standard drinks    Types: 1 - 2 Glasses of wine per week    Comment: ocassional  . Drug use: No  . Sexual activity: Yes     Birth control/protection: Implant  Lifestyle  . Physical activity    Days per week: Not on file    Minutes per session: Not on file  . Stress: Not on file  Relationships  . Social Musician on phone: Not on file    Gets together: Not on file    Attends religious service: Not on file    Active member of club or organization: Not on file    Attends meetings of clubs or organizations: Not on file    Relationship status: Not on file  Other Topics Concern  . Not on file  Social History Narrative   DPR mom Jimmy Stipes [phone]    Dad Andrika Peraza [phone]    No kids    Works for [employer]    Allergies:  Allergies  Allergen Reactions  . Sulfa Antibiotics Hives    Metabolic Disorder Labs: Lab Results  Component Value Date   HGBA1C 10.0 (H) 02/26/2019   No results found for: PROLACTIN Lab Results  Component Value Date   CHOL 127 02/26/2019   TRIG 96 02/26/2019   HDL 37 (L) 02/26/2019   CHOLHDL 3.4 02/26/2019   LDLCALC 71 02/26/2019   LDLCALC 94 10/21/2017   Lab Results  Component Value Date   TSH 0.773 02/26/2019   TSH 0.425 (L) 10/12/2016    Therapeutic Level Labs: No results found for: LITHIUM No results found for: VALPROATE No components found for:  CBMZ  Current Medications: Current Outpatient Medications  Medication Sig Dispense Refill  . amphetamine-dextroamphetamine (ADDERALL XR) 20 MG 24 hr capsule Take 1 capsule (20 mg total) by mouth every morning. 30 capsule 0  . cyclobenzaprine (FLEXERIL) 10 MG tablet Take 1/2 pill to 1 pill at bedtime as needed 30 tablet 3  . Insulin Degludec (TRESIBA) 100 UNIT/ML SOLN Inject 40 Units into the skin.    Marland Kitchen lisinopril (PRINIVIL,ZESTRIL) 2.5 MG tablet Take 2.5 mg by mouth daily.    Marland Kitchen LORazepam (ATIVAN) 0.5 MG tablet Take 1 tablet (0.5 mg total) by mouth 2 (two) times daily as needed for anxiety. 45 tablet 1  . norelgestromin-ethinyl estradiol [employer] [employer]ico) 150-35 MCG/24HR transdermal patch Place 1 patch  onto the skin once a week. xulane patch    . omeprazole (PRILOSEC) 40 MG capsule Take 1 capsule (40 mg total) by mouth daily. 30 minutes before food 60 capsule 2  . ondansetron (ZOFRAN) 4 MG tablet Take 1 tablet (4 mg total) by mouth every 8 (eight) hours as needed for nausea or vomiting. 20 tablet 0  . ONE TOUCH ULTRA TEST test strip TEST BLOOD SUGAR QID  0  . rizatriptan (MAXALT-MLT) 10 MG disintegrating tablet Take 1 tablet (10 mg total) by  mouth as needed for migraine. May repeat in 2 hours if needed 9 tablet 11  . Semaglutide,0.25 or 0.5MG /DOS, (OZEMPIC, 0.25 OR 0.5 MG/DOSE,) 2 MG/1.5ML SOPN Inject into the skin once a week.    . sertraline (ZOLOFT) 50 MG tablet Take 1 tablet (50 mg total) by mouth daily. 30 tablet 1  . spironolactone (ALDACTONE) 100 MG tablet Take 100 mg by mouth daily.     No current facility-administered medications for this visit.      Musculoskeletal: Strength & Muscle Tone: unable to assess due to telemed visit Gait & Station: unable to assess due to telemed visit Patient leans: unable to assess due to telemed visit   Psychiatric Specialty Exam: ROS  There were no vitals taken for this visit.There is no height or weight on file to calculate BMI.  General Appearance: Well Groomed  Eye Contact:  Good  Speech:  Clear and Coherent and Normal Rate  Volume:  Normal  Mood:  Euthymic  Affect:  Congruent  Thought Process:  Goal Directed, Linear and Descriptions of Associations: Intact  Orientation:  Full (Time, Place, and Person)  Thought Content: Logical   Suicidal Thoughts:  No  Homicidal Thoughts:  No  Memory:  Recent;   Good Remote;   Good  Judgement:  Good  Insight:  Good  Psychomotor Activity:  Normal  Concentration:  Concentration: Good and Attention Span: Good  Recall:  Good  Fund of Knowledge: Good  Language: Good  Akathisia:  No  Handed:  Right  AIMS (if indicated): not done  Assets:  Communication Skills Desire for Improvement Financial  Resources/Insurance Housing Social Support Talents/Skills Transportation  ADL's:  Intact  Cognition: WNL  Sleep:  Good    Assessment and Plan: Patient reported doing well on her current regimen she does feel that her dose of Adderall XR can be increased a little bit for optimal effect.  She was agreeable to going up on the dose to 25 mg daily.  1. Depression with anxiety  -Continue sertraline (ZOLOFT) 50 MG tablet; Take 1 tablet (50 mg total) by mouth daily.  Dispense: 30 tablet; Refill: 1  2. Attention deficit hyperactivity disorder (ADHD), predominantly inattentive type  -Increase amphetamine-dextroamphetamine (ADDERALL XR) 25 MG 24 hr capsule; Take 1 capsule by mouth every morning.  Dispense: 30 capsule; Refill: 0 - amphetamine-dextroamphetamine (ADDERALL XR) 25 MG 24 hr capsule; Take 1 capsule by mouth every morning.  Dispense: 30 capsule; Refill: 0    Zena AmosMandeep Cledith Kamiya, MD 06/30/2019, 8:45 AM

## 2019-07-06 ENCOUNTER — Other Ambulatory Visit: Payer: Self-pay

## 2019-07-06 ENCOUNTER — Other Ambulatory Visit: Payer: Managed Care, Other (non HMO)

## 2019-07-06 MED ORDER — ONDANSETRON HCL 4 MG PO TABS: 4.0000 mg | ORAL_TABLET | Freq: Three times a day (TID) | ORAL | 0 refills | Status: DC | PRN

## 2019-07-06 NOTE — Telephone Encounter (Signed)
Received a refill request for pt's zofran from [place] [facility].

## 2019-07-10 ENCOUNTER — Ambulatory Visit: Payer: Managed Care, Other (non HMO) | Admitting: Internal Medicine

## 2019-07-12 ENCOUNTER — Telehealth: Payer: Managed Care, Other (non HMO) | Admitting: Physician Assistant

## 2019-07-12 DIAGNOSIS — L989 Disorder of the skin and subcutaneous tissue, unspecified: Secondary | ICD-10-CM

## 2019-07-12 NOTE — Progress Notes (Signed)
Based on what you shared with me, I feel your condition warrants further evaluation and I recommend that you be seen for a face to face visit.  Please contact your primary care physician practice to be seen. Many offices offer virtual options to be seen via video if you are not comfortable going in person to a medical facility at this time.  It is hard to tell just from the picture but I agree there seems to be some fluid buildup. You need to be seen in person by your primary are provider so they can assess, potentially drain the area and culture the fluid to see if there are any signs of infection or inflammatory markers. This is all so the most appropriate treatment can be given.   If you do not have a PCP, [facility] offers a free physician referral service available at [phone]. Our trained staff has the experience, knowledge and resources to put you in touch with a physician who is right for you.   You also have the option of a video visit through [email or web address]  If you are having a true medical emergency please call 911.  NOTE: If you entered your credit card information for this eVisit, you will not be charged. You may see a "hold" on your card for the $35 but that hold will drop off and you will not have a charge processed.  Your e-visit answers were reviewed by a board certified advanced clinical practitioner to complete your personal care plan.  Thank you for using e-Visits.

## 2019-07-19 ENCOUNTER — Encounter: Payer: Self-pay | Admitting: Radiology

## 2019-07-19 ENCOUNTER — Emergency Department: Payer: Managed Care, Other (non HMO)

## 2019-07-19 ENCOUNTER — Other Ambulatory Visit: Payer: Self-pay

## 2019-07-19 ENCOUNTER — Inpatient Hospital Stay
Admission: EM | Admit: 2019-07-19 | Discharge: 2019-07-22 | DRG: 418 | Disposition: A | Discharge status: Home / Self Care | Payer: Managed Care, Other (non HMO) | Attending: Surgery | Admitting: Surgery

## 2019-07-19 DIAGNOSIS — Z809 Family history of malignant neoplasm, unspecified: Secondary | ICD-10-CM

## 2019-07-19 DIAGNOSIS — F418 Other specified anxiety disorders: Secondary | ICD-10-CM | POA: Diagnosis present

## 2019-07-19 DIAGNOSIS — Z87891 Personal history of nicotine dependence: Secondary | ICD-10-CM

## 2019-07-19 DIAGNOSIS — E119 Type 2 diabetes mellitus without complications: Secondary | ICD-10-CM | POA: Diagnosis not present

## 2019-07-19 DIAGNOSIS — R1013 Epigastric pain: Secondary | ICD-10-CM | POA: Diagnosis not present

## 2019-07-19 DIAGNOSIS — K8000 Calculus of gallbladder with acute cholecystitis without obstruction: Principal | ICD-10-CM | POA: Diagnosis present

## 2019-07-19 DIAGNOSIS — K219 Gastro-esophageal reflux disease without esophagitis: Secondary | ICD-10-CM | POA: Diagnosis present

## 2019-07-19 DIAGNOSIS — R109 Unspecified abdominal pain: Secondary | ICD-10-CM | POA: Diagnosis not present

## 2019-07-19 DIAGNOSIS — F900 Attention-deficit hyperactivity disorder, predominantly inattentive type: Secondary | ICD-10-CM

## 2019-07-19 DIAGNOSIS — I10 Essential (primary) hypertension: Secondary | ICD-10-CM | POA: Diagnosis present

## 2019-07-19 DIAGNOSIS — Z882 Allergy status to sulfonamides status: Secondary | ICD-10-CM

## 2019-07-19 DIAGNOSIS — K8050 Calculus of bile duct without cholangitis or cholecystitis without obstruction: Secondary | ICD-10-CM

## 2019-07-19 DIAGNOSIS — K821 Hydrops of gallbladder: Secondary | ICD-10-CM | POA: Diagnosis present

## 2019-07-19 DIAGNOSIS — Z8249 Family history of ischemic heart disease and other diseases of the circulatory system: Secondary | ICD-10-CM

## 2019-07-19 DIAGNOSIS — G43909 Migraine, unspecified, not intractable, without status migrainosus: Secondary | ICD-10-CM | POA: Diagnosis present

## 2019-07-19 DIAGNOSIS — R112 Nausea with vomiting, unspecified: Secondary | ICD-10-CM | POA: Diagnosis present

## 2019-07-19 DIAGNOSIS — D72829 Elevated white blood cell count, unspecified: Secondary | ICD-10-CM | POA: Diagnosis present

## 2019-07-19 DIAGNOSIS — E1165 Type 2 diabetes mellitus with hyperglycemia: Secondary | ICD-10-CM | POA: Diagnosis not present

## 2019-07-19 DIAGNOSIS — Z833 Family history of diabetes mellitus: Secondary | ICD-10-CM

## 2019-07-19 DIAGNOSIS — Z794 Long term (current) use of insulin: Secondary | ICD-10-CM

## 2019-07-19 DIAGNOSIS — Z20828 Contact with and (suspected) exposure to other viral communicable diseases: Secondary | ICD-10-CM | POA: Diagnosis present

## 2019-07-19 DIAGNOSIS — R1010 Upper abdominal pain, unspecified: Secondary | ICD-10-CM

## 2019-07-19 HISTORY — DX: Elevated white blood cell count, unspecified: D72.829

## 2019-07-19 LAB — URINALYSIS, COMPLETE (UACMP) WITH MICROSCOPIC
Bilirubin Urine: NEGATIVE
Glucose, UA: >=500 mg/dL — AB
Hgb urine dipstick: NEGATIVE
Ketones, ur: 5 mg/dL — AB
Leukocytes,Ua: NEGATIVE
Nitrite: NEGATIVE
Protein, ur: NEGATIVE mg/dL
Specific Gravity, Urine: 1.015 (ref 1.005–1.030)
pH: 7 (ref 5.0–8.0)

## 2019-07-19 LAB — CBC WITH DIFFERENTIAL/PLATELET
Abs Immature Granulocytes: 0.03 10*3/uL (ref 0.00–0.07)
Basophils Absolute: 0 10*3/uL (ref 0.0–0.1)
Basophils Relative: 0 %
Eosinophils Absolute: 0.1 10*3/uL (ref 0.0–0.5)
Eosinophils Relative: 1 %
HCT: 36.9 % (ref 36.0–46.0)
Hemoglobin: 12.7 g/dL (ref 12.0–15.0)
Immature Granulocytes: 0 %
Lymphocytes Relative: 23 %
Lymphs Abs: 3 10*3/uL (ref 0.7–4.0)
MCH: 26.1 pg (ref 26.0–34.0)
MCHC: 34.4 g/dL (ref 30.0–36.0)
MCV: 75.8 fL — ABNORMAL LOW (ref 80.0–100.0)
Monocytes Absolute: 0.8 10*3/uL (ref 0.1–1.0)
Monocytes Relative: 6 %
Neutro Abs: 9.2 10*3/uL — ABNORMAL HIGH (ref 1.7–7.7)
Neutrophils Relative %: 70 %
Platelets: 368 10*3/uL (ref 150–400)
RBC: 4.87 MIL/uL (ref 3.87–5.11)
RDW: 13.1 % (ref 11.5–15.5)
WBC: 13.1 10*3/uL — ABNORMAL HIGH (ref 4.0–10.5)
nRBC: 0 % (ref 0.0–0.2)

## 2019-07-19 LAB — LACTIC ACID, PLASMA: Lactic Acid, Venous: 1.9 mmol/L (ref 0.5–1.9)

## 2019-07-19 LAB — COMPREHENSIVE METABOLIC PANEL
ALT: 42 U/L (ref 0–44)
AST: 25 U/L (ref 15–41)
Albumin: 4.1 g/dL (ref 3.5–5.0)
Alkaline Phosphatase: 172 U/L — ABNORMAL HIGH (ref 38–126)
Anion gap: 12 (ref 5–15)
BUN: 9 mg/dL (ref 6–20)
CO2: 18 mmol/L — ABNORMAL LOW (ref 22–32)
Calcium: 9 mg/dL (ref 8.9–10.3)
Chloride: 103 mmol/L (ref 98–111)
Creatinine, Ser: 0.71 mg/dL (ref 0.44–1.00)
GFR calc Af Amer: >60 mL/min (ref >60)
GFR calc non Af Amer: >60 mL/min (ref >60)
Glucose, Bld: 285 mg/dL — ABNORMAL HIGH (ref 70–99)
Potassium: 3.8 mmol/L (ref 3.5–5.1)
Sodium: 133 mmol/L — ABNORMAL LOW (ref 135–145)
Total Bilirubin: 0.5 mg/dL (ref 0.3–1.2)
Total Protein: 8.8 g/dL — ABNORMAL HIGH (ref 6.5–8.1)

## 2019-07-19 LAB — GLUCOSE, CAPILLARY
Glucose-Capillary: 277 mg/dL — ABNORMAL HIGH (ref 70–99)
Glucose-Capillary: 226 mg/dL — ABNORMAL HIGH (ref 70–99)
Glucose-Capillary: 192 mg/dL — ABNORMAL HIGH (ref 70–99)

## 2019-07-19 LAB — LIPASE, BLOOD: Lipase: 41 U/L (ref 11–51)

## 2019-07-19 LAB — SARS CORONAVIRUS 2 (TAT 6-24 HRS): SARS Coronavirus 2: NEGATIVE

## 2019-07-19 LAB — POCT PREGNANCY, URINE: Preg Test, Ur: NEGATIVE

## 2019-07-19 LAB — HEMOGLOBIN A1C
Hgb A1c MFr Bld: 8.9 % — ABNORMAL HIGH (ref 4.8–5.6)
Mean Plasma Glucose: 208.73 mg/dL

## 2019-07-19 MED ORDER — LISINOPRIL 2.5 MG PO TABS
2.5000 mg | ORAL_TABLET | Freq: Every day | ORAL | Status: DC
  Administered 2019-07-20 – 2019-07-22 (×2): 2.5 mg via ORAL
  Filled 2019-07-19 (×4): qty 1

## 2019-07-19 MED ORDER — ONDANSETRON HCL 4 MG/2ML IJ SOLN
4.0000 mg | Freq: Once | INTRAMUSCULAR | Status: AC
  Administered 2019-07-19: 4 mg via INTRAVENOUS
  Filled 2019-07-19: qty 2

## 2019-07-19 MED ORDER — INSULIN DEGLUDEC 100 UNIT/ML ~~LOC~~ SOLN: 50.0000 [IU] | Freq: Every day | SUBCUTANEOUS | Status: DC

## 2019-07-19 MED ORDER — HYDROMORPHONE HCL 1 MG/ML IJ SOLN
1.0000 mg | Freq: Once | INTRAMUSCULAR | Status: AC
  Administered 2019-07-19: 1 mg via INTRAVENOUS
  Filled 2019-07-19: qty 1

## 2019-07-19 MED ORDER — HEPARIN SODIUM (PORCINE) 5000 UNIT/ML IJ SOLN: 5000.0000 [IU] | Freq: Three times a day (TID) | INTRAMUSCULAR | Status: DC

## 2019-07-19 MED ORDER — AMPHETAMINE-DEXTROAMPHET ER 5 MG PO CP24
25.0000 mg | ORAL_CAPSULE | ORAL | Status: DC
  Administered 2019-07-20: 25 mg via ORAL
  Filled 2019-07-19 (×4): qty 5

## 2019-07-19 MED ORDER — SODIUM CHLORIDE 0.9 % IV SOLN
1000.0000 mL | Freq: Once | INTRAVENOUS | Status: AC
  Administered 2019-07-19: 1000 mL via INTRAVENOUS

## 2019-07-19 MED ORDER — PANTOPRAZOLE SODIUM 40 MG PO TBEC
40.0000 mg | DELAYED_RELEASE_TABLET | Freq: Every day | ORAL | Status: DC
  Administered 2019-07-20 – 2019-07-22 (×2): 40 mg via ORAL
  Filled 2019-07-19 (×3): qty 1

## 2019-07-19 MED ORDER — MORPHINE SULFATE (PF) 4 MG/ML IV SOLN
4.0000 mg | Freq: Once | INTRAVENOUS | Status: AC
  Administered 2019-07-19: 4 mg via INTRAVENOUS
  Filled 2019-07-19: qty 1

## 2019-07-19 MED ORDER — METOCLOPRAMIDE HCL 5 MG/ML IJ SOLN
5.0000 mg | Freq: Three times a day (TID) | INTRAMUSCULAR | Status: DC
  Administered 2019-07-19 – 2019-07-22 (×9): 5 mg via INTRAVENOUS
  Filled 2019-07-19 (×9): qty 2

## 2019-07-19 MED ORDER — HYDRALAZINE HCL 25 MG PO TABS
25.0000 mg | ORAL_TABLET | Freq: Three times a day (TID) | ORAL | Status: DC | PRN
  Filled 2019-07-19: qty 1

## 2019-07-19 MED ORDER — SERTRALINE HCL 50 MG PO TABS
50.0000 mg | ORAL_TABLET | Freq: Every day | ORAL | Status: DC
  Administered 2019-07-20 – 2019-07-22 (×2): 50 mg via ORAL
  Filled 2019-07-19 (×2): qty 1

## 2019-07-19 MED ORDER — INSULIN GLARGINE 100 UNIT/ML ~~LOC~~ SOLN
50.0000 [IU] | Freq: Every morning | SUBCUTANEOUS | Status: DC
  Administered 2019-07-20 – 2019-07-22 (×2): 50 [IU] via SUBCUTANEOUS
  Filled 2019-07-19 (×3): qty 0.5

## 2019-07-19 MED ORDER — ONDANSETRON HCL 4 MG/2ML IJ SOLN
4.0000 mg | Freq: Three times a day (TID) | INTRAMUSCULAR | Status: DC | PRN
  Administered 2019-07-19: 4 mg via INTRAVENOUS
  Filled 2019-07-19: qty 2

## 2019-07-19 MED ORDER — ACETAMINOPHEN 650 MG RE SUPP: 650.0000 mg | Freq: Four times a day (QID) | RECTAL | Status: DC | PRN

## 2019-07-19 MED ORDER — CYCLOBENZAPRINE HCL 10 MG PO TABS
5.0000 mg | ORAL_TABLET | Freq: Three times a day (TID) | ORAL | Status: DC | PRN
  Administered 2019-07-20: 5 mg via ORAL
  Filled 2019-07-19: qty 1

## 2019-07-19 MED ORDER — SODIUM CHLORIDE 0.9 % IV SOLN: INTRAVENOUS | Status: DC

## 2019-07-19 MED ORDER — HYDROMORPHONE HCL 1 MG/ML IJ SOLN
1.0000 mg | INTRAMUSCULAR | Status: DC | PRN
  Administered 2019-07-19 – 2019-07-20 (×4): 1 mg via INTRAVENOUS
  Filled 2019-07-19 (×4): qty 1

## 2019-07-19 MED ORDER — INSULIN ASPART 100 UNIT/ML ~~LOC~~ SOLN
0.0000 [IU] | SUBCUTANEOUS | Status: DC
  Administered 2019-07-19: 8 [IU] via SUBCUTANEOUS
  Administered 2019-07-19: 3 [IU] via SUBCUTANEOUS
  Administered 2019-07-20 (×3): 2 [IU] via SUBCUTANEOUS
  Administered 2019-07-20: 3 [IU] via SUBCUTANEOUS
  Filled 2019-07-19 (×7): qty 1

## 2019-07-19 MED ORDER — IOHEXOL 300 MG/ML  SOLN
100.0000 mL | Freq: Once | INTRAMUSCULAR | Status: AC | PRN
  Administered 2019-07-19: 100 mL via INTRAVENOUS

## 2019-07-19 MED ORDER — LORAZEPAM 0.5 MG PO TABS
0.5000 mg | ORAL_TABLET | Freq: Two times a day (BID) | ORAL | Status: DC | PRN
  Administered 2019-07-20: 0.5 mg via ORAL
  Filled 2019-07-19: qty 1

## 2019-07-19 MED ORDER — ENOXAPARIN SODIUM 40 MG/0.4ML ~~LOC~~ SOLN
40.0000 mg | SUBCUTANEOUS | Status: DC
  Administered 2019-07-19 – 2019-07-21 (×3): 40 mg via SUBCUTANEOUS
  Filled 2019-07-19 (×3): qty 0.4

## 2019-07-19 MED ORDER — ACETAMINOPHEN 325 MG PO TABS
650.0000 mg | ORAL_TABLET | Freq: Four times a day (QID) | ORAL | Status: DC | PRN
  Administered 2019-07-20: 650 mg via ORAL
  Filled 2019-07-19: qty 2

## 2019-07-19 NOTE — ED Notes (Signed)
fsbs [id number]

## 2019-07-19 NOTE — ED Notes (Signed)
Report called to seirra rn floor nurse

## 2019-07-19 NOTE — ED Notes (Signed)
[id number]

## 2019-07-19 NOTE — ED Triage Notes (Signed)
Pt reports that the pain started in her lower back and abd but has spread over her entire abd. Pt obviously uncomfortable in triage.

## 2019-07-19 NOTE — ED Notes (Signed)
Pt c/o upper abd pain that woke her up this morning around 0730. Pt called EMS but refused transport. Pt pacing around room in pain. Pt actively vomiting in room. Pt reports 2 episodes of vomiting PTA. Pt denies diarrhea.

## 2019-07-19 NOTE — H&P (Signed)
History and Physical    Dawn Ortiz ZOX:[id number] DOB: 05-19-1991 DOA: 07/19/2019  Referring MD/NP/PA:   PCP: McLean-Scocuzza, Pasty Spillers, MD   Patient coming from:  The patient is coming from home.  At baseline, pt is independent for most of ADL.        Chief Complaint: nausea, vomiting, abdominal pain  HPI: Dawn Ortiz is a 28 y.o. female with medical history significant of hypertension, diabetes mellitus, GERD, depression with anxiety, ADHD, who presents with intractable nausea, vomiting, abdominal pain.  Patient states that her symptoms started this morning, including intractable nausea, vomiting, epigastric abdominal pain.  He has been having many times nonbiliary nonbloody vomitus.  Her abdominal pain is located in the epigastric area, constant, sharp, moderate to severe, nonradiating.  No diarrhea.  Denies fever or chills.  Patient does not have chest pain, shortness breath, symptoms of UTI or unilateral weakness.  No hematuria.  ED Course: pt was found to have lipase of 41, WBC 13.1, negative urinalysis for UTI, pending COVID-19 test, electrolytes renal function okay, liver function okay, temperature normal, blood pressure 156/99, tachycardia, tachypnea, oxygen saturation 99% on room air.  CT abdomen/pelvis is negative.  Right upper quadrant ultrasound showed cholelithiasis without evidence of cholecystitis.  Patient is placed MedSurg bed for observation  Review of Systems:   General: no fevers, chills, no body weight gain, has poor appetite, has fatigue HEENT: no blurry vision, hearing changes or sore throat Respiratory: no dyspnea, coughing, wheezing CV: no chest pain, no palpitations GI: has nausea, vomiting, abdominal pain, no diarrhea, constipation GU: no dysuria, burning on urination, increased urinary frequency, hematuria  Ext: no leg edema Neuro: no unilateral weakness, numbness, or tingling, no vision change or hearing loss Skin: no rash, no skin tear. MSK: No  muscle spasm, no deformity, no limitation of range of movement in spin Heme: No easy bruising.  Travel history: No recent long distant travel.  Allergy:  Allergies  Allergen Reactions  . Sulfa Antibiotics Hives    Past Medical History:  Diagnosis Date  . Anxiety   . Diabetes mellitus without complication ([facility])   . GERD (gastroesophageal reflux disease)     Past Surgical History:  Procedure Laterality Date  . I & D EXTREMITY Left 03/05/2019   Procedure: Irrigation and debridement;  Surgeon: Henrene Dodge, MD;  Location: [facility];  Service: General;  Laterality: Left;  . INCISION AND DRAINAGE     right breast x1 and left groin x1 and left axilla  . INCISION AND DRAINAGE ABSCESS Left 07/02/2016   Procedure: INCISION AND DRAINAGE ABSCESS;  Surgeon: Lattie Haw, MD;  Location: [facility];  Service: General;  Laterality: Left;  . INCISION AND DRAINAGE ABSCESS Left 04/18/2017   Procedure: INCISION AND DRAINAGE ABSCESS-LEFT GROIN;  Surgeon: Ancil Linsey, MD;  Location: [facility];  Service: General;  Laterality: Left;  . INCISION AND DRAINAGE ABSCESS Right 05/21/2018   Procedure: INCISION AND DRAINAGE ABSCESS- RIGHT THIGH;  Surgeon: Henrene Dodge, MD;  Location: [facility];  Service: General;  Laterality: Right;  . IRRIGATION AND DEBRIDEMENT ABSCESS Right 04/09/2018   Procedure: IRRIGATION AND DEBRIDEMENT ABSCESS;  Surgeon: Henrene Dodge, MD;  Location: [facility];  Service: General;  Laterality: Right;  . IRRIGATION AND DEBRIDEMENT ABSCESS Left 05/21/2018   Procedure: IRRIGATION AND DEBRIDEMENT BREAST ABSCESS;  Surgeon: Henrene Dodge, MD;  Location: [facility];  Service: General;  Laterality: Left;  . PILONIDAL CYST EXCISION  12/03/2014   Procedure: CYST EXCISION PILONIDAL EXTENSIVE;  Surgeon: Duwaine Maxin, MD;  Location: [facility];  Service: General;;    Social History:  reports that she has quit smoking. Her smoking use included cigarettes. She has never used smokeless tobacco. She  reports current alcohol use of about 1.0 - 2.0 standard drinks of alcohol per week. She reports that she does not use drugs.  Family History:  Family History  Problem Relation Age of Onset  . Healthy Mother   . Hypertension Father   . Diabetes Father   . Heart disease Father   . Sarcoidosis Father   . Healthy Brother   . Obesity Brother   . Cancer Paternal Grandfather        ?prostate   . Hypertension Other      Prior to Admission medications   Medication Sig Start Date End Date Taking? Authorizing Provider  amphetamine-dextroamphetamine (ADDERALL XR) 25 MG 24 hr capsule Take 1 capsule by mouth every morning. 06/30/19   Zena Amos, MD  amphetamine-dextroamphetamine (ADDERALL XR) 25 MG 24 hr capsule Take 1 capsule by mouth every morning. 07/30/19   Zena Amos, MD  cyclobenzaprine (FLEXERIL) 10 MG tablet Take 1/2 pill to 1 pill at bedtime as needed 06/10/19   Huston Foley, MD  Insulin Degludec (TRESIBA) 100 UNIT/ML SOLN Inject 40 Units into the skin.    [provider]  lisinopril (PRINIVIL,ZESTRIL) 2.5 MG tablet Take 2.5 mg by mouth daily.    [provider]  LORazepam (ATIVAN) 0.5 MG tablet Take 1 tablet (0.5 mg total) by mouth 2 (two) times daily as needed for anxiety. 05/18/19   Zena Amos, MD  norelgestromin-ethinyl estradiol Burr [employer]ico) 150-35 MCG/24HR transdermal patch Place 1 patch onto the skin once a week. xulane patch    [provider]  omeprazole (PRILOSEC) 40 MG capsule Take 1 capsule (40 mg total) by mouth daily. 30 minutes before food 02/03/19   McLean-Scocuzza, Pasty Spillers, MD  ondansetron (ZOFRAN) 4 MG tablet Take 1 tablet (4 mg total) by mouth every 8 (eight) hours as needed for nausea or vomiting. 07/06/19   Huston Foley, MD  ONE TOUCH ULTRA TEST test strip TEST BLOOD SUGAR QID 05/19/18   [provider]  rizatriptan (MAXALT-MLT) 10 MG disintegrating tablet Take 1 tablet (10 mg total) by mouth as needed for migraine. May repeat in 2  hours if needed 06/10/19   Huston Foley, MD  Semaglutide,0.25 or 0.5MG /DOS, (OZEMPIC, 0.25 OR 0.5 MG/DOSE,) 2 MG/1.5ML SOPN Inject into the skin once a week.    [provider]  sertraline (ZOLOFT) 50 MG tablet Take 1 tablet (50 mg total) by mouth daily. 06/30/19   Zena Amos, MD  spironolactone (ALDACTONE) 100 MG tablet Take 100 mg by mouth daily.    [provider]    Physical Exam: Vitals:   07/19/19 1215 07/19/19 1215 07/19/19 1602 07/19/19 1807  BP: (!) 156/99 (!) 156/99 128/81 123/90  Pulse: (!) 114 (!) 112 92 (!) 106  Resp:  18  16  Temp:   98.9 F (37.2 C) 97.7 F (36.5 C)  TempSrc:   Oral Oral  SpO2: 100% 99% 100% 99%  Weight:      Height:       General: Not in acute distress HEENT:       Eyes: PERRL, EOMI, no scleral icterus.       ENT: No discharge from the ears and nose, no pharynx injection, no tonsillar enlargement.        Neck: No JVD, no bruit, no mass  felt. Heme: No neck lymph node enlargement. Cardiac: S1/S2, RRR, No murmurs, No gallops or rubs. Respiratory: No rales, wheezing, rhonchi or rubs. GI: Soft, nondistended, has tenderness in epigastric area, no rebound pain, no organomegaly, BS present. GU: No hematuria Ext: No pitting leg edema bilaterally. 2+DP/PT pulse bilaterally. Musculoskeletal: No joint deformities, No joint redness or warmth, no limitation of ROM in spin. Skin: No rashes.  Neuro: Alert, oriented X3, cranial nerves II-XII grossly intact, moves all extremities normally. Psych: Patient is not psychotic, no suicidal or hemocidal ideation.  Labs on Admission: I have personally reviewed following labs and imaging studies  CBC: Recent Labs  Lab 07/19/19 1022  WBC 13.1*  NEUTROABS 9.2*  HGB 12.7  HCT 36.9  MCV 75.8*  PLT 368   Basic Metabolic Panel: Recent Labs  Lab 07/19/19 1022  NA 133*  K 3.8  CL 103  CO2 18*  GLUCOSE 285*  BUN 9  CREATININE 0.71  CALCIUM 9.0   GFR: Estimated Creatinine Clearance: 100.7  mL/min (by C-G formula based on SCr of 0.71 mg/dL). Liver Function Tests: Recent Labs  Lab 07/19/19 1022  AST 25  ALT 42  ALKPHOS 172*  BILITOT 0.5  PROT 8.8*  ALBUMIN 4.1   Recent Labs  Lab 07/19/19 1022  LIPASE 41   No results for input(s): AMMONIA in the last 168 hours. Coagulation Profile: No results for input(s): INR, PROTIME in the last 168 hours. Cardiac Enzymes: No results for input(s): CKTOTAL, CKMB, CKMBINDEX, TROPONINI in the last 168 hours. BNP (last 3 results) No results for input(s): PROBNP in the last 8760 hours. HbA1C: Recent Labs    07/19/19 1614  HGBA1C 8.9*   CBG: Recent Labs  Lab 07/19/19 1624 07/19/19 1724  GLUCAP 277* 226*   Lipid Profile: No results for input(s): CHOL, HDL, LDLCALC, TRIG, CHOLHDL, LDLDIRECT in the last 72 hours. Thyroid Function Tests: No results for input(s): TSH, T4TOTAL, FREET4, T3FREE, THYROIDAB in the last 72 hours. Anemia Panel: No results for input(s): VITAMINB12, FOLATE, FERRITIN, TIBC, IRON, RETICCTPCT in the last 72 hours. Urine analysis:    Component Value Date/Time   COLORURINE STRAW (A) 07/19/2019 1217   APPEARANCEUR CLEAR (A) 07/19/2019 1217   APPEARANCEUR Clear 02/26/2019 0826   LABSPEC 1.015 07/19/2019 1217   PHURINE 7.0 07/19/2019 1217   GLUCOSEU >=500 (A) 07/19/2019 1217   HGBUR NEGATIVE 07/19/2019 1217   BILIRUBINUR NEGATIVE 07/19/2019 1217   BILIRUBINUR Negative 02/26/2019 0826   KETONESUR 5 (A) 07/19/2019 1217   PROTEINUR NEGATIVE 07/19/2019 1217   NITRITE NEGATIVE 07/19/2019 1217   LEUKOCYTESUR NEGATIVE 07/19/2019 1217   Sepsis Labs: @LABRCNTIP (procalcitonin:4,lacticidven:4) )No results found for this or any previous visit (from the past 240 hour(s)).   Radiological Exams on Admission: CT ABDOMEN PELVIS W CONTRAST  Result Date: 07/19/2019 CLINICAL DATA:  Epigastric abdominal pain radiating to the back EXAM: CT ABDOMEN AND PELVIS WITH CONTRAST TECHNIQUE: Multidetector CT imaging of the  abdomen and pelvis was performed using the standard protocol following bolus administration of intravenous contrast. CONTRAST:  100mL OMNIPAQUE IOHEXOL 300 MG/ML  SOLN COMPARISON:  None. FINDINGS: Lower chest:  No contributory findings. Hepatobiliary: No focal liver abnormality.No evidence of biliary obstruction or stone. Pancreas: Unremarkable. Spleen: Unremarkable. Adrenals/Urinary Tract: Negative adrenals. No hydronephrosis or stone. Unremarkable bladder. Stomach/Bowel: No obstruction. No appendicitis. Moderate formed stool mainly in the distal colon. Vascular/Lymphatic: No acute vascular abnormality. No mass or adenopathy. Reproductive:No pathologic findings. Corpus luteum on the left and potentially right. Other: No ascites or pneumoperitoneum.  Borderline skin thickening over the mons pubis and left groin but no collection or subcutaneous stranding and no correlation with the history. Musculoskeletal: No acute abnormalities. IMPRESSION: No explanation for epigastric pain. Electronically Signed   By: Monte Fantasia M.D.   On: 07/19/2019 13:33   US Abdomen Limited RUQ  Result Date: 07/19/2019 CLINICAL DATA:  Upper abdominal pain. EXAM: ULTRASOUND ABDOMEN LIMITED RIGHT UPPER QUADRANT COMPARISON:  Abdominal ultrasound dated March 11, 2019. FINDINGS: Gallbladder: Multiple small gallstones and sludge again noted. No wall thickening visualized. No sonographic Murphy sign noted by sonographer. Common bile duct: Diameter: 6 mm, normal. Liver: No focal lesion identified. Within normal limits in parenchymal echogenicity. Portal vein is patent on color Doppler imaging with normal direction of blood flow towards the liver. Other: None. IMPRESSION: 1. Cholelithiasis and sludge without sonographic evidence of acute cholecystitis. Electronically Signed   By: Titus Dubin M.D.   On: 07/19/2019 11:52     EKG:  Not done in ED, will get one.   Assessment/Plan Principal Problem:   Abdominal pain Active  Problems:   Diabetes mellitus without complication ([facility])   GERD (gastroesophageal reflux disease)   Depression with anxiety   Attention deficit hyperactivity disorder (ADHD), predominantly inattentive type   Leukocytosis   Intractable nausea and vomiting   HTN (hypertension)   Abdominal pain and intractable nausea and vomiting: Lipase normal. CT abdomen/pelvis is negative.  Right upper quadrant ultrasound showed cholelithiasis without evidence of cholecystitis.  Potential differential diagnosis is gastroparesis due to poorly controlled diabetes.  -Placed on MedSurg [facility] for observation -IV fluid -Reglan, as needed Zofran, as needed Dilaudid  Diabetes mellitus without complication ([place]): Last A1c 10.0, poorly controled. Patient is taking Tyler Aas, Ozempic at home -Lantus 50 units daily -SSI  HTN:  -Continue home medications: Lisinopril -hold spironolactone -hydralazine prn  GERD (gastroesophageal reflux disease): -PPI  Depression with anxiety and ADHS: no SI or HI -Continue home medications  Leukocytosis: no signs of infection. Likely due to stress -follow up by CBC    DVT ppx: SQ Lovenox  Code Status: Full code Family Communication: None at bed side.       Disposition Plan:  Anticipate discharge back to previous home environment Consults called:  none Admission status: Med-surg bed for obs    Date of Service 07/19/2019    [facility]   If 7PM-7AM, please contact night-coverage [email or web address] Password [facility] 07/19/2019, 7:31 PM

## 2019-07-19 NOTE — ED Provider Notes (Signed)
[facility] Emergency Department Provider Note   ____________________________________________    I have reviewed the triage vital signs and the nursing notes.   HISTORY  Chief Complaint Abdominal Pain and Back Pain     HPI Dawn Ortiz is a 28 y.o. female with history of diabetes, GERD who presents with complaints of abdominal pain with radiation to her mid to lower back.  She describes epigastric pain which is moderate to severe and started this morning at approximately 730 has been constant.  She is never had this pain before.  She has been vomiting, nonbilious nonbloody.  No history of abdominal surgeries.  Drink 2 glasses of wine last night otherwise unremarkable night.  No fevers or chills.  Has not take anything for this  Past Medical History:  Diagnosis Date  . Anxiety   . Diabetes mellitus without complication (HCC)   . GERD (gastroesophageal reflux disease)     Patient Active Problem List   Diagnosis Date Noted  . Abdominal pain 07/19/2019  . Leukocytosis 07/19/2019  . Attention deficit hyperactivity disorder (ADHD), 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  . Depression with anxiety 05/18/2019  . Migraine without status migrainosus, not intractable 04/29/2019  . Abscess of left thigh   . Type 2 diabetes mellitus with hyperglycemia, with long-term current use of insulin (HCC) 02/06/2019  . Anxiety   . Diabetes mellitus without complication (HCC)   . GERD (gastroesophageal reflux disease)   . Abscess of right thigh   . Abscess of left breast   . Abscess of right breast   . Axillary abscess 07/02/2016    Past Surgical History:  Procedure Laterality Date  . I & D EXTREMITY Left 03/05/2019   Procedure: Irrigation and debridement;  Surgeon: Henrene Dodge, MD;  Location: [facility] ORS;  Service: General;  Laterality: Left;  . INCISION AND DRAINAGE     right breast x1 and left groin x1 and left axilla  . INCISION AND DRAINAGE ABSCESS Left  07/02/2016   Procedure: INCISION AND DRAINAGE ABSCESS;  Surgeon: Lattie Haw, MD;  Location: [facility] OR[facility];  Service: General;  Laterality: Left;  . INCISION AND DRAINAGE ABSCESS Left 04/18/2017   Procedure: INCISION AND DRAINAGE ABSCESS-LEFT GROIN;  Surgeon: Ancil Linsey, MD;  Location: [facility] OR[facility];  Service: General;  Laterality: Left;  . INCISION AND DRAINAGE ABSCESS Right 05/21/2018   Procedure: INCISION AND DRAINAGE ABSCESS- RIGHT THIGH;  Surgeon: Henrene Dodge, MD;  Location: [facility];  Service: General;  Laterality: Right;  . IRRIGATION AND DEBRIDEMENT ABSCESS Right 04/09/2018   Procedure: IRRIGATION AND DEBRIDEMENT ABSCESS;  Surgeon: Henrene Dodge, MD;  Location: [facility];  Service: General;  Laterality: Right;  . IRRIGATION AND DEBRIDEMENT ABSCESS Left 05/21/2018   Procedure: IRRIGATION AND DEBRIDEMENT BREAST ABSCESS;  Surgeon: Henrene Dodge, MD;  Location: [facility];  Service: General;  Laterality: Left;  . PILONIDAL CYST EXCISION  12/03/2014   Procedure: CYST EXCISION PILONIDAL EXTENSIVE;  Surgeon: Duwaine Maxin, MD;  Location: [facility];  Service: General;;    Prior to Admission medications   Medication Sig Start Date End Date Taking? Authorizing Provider  amphetamine-dextroamphetamine (ADDERALL XR) 25 MG 24 hr capsule Take 1 capsule by mouth every morning. 06/30/19  Yes Zena Amos, MD  cyclobenzaprine (FLEXERIL) 10 MG tablet Take 1/2 pill to 1 pill at bedtime as needed 06/10/19  Yes Huston Foley, MD  Insulin Degludec (TRESIBA) 100 UNIT/ML SOLN Inject 70 Units into the skin daily.    Yes [provider]  lisinopril (PRINIVIL,ZESTRIL) 2.5 MG tablet Take 2.5 mg by mouth daily.   Yes [provider]  LORazepam (ATIVAN) 0.5 MG tablet Take 1 tablet (0.5 mg total) by mouth 2 (two) times daily as needed for anxiety. 05/18/19  Yes Zena AmosKaur, Mandeep, MD  metFORMIN (GLUCOPHAGE-XR) 500 MG 24 hr tablet Take 500 mg by mouth 2 (two) times daily. 07/03/19  Yes [provider]    norelgestromin-ethinyl estradiol Burr Medico(XULANE) 150-35 MCG/24HR transdermal patch Place 1 patch onto the skin once a week. xulane patch   Yes [provider]  omeprazole (PRILOSEC) 40 MG capsule Take 1 capsule (40 mg total) by mouth daily. 30 minutes before food 02/03/19  Yes McLean-Scocuzza, Pasty Spillersracy N, MD  ondansetron (ZOFRAN) 4 MG tablet Take 1 tablet (4 mg total) by mouth every 8 (eight) hours as needed for nausea or vomiting. 07/06/19  Yes Huston FoleyAthar, Saima, MD  rizatriptan (MAXALT-MLT) 10 MG disintegrating tablet Take 1 tablet (10 mg total) by mouth as needed for migraine. May repeat in 2 hours if needed 06/10/19  Yes Huston FoleyAthar, Saima, MD  Semaglutide,0.25 or 0.5MG /DOS, (OZEMPIC, 0.25 OR 0.5 MG/DOSE,) 2 MG/1.5ML SOPN Inject into the skin once a week.   Yes [provider]  sertraline (ZOLOFT) 50 MG tablet Take 1 tablet (50 mg total) by mouth daily. 06/30/19  Yes Zena AmosKaur, Mandeep, MD  spironolactone (ALDACTONE) 100 MG tablet Take 100 mg by mouth daily.   Yes [provider]     Allergies Sulfa antibiotics  Family History  Problem Relation Age of Onset  . Healthy Mother   . Hypertension Father   . Diabetes Father   . Heart disease Father   . Sarcoidosis Father   . Healthy Brother   . Obesity Brother   . Cancer Paternal Grandfather        ?prostate   . Hypertension Other     Social History Social History   Tobacco Use  . Smoking status: Former Smoker    Types: Cigarettes  . Smokeless tobacco: Never Used  Substance Use Topics  . Alcohol use: Yes    Alcohol/week: 1.0 - 2.0 standard drinks    Types: 1 - 2 Glasses of wine per week    Comment: ocassional  . Drug use: No    Review of Systems  Constitutional: No fever/chills Eyes: No visual changes.  ENT: No sore throat. Cardiovascular: Denies chest pain. Respiratory: Denies shortness of breath. Gastrointestinal: As above Genitourinary: Negative for dysuria. Musculoskeletal: Negative for back pain. Skin: Negative  for rash. Neurological: Negative for headaches    ____________________________________________   PHYSICAL EXAM:  VITAL SIGNS: ED Triage Vitals  Enc Vitals Group     BP --      Pulse Rate 07/19/19 0957 (!) 118     Resp 07/19/19 0957 (!) 24     Temp 07/19/19 0957 98.3 F (36.8 C)     Temp Source 07/19/19 0957 Oral     SpO2 07/19/19 0957 100 %     Weight 07/19/19 0952 77.1 kg (170 lb)     Height 07/19/19 0952 1.575 m (5\' 2" )     Head Circumference --      Peak Flow --      Pain Score 07/19/19 0952 10     Pain Loc --      Pain Edu? --      Excl. in GC? --     Constitutional: Alert and oriented.  Standing up leaning over on bed  Nose: No congestion/rhinnorhea. Mouth/Throat: Mucous  membranes are moist.    Cardiovascular: Normal rate, regular rhythm. Good peripheral circulation. Respiratory: Normal respiratory effort.  No retractions.  Gastrointestinal: Epigastric and right upper quadrant tenderness palpation, no distention, no CVA tenderness Genitourinary: deferred Musculoskeletal: .  Warm and well perfused Neurologic:  Normal speech and language. No gross focal neurologic deficits are appreciated.  Skin:  Skin is warm, dry and intact. No rash noted. Psychiatric: Mood and affect are normal. Speech and behavior are normal.  ____________________________________________   LABS (all labs ordered are listed, but only abnormal results are displayed)  Labs Reviewed  CBC WITH DIFFERENTIAL/PLATELET - Abnormal; Notable for the following components:      Result Value   WBC 13.1 (*)    MCV 75.8 (*)    Neutro Abs 9.2 (*)    All other components within normal limits  COMPREHENSIVE METABOLIC PANEL - Abnormal; Notable for the following components:   Sodium 133 (*)    CO2 18 (*)    Glucose, Bld 285 (*)    Total Protein 8.8 (*)    Alkaline Phosphatase 172 (*)    All other components within normal limits  URINALYSIS, COMPLETE (UACMP) WITH MICROSCOPIC - Abnormal; Notable for the  following components:   Color, Urine STRAW (*)    APPearance CLEAR (*)    Glucose, UA >=500 (*)    Ketones, ur 5 (*)    Bacteria, UA RARE (*)    All other components within normal limits  CULTURE, BLOOD (ROUTINE X 2)  CULTURE, BLOOD (ROUTINE X 2)  SARS CORONAVIRUS 2 (TAT 6-24 HRS)  LIPASE, BLOOD  HEMOGLOBIN A1C  LACTIC ACID, PLASMA  POCT PREGNANCY, URINE  POCT GASTRIC OCCULT BLOOD (1-CARD TO LAB)   ____________________________________________  EKG  None ____________________________________________  RADIOLOGY  Ultrasound CT abdomen pelvis ____________________________________________   PROCEDURES  Procedure(s) performed: No  Procedures   Critical Care performed: No ____________________________________________   INITIAL IMPRESSION / ASSESSMENT AND PLAN / ED COURSE  Pertinent labs & imaging results that were available during my care of the patient were reviewed by me and considered in my medical decision making (see chart for details).  Patient presents with epigastric pain radiating to the back, differential includes pancreatitis, gastritis, perforated gastric ulcer, PUD, cholecystitis.  Will give IV fluids, IV morphine, IV Zofran, obtain labs and reevaluate.  Ultrasound shows cholelithiasis but otherwise no evidence of cholecystitis.  Sent for CT scan, requiring Dilaudid for pain  CT scan unremarkable.  Given overall reassuring imaging suspect gastroparesis, patient continues to have intractable nausea vomiting and pain, have consulted hospitalist for admission    ____________________________________________   FINAL CLINICAL IMPRESSION(S) / ED DIAGNOSES  Final diagnoses:  Upper abdominal pain  Intractable nausea and vomiting        Note:  This document was prepared using Dragon voice recognition software and may include unintentional dictation errors.   Lavonia Drafts, MD 07/19/19 [phone]

## 2019-07-19 NOTE — ED Triage Notes (Signed)
Pt reports at 7:30 this am awoke with severe lower back pain and abd pain. Pt tearful in triage. Pt reports EMS suggested that she get checked out.

## 2019-07-19 NOTE — ED Notes (Signed)
Pt sleeping at this time.

## 2019-07-20 ENCOUNTER — Encounter: Payer: Self-pay | Admitting: Internal Medicine

## 2019-07-20 ENCOUNTER — Other Ambulatory Visit: Payer: Self-pay

## 2019-07-20 DIAGNOSIS — G43909 Migraine, unspecified, not intractable, without status migrainosus: Secondary | ICD-10-CM | POA: Diagnosis present

## 2019-07-20 DIAGNOSIS — K8050 Calculus of bile duct without cholangitis or cholecystitis without obstruction: Secondary | ICD-10-CM

## 2019-07-20 DIAGNOSIS — K219 Gastro-esophageal reflux disease without esophagitis: Secondary | ICD-10-CM | POA: Diagnosis present

## 2019-07-20 DIAGNOSIS — F900 Attention-deficit hyperactivity disorder, predominantly inattentive type: Secondary | ICD-10-CM | POA: Diagnosis present

## 2019-07-20 DIAGNOSIS — R112 Nausea with vomiting, unspecified: Secondary | ICD-10-CM

## 2019-07-20 DIAGNOSIS — Z87891 Personal history of nicotine dependence: Secondary | ICD-10-CM | POA: Diagnosis not present

## 2019-07-20 DIAGNOSIS — E119 Type 2 diabetes mellitus without complications: Secondary | ICD-10-CM | POA: Diagnosis not present

## 2019-07-20 DIAGNOSIS — Z809 Family history of malignant neoplasm, unspecified: Secondary | ICD-10-CM | POA: Diagnosis not present

## 2019-07-20 DIAGNOSIS — Z20828 Contact with and (suspected) exposure to other viral communicable diseases: Secondary | ICD-10-CM | POA: Diagnosis present

## 2019-07-20 DIAGNOSIS — Z8249 Family history of ischemic heart disease and other diseases of the circulatory system: Secondary | ICD-10-CM | POA: Diagnosis not present

## 2019-07-20 DIAGNOSIS — F418 Other specified anxiety disorders: Secondary | ICD-10-CM | POA: Diagnosis present

## 2019-07-20 DIAGNOSIS — I10 Essential (primary) hypertension: Secondary | ICD-10-CM

## 2019-07-20 DIAGNOSIS — K821 Hydrops of gallbladder: Secondary | ICD-10-CM | POA: Diagnosis present

## 2019-07-20 DIAGNOSIS — Z882 Allergy status to sulfonamides status: Secondary | ICD-10-CM | POA: Diagnosis not present

## 2019-07-20 DIAGNOSIS — K810 Acute cholecystitis: Secondary | ICD-10-CM | POA: Diagnosis not present

## 2019-07-20 DIAGNOSIS — Z794 Long term (current) use of insulin: Secondary | ICD-10-CM | POA: Diagnosis not present

## 2019-07-20 DIAGNOSIS — E1165 Type 2 diabetes mellitus with hyperglycemia: Secondary | ICD-10-CM | POA: Diagnosis not present

## 2019-07-20 DIAGNOSIS — R109 Unspecified abdominal pain: Secondary | ICD-10-CM | POA: Diagnosis not present

## 2019-07-20 DIAGNOSIS — R1013 Epigastric pain: Secondary | ICD-10-CM | POA: Diagnosis present

## 2019-07-20 DIAGNOSIS — K8000 Calculus of gallbladder with acute cholecystitis without obstruction: Secondary | ICD-10-CM | POA: Diagnosis present

## 2019-07-20 DIAGNOSIS — Z833 Family history of diabetes mellitus: Secondary | ICD-10-CM | POA: Diagnosis not present

## 2019-07-20 LAB — GLUCOSE, CAPILLARY
Glucose-Capillary: 146 mg/dL — ABNORMAL HIGH (ref 70–99)
Glucose-Capillary: 132 mg/dL — ABNORMAL HIGH (ref 70–99)
Glucose-Capillary: 128 mg/dL — ABNORMAL HIGH (ref 70–99)
Glucose-Capillary: 166 mg/dL — ABNORMAL HIGH (ref 70–99)
Glucose-Capillary: 134 mg/dL — ABNORMAL HIGH (ref 70–99)
Glucose-Capillary: 137 mg/dL — ABNORMAL HIGH (ref 70–99)

## 2019-07-20 LAB — CBC
HCT: 33.4 % — ABNORMAL LOW (ref 36.0–46.0)
Hemoglobin: 11.7 g/dL — ABNORMAL LOW (ref 12.0–15.0)
MCH: 26.4 pg (ref 26.0–34.0)
MCHC: 35 g/dL (ref 30.0–36.0)
MCV: 75.4 fL — ABNORMAL LOW (ref 80.0–100.0)
Platelets: 354 10*3/uL (ref 150–400)
RBC: 4.43 MIL/uL (ref 3.87–5.11)
RDW: 12.9 % (ref 11.5–15.5)
WBC: 14.3 10*3/uL — ABNORMAL HIGH (ref 4.0–10.5)
nRBC: 0 % (ref 0.0–0.2)

## 2019-07-20 LAB — BASIC METABOLIC PANEL
Anion gap: 10 (ref 5–15)
BUN: 7 mg/dL (ref 6–20)
CO2: 24 mmol/L (ref 22–32)
Calcium: 9.1 mg/dL (ref 8.9–10.3)
Chloride: 104 mmol/L (ref 98–111)
Creatinine, Ser: 0.55 mg/dL (ref 0.44–1.00)
GFR calc Af Amer: >60 mL/min (ref >60)
GFR calc non Af Amer: >60 mL/min (ref >60)
Glucose, Bld: 133 mg/dL — ABNORMAL HIGH (ref 70–99)
Potassium: 3.8 mmol/L (ref 3.5–5.1)
Sodium: 138 mmol/L (ref 135–145)

## 2019-07-20 MED ORDER — ONDANSETRON HCL 4 MG/2ML IJ SOLN
4.0000 mg | Freq: Four times a day (QID) | INTRAMUSCULAR | Status: DC | PRN
  Administered 2019-07-20 – 2019-07-21 (×4): 4 mg via INTRAVENOUS
  Filled 2019-07-20 (×3): qty 2

## 2019-07-20 MED ORDER — HYDROMORPHONE HCL 1 MG/ML IJ SOLN
1.0000 mg | Freq: Four times a day (QID) | INTRAMUSCULAR | Status: DC | PRN
  Administered 2019-07-20 – 2019-07-22 (×4): 1 mg via INTRAVENOUS
  Filled 2019-07-20 (×4): qty 1

## 2019-07-20 MED ORDER — INSULIN ASPART 100 UNIT/ML ~~LOC~~ SOLN
0.0000 [IU] | Freq: Three times a day (TID) | SUBCUTANEOUS | Status: DC
  Administered 2019-07-20: 2 [IU] via SUBCUTANEOUS
  Administered 2019-07-21 – 2019-07-22 (×3): 3 [IU] via SUBCUTANEOUS
  Filled 2019-07-20 (×4): qty 1

## 2019-07-20 MED ORDER — INDOCYANINE GREEN 25 MG IV SOLR
5.0000 mg | Freq: Once | INTRAVENOUS | Status: AC
  Administered 2019-07-21: 5 mg via INTRAVENOUS
  Filled 2019-07-20 (×2): qty 25
  Filled 2019-07-20: qty 5

## 2019-07-20 MED ORDER — POLYETHYLENE GLYCOL 3350 17 G PO PACK
17.0000 g | PACK | Freq: Two times a day (BID) | ORAL | Status: DC
  Administered 2019-07-20 – 2019-07-22 (×4): 17 g via ORAL
  Filled 2019-07-20 (×4): qty 1

## 2019-07-20 MED ORDER — SPIRONOLACTONE 25 MG PO TABS
100.0000 mg | ORAL_TABLET | Freq: Every day | ORAL | Status: DC
  Administered 2019-07-20 – 2019-07-22 (×2): 100 mg via ORAL
  Filled 2019-07-20 (×2): qty 4

## 2019-07-20 MED ORDER — BISACODYL 10 MG RE SUPP
10.0000 mg | Freq: Every day | RECTAL | Status: DC
  Administered 2019-07-20 – 2019-07-22 (×3): 10 mg via RECTAL
  Filled 2019-07-20 (×3): qty 1

## 2019-07-20 MED ORDER — ONDANSETRON HCL 4 MG/2ML IJ SOLN
INTRAMUSCULAR | Status: AC
  Filled 2019-07-20: qty 2

## 2019-07-20 MED ORDER — SODIUM CHLORIDE 0.9 % IV SOLN
2.0000 g | INTRAVENOUS | Status: DC
  Administered 2019-07-20 – 2019-07-21 (×2): 2 g via INTRAVENOUS
  Filled 2019-07-20 (×2): qty 2
  Filled 2019-07-20: qty 20

## 2019-07-20 NOTE — Consult Note (Signed)
Patient ID: Dawn Ortiz, female   DOB: 07-Dec-1990, 28 y.o.   MRN: [id number]  HPI Dawn Ortiz is a 28 y.o. female seen in consultation at the request of Dr. Allena Katz for biliary colic.  She was admitted yesterday from the emergency room with nausea and abdominal pain.  The patient reports the pain is intermittent is in the epigastric area and goes to both left and right upper quadrants and radiates to the back.  The pain is moderate intensity and is dull in nature.  She also has had intractable nausea and vomiting.  She has had at least 5 I&D by our group group in the past.  She does have a history of diabetes and anxiety.  Further work-up included an ultrasound and CT scan that I have personally reviewed showing evidence of gallstones.  No evidence of cholecystitis.  No evidence of any other intra-abdominal pathology.  She does have an increase in the white count to 14.3 she has abnormal bilirubin and had a chronically mildly elevated alkaline phosphatase. Her mother had also history of biliary colic and responded well to cholecystectomy.  She is able to perform more than 4 METS of activity without any shortness of breath or chest pain  HPI  Past Medical History:  Diagnosis Date  . Anxiety   . Diabetes mellitus without complication (HCC)   . GERD (gastroesophageal reflux disease)     Past Surgical History:  Procedure Laterality Date  . I & D EXTREMITY Left 03/05/2019   Procedure: Irrigation and debridement;  Surgeon: Henrene Dodge, MD;  Location: [facility];  Service: General;  Laterality: Left;  . INCISION AND DRAINAGE     right breast x1 and left groin x1 and left axilla  . INCISION AND DRAINAGE ABSCESS Left 07/02/2016   Procedure: INCISION AND DRAINAGE ABSCESS;  Surgeon: Lattie Haw, MD;  Location: [facility];  Service: General;  Laterality: Left;  . INCISION AND DRAINAGE ABSCESS Left 04/18/2017   Procedure: INCISION AND DRAINAGE ABSCESS-LEFT GROIN;  Surgeon: Ancil Linsey, MD;   Location: [facility];  Service: General;  Laterality: Left;  . INCISION AND DRAINAGE ABSCESS Right 05/21/2018   Procedure: INCISION AND DRAINAGE ABSCESS- RIGHT THIGH;  Surgeon: Henrene Dodge, MD;  Location: [facility];  Service: General;  Laterality: Right;  . IRRIGATION AND DEBRIDEMENT ABSCESS Right 04/09/2018   Procedure: IRRIGATION AND DEBRIDEMENT ABSCESS;  Surgeon: Henrene Dodge, MD;  Location: [facility];  Service: General;  Laterality: Right;  . IRRIGATION AND DEBRIDEMENT ABSCESS Left 05/21/2018   Procedure: IRRIGATION AND DEBRIDEMENT BREAST ABSCESS;  Surgeon: Henrene Dodge, MD;  Location: [facility];  Service: General;  Laterality: Left;  . PILONIDAL CYST EXCISION  12/03/2014   Procedure: CYST EXCISION PILONIDAL EXTENSIVE;  Surgeon: Duwaine Maxin, MD;  Location: [facility];  Service: General;;    Family History  Problem Relation Age of Onset  . Healthy Mother   . Hypertension Father   . Diabetes Father   . Heart disease Father   . Sarcoidosis Father   . Healthy Brother   . Obesity Brother   . Cancer Paternal Grandfather        ?prostate   . Hypertension Other     Social History Social History   Tobacco Use  . Smoking status: Former Smoker    Types: Cigarettes  . Smokeless tobacco: Never Used  Substance Use Topics  . Alcohol use: Yes    Alcohol/week: 1.0 - 2.0 standard drinks    Types: 1 - 2  Glasses of wine per week    Comment: ocassional  . Drug use: No    Allergies  Allergen Reactions  . Sulfa Antibiotics Hives    Current Facility-Administered Medications  Medication Dose Route Frequency Provider Last Rate Last Admin  . 0.9 %  sodium chloride infusion   Intravenous Continuous Enedina FinnerPatel, Sona, MD 50 mL/hr at 07/20/19 1337 Rate Change at 07/20/19 1337  . acetaminophen (TYLENOL) tablet 650 mg  650 mg Oral Q6H PRN Lorretta HarpNiu, Xilin, MD   650 mg at 07/20/19 1335   Or  . acetaminophen (TYLENOL) suppository 650 mg  650 mg Rectal Q6H PRN Lorretta HarpNiu, Xilin, MD      .  amphetamine-dextroamphetamine (ADDERALL XR) 24 hr capsule 25 mg  25 mg Oral Teddy [id number] Niu, Brien FewXilin, MD   25 mg at 07/20/19 1011  . bisacodyl (DULCOLAX) suppository 10 mg  10 mg Rectal Daily Enedina FinnerPatel, Sona, MD   10 mg at 07/20/19 1009  . cefTRIAXone (ROCEPHIN) 2 g in sodium chloride 0.9 % 100 mL IVPB  2 g Intravenous Q24H Caraline Deutschman F, MD      . cyclobenzaprine (FLEXERIL) tablet 5 mg  5 mg Oral TID PRN Lorretta HarpNiu, Xilin, MD      . enoxaparin (LOVENOX) injection 40 mg  40 mg Subcutaneous Q24H Lorretta HarpNiu, Xilin, MD   40 mg at 07/19/19 2113  . hydrALAZINE (APRESOLINE) tablet 25 mg  25 mg Oral TID PRN Lorretta HarpNiu, Xilin, MD      . Melene Muller[START ON 07/21/2019] indocyanine green (IC-GREEN) injection 5 mg  5 mg Intravenous Once Alahni Varone F, MD      . insulin aspart (novoLOG) injection 0-15 Units  0-15 Units Subcutaneous Q4H Lorretta HarpNiu, Xilin, MD   3 Units at 07/20/19 1224  . insulin glargine (LANTUS) injection 50 Units  50 Units Subcutaneous q morning - 10a Lorretta HarpNiu, Xilin, MD      . lisinopril (ZESTRIL) tablet 2.5 mg  2.5 mg Oral Daily Lorretta HarpNiu, Xilin, MD   2.5 mg at 07/20/19 0831  . LORazepam (ATIVAN) tablet 0.5 mg  0.5 mg Oral BID PRN Lorretta HarpNiu, Xilin, MD      . metoCLOPramide (REGLAN) injection 5 mg  5 mg Intravenous Q8H Lorretta HarpNiu, Xilin, MD   5 mg at 07/20/19 1327  . ondansetron (ZOFRAN) 4 MG/2ML injection           . ondansetron (ZOFRAN) injection 4 mg  4 mg Intravenous Q6H PRN Manuela SchwartzMorrison, Brenda, NP   4 mg at 07/20/19 0842  . pantoprazole (PROTONIX) EC tablet 40 mg  40 mg Oral Daily Lorretta HarpNiu, Xilin, MD   40 mg at 07/20/19 0831  . polyethylene glycol (MIRALAX / GLYCOLAX) packet 17 g  17 g Oral BID Enedina FinnerPatel, Sona, MD   17 g at 07/20/19 1008  . sertraline (ZOLOFT) tablet 50 mg  50 mg Oral Daily Lorretta HarpNiu, Xilin, MD   50 mg at 07/20/19 [id number]     Review of Systems Full ROS  was asked and was negative except for the information on the HPI  Physical Exam Blood pressure (!) 131/94, pulse (!) 105, temperature 98.5 F (36.9 C), temperature source Oral, resp. rate 16, height 5'  2" (1.575 m), weight 77.1 kg, last menstrual period 07/08/2019, SpO2 100 %. CONSTITUTIONAL: NAD EYES: Pupils are equal, round, and reactive to light, Sclera are non-icteric. EARS, NOSE, MOUTH AND THROAT: The oropharynx is clear. The oral mucosa is pink and moist. Hearing is intact to voice. LYMPH NODES:  Lymph nodes in the neck are normal. RESPIRATORY:  Lungs are clear. There is normal respiratory effort, with equal breath sounds bilaterally, and without pathologic use of accessory muscles. CARDIOVASCULAR: Heart is regular without murmurs, gallops, or rubs. GI: The abdomen is  soft, moderately tender RUQ w/o peritonitis but w equivocal murphy sign. There are no palpable masses. There is no hepatosplenomegaly. There are normal bowel sounds in all quadrants. GU: Rectal deferred.   MUSCULOSKELETAL: Normal muscle strength and tone. No cyanosis or edema.   SKIN: Turgor is good and there are no pathologic skin lesions or ulcers. NEUROLOGIC: Motor and sensation is grossly normal. Cranial nerves are grossly intact. PSYCH:  Oriented to person, place and time. Affect is normal.  Data Reviewed I have personally reviewed the patient's imaging, laboratory findings and medical records.    Assessment/Plan Lyndall is a 28 year old female with signs and symptoms consistent with biliary colic versus early acute cholecystitis.  Given that she has persistent tenderness and with treat her as an episode of acute cholecystitis.  We will go ahead and start her on IV Rocephin keep her n.p.o. after midnight and perform a robotic cholecystectomy tomorrow. We will recheck CMP in am as well. I discussed the procedure in detail.  The patient was given Neurosurgeon.  We discussed the risks and benefits of a laparoscopic cholecystectomy and possible cholangiogram including, but not limited to bleeding, infection, injury to surrounding structures such as the intestine or liver, bile leak, retained gallstones, need to convert  to an open procedure, prolonged diarrhea, blood clots such as  DVT, common bile duct injury, anesthesia risks, and possible need for additional procedures.  The likelihood of improvement in symptoms and return to the patient's normal status is good. We discussed the typical post-operative recovery course.  Caroleen Hamman, MD FACS General Surgeon 07/20/2019, 1:46 PM

## 2019-07-20 NOTE — Progress Notes (Signed)
[facility]  - [place] at [facility]   PATIENT NAME: Dawn Ortiz    MR#:  [id number]  DATE OF BIRTH:  [birth year]  SUBJECTIVE:   Patient came in with abdominal pain in the epigastric area. No vomiting today. However does have epigastric abdominal pain and complains of constipation for one week. Mother in the room. No fever REVIEW OF SYSTEMS:   Review of Systems  Constitutional: Negative for chills, fever and weight loss.  HENT: Negative for ear discharge, ear pain and nosebleeds.   Eyes: Negative for blurred vision, pain and discharge.  Respiratory: Negative for sputum production, shortness of breath, wheezing and stridor.   Cardiovascular: Negative for chest pain, palpitations, orthopnea and PND.  Gastrointestinal: Positive for abdominal pain and nausea. Negative for diarrhea and vomiting.  Genitourinary: Negative for frequency and urgency.  Musculoskeletal: Positive for back pain. Negative for joint pain.  Neurological: Negative for sensory change, speech change, focal weakness and weakness.  Psychiatric/Behavioral: Negative for depression and hallucinations. The patient is not nervous/anxious.    Tolerating Diet: clear Tolerating PT: ambulatory  DRUG ALLERGIES:   Allergies  Allergen Reactions  . Sulfa Antibiotics Hives    VITALS:  Blood pressure (!) 131/94, pulse (!) 105, temperature 98.5 F (36.9 C), temperature source Oral, resp. rate 16, height 5\' 2"  (1.575 m), weight 77.1 kg, last menstrual period 07/08/2019, SpO2 100 %.  PHYSICAL EXAMINATION:   Physical Exam  GENERAL:  28 y.o.-year-old patient lying in the bed with no acute distress.  EYES: Pupils equal, round, reactive to light and accommodation. No scleral icterus. Extraocular muscles intact.  HEENT: Head atraumatic, normocephalic. Oropharynx and nasopharynx clear.  NECK:  Supple, no jugular venous distention. No thyroid enlargement, no tenderness.  LUNGS: Normal breath sounds bilaterally,  no wheezing, rales, rhonchi. No use of accessory muscles of respiration.  CARDIOVASCULAR: S1, S2 normal. No murmurs, rubs, or gallops.  ABDOMEN: Soft, mild  Epigastric tenderness, nondistended. Bowel sounds present. No organomegaly or mass.  EXTREMITIES: No cyanosis, clubbing or edema b/l.    NEUROLOGIC: Cranial nerves II through XII are intact. No focal Motor or sensory deficits b/l.   PSYCHIATRIC:  patient is alert and oriented x 3.  SKIN: No obvious rash, lesion, or ulcer.   LABORATORY PANEL:  CBC Recent Labs  Lab 07/20/19 0416  WBC 14.3*  HGB 11.7*  HCT 33.4*  PLT 354    Chemistries  Recent Labs  Lab 07/19/19 1022 07/20/19 0416  NA 133* 138  K 3.8 3.8  CL 103 104  CO2 18* 24  GLUCOSE 285* 133*  BUN 9 7  CREATININE 0.71 0.55  CALCIUM 9.0 9.1  AST 25  --   ALT 42  --   ALKPHOS 172*  --   BILITOT 0.5  --    Cardiac Enzymes No results for input(s): TROPONINI in the last 168 hours. RADIOLOGY:  CT ABDOMEN PELVIS W CONTRAST  Result Date: 07/19/2019 CLINICAL DATA:  Epigastric abdominal pain radiating to the back EXAM: CT ABDOMEN AND PELVIS WITH CONTRAST TECHNIQUE: Multidetector CT imaging of the abdomen and pelvis was performed using the standard protocol following bolus administration of intravenous contrast. CONTRAST:  07/21/2019 OMNIPAQUE IOHEXOL 300 MG/ML  SOLN COMPARISON:  None. FINDINGS: Lower chest:  No contributory findings. Hepatobiliary: No focal liver abnormality.No evidence of biliary obstruction or stone. Pancreas: Unremarkable. Spleen: Unremarkable. Adrenals/Urinary Tract: Negative adrenals. No hydronephrosis or stone. Unremarkable bladder. Stomach/Bowel: No obstruction. No appendicitis. Moderate formed stool mainly in the distal colon. Vascular/Lymphatic: No  acute vascular abnormality. No mass or adenopathy. Reproductive:No pathologic findings. Corpus luteum on the left and potentially right. Other: No ascites or pneumoperitoneum. Borderline skin thickening over the  mons pubis and left groin but no collection or subcutaneous stranding and no correlation with the history. Musculoskeletal: No acute abnormalities. IMPRESSION: No explanation for epigastric pain. Electronically Signed   By: Monte Fantasia M.D.   On: 07/19/2019 13:33   US Abdomen Limited RUQ  Result Date: 07/19/2019 CLINICAL DATA:  Upper abdominal pain. EXAM: ULTRASOUND ABDOMEN LIMITED RIGHT UPPER QUADRANT COMPARISON:  Abdominal ultrasound dated March 11, 2019. FINDINGS: Gallbladder: Multiple small gallstones and sludge again noted. No wall thickening visualized. No sonographic Murphy sign noted by sonographer. Common bile duct: Diameter: 6 mm, normal. Liver: No focal lesion identified. Within normal limits in parenchymal echogenicity. Portal vein is patent on color Doppler imaging with normal direction of blood flow towards the liver. Other: None. IMPRESSION: 1. Cholelithiasis and sludge without sonographic evidence of acute cholecystitis. Electronically Signed   By: Titus Dubin M.D.   On: 07/19/2019 11:52   ASSESSMENT AND PLAN:   Dawn Ortiz is a 28 y.o. female with medical history significant of hypertension, diabetes mellitus, GERD, depression with anxiety, ADHD, who presents with intractable nausea, vomiting, abdominal pain. Patient states that her symptoms started this morning, including intractable nausea, vomiting, epigastric abdominal pain.  He has been having many times nonbiliary nonbloody vomitus  #Abdominal pain and intractable nausea and vomiting: suspected biliary colic versus early acute cholecystitis with gallstones noted on ultrasound abdomen -Lipase normal. CT abdomen/pelvis is negative except constipation - Right upper quadrant ultrasound showed cholelithiasis without evidence of cholecystitis -patient was seen by Dr. Dahlia Byes-- plans for lap cholecystectomy tomorrow. -IV fluids -prn Reglan, as needed Zofran, as needed Dilaudid  #Diabetes mellitus without complication  ([place]):  -Last A1c 10.0, poorly controled. Patient is taking Tyler Aas, Ozempic, metformin  at home (follows with Dr Ladell Pier-- endocrinology) -Lantus 50 units daily and sliding scale here -sugars are stable  #HTN:  -Continue home medications: Lisinopril, spironolactone -hydralazine prn  #GERD (gastroesophageal reflux disease): -PPI  # Depression with anxiety and ADHS: no SI or HI -Continue home medications  #Leukocytosis: no signs of infection. Likely due to stress -follow up by CBC  DVT ppx: SQ Lovenox  Code Status: Full code Family Communication: mother at bed side.       Disposition Plan: home  consults called:   surgery   TOTAL TIME TAKING CARE OF THIS PATIENT: *30* minutes.  >50% time spent on counselling and coordination of care  POSSIBLE D/C IN *1--3* DAYS, DEPENDING ON CLINICAL CONDITION.  Note: This dictation was prepared with Dragon dictation along with smaller phrase technology. Any transcriptional errors that result from this process are unintentional.  Fritzi Mandes M.D on 07/20/2019 at 2:57 PM  Between 7am to 6pm - Pager - [phone]  After 6pm go to [email or web address]  [facility]   CC: Primary care physician; McLean-Scocuzza, Nino Glow, MDPatient ID: [name], female   DOB: 08-08-1990, 28 y.o.   MRN: [id number]

## 2019-07-20 NOTE — Progress Notes (Signed)
Md notified pt nauseated, zofran not due for 1 hour, waiting call back

## 2019-07-21 ENCOUNTER — Encounter: Payer: Self-pay | Admitting: Internal Medicine

## 2019-07-21 ENCOUNTER — Inpatient Hospital Stay: Payer: Managed Care, Other (non HMO) | Admitting: Anesthesiology

## 2019-07-21 ENCOUNTER — Encounter
Admission: EM | Disposition: A | Discharge status: Home / Self Care | Payer: Self-pay | Source: Home / Self Care | Attending: Surgery

## 2019-07-21 LAB — COMPREHENSIVE METABOLIC PANEL
ALT: 27 U/L (ref 0–44)
AST: 17 U/L (ref 15–41)
Albumin: 3.3 g/dL — ABNORMAL LOW (ref 3.5–5.0)
Alkaline Phosphatase: 135 U/L — ABNORMAL HIGH (ref 38–126)
Anion gap: 10 (ref 5–15)
BUN: 7 mg/dL (ref 6–20)
CO2: 23 mmol/L (ref 22–32)
Calcium: 9 mg/dL (ref 8.9–10.3)
Chloride: 106 mmol/L (ref 98–111)
Creatinine, Ser: 0.56 mg/dL (ref 0.44–1.00)
GFR calc Af Amer: >60 mL/min (ref >60)
GFR calc non Af Amer: >60 mL/min (ref >60)
Glucose, Bld: 107 mg/dL — ABNORMAL HIGH (ref 70–99)
Potassium: 3.6 mmol/L (ref 3.5–5.1)
Sodium: 139 mmol/L (ref 135–145)
Total Bilirubin: 0.4 mg/dL (ref 0.3–1.2)
Total Protein: 7.3 g/dL (ref 6.5–8.1)

## 2019-07-21 LAB — CBC
HCT: 35.8 % — ABNORMAL LOW (ref 36.0–46.0)
Hemoglobin: 11.8 g/dL — ABNORMAL LOW (ref 12.0–15.0)
MCH: 26.3 pg (ref 26.0–34.0)
MCHC: 33 g/dL (ref 30.0–36.0)
MCV: 79.9 fL — ABNORMAL LOW (ref 80.0–100.0)
Platelets: 329 10*3/uL (ref 150–400)
RBC: 4.48 MIL/uL (ref 3.87–5.11)
RDW: 13.2 % (ref 11.5–15.5)
WBC: 9.9 10*3/uL (ref 4.0–10.5)
nRBC: 0 % (ref 0.0–0.2)

## 2019-07-21 LAB — GLUCOSE, CAPILLARY
Glucose-Capillary: 98 mg/dL (ref 70–99)
Glucose-Capillary: 105 mg/dL — ABNORMAL HIGH (ref 70–99)
Glucose-Capillary: 100 mg/dL — ABNORMAL HIGH (ref 70–99)
Glucose-Capillary: 128 mg/dL — ABNORMAL HIGH (ref 70–99)
Glucose-Capillary: 187 mg/dL — ABNORMAL HIGH (ref 70–99)

## 2019-07-21 SURGERY — CHOLECYSTECTOMY, ROBOT-ASSISTED, LAPAROSCOPIC
Anesthesia: General

## 2019-07-21 MED ORDER — PROPOFOL 10 MG/ML IV BOLUS
INTRAVENOUS | Status: DC | PRN
  Administered 2019-07-21: 150 mg via INTRAVENOUS

## 2019-07-21 MED ORDER — BUPIVACAINE-EPINEPHRINE (PF) 0.25% -1:200000 IJ SOLN
INTRAMUSCULAR | Status: DC | PRN
  Administered 2019-07-21: 30 mL

## 2019-07-21 MED ORDER — SODIUM CHLORIDE FLUSH 0.9 % IV SOLN
INTRAVENOUS | Status: AC
  Filled 2019-07-21: qty 10

## 2019-07-21 MED ORDER — ONDANSETRON HCL 4 MG/2ML IJ SOLN: 4.0000 mg | Freq: Once | INTRAMUSCULAR | Status: DC | PRN

## 2019-07-21 MED ORDER — KETOROLAC TROMETHAMINE 30 MG/ML IJ SOLN
INTRAMUSCULAR | Status: DC | PRN
  Administered 2019-07-21: 30 mg via INTRAVENOUS

## 2019-07-21 MED ORDER — OXYCODONE HCL 5 MG PO TABS
5.0000 mg | ORAL_TABLET | ORAL | Status: DC | PRN
  Administered 2019-07-21 – 2019-07-22 (×2): 5 mg via ORAL
  Filled 2019-07-21 (×3): qty 1

## 2019-07-21 MED ORDER — KETOROLAC TROMETHAMINE 30 MG/ML IJ SOLN
30.0000 mg | Freq: Four times a day (QID) | INTRAMUSCULAR | Status: DC
  Administered 2019-07-21 – 2019-07-22 (×3): 30 mg via INTRAVENOUS
  Filled 2019-07-21 (×3): qty 1

## 2019-07-21 MED ORDER — EPINEPHRINE PF 1 MG/ML IJ SOLN
INTRAMUSCULAR | Status: AC
  Filled 2019-07-21: qty 1

## 2019-07-21 MED ORDER — ONDANSETRON HCL 4 MG/2ML IJ SOLN
INTRAMUSCULAR | Status: AC
  Filled 2019-07-21: qty 2

## 2019-07-21 MED ORDER — FENTANYL CITRATE (PF) 250 MCG/5ML IJ SOLN
INTRAMUSCULAR | Status: AC
  Filled 2019-07-21: qty 5

## 2019-07-21 MED ORDER — ONDANSETRON HCL 4 MG/2ML IJ SOLN
INTRAMUSCULAR | Status: DC | PRN
  Administered 2019-07-21: 4 mg via INTRAVENOUS

## 2019-07-21 MED ORDER — ROCURONIUM BROMIDE 100 MG/10ML IV SOLN
INTRAVENOUS | Status: DC | PRN
  Administered 2019-07-21: 10 mg via INTRAVENOUS

## 2019-07-21 MED ORDER — SUGAMMADEX SODIUM 200 MG/2ML IV SOLN
INTRAVENOUS | Status: DC | PRN
  Administered 2019-07-21: 160 mg via INTRAVENOUS

## 2019-07-21 MED ORDER — FENTANYL CITRATE (PF) 100 MCG/2ML IJ SOLN
INTRAMUSCULAR | Status: DC | PRN
  Administered 2019-07-21 (×2): 50 ug via INTRAVENOUS
  Administered 2019-07-21: 25 ug via INTRAVENOUS

## 2019-07-21 MED ORDER — PROPOFOL 10 MG/ML IV BOLUS
INTRAVENOUS | Status: AC
  Filled 2019-07-21: qty 20

## 2019-07-21 MED ORDER — LIDOCAINE HCL (CARDIAC) PF 100 MG/5ML IV SOSY
PREFILLED_SYRINGE | INTRAVENOUS | Status: DC | PRN
  Administered 2019-07-21: 100 mg via INTRAVENOUS

## 2019-07-21 MED ORDER — PHENYLEPHRINE HCL (PRESSORS) 10 MG/ML IV SOLN
INTRAVENOUS | Status: DC | PRN
  Administered 2019-07-21: 100 ug via INTRAVENOUS
  Administered 2019-07-21: 200 ug via INTRAVENOUS
  Administered 2019-07-21: 100 ug via INTRAVENOUS
  Administered 2019-07-21: 200 ug via INTRAVENOUS
  Administered 2019-07-21: 100 ug via INTRAVENOUS

## 2019-07-21 MED ORDER — FENTANYL CITRATE (PF) 100 MCG/2ML IJ SOLN
INTRAMUSCULAR | Status: AC
  Administered 2019-07-21: 25 ug via INTRAVENOUS
  Filled 2019-07-21: qty 2

## 2019-07-21 MED ORDER — MORPHINE SULFATE (PF) 2 MG/ML IV SOLN
2.0000 mg | INTRAVENOUS | Status: DC | PRN
  Administered 2019-07-21: 2 mg via INTRAVENOUS

## 2019-07-21 MED ORDER — SUGAMMADEX SODIUM 200 MG/2ML IV SOLN
INTRAVENOUS | Status: AC
  Filled 2019-07-21: qty 2

## 2019-07-21 MED ORDER — DEXAMETHASONE SODIUM PHOSPHATE 10 MG/ML IJ SOLN
INTRAMUSCULAR | Status: AC
  Filled 2019-07-21: qty 1

## 2019-07-21 MED ORDER — SUCCINYLCHOLINE CHLORIDE 20 MG/ML IJ SOLN
INTRAMUSCULAR | Status: DC | PRN
  Administered 2019-07-21: 100 mg via INTRAVENOUS

## 2019-07-21 MED ORDER — DEXAMETHASONE SODIUM PHOSPHATE 10 MG/ML IJ SOLN
INTRAMUSCULAR | Status: DC | PRN
  Administered 2019-07-21: 5 mg via INTRAVENOUS

## 2019-07-21 MED ORDER — BUPIVACAINE HCL (PF) 0.25 % IJ SOLN
INTRAMUSCULAR | Status: AC
  Filled 2019-07-21: qty 30

## 2019-07-21 MED ORDER — ROCURONIUM BROMIDE 50 MG/5ML IV SOLN
INTRAVENOUS | Status: AC
  Filled 2019-07-21: qty 1

## 2019-07-21 MED ORDER — FENTANYL CITRATE (PF) 100 MCG/2ML IJ SOLN
25.0000 ug | INTRAMUSCULAR | Status: DC | PRN
  Administered 2019-07-21 (×3): 25 ug via INTRAVENOUS

## 2019-07-21 MED ORDER — ACETAMINOPHEN 500 MG PO TABS
1000.0000 mg | ORAL_TABLET | Freq: Four times a day (QID) | ORAL | Status: DC
  Administered 2019-07-21 – 2019-07-22 (×3): 1000 mg via ORAL
  Filled 2019-07-21 (×3): qty 2

## 2019-07-21 MED ORDER — ACETAMINOPHEN 10 MG/ML IV SOLN
INTRAVENOUS | Status: AC
  Filled 2019-07-21: qty 100

## 2019-07-21 MED ORDER — MIDAZOLAM HCL 2 MG/2ML IJ SOLN
INTRAMUSCULAR | Status: AC
  Filled 2019-07-21: qty 2

## 2019-07-21 MED ORDER — MIDAZOLAM HCL 2 MG/2ML IJ SOLN
INTRAMUSCULAR | Status: DC | PRN
  Administered 2019-07-21: 2 mg via INTRAVENOUS

## 2019-07-21 MED ORDER — ACETAMINOPHEN 10 MG/ML IV SOLN
INTRAVENOUS | Status: DC | PRN
  Administered 2019-07-21: 1000 mg via INTRAVENOUS

## 2019-07-21 MED ORDER — KETOROLAC TROMETHAMINE 30 MG/ML IJ SOLN
INTRAMUSCULAR | Status: AC
  Filled 2019-07-21: qty 1

## 2019-07-21 MED ORDER — LIDOCAINE HCL (PF) 2 % IJ SOLN
INTRAMUSCULAR | Status: AC
  Filled 2019-07-21: qty 10

## 2019-07-21 SURGICAL SUPPLY — 48 items
BULB RESERV EVAC DRAIN JP 100C (MISCELLANEOUS) ×1 IMPLANT
CANISTER SUCT 1200ML W/VALVE (MISCELLANEOUS) ×2 IMPLANT
CHLORAPREP W/TINT 26 (MISCELLANEOUS) ×2 IMPLANT
CLIP VESOLOCK MED LG 6/CT (CLIP) ×2 IMPLANT
COVER WAND RF STERILE (DRAPES) ×2 IMPLANT
DECANTER SPIKE VIAL GLASS SM (MISCELLANEOUS) ×1 IMPLANT
DEFOGGER SCOPE WARMER CLEARIFY (MISCELLANEOUS) ×2 IMPLANT
DERMABOND ADVANCED (GAUZE/BANDAGES/DRESSINGS) ×1
DERMABOND ADVANCED .7 DNX12 (GAUZE/BANDAGES/DRESSINGS) ×1 IMPLANT
DRAIN CHANNEL JP 19F (MISCELLANEOUS) ×1 IMPLANT
DRAPE 3/4 80X56 (DRAPES) ×2 IMPLANT
DRAPE ARM DVNC X/XI (DISPOSABLE) ×4 IMPLANT
DRAPE COLUMN DVNC XI (DISPOSABLE) ×1 IMPLANT
DRAPE DA VINCI XI ARM (DISPOSABLE) ×4
DRAPE DA VINCI XI COLUMN (DISPOSABLE) ×1
ELECT CAUTERY BLADE 6.4 (BLADE) ×2 IMPLANT
ELECT REM PT RETURN 9FT ADLT (ELECTROSURGICAL) ×2
ELECTRODE REM PT RTRN 9FT ADLT (ELECTROSURGICAL) ×1 IMPLANT
GLOVE BIO SURGEON STRL SZ7 (GLOVE) ×4 IMPLANT
GOWN STRL REUS W/ TWL LRG LVL3 (GOWN DISPOSABLE) ×4 IMPLANT
GOWN STRL REUS W/TWL LRG LVL3 (GOWN DISPOSABLE) ×4
IRRIGATION STRYKERFLOW (MISCELLANEOUS) IMPLANT
IRRIGATOR STRYKERFLOW (MISCELLANEOUS)
IV NS 1000ML (IV SOLUTION)
IV NS 1000ML BAXH (IV SOLUTION) IMPLANT
KIT PINK PAD W/HEAD ARE REST (MISCELLANEOUS) ×2
KIT PINK PAD W/HEAD ARM REST (MISCELLANEOUS) ×1 IMPLANT
LABEL OR SOLS (LABEL) ×2 IMPLANT
NEEDLE HYPO 22GX1.5 SAFETY (NEEDLE) ×2 IMPLANT
NS IRRIG 500ML POUR BTL (IV SOLUTION) ×2 IMPLANT
OBTURATOR OPTICAL STANDARD 8MM (TROCAR) ×1
OBTURATOR OPTICAL STND 8 DVNC (TROCAR) ×1
OBTURATOR OPTICALSTD 8 DVNC (TROCAR) ×1 IMPLANT
PACK LAP CHOLECYSTECTOMY (MISCELLANEOUS) ×2 IMPLANT
PENCIL ELECTRO HAND CTR (MISCELLANEOUS) ×2 IMPLANT
POUCH SPECIMEN RETRIEVAL 10MM (ENDOMECHANICALS) ×2 IMPLANT
SEAL CANN UNIV 5-8 DVNC XI (MISCELLANEOUS) ×4 IMPLANT
SEAL XI 5MM-8MM UNIVERSAL (MISCELLANEOUS) ×4
SOLUTION ELECTROLUBE (MISCELLANEOUS) ×2 IMPLANT
SPONGE DRAIN TRACH 4X4 STRL 2S (GAUZE/BANDAGES/DRESSINGS) ×1 IMPLANT
SPONGE LAP 18X18 RF (DISPOSABLE) ×2 IMPLANT
SPONGE LAP 4X18 RFD (DISPOSABLE) ×2 IMPLANT
SUT ETHILON 3-0 FS-10 30 BLK (SUTURE) ×2
SUT MNCRL AB 4-0 PS2 18 (SUTURE) ×2 IMPLANT
SUT VICRYL 0 AB UR-6 (SUTURE) ×4 IMPLANT
SUTURE EHLN 3-0 FS-10 30 BLK (SUTURE) IMPLANT
TROCAR 130MM GELPORT  DAV (MISCELLANEOUS) ×1 IMPLANT
TUBING EVAC SMOKE HEATED PNEUM (TUBING) ×2 IMPLANT

## 2019-07-21 NOTE — Anesthesia Post-op Follow-up Note (Signed)
Anesthesia QCDR form completed.        

## 2019-07-21 NOTE — Progress Notes (Signed)
[place] Hospital Day(s): 1.   Post op day(s): Day of Surgery  Interval History:  Patient seen and examined no acute events or new complaints overnight.  Patient reports she is feeling better than presentation Abdominal pain improved with medications Nausea but responsive to antiemetics, no emesis No new complaints; plan for laparoscopic cholecystectomy this afternoon  Vital signs in last 24 hours: [min-max] current  Temp:  [98.1 F (36.7 C)-98.5 F (36.9 C)] 98.1 F (36.7 C) (12/22 0608) Pulse Rate:  [105-119] 110 (12/22 0608) Resp:  [16-18] 18 (12/22 0608) BP: (102-131)/(70-94) 102/70 (12/22 0608) SpO2:  [98 %-100 %] 98 % (12/22 0608)     Height: 5\' 2"  (157.5 cm) Weight: 77.1 kg BMI (Calculated): 31.09   Intake/Output last 2 shifts:  12/21 0701 - 12/22 0700 In: 2290 [P.O.:120; I.V.:2070; IV Piggyback:100] Out: -    Physical Exam:  Constitutional: alert, cooperative and no distress  Respiratory: breathing non-labored at rest  Cardiovascular: regular rate and sinus rhythm  Gastrointestinal: soft, non-tender, and non-distended, no rebound/guarding Integumentary: warm, dry, no juandice  Labs:  CBC Latest Ref Rng & Units 07/20/2019 07/19/2019 02/26/2019  WBC 4.0 - 10.5 K/uL 14.3(H) 13.1(H) 9.6  Hemoglobin 12.0 - 15.0 g/dL 11.7(L) 12.7 12.3  Hematocrit 36.0 - 46.0 % 33.4(L) 36.9 37.4  Platelets 150 - 400 K/uL 354 368 358   CMP Latest Ref Rng & Units 07/21/2019 07/20/2019 07/19/2019  Glucose 70 - 99 mg/dL 107(H) 133(H) 285(H)  BUN 6 - 20 mg/dL 7 7 9   Creatinine 0.44 - 1.00 mg/dL 0.56 0.55 0.71  Sodium 135 - 145 mmol/L 139 138 133(L)  Potassium 3.5 - 5.1 mmol/L 3.6 3.8 3.8  Chloride 98 - 111 mmol/L 106 104 103  CO2 22 - 32 mmol/L 23 24 18(L)  Calcium 8.9 - 10.3 mg/dL 9.0 9.1 9.0  Total Protein 6.5 - 8.1 g/dL 7.3 - 8.8(H)  Total Bilirubin 0.3 - 1.2 mg/dL 0.4 - 0.5  Alkaline Phos 38 - 126 U/L 135(H) - 172(H)  AST 15 - 41 U/L  17 - 25  ALT 0 - 44 U/L 27 - 42    Imaging studies: No new pertinent imaging studies   Assessment/Plan: 28 y.o. female with improved abdominal pain most likely attributed to biliary colic vs early cholecystitis.    - NPO   - IV Abx (Ceftriaxone)  - pain control prn   - Monitor abdominal examination  - Plan for robotic assisted laparoscopic cholecystectomy with Dr Dahlia Byes later this afternoon pending OR/Anesthesia availability   - All risks, benefits, and alternatives to above procedure(s) were discussed with the patient, all of his questions were answered to his expressed satisfaction, patient expresses she wishes to proceed, and informed consent was obtained.  - further management per primary team   All of the above findings and recommendations were discussed with the patient, and the medical team, and all of patient's questions were answered to her expressed satisfaction.  -- Edison Simon, PA-C [facility] 07/21/2019, 8:02 AM [phone] M-F: 7am - 4pm

## 2019-07-21 NOTE — Anesthesia Procedure Notes (Signed)
Procedure Name: Intubation Date/Time: 07/21/2019 5:08 PM Performed by: Aline Brochure, CRNA Pre-anesthesia Checklist: Patient identified, Emergency Drugs available, Suction available and Patient being monitored Patient Re-evaluated:Patient Re-evaluated prior to induction Oxygen Delivery Method: Circle system utilized Preoxygenation: Pre-oxygenation with 100% oxygen Induction Type: IV induction and Cricoid Pressure applied Ventilation: Mask ventilation without difficulty Laryngoscope Size: McGraph and 3 Grade View: Grade I Tube type: Oral Tube size: 7.0 mm Number of attempts: 1 Airway Equipment and Method: Stylet and Video-laryngoscopy Placement Confirmation: ETT inserted through vocal cords under direct vision,  positive ETCO2 and breath sounds checked- equal and bilateral Secured at: 21 cm Tube secured with: Tape Dental Injury: Teeth and Oropharynx as per pre-operative assessment

## 2019-07-21 NOTE — Op Note (Signed)
Robotic assisted laparoscopic Cholecystectomy  Pre-operative Diagnosis: Acute cholecystis  Post-operative Diagnosis: same  Procedure:  Robotic assisted laparoscopic Cholecystectomy  Surgeon: Caroleen Hamman, MD FACS  Anesthesia: Gen. with endotracheal tube  Findings: Acute  Cholecystitis w Hydrops  Estimated Blood Loss: 10cc cc       Specimens: Gallbladder           Complications: none   Procedure Details  The patient was seen again in the Holding Room. The benefits, complications, treatment options, and expected outcomes were discussed with the patient. The risks of bleeding, infection, recurrence of symptoms, failure to resolve symptoms, bile duct damage, bile duct leak, retained common bile duct stone, bowel injury, any of which could require further surgery and/or ERCP, stent, or papillotomy were reviewed with the patient. The likelihood of improving the patient's symptoms with return to their baseline status is good.  The patient and/or family concurred with the proposed plan, giving informed consent.  The patient was taken to Operating Room, identified  and the procedure verified as Laparoscopic Cholecystectomy.  A Time Out was held and the above information confirmed.  Prior to the induction of general anesthesia, antibiotic prophylaxis was administered. VTE prophylaxis was in place. General endotracheal anesthesia was then administered and tolerated well. After the induction, the abdomen was prepped with Chloraprep and draped in the sterile fashion. The patient was positioned in the supine position.  Cut down technique was used to enter the abdominal cavity and a Hasson trochar was placed after two vicryl stitches were anchored to the fascia. Pneumoperitoneum was then created with CO2 and tolerated well without any adverse changes in the patient's vital signs.  Three 8-mm ports were placed under direct vision. All skin incisions  were infiltrated with a local anesthetic agent before  making the incision and placing the trocars.   The patient was positioned  in reverse Trendelenburg, robot was brought to the surgical field and docked in the standard fashion.  We made sure all the instrumentation was kept indirect view at all times and that there were no collision between the arms. I scrubbed out and went to the console.  The gallbladder was identified, the fundus grasped and retracted cephalad. Adhesions were lysed bluntly. The infundibulum was grasped and retracted laterally, exposing the peritoneum overlying the triangle of Calot. This was then divided and exposed in a blunt fashion. An extended critical view of the cystic duct and cystic artery was obtained.  The cystic duct was clearly identified and bluntly dissected.   Artery and duct were double clipped and divided. Using ICG cholangiography we visualize the cystic duct and so no a Baron biliary ductal anatomy or evidence of bile injuries. The gallbladder was taken from the gallbladder fossa in a retrograde fashion with the electrocautery.  Hemostasis was achieved with the electrocautery. nspection of the right upper quadrant was performed. No bleeding, bile duct injury or leak, or bowel injury was noted. Robotic instruments and robotic arms were undocked in the standard fashion.  I scrubbed back in.  The gallbladder was removed and placed in an Endocatch bag.  Given degree of inflammation I placed 19 Fr blake RUQ.  Pneumoperitoneum was released.  The periumbilical port site was closed with interrumpted 0 Vicryl sutures. 4-0 subcuticular Monocryl was used to close the skin. Dermabond was  applied.  The patient was then extubated and brought to the recovery room in stable condition. Sponge, lap, and needle counts were correct at closure and at the conclusion of the case.  Caroleen Hamman, MD, FACS

## 2019-07-21 NOTE — Transfer of Care (Signed)
Immediate Anesthesia Transfer of Care Note  Patient: Dawn Ortiz  Procedure(s) Performed: XI ROBOTIC ASSISTED LAPAROSCOPIC CHOLECYSTECTOMY (N/A ) INDOCYANINE GREEN FLUORESCENCE IMAGING (ICG) (N/A )  Patient Location: PACU  Anesthesia Type:General  Level of Consciousness: sedated  Airway & Oxygen Therapy: Patient connected to face mask oxygen  Post-op Assessment: Post -op Vital signs reviewed and stable  Post vital signs: stable  Last Vitals:  Vitals Value Taken Time  BP 120/66 07/21/19 1838  Temp 36.5 C 07/21/19 1838  Pulse 117 07/21/19 1838  Resp 16 07/21/19 1838  SpO2 97 % 07/21/19 1838  Vitals shown include unvalidated device data.  Last Pain:  Vitals:   07/21/19 1838  TempSrc: Temporal  PainSc:       Patients Stated Pain Goal: 0 (91/50/56 9794)  Complications: No apparent anesthesia complications

## 2019-07-21 NOTE — Anesthesia Postprocedure Evaluation (Signed)
Anesthesia Post Note  Patient: Dawn Ortiz  Procedure(s) Performed: XI ROBOTIC ASSISTED LAPAROSCOPIC CHOLECYSTECTOMY (N/A ) INDOCYANINE GREEN FLUORESCENCE IMAGING (ICG) (N/A )  Patient location during evaluation: PACU Anesthesia Type: General Level of consciousness: awake and alert and oriented Pain management: pain level controlled Vital Signs Assessment: post-procedure vital signs reviewed and stable Respiratory status: spontaneous breathing Cardiovascular status: blood pressure returned to baseline Anesthetic complications: no     Last Vitals:  Vitals:   07/21/19 2001 07/21/19 2036  BP: 118/72 123/82  Pulse: (!) 109 (!) 110  Resp:  18  Temp: 37 C (!) 36.4 C  SpO2: 95% 97%    Last Pain:  Vitals:   07/21/19 2036  TempSrc: Oral  PainSc:                  Christien Berthelot

## 2019-07-21 NOTE — Anesthesia Preprocedure Evaluation (Signed)
Anesthesia Evaluation  Patient identified by MRN, date of birth, ID band Patient awake    Reviewed: Allergy & Precautions, NPO status , Patient's Chart, lab work & pertinent test results  History of Anesthesia Complications Negative for: history of anesthetic complications  Airway Mallampati: II       Dental   Pulmonary neg sleep apnea, neg COPD, Not current smoker, former smoker,           Cardiovascular (-) hypertension(-) Past MI and (-) CHF negative cardio ROS  (-) dysrhythmias (-) Valvular Problems/Murmurs     Neuro/Psych  Headaches, neg Seizures PSYCHIATRIC DISORDERS Anxiety Depression    GI/Hepatic Neg liver ROS, GERD  Medicated and Controlled,  Endo/Other  diabetes, Type 1, Insulin Dependent  Renal/GU negative Renal ROS  negative genitourinary   Musculoskeletal negative musculoskeletal ROS (+)   Abdominal   Peds negative pediatric ROS (+)  Hematology negative hematology ROS (+)   Anesthesia Other Findings Past Medical History: No date: Anxiety No date: Diabetes mellitus without complication (HCC) No date: GERD (gastroesophageal reflux disease)  Reproductive/Obstetrics                             Anesthesia Physical  Anesthesia Plan  ASA: III  Anesthesia Plan: General   Post-op Pain Management:    Induction: Intravenous  PONV Risk Score and Plan: 3 and Ondansetron, Midazolam and Treatment may vary due to age or medical condition  Airway Management Planned: LMA  Additional Equipment:   Intra-op Plan:   Post-operative Plan:   Informed Consent: I have reviewed the patients History and Physical, chart, labs and discussed the procedure including the risks, benefits and alternatives for the proposed anesthesia with the patient or authorized representative who has indicated his/her understanding and acceptance.       Plan Discussed with:   Anesthesia Plan Comments:          Anesthesia Quick Evaluation

## 2019-07-21 NOTE — Progress Notes (Signed)
[facility]  -  at [facility]   PATIENT NAME: Dawn Ortiz    MR#:  [id number]  DATE OF BIRTH:  1991/02/18  SUBJECTIVE:   Patient came in with abdominal pain in the epigastric area. No vomiting today. However does have epigastric abdominal pain and complains of constipation for one week.  No fever, awaiting surgery today REVIEW OF SYSTEMS:   Review of Systems  Constitutional: Negative for chills, fever and weight loss.  HENT: Negative for ear discharge, ear pain and nosebleeds.   Eyes: Negative for blurred vision, pain and discharge.  Respiratory: Negative for sputum production, shortness of breath, wheezing and stridor.   Cardiovascular: Negative for chest pain, palpitations, orthopnea and PND.  Gastrointestinal: Positive for abdominal pain and nausea. Negative for diarrhea and vomiting.  Genitourinary: Negative for frequency and urgency.  Musculoskeletal: Positive for back pain. Negative for joint pain.  Neurological: Negative for sensory change, speech change, focal weakness and weakness.  Psychiatric/Behavioral: Negative for depression and hallucinations. The patient is not nervous/anxious.    Tolerating Diet: NPO tolerating PT: ambulatory  DRUG ALLERGIES:   Allergies  Allergen Reactions  . Sulfa Antibiotics Hives    VITALS:  Blood pressure 115/71, pulse (!) 114, temperature 98 F (36.7 C), temperature source Temporal, resp. rate 18, height 5\' 2"  (1.575 m), weight 77.1 kg, last menstrual period 07/08/2019, SpO2 99 %.  PHYSICAL EXAMINATION:   Physical Exam  GENERAL:  28 y.o.-year-old patient lying in the bed with no acute distress.  EYES: Pupils equal, round, reactive to light and accommodation. No scleral icterus. Extraocular muscles intact.  HEENT: Head atraumatic, normocephalic. Oropharynx and nasopharynx clear.  NECK:  Supple, no jugular venous distention. No thyroid enlargement, no tenderness.  LUNGS: Normal breath sounds bilaterally, no  wheezing, rales, rhonchi. No use of accessory muscles of respiration.  CARDIOVASCULAR: S1, S2 normal. No murmurs, rubs, or gallops.  ABDOMEN: Soft, mild  Epigastric tenderness, nondistended. Bowel sounds present. No organomegaly or mass.  EXTREMITIES: No cyanosis, clubbing or edema b/l.    NEUROLOGIC: Cranial nerves II through XII are intact. No focal Motor or sensory deficits b/l.   PSYCHIATRIC:  patient is alert and oriented x 3.  SKIN: No obvious rash, lesion, or ulcer.   LABORATORY PANEL:  CBC Recent Labs  Lab 07/21/19 0539  WBC 9.9  HGB 11.8*  HCT 35.8*  PLT 329    Chemistries  Recent Labs  Lab 07/21/19 0539  NA 139  K 3.6  CL 106  CO2 23  GLUCOSE 107*  BUN 7  CREATININE 0.56  CALCIUM 9.0  AST 17  ALT 27  ALKPHOS 135*  BILITOT 0.4   Cardiac Enzymes No results for input(s): TROPONINI in the last 168 hours. RADIOLOGY:  No results found. ASSESSMENT AND PLAN:   Dawn Ortiz is a 28 y.o. female with medical history significant of hypertension, diabetes mellitus, GERD, depression with anxiety, ADHD, who presents with intractable nausea, vomiting, abdominal pain. Patient states that her symptoms started this morning, including intractable nausea, vomiting, epigastric abdominal pain.  He has been having many times nonbiliary nonbloody vomitus  #Abdominal pain and intractable nausea and vomiting: suspected biliary colic versus early acute cholecystitis with gallstones noted on ultrasound abdomen -Lipase normal. CT abdomen/pelvis is negative except constipation - Right upper quadrant ultrasound showed cholelithiasis without evidence of cholecystitis -patient was seen by Dr. 26-- plans for lap cholecystectomy today -IV fluids -prn Reglan, as needed Zofran, as needed Dilaudid  #Diabetes mellitus without complication (HCC):  -  Last A1c 10.0, poorly controled. Patient is taking Tyler Aas, Ozempic, metformin  at home (follows with Dr Ladell Pier-- endocrinology) -Lantus  50 units daily and sliding scale -sugars are stable  #HTN:  -Continue home medications: Lisinopril, spironolactone -hydralazine prn  #GERD (gastroesophageal reflux disease): -PPI  # Depression with anxiety and ADHS: no SI or HI -Continue home medications   DVT ppx: SQ Lovenox  Code Status: Full code Family Communication: mother aware    Disposition Plan: home  consults called:   surgery  Patient will be transferred to surgical service. Internal medicine will continue to follow. Dawn Ortiz aware   TOTAL TIME TAKING CARE OF THIS PATIENT: *25* minutes.  >50% time spent on counselling and coordination of care  POSSIBLE D/C IN *1--3* DAYS, DEPENDING ON CLINICAL CONDITION.  Note: This dictation was prepared with Dragon dictation along with smaller phrase technology. Any transcriptional errors that result from this process are unintentional.  Dawn Ortiz M.D on 07/21/2019 at 4:31 PM  Between 7am to 6pm - Pager - [phone]  After 6pm go to [email or web address]  [facility]   CC: Primary care physician; Dawn Ortiz, MDPatient ID: Dawn Ortiz, female   DOB: 1991/04/15, 28 y.o.   MRN: [id number]

## 2019-07-22 LAB — GLUCOSE, CAPILLARY
Glucose-Capillary: 155 mg/dL — ABNORMAL HIGH (ref 70–99)
Glucose-Capillary: 156 mg/dL — ABNORMAL HIGH (ref 70–99)

## 2019-07-22 MED ORDER — SENNOSIDES-DOCUSATE SODIUM 8.6-50 MG PO TABS: 2.0000 | ORAL_TABLET | Freq: Every evening | ORAL | Status: DC | PRN

## 2019-07-22 MED ORDER — IBUPROFEN 800 MG PO TABS: 800.0000 mg | ORAL_TABLET | Freq: Three times a day (TID) | ORAL | 0 refills | Status: DC | PRN

## 2019-07-22 MED ORDER — OXYCODONE HCL 5 MG PO TABS: 5.0000 mg | ORAL_TABLET | Freq: Four times a day (QID) | ORAL | 0 refills | Status: DC | PRN

## 2019-07-22 NOTE — Discharge Summary (Signed)
[facility] SURGICAL DISCHARGE SUMMARY  Patient ID: Dawn Ortiz MRN: [id number] DOB/AGE: 1991-01-11 28 y.o.  Admit date: 07/19/2019 Discharge date: 07/22/2019  Discharge Diagnoses Patient Active Problem List   Diagnosis Date Noted  . Biliary colic   . Abdominal pain 07/19/2019  . Intractable nausea and vomiting 07/19/2019  . Type 2 diabetes mellitus with hyperglycemia, with long-term current use of insulin (HCC) 02/06/2019    Consultants Medicine  Procedures 07/21/2019: Robotic Assisted Laparoscopic Cholecystectomy  HPI: Dawn Ortiz is a 28 y.o. female seen in consultation at the request of Dr. Allena Katz for biliary colic.  She was admitted yesterday from the emergency room with nausea and abdominal pain.  The patient reports the pain is intermittent is in the epigastric area and goes to both left and right upper quadrants and radiates to the back.  The pain is moderate intensity and is dull in nature.  She also has had intractable nausea and vomiting.  She has had at least 5 I&D by our group group in the past.  She does have a history of diabetes and anxiety.  Further work-up included an ultrasound and CT scan that I have personally reviewed showing evidence of gallstones.  No evidence of cholecystitis.  No evidence of any other intra-abdominal pathology.  She does have an increase in the white count to 14.3 she has abnormal bilirubin and had a chronically mildly elevated alkaline phosphatase. Her mother had also history of biliary colic and responded well to cholecystectomy She is able to perform more than 4 METS of activity without any shortness of breath or chest pain   Hospital Course: Informed consent was obtained and documented, and patient underwent uneventful robotic assisted laparoscopic cholecystectomy (Dr Everlene Farrier, 07/21/2019).  Post-operatively, patient's pain and symptoms improved/resolved and advancement of patient's diet and ambulation were  well-tolerated. The remainder of patient's hospital course was essentially unremarkable, and discharge planning was initiated accordingly with patient safely able to be discharged home with appropriate discharge instructions, pain control, and outpatient follow-up after all of her questions were answered to her expressed satisfaction.  Discharge Condition: Good   Physical Examination:  Constitutional: Well appearing female, NAD Pulmonary: Normal effort, no respiratory distress Gastrointestinal: Soft, incisional soreness, non-distended, no rebound/guarding. Blake drain in RUQ with serosanguinous output Skin: Laparoscopic incisions are CDI with dermabond, no erythema or drainage   Allergies as of 07/22/2019      Reactions   Sulfa Antibiotics Hives      Medication List    TAKE these medications   amphetamine-dextroamphetamine 25 MG 24 hr capsule Commonly known as: Adderall XR Take 1 capsule by mouth every morning.   cyclobenzaprine 10 MG tablet Commonly known as: FLEXERIL Take 1/2 pill to 1 pill at bedtime as needed   ibuprofen 800 MG tablet Commonly known as: ADVIL Take 1 tablet (800 mg total) by mouth every 8 (eight) hours as needed.   lisinopril 2.5 MG tablet Commonly known as: ZESTRIL Take 2.5 mg by mouth daily.   LORazepam 0.5 MG tablet Commonly known as: ATIVAN Take 1 tablet (0.5 mg total) by mouth 2 (two) times daily as needed for anxiety.   metFORMIN 500 MG 24 hr tablet Commonly known as: GLUCOPHAGE-XR Take 500 mg by mouth 2 (two) times daily.   omeprazole 40 MG capsule Commonly known as: PRILOSEC Take 1 capsule (40 mg total) by mouth daily. 30 minutes before food   ondansetron 4 MG tablet Commonly known as: ZOFRAN Take 1 tablet (4 mg total) by  mouth every 8 (eight) hours as needed for nausea or vomiting.   oxyCODONE 5 MG immediate release tablet Commonly known as: Oxy IR/ROXICODONE Take 1 tablet (5 mg total) by mouth every 6 (six) hours as needed for severe  pain or breakthrough pain.   Ozempic (0.25 or 0.5 MG/DOSE) 2 MG/1.5ML Sopn Generic drug: Semaglutide(0.25 or 0.5MG /DOS) Inject into the skin once a week.   rizatriptan 10 MG disintegrating tablet Commonly known as: MAXALT-MLT Take 1 tablet (10 mg total) by mouth as needed for migraine. May repeat in 2 hours if needed   sertraline 50 MG tablet Commonly known as: Zoloft Take 1 tablet (50 mg total) by mouth daily.   spironolactone 100 MG tablet Commonly known as: ALDACTONE Take 100 mg by mouth daily.   Tresiba 100 UNIT/ML Soln Generic drug: Insulin Degludec Inject 70 Units into the skin daily.   Xulane 150-35 MCG/24HR transdermal patch Generic drug: norelgestromin-ethinyl estradiol Place 1 patch onto the skin once a week. xulane patch        Follow-up Information    Pabon, [place] F, MD. Schedule an appointment as soon as possible for a visit in 1 week(s).   Specialty: General Surgery Why: s/p robotic cholecystectomy with Pabon, has drain -- will need to see one of dr Pabon's partners next week Contact information: [address] [place] [address] [phone]            Time spent on discharge management including discussion of hospital course, clinical condition, outpatient instructions, prescriptions, and follow up with the patient and members of the medical team: >30 minutes  -- Edison Simon , PA-C [facility]  07/22/2019, 12:00 PM [phone] M-F: 7am - 4pm

## 2019-07-22 NOTE — Progress Notes (Signed)
PROGRESS NOTE    Dawn Ortiz  OLI:[id number] DOB: 06/27/1991 DOA: 07/19/2019 PCP: McLean-Scocuzza, Pasty Spillers, MD   Brief Narrative:  28 year old with history of HTN, DM 2, GERD, depression, anxiety, ADHD presented with nausea, vomiting and abdominal pain.  CT abdomen pelvis was negative, right upper quadrant ultrasound showed gallstones without evidence of cholecystitis but due to her symptoms General surgery was consulted for laparoscopic cholecystectomy which was performed 12/22.  Also her diabetes was poorly controlled.  Postoperatively patient was transferred to surgical service is the primary team with medical consultation.   Assessment & Plan:   Principal Problem:   Abdominal pain Active Problems:   Diabetes mellitus without complication (HCC)   GERD (gastroesophageal reflux disease)   Depression with anxiety   Attention deficit hyperactivity disorder (ADHD), predominantly inattentive type   Leukocytosis   Intractable nausea and vomiting   HTN (hypertension)   Biliary colic  Intractable abdominal pain/nausea and vomiting Gallstones-concerning for acute cholecystitis/early -Laparoscopic cholecystectomy 12/22 by general surgery -Postop pain control, incentive spirometer -Diet as tolerated -Supportive care  Diabetes mellitus type 2 ; poorly controlled secondary to hyperglycemia -Last hemoglobin A1c 8.9.  Insulin sliding scale and Accu-Chek -Resume home meds, continue Accu-Cheks 4 times daily before meals and before bedtime.  Follow-up outpatient PCP in about 1-2 weeks.  Essential hypertension -Lisinopril, Aldactone  GERD -PPI  Depression/anxiety -Continue home meds  DVT prophylaxis: Lovenox Code Status: Full code Disposition Plan: Patient is stable for discharge from medical standpoint.    Subjective: Tolerated surgery well, no complaints this morning.  Tolerated her breakfast well as well this morning.  Wishes to go home  Review of Systems Otherwise  negative except as per HPI, including: General: Denies fever, chills, night sweats or unintended weight loss. Resp: Denies cough, wheezing, shortness of breath. Cardiac: Denies chest pain, palpitations, orthopnea, paroxysmal nocturnal dyspnea. GI: Denies abdominal pain, nausea, vomiting, diarrhea or constipation GU: Denies dysuria, frequency, hesitancy or incontinence MS: Denies muscle aches, joint pain or swelling Neuro: Denies headache, neurologic deficits (focal weakness, numbness, tingling), abnormal gait Psych: Denies anxiety, depression, SI/HI/AVH Skin: Denies new rashes or lesions ID: Denies sick contacts, exotic exposures, travel  Objective: Vitals:   07/21/19 2133 07/21/19 2236 07/22/19 0107 07/22/19 0437  BP: 116/73 118/64 114/77 126/85  Pulse: (!) 109 (!) 105 (!) 101 96  Resp: 20 20 20 20   Temp: 98.3 F (36.8 C) 97.6 F (36.4 C) 97.7 F (36.5 C) 97.7 F (36.5 C)  TempSrc: Oral Oral Oral Oral  SpO2: 99% 97% 97% 97%  Weight:      Height:        Intake/Output Summary (Last 24 hours) at 07/22/2019 0806 Last data filed at 07/22/2019 0500 Gross per 24 hour  Intake 1447.54 ml  Output 85 ml  Net 1362.54 ml   Filed Weights   07/19/19 0952 07/21/19 1549  Weight: 77.1 kg 77.1 kg    Examination:  General exam: Appears calm and comfortable  Respiratory system: Clear to auscultation. Respiratory effort normal. Cardiovascular system: S1 & S2 heard, RRR. No JVD, murmurs, rubs, gallops or clicks. No pedal edema. Gastrointestinal system: Abdomen is nondistended, soft and nontender. No organomegaly or masses felt. Normal bowel sounds heard. Central nervous system: Alert and oriented. No focal neurological deficits. Extremities: Symmetric 5 x 5 power. Skin: No rashes, lesions or ulcers Psychiatry: Judgement and insight appear normal. Mood & affect appropriate.     Data Reviewed:   CBC: Recent Labs  Lab 07/19/19 1022 07/20/19 0416 07/21/19  0539  WBC 13.1* 14.3* 9.9   NEUTROABS 9.2*  --   --   HGB 12.7 11.7* 11.8*  HCT 36.9 33.4* 35.8*  MCV 75.8* 75.4* 79.9*  PLT 368 354 341   Basic Metabolic Panel: Recent Labs  Lab 07/19/19 1022 07/20/19 0416 07/21/19 0539  NA 133* 138 139  K 3.8 3.8 3.6  CL 103 104 106  CO2 18* 24 23  GLUCOSE 285* 133* 107*  BUN 9 7 7   CREATININE 0.71 0.55 0.56  CALCIUM 9.0 9.1 9.0   GFR: Estimated Creatinine Clearance: 100.7 mL/min (by C-G formula based on SCr of 0.56 mg/dL). Liver Function Tests: Recent Labs  Lab 07/19/19 1022 07/21/19 0539  AST 25 17  ALT 42 27  ALKPHOS 172* 135*  BILITOT 0.5 0.4  PROT 8.8* 7.3  ALBUMIN 4.1 3.3*   Recent Labs  Lab 07/19/19 1022  LIPASE 41   No results for input(s): AMMONIA in the last 168 hours. Coagulation Profile: No results for input(s): INR, PROTIME in the last 168 hours. Cardiac Enzymes: No results for input(s): CKTOTAL, CKMB, CKMBINDEX, TROPONINI in the last 168 hours. BNP (last 3 results) No results for input(s): PROBNP in the last 8760 hours. HbA1C: Recent Labs    07/19/19 1614  HGBA1C 8.9*   CBG: Recent Labs  Lab 07/21/19 1153 07/21/19 1610 07/21/19 1847 07/21/19 2134 07/22/19 0749  GLUCAP 105* 100* 128* 187* 155*   Lipid Profile: No results for input(s): CHOL, HDL, LDLCALC, TRIG, CHOLHDL, LDLDIRECT in the last 72 hours. Thyroid Function Tests: No results for input(s): TSH, T4TOTAL, FREET4, T3FREE, THYROIDAB in the last 72 hours. Anemia Panel: No results for input(s): VITAMINB12, FOLATE, FERRITIN, TIBC, IRON, RETICCTPCT in the last 72 hours. Sepsis Labs: Recent Labs  Lab 07/19/19 1614  LATICACIDVEN 1.9    Recent Results (from the past 240 hour(s))  SARS CORONAVIRUS 2 (TAT 6-24 HRS) Nasopharyngeal Nasopharyngeal Swab     Status: None   Collection Time: 07/19/19  3:33 PM   Specimen: Nasopharyngeal Swab  Result Value Ref Range Status   SARS Coronavirus 2 NEGATIVE NEGATIVE Final    Comment: (NOTE) SARS-CoV-2 target nucleic acids are NOT  DETECTED. The SARS-CoV-2 RNA is generally detectable in upper and lower respiratory specimens during the acute phase of infection. Negative results do not preclude SARS-CoV-2 infection, do not rule out co-infections with other pathogens, and should not be used as the sole basis for treatment or other patient management decisions. Negative results must be combined with clinical observations, patient history, and epidemiological information. The expected result is Negative. Fact Sheet for Patients: SugarRoll.be Fact Sheet for Healthcare Providers: https://www.woods-mathews.com/ This test is not yet approved or cleared by the [place] FDA and  has been authorized for detection and/or diagnosis of SARS-CoV-2 by FDA under an Emergency Use Authorization (EUA). This EUA will remain  in effect (meaning this test can be used) for the duration of the COVID-19 declaration under Section 56 4(b)(1) of the Act, 21 U.S.C. section 360bbb-3(b)(1), unless the authorization is terminated or revoked sooner. Performed at [facility], [place] [address], [place], [place] [address]   CULTURE, BLOOD (ROUTINE X 2) w Reflex to ID Panel     Status: None (Preliminary result)   Collection Time: 07/19/19  4:14 PM   Specimen: BLOOD  Result Value Ref Range Status   Specimen Description BLOOD BLOOD LEFT FOREARM  Final   Special Requests   Final    BOTTLES DRAWN AEROBIC AND ANAEROBIC Blood Culture  adequate volume   Culture   Final    NO GROWTH 3 DAYS Performed at [facility], [address] [place], [place] [address]    Report Status PENDING  Incomplete  CULTURE, BLOOD (ROUTINE X 2) w Reflex to ID Panel     Status: None (Preliminary result)   Collection Time: 07/19/19  4:15 PM   Specimen: BLOOD  Result Value Ref Range Status   Specimen Description BLOOD LEFT ANTECUBITAL  Final   Special Requests   Final    BOTTLES DRAWN AEROBIC AND ANAEROBIC Blood  Culture adequate volume   Culture   Final    NO GROWTH 3 DAYS Performed at [facility], [address] [place], [place] [address]    Report Status PENDING  Incomplete         Radiology Studies: No results found.      Scheduled Meds: . acetaminophen  1,000 mg Oral Q6H  . amphetamine-dextroamphetamine  25 mg Oral BH-q7a  . bisacodyl  10 mg Rectal Daily  . enoxaparin (LOVENOX) injection  40 mg Subcutaneous Q24H  . insulin aspart  0-15 Units Subcutaneous TID AC & HS  . insulin glargine  50 Units Subcutaneous q morning - 10a  . ketorolac  30 mg Intravenous Q6H  . lisinopril  2.5 mg Oral Daily  . metoCLOPramide (REGLAN) injection  5 mg Intravenous Q8H  . pantoprazole  40 mg Oral Daily  . polyethylene glycol  17 g Oral BID  . sertraline  50 mg Oral Daily  . spironolactone  100 mg Oral Daily   Continuous Infusions: . sodium chloride 50 mL/hr at 07/22/19 0500  . cefTRIAXone (ROCEPHIN)  IV 200 mL/hr at 07/21/19 1541     LOS: 2 days   Time spent= 15 mins    Harjit Leider Joline Maxcyhirag Mivaan Corbitt, MD [facility]  If 7PM-7AM, please contact night-coverage  07/22/2019, 8:06 AM

## 2019-07-22 NOTE — Progress Notes (Signed)
Inpatient Diabetes Program Recommendations  AACE/ADA: New Consensus Statement on Inpatient Glycemic Control (2015)  Target Ranges:  Prepandial:   less than 140 mg/dL      Peak postprandial:   less than 180 mg/dL (1-2 hours)      Critically ill patients:  140 - 180 mg/dL   Lab Results  Component Value Date   GLUCAP 156 (H) 07/22/2019   HGBA1C 8.9 (H) 07/19/2019    Review of Glycemic Control Results for Dawn Ortiz, Dawn Ortiz (MRN [id number]) as of 07/22/2019 12:03  Ref. Range 07/21/2019 16:10 07/21/2019 18:47 07/21/2019 21:34 07/22/2019 07:49 07/22/2019 11:48  Glucose-Capillary Latest Ref Range: 70 - 99 mg/dL 100 (H) 128 (H) 187 (H) 155 (H) 156 (H)   Diabetes history: DM 2 Outpatient Diabetes medications:  Metformin 500 mg bid, Ozempic 0.25 mg weekly, Tresiba 70 units daily Current orders for Inpatient glycemic control:  Novolog moderate tid w/ meals Lantus 50 units daily Inpatient Diabetes Program Recommendations:    Referral received. Patient to discharge home today.  Called patient to discuss A1C results of 8.9%.  This is actually an improvement from July of 2020.  She states that she is now seeing Dr. Honor Junes for endocrinology and states that blood sugars have improved.  Encouraged her to f/u with Dr. Honor Junes and reminded her that goal A1C=7%.  We also discussed that blood sugars need to be controlled to prevent infection.  Patient verbalized understanding.  States that she does not have any needs at this time related to DM.   Thanks  Adah Perl, RN, BC-ADM Inpatient Diabetes Coordinator Pager [phone] (8a-5p)

## 2019-07-22 NOTE — Progress Notes (Signed)
Dawn Ortiz to be D/C'd home with mother per MD order.  Discussed prescriptions and follow up appointments with the patient. Prescriptions given to patient, medication list explained in detail. Pt verbalized understanding.  Allergies as of 07/22/2019       Reactions   Sulfa Antibiotics Hives        Medication List     TAKE these medications    amphetamine-dextroamphetamine 25 MG 24 hr capsule Commonly known as: Adderall XR Take 1 capsule by mouth every morning.   cyclobenzaprine 10 MG tablet Commonly known as: FLEXERIL Take 1/2 pill to 1 pill at bedtime as needed   ibuprofen 800 MG tablet Commonly known as: ADVIL Take 1 tablet (800 mg total) by mouth every 8 (eight) hours as needed.   lisinopril 2.5 MG tablet Commonly known as: ZESTRIL Take 2.5 mg by mouth daily.   LORazepam 0.5 MG tablet Commonly known as: ATIVAN Take 1 tablet (0.5 mg total) by mouth 2 (two) times daily as needed for anxiety.   metFORMIN 500 MG 24 hr tablet Commonly known as: GLUCOPHAGE-XR Take 500 mg by mouth 2 (two) times daily.   omeprazole 40 MG capsule Commonly known as: PRILOSEC Take 1 capsule (40 mg total) by mouth daily. 30 minutes before food   ondansetron 4 MG tablet Commonly known as: ZOFRAN Take 1 tablet (4 mg total) by mouth every 8 (eight) hours as needed for nausea or vomiting.   oxyCODONE 5 MG immediate release tablet Commonly known as: Oxy IR/ROXICODONE Take 1 tablet (5 mg total) by mouth every 6 (six) hours as needed for severe pain or breakthrough pain.   Ozempic (0.25 or 0.5 MG/DOSE) 2 MG/1.5ML Sopn Generic drug: Semaglutide(0.25 or 0.5MG /DOS) Inject into the skin once a week.   rizatriptan 10 MG disintegrating tablet Commonly known as: MAXALT-MLT Take 1 tablet (10 mg total) by mouth as needed for migraine. May repeat in 2 hours if needed   sertraline 50 MG tablet Commonly known as: Zoloft Take 1 tablet (50 mg total) by mouth daily.   spironolactone 100 MG  tablet Commonly known as: ALDACTONE Take 100 mg by mouth daily.   Tresiba 100 UNIT/ML Soln Generic drug: Insulin Degludec Inject 70 Units into the skin daily.   Xulane 150-35 MCG/24HR transdermal patch Generic drug: norelgestromin-ethinyl estradiol Place 1 patch onto the skin once a week. xulane patch        Vitals:   07/22/19 1150 07/22/19 1200  BP: 119/80   Pulse: (!) 117 (!) 104  Resp: 18   Temp: 98.2 F (36.8 C)   SpO2: 98%     Skin clean, dry and intact without evidence of skin break down, no evidence of skin tears noted. IV catheter discontinued intact. Site without signs and symptoms of complications. Dressing and pressure applied. Pt denies pain at this time. No complaints noted.  An After Visit Summary was printed and given to the patient. Patient escorted via [place], and D/C home via private auto.  [place] Dawn Ortiz

## 2019-07-23 LAB — SURGICAL PATHOLOGY

## 2019-07-24 LAB — CULTURE, BLOOD (ROUTINE X 2)
Culture: NO GROWTH
Culture: NO GROWTH
Special Requests: ADEQUATE
Special Requests: ADEQUATE

## 2019-07-28 ENCOUNTER — Ambulatory Visit (INDEPENDENT_AMBULATORY_CARE_PROVIDER_SITE_OTHER): Payer: Self-pay | Admitting: Surgery

## 2019-07-28 ENCOUNTER — Encounter: Payer: Self-pay | Admitting: Surgery

## 2019-07-28 ENCOUNTER — Other Ambulatory Visit: Payer: Self-pay

## 2019-07-28 VITALS — BP 140/88 | HR 121 | Temp 98.4°F | Ht 62.0 in | Wt 173.4 lb

## 2019-07-28 DIAGNOSIS — K8000 Calculus of gallbladder with acute cholecystitis without obstruction: Secondary | ICD-10-CM

## 2019-07-28 NOTE — Progress Notes (Signed)
07/28/2019  HPI: Dawn Ortiz is a 28 y.o. female s/p robotic assisted laparoscopic cholecystectomy on 12/22 by Dr. Dahlia Byes.  Blake drain was left in the right upper quadrant due to significant inflammation.  Patient was found to have acute cholecystitis with hydrops.  Patient presents today for follow-up.  She reports that she has been doing well with pain well controlled and appropriate for after surgery.  She reports that the drain has been working well and has had minimal output over the last 2 days.  She has been emptying it as instructed.  Vital signs: BP 140/88   Pulse (!) 121   Temp 98.4 F (36.9 C) (Temporal)   Ht 5\' 2"  (1.575 m)   Wt 78.7 kg   LMP 07/08/2019 (Exact Date) Comment: neg preg test  SpO2 98%   BMI 31.72 kg/m    Physical Exam: Constitutional: No acute distress Abdomen: Soft, nondistended, appropriately tender to palpation.  Incisions are clean dry and intact with no evidence of infection.  Assessment/Plan: This is a 28 y.o. female s/p robotic assisted laparoscopic cholecystectomy.  -Drain removed with no complications.  Dry gauze dressing applied. -Patient is healing well overall. -Patient will follow-up in 2 weeks with Dr. Dahlia Byes.   Melvyn Neth, [facility]

## 2019-07-28 NOTE — Patient Instructions (Signed)
Dr.Piscoya removed patient's Dawn Ortiz drain at today's visit.   Patient may removed dressing to shower and then apply another gauze to the wound after the shower.

## 2019-08-06 ENCOUNTER — Telehealth: Payer: Self-pay

## 2019-08-06 DIAGNOSIS — F900 Attention-deficit hyperactivity disorder, predominantly inattentive type: Secondary | ICD-10-CM

## 2019-08-06 DIAGNOSIS — F418 Other specified anxiety disorders: Secondary | ICD-10-CM

## 2019-08-06 MED ORDER — LORAZEPAM 0.5 MG PO TABS: 0.5000 mg | ORAL_TABLET | Freq: Two times a day (BID) | ORAL | 1 refills | Status: DC | PRN

## 2019-08-06 MED ORDER — SERTRALINE HCL 50 MG PO TABS: 50.0000 mg | ORAL_TABLET | Freq: Every day | ORAL | 1 refills | Status: DC

## 2019-08-06 MED ORDER — AMPHETAMINE-DEXTROAMPHET ER 25 MG PO CP24: 25.0000 mg | ORAL_CAPSULE | ORAL | 0 refills | Status: DC

## 2019-08-06 NOTE — Telephone Encounter (Signed)
Prescriptions sent

## 2019-08-06 NOTE — Telephone Encounter (Signed)
Pt called left message that she needs refills on her medicaitons.

## 2019-08-12 ENCOUNTER — Encounter: Payer: Managed Care, Other (non HMO) | Admitting: Surgery

## 2019-08-17 ENCOUNTER — Ambulatory Visit: Payer: Self-pay | Admitting: Gastroenterology

## 2019-08-27 ENCOUNTER — Ambulatory Visit (INDEPENDENT_AMBULATORY_CARE_PROVIDER_SITE_OTHER): Payer: 59 | Admitting: Psychiatry

## 2019-08-27 ENCOUNTER — Other Ambulatory Visit: Payer: Self-pay

## 2019-08-27 ENCOUNTER — Encounter: Payer: Self-pay | Admitting: Psychiatry

## 2019-08-27 DIAGNOSIS — F900 Attention-deficit hyperactivity disorder, predominantly inattentive type: Secondary | ICD-10-CM

## 2019-08-27 DIAGNOSIS — F418 Other specified anxiety disorders: Secondary | ICD-10-CM | POA: Diagnosis not present

## 2019-08-27 MED ORDER — AMPHETAMINE-DEXTROAMPHET ER 30 MG PO CP24: 30.0000 mg | ORAL_CAPSULE | ORAL | 0 refills | Status: DC

## 2019-08-27 MED ORDER — SERTRALINE HCL 50 MG PO TABS: ORAL_TABLET | ORAL | 1 refills | Status: DC

## 2019-08-27 NOTE — Progress Notes (Signed)
[place] MD OP Progress Note  I connected with  Dawn Ortiz on 08/27/19 by a video enabled telemedicine application and verified that I am speaking with the correct person using two identifiers.   I discussed the limitations of evaluation and management by telemedicine. The patient expressed understanding and agreed to proceed.   08/27/2019 8:42 AM Dawn Ortiz  MRN:  [id number]  Chief Complaint:  " I have had to use Lorazepam more frequently compared to the past."  HPI: Pt reported that though her medication Adderall XR is helping she still does not think it last long enough like she would want it to. She also reported that she has had to take 2 tablets of Lorazepam on 1-2 occassions per week and even after taking 2 tablets she could not tell if that helped much. She reported stress at work as well as at home. She did not elaborate further and stated that she thinks her anxiety is still ongoing. She was recommended increase in the dose of Sertraline to help with her anxiety. She was agreeable to increasing the dose of Adderall XR to 30 mg for optimal effect. She stated that she is more productive at work compared to the past but feels she can do better.  Visit Diagnosis:    ICD-10-CM   1. Depression with anxiety  F41.8 sertraline (ZOLOFT) 50 MG tablet  2. Attention deficit hyperactivity disorder (ADHD), predominantly inattentive type  F90.0 amphetamine-dextroamphetamine (ADDERALL XR) 30 MG 24 hr capsule    amphetamine-dextroamphetamine (ADDERALL XR) 30 MG 24 hr capsule    Past Psychiatric History: Depression, anxiety  Past Medical History:  Past Medical History:  Diagnosis Date  . Anxiety   . Diabetes mellitus without complication ([place])   . GERD (gastroesophageal reflux disease)     Past Surgical History:  Procedure Laterality Date  . I & D EXTREMITY Left 03/05/2019   Procedure: Irrigation and debridement;  Surgeon: Olean Ree, MD;  Location: [facility];  Service: General;   Laterality: Left;  . INCISION AND DRAINAGE     right breast x1 and left groin x1 and left axilla  . INCISION AND DRAINAGE ABSCESS Left 07/02/2016   Procedure: INCISION AND DRAINAGE ABSCESS;  Surgeon: Florene Glen, MD;  Location: [facility];  Service: General;  Laterality: Left;  . INCISION AND DRAINAGE ABSCESS Left 04/18/2017   Procedure: INCISION AND DRAINAGE ABSCESS-LEFT GROIN;  Surgeon: Vickie Epley, MD;  Location: [facility];  Service: General;  Laterality: Left;  . INCISION AND DRAINAGE ABSCESS Right 05/21/2018   Procedure: INCISION AND DRAINAGE ABSCESS- RIGHT THIGH;  Surgeon: Olean Ree, MD;  Location: [facility];  Service: General;  Laterality: Right;  . IRRIGATION AND DEBRIDEMENT ABSCESS Right 04/09/2018   Procedure: IRRIGATION AND DEBRIDEMENT ABSCESS;  Surgeon: Olean Ree, MD;  Location: [facility];  Service: General;  Laterality: Right;  . IRRIGATION AND DEBRIDEMENT ABSCESS Left 05/21/2018   Procedure: IRRIGATION AND DEBRIDEMENT BREAST ABSCESS;  Surgeon: Olean Ree, MD;  Location: [facility];  Service: General;  Laterality: Left;  . PILONIDAL CYST EXCISION  12/03/2014   Procedure: CYST EXCISION PILONIDAL EXTENSIVE;  Surgeon: Molly Maduro, MD;  Location: [facility];  Service: General;;     Family History:  Family History  Problem Relation Age of Onset  . Healthy Mother   . Hypertension Father   . Diabetes Father   . Heart disease Father   . Sarcoidosis Father   . Healthy Brother   . Obesity Brother   . Cancer  Paternal Grandfather        ?prostate   . Hypertension Other     Social History:  Social History   Socioeconomic History  . Marital status: Single    Spouse name: Not on file  . Number of children: Not on file  . Years of education: Not on file  . Highest education level: Not on file  Occupational History  . Not on file  Tobacco Use  . Smoking status: Former Smoker    Types: Cigarettes  . Smokeless tobacco: Never Used  Substance and Sexual Activity   . Alcohol use: Yes    Alcohol/week: 1.0 - 2.0 standard drinks    Types: 1 - 2 Glasses of wine per week    Comment: ocassional  . Drug use: No  . Sexual activity: Yes    Birth control/protection: Implant  Other Topics Concern  . Not on file  Social History Narrative   DPR mom Lizzet Hendley [phone]    Dad Lura Falor [phone]    No kids    Works for [employer]   Social Determinants of Health   Financial Resource Strain:   . Difficulty of Paying Living Expenses: Not on file  Food Insecurity:   . Worried About Programme researcher, broadcasting/film/video in the Last Year: Not on file  . Ran Out of Food in the Last Year: Not on file  Transportation Needs:   . Lack of Transportation (Medical): Not on file  . Lack of Transportation (Non-Medical): Not on file  Physical Activity:   . Days of Exercise per Week: Not on file  . Minutes of Exercise per Session: Not on file  Stress:   . Feeling of Stress : Not on file  Social Connections:   . Frequency of Communication with Friends and Family: Not on file  . Frequency of Social Gatherings with Friends and Family: Not on file  . Attends Religious Services: Not on file  . Active Member of Clubs or Organizations: Not on file  . Attends Banker Meetings: Not on file  . Marital Status: Not on file    Allergies:  Allergies  Allergen Reactions  . Sulfa Antibiotics Hives    Metabolic Disorder Labs: Lab Results  Component Value Date   HGBA1C 8.9 (H) 07/19/2019   MPG 208.73 07/19/2019   No results found for: PROLACTIN Lab Results  Component Value Date   CHOL 127 02/26/2019   TRIG 96 02/26/2019   HDL 37 (L) 02/26/2019   CHOLHDL 3.4 02/26/2019   LDLCALC 71 02/26/2019   LDLCALC 94 10/21/2017   Lab Results  Component Value Date   TSH 0.773 02/26/2019   TSH 0.425 (L) 10/12/2016    Therapeutic Level Labs: No results found for: LITHIUM No results found for: VALPROATE No components found for:  CBMZ  Current  Medications: Current Outpatient Medications  Medication Sig Dispense Refill  . amphetamine-dextroamphetamine (ADDERALL XR) 30 MG 24 hr capsule Take 1 capsule (30 mg total) by mouth every morning. 30 capsule 0  . [START ON 09/26/2019] amphetamine-dextroamphetamine (ADDERALL XR) 30 MG 24 hr capsule Take 1 capsule (30 mg total) by mouth every morning. 30 capsule 0  . B-D ULTRAFINE III SHORT PEN 31G X 8 MM MISC     . Clindamycin Phosphate foam     . cyclobenzaprine (FLEXERIL) 10 MG tablet Take 1/2 pill to 1 pill at bedtime as needed 30 tablet 3  . ibuprofen (ADVIL) 800 MG tablet Take 1 tablet (  800 mg total) by mouth every 8 (eight) hours as needed. 30 tablet 0  . Insulin Degludec (TRESIBA) 100 UNIT/ML SOLN Inject 70 Units into the skin daily.     . Lancets (ONETOUCH DELICA PLUS LANCET33G) MISC     . lisinopril (PRINIVIL,ZESTRIL) 2.5 MG tablet Take 2.5 mg by mouth daily.    Marland Kitchen LORazepam (ATIVAN) 0.5 MG tablet Take 1 tablet (0.5 mg total) by mouth 2 (two) times daily as needed for anxiety. 45 tablet 1  . metFORMIN (GLUCOPHAGE-XR) 500 MG 24 hr tablet Take 500 mg by mouth 2 (two) times daily.    . norelgestromin-ethinyl estradiol [facility]) 150-35 MCG/24HR transdermal patch Place 1 patch onto the skin once a week. xulane patch    . omeprazole (PRILOSEC) 40 MG capsule Take 1 capsule (40 mg total) by mouth daily. 30 minutes before food 60 capsule 2  . ondansetron (ZOFRAN) 4 MG tablet Take 1 tablet (4 mg total) by mouth every 8 (eight) hours as needed for nausea or vomiting. 20 tablet 0  . ONETOUCH ULTRA test strip     . rizatriptan (MAXALT-MLT) 10 MG disintegrating tablet Take 1 tablet (10 mg total) by mouth as needed for migraine. May repeat in 2 hours if needed 9 tablet 11  . Semaglutide,0.25 or 0.5MG /DOS, (OZEMPIC, 0.25 OR 0.5 MG/DOSE,) 2 MG/1.5ML SOPN Inject into the skin once a week.    . sertraline (ZOLOFT) 50 MG tablet Take one and a half tablets daily 45 tablet 1  . spironolactone (ALDACTONE) 100 MG  tablet Take 100 mg by mouth daily.     No current facility-administered medications for this visit.     Musculoskeletal: Strength & Muscle Tone: unable to assess due to telemed visit Gait & Station: unable to assess due to telemed visit Patient leans: unable to assess due to telemed visit   Psychiatric Specialty Exam: ROS  There were no vitals taken for this visit.There is no height or weight on file to calculate BMI.  General Appearance: Well Groomed  Eye Contact:  Good  Speech:  Clear and Coherent and Normal Rate  Volume:  Normal  Mood:  Euthymic  Affect:  Congruent  Thought Process:  Goal Directed, Linear and Descriptions of Associations: Intact  Orientation:  Full (Time, Place, and Person)  Thought Content: Logical   Suicidal Thoughts:  No  Homicidal Thoughts:  No  Memory:  Recent;   Good Remote;   Good  Judgement:  Good  Insight:  Good  Psychomotor Activity:  Normal  Concentration:  Concentration: Good and Attention Span: Good  Recall:  Good  Fund of Knowledge: Good  Language: Good  Akathisia:  No  Handed:  Right  AIMS (if indicated): not done  Assets:  Communication Skills Desire for Improvement Financial Resources/Insurance Housing Social Support Talents/Skills Transportation  ADL's:  Intact  Cognition: WNL  Sleep:  Good    Assessment and Plan: Pt feels she has been more anxious lately and also feels her ADHD symptoms can be controlled better in the afternoon. She was agreeable to increases in the doses of Sertraline and Adderall XR for optimal effects.  1. Depression with anxiety  - Increase sertraline (ZOLOFT) 75 MG tablet; Take one and a half tablets daily  Dispense: 45 tablet; Refill: 1 - Continue Lorazepam 0.5 mg PRN, refills sent on January 7.  2. Attention deficit hyperactivity disorder (ADHD), predominantly inattentive type  - Increase amphetamine-dextroamphetamine (ADDERALL XR) 30 MG 24 hr capsule; Take 1 capsule (30 mg total) by mouth  every  morning.  Dispense: 30 capsule; Refill: 0 - amphetamine-dextroamphetamine (ADDERALL XR) 30 MG 24 hr capsule; Take 1 capsule (30 mg total) by mouth every morning.  Dispense: 30 capsule; Refill: 0   F/up in 6 weeks.  Zena Amos, MD 08/27/2019, 8:42 AM

## 2019-09-08 ENCOUNTER — Telehealth: Payer: Self-pay

## 2019-09-08 NOTE — Telephone Encounter (Signed)
LVM.  Contacted patient in regards to reviewing her medications prior to her tomorrows virtual visit with Dr. Allegra Lai.  Thanks,  [place], [place]

## 2019-09-09 ENCOUNTER — Other Ambulatory Visit: Payer: Self-pay

## 2019-09-09 ENCOUNTER — Encounter: Payer: Self-pay | Admitting: Gastroenterology

## 2019-09-09 ENCOUNTER — Ambulatory Visit (INDEPENDENT_AMBULATORY_CARE_PROVIDER_SITE_OTHER): Payer: Managed Care, Other (non HMO) | Admitting: Gastroenterology

## 2019-09-09 DIAGNOSIS — R1013 Epigastric pain: Secondary | ICD-10-CM | POA: Diagnosis not present

## 2019-09-09 DIAGNOSIS — K5900 Constipation, unspecified: Secondary | ICD-10-CM

## 2019-09-09 MED ORDER — METOCLOPRAMIDE HCL 5 MG PO TABS: 5.0000 mg | ORAL_TABLET | Freq: Three times a day (TID) | ORAL | 0 refills | Status: DC

## 2019-09-09 NOTE — Progress Notes (Signed)
Sherri Sear, MD [address]  [place]  [place], [place] [address]  Main: [phone]  Fax: [phone]    Gastroenterology Consultation Video Visit  Referring Provider:     McLean-Scocuzza, Olivia Mackie * Primary Care Physician:  McLean-Scocuzza, Nino Glow, MD Primary Gastroenterologist:  Dr. Cephas Darby Reason for Consultation: Dyspepsia        HPI:   Dawn Ortiz is a 29 y.o. female referred by Dr. Terese Door, Nino Glow, MD  for consultation & management of dyspepsia  Virtual Visit Video Note  I connected with Dawn Ortiz on 09/09/19 at 10:15 AM EST by video and verified that I am speaking with the correct person using two identifiers.   I discussed the limitations, risks, security and privacy concerns of performing an evaluation and management service by video and the availability of in person appointments. I also discussed with the patient that there may be a patient responsible charge related to this service. The patient expressed understanding and agreed to proceed.  Location of the Patient: Office  Location of the provider: Office  Persons participating in the visit: Patient and provider only   History of Present Illness: Dawn Ortiz is a 29 year old female with metabolic syndrome, poorly controlled diabetes, who recently underwent laparoscopic cholecystectomy in 06/2019 secondary to acute on chronic calculus cholecystitis with a drain placement for 1 week.  Patient reports that she recovered from the surgery well.  However, she continues to have episodes of upper abdominal pain, particularly in the epigastric region associated with early satiety, feeling full, sulfur burps, bloating as well as constipation.  She is currently taking omeprazole 40 mg once a day.  She is on insulin for diabetes, last hemoglobin A1c was 8.9. She denies drinking carbonated beverages, does not smoke or drink alcohol regularly She denies rectal bleeding   NSAIDs: None   Antiplts/Anticoagulants/Anti thrombotics: None  GI Procedures: None She denies family history of GI malignancy  Past Medical History:  Diagnosis Date  . Anxiety   . Diabetes mellitus without complication ([place])   . GERD (gastroesophageal reflux disease)     Past Surgical History:  Procedure Laterality Date  . I & D EXTREMITY Left 03/05/2019   Procedure: Irrigation and debridement;  Surgeon: Olean Ree, MD;  Location: [facility];  Service: General;  Laterality: Left;  . INCISION AND DRAINAGE     right breast x1 and left groin x1 and left axilla  . INCISION AND DRAINAGE ABSCESS Left 07/02/2016   Procedure: INCISION AND DRAINAGE ABSCESS;  Surgeon: Florene Glen, MD;  Location: [facility];  Service: General;  Laterality: Left;  . INCISION AND DRAINAGE ABSCESS Left 04/18/2017   Procedure: INCISION AND DRAINAGE ABSCESS-LEFT GROIN;  Surgeon: Vickie Epley, MD;  Location: [facility];  Service: General;  Laterality: Left;  . INCISION AND DRAINAGE ABSCESS Right 05/21/2018   Procedure: INCISION AND DRAINAGE ABSCESS- RIGHT THIGH;  Surgeon: Olean Ree, MD;  Location: [facility];  Service: General;  Laterality: Right;  . IRRIGATION AND DEBRIDEMENT ABSCESS Right 04/09/2018   Procedure: IRRIGATION AND DEBRIDEMENT ABSCESS;  Surgeon: Olean Ree, MD;  Location: [facility];  Service: General;  Laterality: Right;  . IRRIGATION AND DEBRIDEMENT ABSCESS Left 05/21/2018   Procedure: IRRIGATION AND DEBRIDEMENT BREAST ABSCESS;  Surgeon: Olean Ree, MD;  Location: [facility];  Service: General;  Laterality: Left;  . PILONIDAL CYST EXCISION  12/03/2014   Procedure: CYST EXCISION PILONIDAL EXTENSIVE;  Surgeon: Molly Maduro, MD;  Location: [facility];  Service:  General;;    Current Outpatient Medications:  .  amphetamine-dextroamphetamine (ADDERALL XR) 30 MG 24 hr capsule, Take 1 capsule (30 mg total) by mouth every morning., Disp: 30 capsule, Rfl: 0 .  [START ON 09/26/2019] amphetamine-dextroamphetamine  (ADDERALL XR) 30 MG 24 hr capsule, Take 1 capsule (30 mg total) by mouth every morning., Disp: 30 capsule, Rfl: 0 .  B-D ULTRAFINE III SHORT PEN 31G X 8 MM MISC, , Disp: , Rfl:  .  Clindamycin Phosphate foam, , Disp: , Rfl:  .  cyclobenzaprine (FLEXERIL) 10 MG tablet, Take 1/2 pill to 1 pill at bedtime as needed, Disp: 30 tablet, Rfl: 3 .  ibuprofen (ADVIL) 800 MG tablet, Take 1 tablet (800 mg total) by mouth every 8 (eight) hours as needed., Disp: 30 tablet, Rfl: 0 .  Insulin Degludec (TRESIBA) 100 UNIT/ML SOLN, Inject 70 Units into the skin daily. , Disp: , Rfl:  .  Lancets (ONETOUCH DELICA PLUS LANCET33G) MISC, , Disp: , Rfl:  .  lisinopril (PRINIVIL,ZESTRIL) 2.5 MG tablet, Take 2.5 mg by mouth daily., Disp: , Rfl:  .  LORazepam (ATIVAN) 0.5 MG tablet, Take 1 tablet (0.5 mg total) by mouth 2 (two) times daily as needed for anxiety., Disp: 45 tablet, Rfl: 1 .  metFORMIN (GLUCOPHAGE-XR) 500 MG 24 hr tablet, Take 500 mg by mouth 2 (two) times daily., Disp: , Rfl:  .  norelgestromin-ethinyl estradiol [employer]ico) 150-35 MCG/24HR transdermal patch, Place 1 patch onto the skin once a week. xulane patch, Disp: , Rfl:  .  omeprazole (PRILOSEC) 40 MG capsule, Take 1 capsule (40 mg total) by mouth daily. 30 minutes before food, Disp: 60 capsule, Rfl: 2 .  ondansetron (ZOFRAN) 4 MG tablet, Take 1 tablet (4 mg total) by mouth every 8 (eight) hours as needed for nausea or vomiting., Disp: 20 tablet, Rfl: 0 .  ONETOUCH ULTRA test strip, , Disp: , Rfl:  .  rizatriptan (MAXALT-MLT) 10 MG disintegrating tablet, Take 1 tablet (10 mg total) by mouth as needed for migraine. May repeat in 2 hours if needed, Disp: 9 tablet, Rfl: 11 .  Semaglutide,0.25 or 0.5MG /DOS, (OZEMPIC, 0.25 OR 0.5 MG/DOSE,) 2 MG/1.5ML SOPN, Inject into the skin once a week., Disp: , Rfl:  .  sertraline (ZOLOFT) 50 MG tablet, Take one and a half tablets daily, Disp: 45 tablet, Rfl: 1 .  spironolactone (ALDACTONE) 100 MG tablet, Take 100 mg by mouth  daily., Disp: , Rfl:    Family History  Problem Relation Age of Onset  . Healthy Mother   . Hypertension Father   . Diabetes Father   . Heart disease Father   . Sarcoidosis Father   . Healthy Brother   . Obesity Brother   . Cancer Paternal Grandfather        ?prostate   . Hypertension Other      Social History   Tobacco Use  . Smoking status: Former Smoker    Types: Cigarettes  . Smokeless tobacco: Never Used  Substance Use Topics  . Alcohol use: Yes    Alcohol/week: 1.0 - 2.0 standard drinks    Types: 1 - 2 Glasses of wine per week    Comment: ocassional  . Drug use: No    Allergies as of 09/09/2019 - Review Complete 08/27/2019  Allergen Reaction Noted  . Sulfa antibiotics Hives 11/16/2014    Imaging Studies: Reviewed  Assessment and Plan:   Dawn Ortiz is a 29 y.o. female with metabolic syndrome, poorly controlled diabetes, s/p  laparoscopic cholecystectomy secondary to acute calculus cholecystitis in 06/2019 is connected on a video visit in consultation for dyspepsia and constipation.  Her symptoms are highly suggestive of diabetic gastroparesis.  However, other differentials include peptic ulcer disease or erosive esophagitis  Dyspepsia Recommend increase omeprazole to 40 mg 2 times a day before meals Recommend gastroparesis diet Trial of Reglan 5 mg before each meal and at bedtime Recommend EGD with biopsies for further evaluation If EGD is unremarkable, will perform gastric emptying study  Constipation Patient is not on opioid pain medications at this time Discussed with her about high-fiber diet, fiber supplements and stool softeners as needed  Patient instructions sent via MyChart   Follow Up Instructions:   I discussed the assessment and treatment plan with the patient. The patient was provided an opportunity to ask questions and all were answered. The patient agreed with the plan and demonstrated an understanding of the instructions.   The  patient was advised to call back or seek an in-person evaluation if the symptoms worsen or if the condition fails to improve as anticipated.  I provided 20 minutes of face-to-face time during this encounter.   Follow up in 4 to 6 weeks   Arlyss Repress, MD

## 2019-09-16 ENCOUNTER — Encounter: Payer: Self-pay | Admitting: Psychiatry

## 2019-09-16 ENCOUNTER — Other Ambulatory Visit: Payer: Self-pay

## 2019-09-16 ENCOUNTER — Ambulatory Visit (INDEPENDENT_AMBULATORY_CARE_PROVIDER_SITE_OTHER): Payer: 59 | Admitting: Psychiatry

## 2019-09-16 DIAGNOSIS — F418 Other specified anxiety disorders: Secondary | ICD-10-CM

## 2019-09-16 DIAGNOSIS — F900 Attention-deficit hyperactivity disorder, predominantly inattentive type: Secondary | ICD-10-CM

## 2019-09-16 MED ORDER — BUPROPION HCL ER (XL) 150 MG PO TB24: 150.0000 mg | ORAL_TABLET | ORAL | 0 refills | Status: DC

## 2019-09-16 NOTE — Progress Notes (Signed)
BH MD OP Progress Note  I connected with  Dawn Ortiz on 09/16/19 by a video enabled telemedicine application and verified that I am speaking with the correct person using two identifiers.   I discussed the limitations of evaluation and management by telemedicine. The patient expressed understanding and agreed to proceed.   09/16/2019 10:33 AM Dawn Ortiz  MRN:  [id number]  Chief Complaint:  " I am feeling very tired with sertraline."  HPI: Pt reported that sertraline has continued to make her feel quite tired.  He stated that she has always had low energy levels and with increased dose of sertraline she feels fatigued.  She had to leave work early a couple of days ago due to that.  She stated that she is using lorazepam only few times a week and her anxiety has been manageable for the most part. Patient asked about Wellbutrin as Clinical research associate had mentioned it to her in the past and also because she knows couple people who have taken it and not doing well. Writer pointed out that Wellbutrin is a good option to help her depressive symptoms however she may need more frequent use of lorazepam for anxiety control but we can see how she does as time progresses. She denied difficulty with sleep at night.  Visit Diagnosis:    ICD-10-CM   1. Depression with anxiety  F41.8 buPROPion (WELLBUTRIN XL) 150 MG 24 hr tablet  2. Attention deficit hyperactivity disorder (ADHD), predominantly inattentive type  F90.0     Past Psychiatric History: Depression, anxiety  Past Medical History:  Past Medical History:  Diagnosis Date  . Abscess of left breast   . Abscess of left thigh   . Abscess of right breast   . Abscess of right thigh   . Abscess of the breast and nipple 03/02/2019  . Anxiety   . Axillary abscess 07/02/2016  . Biliary colic   . Diabetes mellitus without complication (HCC)   . GERD (gastroesophageal reflux disease)   . Leukocytosis 07/19/2019    Past Surgical History:  Procedure  Laterality Date  . I & D EXTREMITY Left 03/05/2019   Procedure: Irrigation and debridement;  Surgeon: Henrene Dodge, MD;  Location: [facility];  Service: General;  Laterality: Left;  . INCISION AND DRAINAGE     right breast x1 and left groin x1 and left axilla  . INCISION AND DRAINAGE ABSCESS Left 07/02/2016   Procedure: INCISION AND DRAINAGE ABSCESS;  Surgeon: Lattie Haw, MD;  Location: [facility];  Service: General;  Laterality: Left;  . INCISION AND DRAINAGE ABSCESS Left 04/18/2017   Procedure: INCISION AND DRAINAGE ABSCESS-LEFT GROIN;  Surgeon: Ancil Linsey, MD;  Location: [facility];  Service: General;  Laterality: Left;  . INCISION AND DRAINAGE ABSCESS Right 05/21/2018   Procedure: INCISION AND DRAINAGE ABSCESS- RIGHT THIGH;  Surgeon: Henrene Dodge, MD;  Location: [facility];  Service: General;  Laterality: Right;  . IRRIGATION AND DEBRIDEMENT ABSCESS Right 04/09/2018   Procedure: IRRIGATION AND DEBRIDEMENT ABSCESS;  Surgeon: Henrene Dodge, MD;  Location: [facility];  Service: General;  Laterality: Right;  . IRRIGATION AND DEBRIDEMENT ABSCESS Left 05/21/2018   Procedure: IRRIGATION AND DEBRIDEMENT BREAST ABSCESS;  Surgeon: Henrene Dodge, MD;  Location: [facility];  Service: General;  Laterality: Left;  . PILONIDAL CYST EXCISION  12/03/2014   Procedure: CYST EXCISION PILONIDAL EXTENSIVE;  Surgeon: Duwaine Maxin, MD;  Location: [facility];  Service: General;;     Family History:  Family History  Problem Relation  Age of Onset  . Healthy Mother   . Hypertension Father   . Diabetes Father   . Heart disease Father   . Sarcoidosis Father   . Healthy Brother   . Obesity Brother   . Cancer Paternal Grandfather        ?prostate   . Hypertension Other     Social History:  Social History   Socioeconomic History  . Marital status: Single    Spouse name: Not on file  . Number of children: Not on file  . Years of education: Not on file  . Highest education level: Not on file  Occupational  History  . Not on file  Tobacco Use  . Smoking status: Former Smoker    Types: Cigarettes  . Smokeless tobacco: Never Used  Substance and Sexual Activity  . Alcohol use: Yes    Alcohol/week: 1.0 - 2.0 standard drinks    Types: 1 - 2 Glasses of wine per week    Comment: ocassional  . Drug use: No  . Sexual activity: Yes    Birth control/protection: Implant  Other Topics Concern  . Not on file  Social History Narrative   DPR mom Miki Labuda [phone]    Dad Marciana Uplinger [phone]    No kids    Works for [employer]   Social Determinants of Health   Financial Resource Strain:   . Difficulty of Paying Living Expenses: Not on file  Food Insecurity:   . Worried About Programme researcher, broadcasting/film/video in the Last Year: Not on file  . Ran Out of Food in the Last Year: Not on file  Transportation Needs:   . Lack of Transportation (Medical): Not on file  . Lack of Transportation (Non-Medical): Not on file  Physical Activity:   . Days of Exercise per Week: Not on file  . Minutes of Exercise per Session: Not on file  Stress:   . Feeling of Stress : Not on file  Social Connections:   . Frequency of Communication with Friends and Family: Not on file  . Frequency of Social Gatherings with Friends and Family: Not on file  . Attends Religious Services: Not on file  . Active Member of Clubs or Organizations: Not on file  . Attends Banker Meetings: Not on file  . Marital Status: Not on file    Allergies:  Allergies  Allergen Reactions  . Sulfa Antibiotics Hives    Metabolic Disorder Labs: Lab Results  Component Value Date   HGBA1C 8.9 (H) 07/19/2019   MPG 208.73 07/19/2019   No results found for: PROLACTIN Lab Results  Component Value Date   CHOL 127 02/26/2019   TRIG 96 02/26/2019   HDL 37 (L) 02/26/2019   CHOLHDL 3.4 02/26/2019   LDLCALC 71 02/26/2019   LDLCALC 94 10/21/2017   Lab Results  Component Value Date   TSH 0.773 02/26/2019   TSH 0.425 (L)  10/12/2016    Therapeutic Level Labs: No results found for: LITHIUM No results found for: VALPROATE No components found for:  CBMZ  Current Medications: Current Outpatient Medications  Medication Sig Dispense Refill  . amphetamine-dextroamphetamine (ADDERALL XR) 30 MG 24 hr capsule Take 1 capsule (30 mg total) by mouth every morning. 30 capsule 0  . [START ON 09/26/2019] amphetamine-dextroamphetamine (ADDERALL XR) 30 MG 24 hr capsule Take 1 capsule (30 mg total) by mouth every morning. 30 capsule 0  . B-D ULTRAFINE III SHORT PEN 31G X 8 MM  MISC     . buPROPion (WELLBUTRIN XL) 150 MG 24 hr tablet Take 1 tablet (150 mg total) by mouth every morning. 30 tablet 0  . Clindamycin Phosphate foam     . cyclobenzaprine (FLEXERIL) 10 MG tablet Take 1/2 pill to 1 pill at bedtime as needed 30 tablet 3  . ibuprofen (ADVIL) 800 MG tablet Take 1 tablet (800 mg total) by mouth every 8 (eight) hours as needed. 30 tablet 0  . Insulin Degludec (TRESIBA) 100 UNIT/ML SOLN Inject 70 Units into the skin daily.     . Lancets (ONETOUCH DELICA PLUS ENIDPO24M) [place]     . lisinopril (PRINIVIL,ZESTRIL) 2.5 MG tablet Take 2.5 mg by mouth daily.    Marland Kitchen LORazepam (ATIVAN) 0.5 MG tablet Take 1 tablet (0.5 mg total) by mouth 2 (two) times daily as needed for anxiety. 45 tablet 1  . metFORMIN (GLUCOPHAGE-XR) 500 MG 24 hr tablet Take 500 mg by mouth 2 (two) times daily.    . metoCLOPramide (REGLAN) 5 MG tablet Take 1 tablet (5 mg total) by mouth 4 (four) times daily -  before meals and at bedtime for 7 days. 28 tablet 0  . norelgestromin-ethinyl estradiol Marilu Favre) 150-35 MCG/24HR transdermal patch Place 1 patch onto the skin once a week. xulane patch    . omeprazole (PRILOSEC) 40 MG capsule Take 1 capsule (40 mg total) by mouth daily. 30 minutes before food 60 capsule 2  . ondansetron (ZOFRAN) 4 MG tablet Take 1 tablet (4 mg total) by mouth every 8 (eight) hours as needed for nausea or vomiting. 20 tablet 0  . ONETOUCH ULTRA  test strip     . rizatriptan (MAXALT-MLT) 10 MG disintegrating tablet Take 1 tablet (10 mg total) by mouth as needed for migraine. May repeat in 2 hours if needed 9 tablet 11  . Semaglutide,0.25 or 0.5MG /DOS, (OZEMPIC, 0.25 OR 0.5 MG/DOSE,) 2 MG/1.5ML SOPN Inject into the skin once a week.    . spironolactone (ALDACTONE) 100 MG tablet Take 100 mg by mouth daily.     No current facility-administered medications for this visit.     Musculoskeletal: Strength & Muscle Tone: unable to assess due to telemed visit Gait & Station: unable to assess due to telemed visit Patient leans: unable to assess due to telemed visit   Psychiatric Specialty Exam: ROS  There were no vitals taken for this visit.There is no height or weight on file to calculate BMI.  General Appearance: Well Groomed  Eye Contact:  Good  Speech:  Clear and Coherent and Normal Rate  Volume:  Normal  Mood:  Slightly depressed  Affect:  Congruent  Thought Process:  Goal Directed, Linear and Descriptions of Associations: Intact  Orientation:  Full (Time, Place, and Person)  Thought Content: Logical   Suicidal Thoughts:  No  Homicidal Thoughts:  No  Memory:  Recent;   Good Remote;   Good  Judgement:  Good  Insight:  Good  Psychomotor Activity:  Normal  Concentration:  Concentration: Good and Attention Span: Good  Recall:  Good  Fund of Knowledge: Good  Language: Good  Akathisia:  No  Handed:  Right  AIMS (if indicated): not done  Assets:  Communication Skills Desire for Improvement Financial Resources/Insurance Housing Social Support Talents/Skills Transportation  ADL's:  Intact  Cognition: WNL  Sleep:  Good    Assessment and Plan: 29 year old female with history of depression and anxiety and ADHD inattentive type now seen for follow-up.  Patient reported that increasing Zoloft  dose has resulted in increased fatigue and tired feeling for her.  She asked if she could try Wellbutrin as it was mentioned to her in  the past.  Would do a trial of Wellbutrin XL 150 mg every morning. Potential side effects of medication and risks vs benefits of treatment vs non-treatment were explained and discussed. All questions were answered.   1. Depression with anxiety  -Taper off Zoloft-take 1 tablet for 2 weeks and then take half tablet for 1 week and then discontinue. -Start Wellbutrin XL 150 mg every morning. - Continue Lorazepam 0.5 mg PRN, refills sent on January 7.  2. Attention deficit hyperactivity disorder (ADHD), predominantly inattentive type  -Continue amphetamine-dextroamphetamine (ADDERALL XR) 30 MG 24 hr capsule; Take 1 capsule (30 mg total) by mouth every morning.  Dispense: 30 capsule; Refill: 0 - amphetamine-dextroamphetamine (ADDERALL XR) 30 MG 24 hr capsule; Take 1 capsule (30 mg total) by mouth every morning.  Dispense: 30 capsule; Refill: 0   F/up in 4 weeks.  Zena Amos, MD 09/16/2019, 10:33 AM

## 2019-09-19 LAB — COMPREHENSIVE METABOLIC PANEL
ALT: 13 U/L (ref 0–33)
AST: 17 U/L (ref 0–32)
Albumin/Globulin Ratio: 2.3 mmol/L — ABNORMAL HIGH (ref 1.00–2.00)
Albumin: 5 g/dL (ref 3.5–5.2)
Alk Phosphatase: 70 U/L (ref 35–117)
Anion Gap: 10 mmol/L (ref 2–17)
BUN: 8 mg/dL (ref 6–20)
CO2: 26 mmol/L (ref 22–29)
Calcium: 9.7 mg/dL (ref 8.6–10.0)
Chloride: 104 mmol/L (ref 98–107)
Creatinine: 0.6 mg/dL (ref 0.5–1.0)
GFR African American: 144 mL/min/{1.73_m2} (ref >=90)
GFR Non-African American: 124 mL/min/{1.73_m2} (ref >=90)
Globulin: 2 g/dL (ref 1.9–4.4)
Glucose: 91 mg/dL (ref 70–99)
OSMOLALITY CALCULATED: 277 mOsm/kg (ref 270–287)
Potassium: 4.6 mmol/L (ref 3.5–5.3)
Sodium: 140 mmol/L (ref 135–145)
Total Bilirubin: 0.6 mg/dL (ref 0.00–1.20)
Total Protein: 7.2 g/dL (ref 6.4–8.3)

## 2019-09-19 LAB — CBC WITH AUTO DIFFERENTIAL
Absolute Baso #: 0 10*3/uL (ref 0.0–0.2)
Absolute Eos #: 0.1 10*3/uL (ref 0.0–0.5)
Absolute Lymph #: 1.7 10*3/uL (ref 1.0–3.2)
Absolute Mono #: 0.5 10*3/uL (ref 0.3–1.0)
Basophils %: 0.3 % (ref 0.0–2.0)
Eosinophils %: 0.7 % (ref 0.0–7.0)
Hematocrit: 42.6 % (ref 34.0–47.0)
Hemoglobin: 14.5 g/dL (ref 11.5–15.7)
Immature Grans (Abs): 0.01 10*3/uL (ref 0.00–0.06)
Immature Granulocytes: 0.1 % (ref 0.1–0.6)
Lymphocytes: 25.3 % (ref 15.0–45.0)
MCH: 29.7 pg (ref 27.0–34.5)
MCHC: 34 g/dL (ref 32.0–36.0)
MCV: 87.3 fL (ref 81.0–99.0)
MPV: 10.8 fL (ref 7.2–13.2)
Monocytes: 7.6 % (ref 4.0–12.0)
NRBC Absolute: 0 10*3/uL (ref 0.000–0.012)
NRBC Automated: 0 % (ref 0.0–0.2)
Neutrophils %: 66 % (ref 42.0–74.0)
Neutrophils Absolute: 4.5 10*3/uL (ref 1.6–7.3)
Platelets: 346 10*3/uL (ref 140–440)
RBC: 4.88 x10e6/mcL (ref 3.60–5.20)
RDW: 11.8 % (ref 11.0–16.0)
WBC: 6.9 10*3/uL (ref 3.8–10.6)

## 2019-09-19 LAB — LIPID PANEL
Chol/HDL Ratio: 5.1 — ABNORMAL HIGH (ref 0.0–4.4)
Cholesterol: 234 mg/dL — ABNORMAL HIGH (ref 100–200)
HDL: 46 mg/dL — ABNORMAL LOW (ref 65–96)
LDL Cholesterol: 149.8 mg/dL — ABNORMAL HIGH (ref 0.0–100.0)
LDL/HDL Ratio: 3.3
Triglycerides: 191 mg/dL — ABNORMAL HIGH (ref 0–149)
VLDL: 38.2 mg/dL (ref 5.0–40.0)

## 2019-09-19 LAB — URINALYSIS W/ RFLX MICROSCOPIC
Bilirubin Urine: NEGATIVE
Blood, Urine: NEGATIVE
Glucose, UA: NEGATIVE mg/dL
Ketones, Urine: NEGATIVE mg/dL
Nitrite, Urine: NEGATIVE
Protein, UA: NEGATIVE
Specific Gravity, UA: 1.002 (ref 1.003–1.035)
Urobilinogen, Urine: 0.2 EU/dL
pH, UA: 6.5 (ref 4.5–8.0)

## 2019-09-19 LAB — MICROSCOPIC URINALYSIS
Amorphous, UA: NONE SEEN /HPF
MUCUS, URINE: NONE SEEN /LPF

## 2019-09-19 LAB — TSH WITH REFLEX TO FT4: TSH: 1.69 mcIU/mL (ref 0.358–3.740)

## 2019-09-22 LAB — SEDIMENTATION RATE: Sed Rate: 6 mm/hr (ref 0–20)

## 2019-09-22 LAB — CK: Total CK: 79 U/L (ref 20–180)

## 2019-09-22 LAB — C-REACTIVE PROTEIN: CRP: 0.72 mg/dL — ABNORMAL HIGH (ref 0.00–0.50)

## 2019-09-22 LAB — RHEUMATOID FACTOR: Rheumatoid Factor Quant: <10 IU/mL (ref 0.0–19.0)

## 2019-09-23 LAB — THYROID ANTIBODIES
Thyroglobulin Ab: <1 IU/mL (ref 0.0–0.9)
Thyroid Peroxidase (TPO) Abs: <9 IU/mL (ref 0–34)

## 2019-09-23 LAB — ANA PROFILE
ANTI-CHROMATIN ANTIBODY: <0.2 AI (ref 0.0–0.9)
Anti ds DNA: 1 IU/mL (ref 0–9)
Anti-Centromere B Antibodies: <0.2 AI (ref 0.0–0.9)
Anti-Smith: <0.2 AI (ref 0.0–0.9)
Jo-1 Antibody: <0.2 AI (ref 0.0–0.9)
RNP Antibody: 0.2 AI (ref 0.0–0.9)
Scleroderma SCL-70: <0.2 AI (ref 0.0–0.9)
Sjogren's Antibodies (SSA): <0.2 AI (ref 0.0–0.9)
Sjogren's Antibodies (SSB): <0.2 AI (ref 0.0–0.9)

## 2019-09-24 LAB — CYCLIC CITRUL PEPTIDE ANTIBODY, IGG: CCP Antibodies IgG/IgA: 7 units (ref 0–19)

## 2019-09-24 LAB — ANA: ANA by IFA: NEGATIVE

## 2019-09-28 ENCOUNTER — Other Ambulatory Visit: Payer: Self-pay

## 2019-09-28 ENCOUNTER — Other Ambulatory Visit
Admission: RE | Admit: 2019-09-28 | Discharge: 2019-09-28 | Disposition: A | Discharge status: Home / Self Care | Payer: Managed Care, Other (non HMO) | Source: Ambulatory Visit | Attending: Gastroenterology | Admitting: Gastroenterology

## 2019-09-28 DIAGNOSIS — Z01812 Encounter for preprocedural laboratory examination: Secondary | ICD-10-CM | POA: Diagnosis present

## 2019-09-28 DIAGNOSIS — Z20822 Contact with and (suspected) exposure to covid-19: Secondary | ICD-10-CM | POA: Diagnosis not present

## 2019-09-28 LAB — HLA-B27 ANTIGEN: HLA B27: NEGATIVE

## 2019-09-29 ENCOUNTER — Other Ambulatory Visit: Payer: Managed Care, Other (non HMO)

## 2019-09-29 LAB — SARS CORONAVIRUS 2 (TAT 6-24 HRS): SARS Coronavirus 2: NEGATIVE

## 2019-10-01 ENCOUNTER — Encounter: Payer: Self-pay | Admitting: Gastroenterology

## 2019-10-01 ENCOUNTER — Encounter
Admission: RE | Disposition: A | Discharge status: Home / Self Care | Payer: Self-pay | Source: Home / Self Care | Attending: Gastroenterology

## 2019-10-01 ENCOUNTER — Other Ambulatory Visit: Payer: Self-pay

## 2019-10-01 ENCOUNTER — Ambulatory Visit: Payer: Managed Care, Other (non HMO) | Admitting: Anesthesiology

## 2019-10-01 ENCOUNTER — Ambulatory Visit
Admission: RE | Admit: 2019-10-01 | Discharge: 2019-10-01 | Disposition: A | Discharge status: Home / Self Care | Payer: Managed Care, Other (non HMO) | Attending: Gastroenterology | Admitting: Gastroenterology

## 2019-10-01 DIAGNOSIS — Z87891 Personal history of nicotine dependence: Secondary | ICD-10-CM | POA: Diagnosis not present

## 2019-10-01 DIAGNOSIS — Z79899 Other long term (current) drug therapy: Secondary | ICD-10-CM | POA: Diagnosis not present

## 2019-10-01 DIAGNOSIS — K319 Disease of stomach and duodenum, unspecified: Secondary | ICD-10-CM | POA: Insufficient documentation

## 2019-10-01 DIAGNOSIS — F329 Major depressive disorder, single episode, unspecified: Secondary | ICD-10-CM | POA: Diagnosis not present

## 2019-10-01 DIAGNOSIS — Z793 Long term (current) use of hormonal contraceptives: Secondary | ICD-10-CM | POA: Diagnosis not present

## 2019-10-01 DIAGNOSIS — G43909 Migraine, unspecified, not intractable, without status migrainosus: Secondary | ICD-10-CM | POA: Insufficient documentation

## 2019-10-01 DIAGNOSIS — E119 Type 2 diabetes mellitus without complications: Secondary | ICD-10-CM | POA: Insufficient documentation

## 2019-10-01 DIAGNOSIS — R1013 Epigastric pain: Secondary | ICD-10-CM | POA: Diagnosis present

## 2019-10-01 DIAGNOSIS — K3189 Other diseases of stomach and duodenum: Secondary | ICD-10-CM

## 2019-10-01 DIAGNOSIS — F419 Anxiety disorder, unspecified: Secondary | ICD-10-CM | POA: Diagnosis not present

## 2019-10-01 DIAGNOSIS — K219 Gastro-esophageal reflux disease without esophagitis: Secondary | ICD-10-CM | POA: Insufficient documentation

## 2019-10-01 DIAGNOSIS — I10 Essential (primary) hypertension: Secondary | ICD-10-CM | POA: Insufficient documentation

## 2019-10-01 DIAGNOSIS — Z794 Long term (current) use of insulin: Secondary | ICD-10-CM | POA: Insufficient documentation

## 2019-10-01 HISTORY — PX: ESOPHAGOGASTRODUODENOSCOPY (EGD) WITH PROPOFOL: SHX5813

## 2019-10-01 HISTORY — DX: Migraine, unspecified, not intractable, without status migrainosus: G43.909

## 2019-10-01 LAB — GLUCOSE, CAPILLARY
Glucose-Capillary: 124 mg/dL — ABNORMAL HIGH (ref 70–99)
Glucose-Capillary: 69 mg/dL — ABNORMAL LOW (ref 70–99)
Glucose-Capillary: 73 mg/dL (ref 70–99)
Glucose-Capillary: 87 mg/dL (ref 70–99)

## 2019-10-01 LAB — POCT PREGNANCY, URINE: Preg Test, Ur: NEGATIVE

## 2019-10-01 SURGERY — ESOPHAGOGASTRODUODENOSCOPY (EGD) WITH PROPOFOL
Anesthesia: General

## 2019-10-01 MED ORDER — DEXTROSE-NACL 5-0.45 % IV SOLN: INTRAVENOUS | Status: DC

## 2019-10-01 MED ORDER — PROPOFOL 500 MG/50ML IV EMUL
INTRAVENOUS | Status: DC | PRN
  Administered 2019-10-01: 175 ug/kg/min via INTRAVENOUS

## 2019-10-01 MED ORDER — SODIUM CHLORIDE 0.9 % IV SOLN: INTRAVENOUS | Status: DC | PRN

## 2019-10-01 MED ORDER — SODIUM CHLORIDE 0.9 % IV SOLN: INTRAVENOUS | Status: DC

## 2019-10-01 MED ORDER — PROPOFOL 10 MG/ML IV BOLUS
INTRAVENOUS | Status: DC | PRN
  Administered 2019-10-01: 40 mg via INTRAVENOUS
  Administered 2019-10-01: 60 mg via INTRAVENOUS

## 2019-10-01 MED ORDER — LIDOCAINE HCL (PF) 2 % IJ SOLN
INTRAMUSCULAR | Status: DC | PRN
  Administered 2019-10-01: 100 mg via INTRADERMAL

## 2019-10-01 NOTE — Anesthesia Procedure Notes (Signed)
Date/Time: 10/01/2019 10:01 AM Performed by: Junious Silk, CRNA Pre-anesthesia Checklist: Patient identified, Emergency Drugs available, Suction available, Patient being monitored and Timeout performed Oxygen Delivery Method: Nasal cannula

## 2019-10-01 NOTE — H&P (Signed)
Arlyss Repress, MD [address]  [address]  [place], [place] [address]  Main: [phone]  Fax: [phone] Pager: [phone]  Primary Care Physician:  McLean-Scocuzza, Pasty Spillers, MD Primary Gastroenterologist:  Dr. Arlyss Repress  Pre-Procedure History & Physical: HPI:  Dawn Ortiz is a 29 y.o. female is here for an endoscopy.   Past Medical History:  Diagnosis Date  . Abscess of left breast   . Abscess of left thigh   . Abscess of right breast   . Abscess of right thigh   . Abscess of the breast and nipple 03/02/2019  . Anxiety   . Axillary abscess 07/02/2016  . Biliary colic   . Diabetes mellitus without complication (HCC)   . GERD (gastroesophageal reflux disease)   . Leukocytosis 07/19/2019  . Migraines     Past Surgical History:  Procedure Laterality Date  . CHOLECYSTECTOMY    . I & D EXTREMITY Left 03/05/2019   Procedure: Irrigation and debridement;  Surgeon: Henrene Dodge, MD;  Location: [facility];  Service: General;  Laterality: Left;  . INCISION AND DRAINAGE     right breast x1 and left groin x1 and left axilla  . INCISION AND DRAINAGE ABSCESS Left 07/02/2016   Procedure: INCISION AND DRAINAGE ABSCESS;  Surgeon: Lattie Haw, MD;  Location: [facility];  Service: General;  Laterality: Left;  . INCISION AND DRAINAGE ABSCESS Left 04/18/2017   Procedure: INCISION AND DRAINAGE ABSCESS-LEFT GROIN;  Surgeon: Ancil Linsey, MD;  Location: [facility];  Service: General;  Laterality: Left;  . INCISION AND DRAINAGE ABSCESS Right 05/21/2018   Procedure: INCISION AND DRAINAGE ABSCESS- RIGHT THIGH;  Surgeon: Henrene Dodge, MD;  Location: [facility];  Service: General;  Laterality: Right;  . IRRIGATION AND DEBRIDEMENT ABSCESS Right 04/09/2018   Procedure: IRRIGATION AND DEBRIDEMENT ABSCESS;  Surgeon: Henrene Dodge, MD;  Location: [facility];  Service: General;  Laterality: Right;  . IRRIGATION AND DEBRIDEMENT ABSCESS Left 05/21/2018   Procedure: IRRIGATION AND DEBRIDEMENT  BREAST ABSCESS;  Surgeon: Henrene Dodge, MD;  Location: [facility];  Service: General;  Laterality: Left;  . PILONIDAL CYST EXCISION  12/03/2014   Procedure: CYST EXCISION PILONIDAL EXTENSIVE;  Surgeon: Duwaine Maxin, MD;  Location: [facility];  Service: General;;    Prior to Admission medications   Medication Sig Start Date End Date Taking? Authorizing Provider  amphetamine-dextroamphetamine (ADDERALL XR) 30 MG 24 hr capsule Take 1 capsule (30 mg total) by mouth every morning. 08/27/19  Yes Zena Amos, MD  buPROPion (WELLBUTRIN XL) 150 MG 24 hr tablet Take 1 tablet (150 mg total) by mouth every morning. 09/16/19  Yes Zena Amos, MD  lisinopril (PRINIVIL,ZESTRIL) 2.5 MG tablet Take 2.5 mg by mouth daily.   Yes [provider]  metFORMIN (GLUCOPHAGE-XR) 500 MG 24 hr tablet Take 500 mg by mouth 2 (two) times daily. 07/03/19  Yes [provider]  norelgestromin-ethinyl estradiol Burr Medico) 150-35 MCG/24HR transdermal patch Place 1 patch onto the skin once a week. xulane patch   Yes [provider]  omeprazole (PRILOSEC) 40 MG capsule Take 1 capsule (40 mg total) by mouth daily. 30 minutes before food 02/03/19  Yes McLean-Scocuzza, Pasty Spillers, MD  rizatriptan (MAXALT-MLT) 10 MG disintegrating tablet Take 1 tablet (10 mg total) by mouth as needed for migraine. May repeat in 2 hours if needed 06/10/19  Yes Huston Foley, MD  amphetamine-dextroamphetamine (ADDERALL XR) 30 MG 24 hr capsule Take 1 capsule (30 mg total) by mouth every morning. 09/26/19  [place] Crane, MD  B-D ULTRAFINE III SHORT PEN 31G X 8 MM MISC  07/28/19   [provider]  Clindamycin Phosphate foam  07/28/19   [provider]  cyclobenzaprine (FLEXERIL) 10 MG tablet Take 1/2 pill to 1 pill at bedtime as needed 06/10/19   Star Age, MD  ibuprofen (ADVIL) 800 MG tablet Take 1 tablet (800 mg total) by mouth every 8 (eight) hours as needed. 07/22/19   Tylene Fantasia, PA-C  Insulin Degludec  (TRESIBA) 100 UNIT/ML SOLN Inject 70 Units into the skin daily.     [provider]  Lancets (ONETOUCH DELICA PLUS [id number]) [place]  07/28/19   [provider]  LORazepam (ATIVAN) 0.5 MG tablet Take 1 tablet (0.5 mg total) by mouth 2 (two) times daily as needed for anxiety. 08/06/19   [place] Crane, MD  metoCLOPramide (REGLAN) 5 MG tablet Take 1 tablet (5 mg total) by mouth 4 (four) times daily -  before meals and at bedtime for 7 days. 09/09/19 09/16/19  Lin Landsman, MD  ondansetron (ZOFRAN) 4 MG tablet Take 1 tablet (4 mg total) by mouth every 8 (eight) hours as needed for nausea or vomiting. 07/06/19   Star Age, MD  ONETOUCH ULTRA test strip  07/28/19   [provider]  Semaglutide,0.25 or 0.5MG /DOS, (OZEMPIC, 0.25 OR 0.5 MG/DOSE,) 2 MG/1.5ML SOPN Inject into the skin once a week.    [provider]  spironolactone (ALDACTONE) 100 MG tablet Take 100 mg by mouth daily.    [provider]    Allergies as of 09/09/2019 - Review Complete 08/27/2019  Allergen Reaction Noted  . Sulfa antibiotics Hives 11/16/2014    Family History  Problem Relation Age of Onset  . Healthy Mother   . Hypertension Father   . Diabetes Father   . Heart disease Father   . Sarcoidosis Father   . Healthy Brother   . Obesity Brother   . Cancer Paternal Grandfather        ?prostate   . Hypertension Other     Social History   Socioeconomic History  . Marital status: Single    Spouse name: Not on file  . Number of children: Not on file  . Years of education: Not on file  . Highest education level: Not on file  Occupational History  . Not on file  Tobacco Use  . Smoking status: Former Smoker    Types: Cigarettes  . Smokeless tobacco: Never Used  Substance and Sexual Activity  . Alcohol use: Yes    Alcohol/week: 1.0 - 2.0 standard drinks    Types: 1 - 2 Glasses of wine per week    Comment: none last 24hrs  . Drug use: No  . Sexual activity: Yes     Birth control/protection: Implant  Other Topics Concern  . Not on file  Social History Narrative   DPR mom Tanessa Tidd [phone]    Dad Lashundra Shiveley [phone]    No kids    Works for [employer] Strain:   . Difficulty of Paying Living Expenses: Not on file  Food Insecurity:   . Worried About Charity fundraiser in the Last Year: Not on file  . Ran Out of Food in the Last Year: Not on file  Transportation Needs:   . Lack of Transportation (Medical): Not on file  . Lack of Transportation (Non-Medical): Not on file  Physical  Activity:   . Days of Exercise per Week: Not on file  . Minutes of Exercise per Session: Not on file  Stress:   . Feeling of Stress : Not on file  Social Connections:   . Frequency of Communication with Friends and Family: Not on file  . Frequency of Social Gatherings with Friends and Family: Not on file  . Attends Religious Services: Not on file  . Active Member of Clubs or Organizations: Not on file  . Attends Banker Meetings: Not on file  . Marital Status: Not on file  Intimate Partner Violence:   . Fear of Current or Ex-Partner: Not on file  . Emotionally Abused: Not on file  . Physically Abused: Not on file  . Sexually Abused: Not on file    Review of Systems: See HPI, otherwise negative ROS  Physical Exam: BP 128/85   Pulse (!) 101   Temp (!) 97.3 F (36.3 C) (Temporal)   Resp 20   Ht 5\' 2"  (1.575 m)   Wt 75.3 kg   LMP 09/30/2019   SpO2 100%   BMI 30.36 kg/m  General:   Alert,  pleasant and cooperative in NAD Head:  Normocephalic and atraumatic. Neck:  Supple; no masses or thyromegaly. Lungs:  Clear throughout to auscultation.    Heart:  Regular rate and rhythm. Abdomen:  Soft, nontender and nondistended. Normal bowel sounds, without guarding, and without rebound.   Neurologic:  Alert and  oriented x4;  grossly normal neurologically.  Impression/Plan: Dawn Ortiz is here for an endoscopy to be performed for dyspepsia  Risks, benefits, limitations, and alternatives regarding  endoscopy have been reviewed with the patient.  Questions have been answered.  All parties agreeable.   [name], MD  10/01/2019, 9:55 AM

## 2019-10-01 NOTE — Anesthesia Postprocedure Evaluation (Signed)
Anesthesia Post Note  Patient: Dawn Ortiz  Procedure(s) Performed: ESOPHAGOGASTRODUODENOSCOPY (EGD) WITH PROPOFOL (N/A )  Patient location during evaluation: Endoscopy Anesthesia Type: General Level of consciousness: awake and alert Pain management: pain level controlled Vital Signs Assessment: post-procedure vital signs reviewed and stable Respiratory status: spontaneous breathing and respiratory function stable Cardiovascular status: stable Anesthetic complications: no     Last Vitals:  Vitals:   10/01/19 1020 10/01/19 1030  BP: 101/60 116/72  Pulse: (!) 104 (!) 104  Resp: (!) 22 18  Temp:    SpO2: 100% 100%    Last Pain:  Vitals:   10/01/19 1010  TempSrc: Temporal  PainSc:                  Loranzo Desha K

## 2019-10-01 NOTE — Op Note (Signed)
[facility] Gastroenterology Patient Name: Dawn Ortiz Procedure Date: 10/01/2019 9:46 AM MRN: [id number] Account #: [id number] Date of Birth: [birth year] Admit Type: Outpatient Age: 29 Room: [facility] ENDO ROOM 1 Gender: Female Note Status: Finalized Procedure:             Upper GI endoscopy Indications:           Dyspepsia Providers:             Lin Landsman MD, MD Medicines:             Monitored Anesthesia Care Complications:         No immediate complications. Estimated blood loss: None. Procedure:             Pre-Anesthesia Assessment:                        - Prior to the procedure, a History and Physical was                         performed, and patient medications and allergies were                         reviewed. The patient is competent. The risks and                         benefits of the procedure and the sedation options and                         risks were discussed with the patient. All questions                         were answered and informed consent was obtained.                         Patient identification and proposed procedure were                         verified by the physician, the nurse, the                         anesthesiologist, the anesthetist and the technician                         in the pre-procedure area in the procedure room in the                         endoscopy suite. Mental Status Examination: alert and                         oriented. Airway Examination: normal oropharyngeal                         airway and neck mobility. Respiratory Examination:                         clear to auscultation. CV Examination: normal.                         Prophylactic Antibiotics: The patient does not require  prophylactic antibiotics. Prior Anticoagulants: The                         patient has taken no previous anticoagulant or                         antiplatelet agents. ASA Grade Assessment:  III - A                         patient with severe systemic disease. After reviewing                         the risks and benefits, the patient was deemed in                         satisfactory condition to undergo the procedure. The                         anesthesia plan was to use monitored anesthesia care                         (MAC). Immediately prior to administration of                         medications, the patient was re-assessed for adequacy                         to receive sedatives. The heart rate, respiratory                         rate, oxygen saturations, blood pressure, adequacy of                         pulmonary ventilation, and response to care were                         monitored throughout the procedure. The physical                         status of the patient was re-assessed after the                         procedure.                        After obtaining informed consent, the endoscope was                         passed under direct vision. Throughout the procedure,                         the patient's blood pressure, pulse, and oxygen                         saturations were monitored continuously. The Endoscope                         was introduced through the mouth, and advanced to the  second part of duodenum. The upper GI endoscopy was                         accomplished without difficulty. The patient tolerated                         the procedure well. Findings:      The duodenal bulb and second portion of the duodenum were normal.       Biopsies were taken with a cold forceps for histology.      A few dispersed diminutive erosions with no bleeding and no stigmata of       recent bleeding were found in the gastric antrum. Biopsies were taken       with a cold forceps for Helicobacter pylori testing.      Diffuse mildly erythematous mucosa without bleeding was found in the       gastric antrum.      The cardia,  gastric fundus, gastric body and incisura were normal.       Biopsies were taken with a cold forceps for Helicobacter pylori testing.      The cardia and gastric fundus were normal on retroflexion.      The gastroesophageal junction and examined esophagus were normal. Impression:            - Normal duodenal bulb and second portion of the                         duodenum. Biopsied.                        - Erosive gastropathy with no bleeding and no stigmata                         of recent bleeding. Biopsied.                        - Erythematous mucosa in the antrum.                        - Normal cardia, gastric fundus, gastric body and                         incisura. Biopsied.                        - Normal gastroesophageal junction and esophagus. Recommendation:        - Discharge patient to home (with escort).                        - Diabetic (ADA) diet and gastroparesis diet.                        - Continue present medications.                        - Await pathology results.                        - Return to my office as previously scheduled. Procedure Code(s):     --- Professional ---  03524, Esophagogastroduodenoscopy, flexible,                         transoral; with biopsy, single or multiple Diagnosis Code(s):     --- Professional ---                        K31.89, Other diseases of stomach and duodenum                        R10.13, Epigastric pain CPT copyright 2019 American Medical Association. All rights reserved. The codes documented in this report are preliminary and upon coder review may  be revised to meet current compliance requirements. Dr. Ulyess Mort Lin Landsman MD, MD 10/01/2019 10:09:41 AM This report has been signed electronically. Number of Addenda: 0 Note Initiated On: 10/01/2019 9:46 AM Estimated Blood Loss:  Estimated blood loss: none.      [facility]

## 2019-10-01 NOTE — Anesthesia Preprocedure Evaluation (Signed)
Anesthesia Evaluation  Patient identified by MRN, date of birth, ID band Patient awake    Reviewed: Allergy & Precautions, NPO status , Patient's Chart, lab work & pertinent test results  History of Anesthesia Complications Negative for: history of anesthetic complications  Airway Mallampati: II       Dental   Pulmonary neg sleep apnea, neg COPD, Not current smoker, former smoker,           Cardiovascular hypertension, Pt. on medications (-) Past MI and (-) CHF (-) dysrhythmias (-) Valvular Problems/Murmurs     Neuro/Psych neg Seizures Anxiety Depression    GI/Hepatic Neg liver ROS, GERD  Medicated and Controlled,  Endo/Other  diabetes, Type 2, Oral Hypoglycemic Agents  Renal/GU negative Renal ROS     Musculoskeletal   Abdominal   Peds  Hematology   Anesthesia Other Findings   Reproductive/Obstetrics                             Anesthesia Physical Anesthesia Plan  ASA: III  Anesthesia Plan: General   Post-op Pain Management:    Induction: Intravenous  PONV Risk Score and Plan: 3 and Propofol infusion, TIVA and Treatment may vary due to age or medical condition  Airway Management Planned: Nasal Cannula  Additional Equipment:   Intra-op Plan:   Post-operative Plan:   Informed Consent: I have reviewed the patients History and Physical, chart, labs and discussed the procedure including the risks, benefits and alternatives for the proposed anesthesia with the patient or authorized representative who has indicated his/her understanding and acceptance.       Plan Discussed with:   Anesthesia Plan Comments:         Anesthesia Quick Evaluation

## 2019-10-01 NOTE — Transfer of Care (Signed)
Immediate Anesthesia Transfer of Care Note  Patient: Dawn Ortiz  Procedure(s) Performed: ESOPHAGOGASTRODUODENOSCOPY (EGD) WITH PROPOFOL (N/A )  Patient Location: PACU  Anesthesia Type:General  Level of Consciousness: sedated  Airway & Oxygen Therapy: Patient Spontanous Breathing and Patient connected to nasal cannula oxygen  Post-op Assessment: Report given to RN and Post -op Vital signs reviewed and stable  Post vital signs: Reviewed and stable  Last Vitals:  Vitals Value Taken Time  BP    Temp    Pulse 106 10/01/19 1011  Resp 16 10/01/19 1011  SpO2 100 % 10/01/19 1011  Vitals shown include unvalidated device data.  Last Pain:  Vitals:   10/01/19 0950  TempSrc: Temporal  PainSc: 0-No pain         Complications: No apparent anesthesia complications

## 2019-10-02 ENCOUNTER — Encounter: Payer: Self-pay | Admitting: *Deleted

## 2019-10-02 LAB — SURGICAL PATHOLOGY

## 2019-10-06 ENCOUNTER — Ambulatory Visit: Payer: 59 | Admitting: Psychiatry

## 2019-10-08 ENCOUNTER — Ambulatory Visit: Payer: Managed Care, Other (non HMO) | Admitting: *Deleted

## 2019-10-14 ENCOUNTER — Other Ambulatory Visit: Payer: Self-pay

## 2019-10-14 ENCOUNTER — Ambulatory Visit (INDEPENDENT_AMBULATORY_CARE_PROVIDER_SITE_OTHER): Payer: 59 | Admitting: Psychiatry

## 2019-10-14 ENCOUNTER — Encounter: Payer: Self-pay | Admitting: Psychiatry

## 2019-10-14 DIAGNOSIS — F900 Attention-deficit hyperactivity disorder, predominantly inattentive type: Secondary | ICD-10-CM | POA: Diagnosis not present

## 2019-10-14 DIAGNOSIS — F418 Other specified anxiety disorders: Secondary | ICD-10-CM

## 2019-10-14 MED ORDER — LORAZEPAM 0.5 MG PO TABS: 0.5000 mg | ORAL_TABLET | Freq: Two times a day (BID) | ORAL | 1 refills | Status: DC | PRN

## 2019-10-14 MED ORDER — AMPHETAMINE-DEXTROAMPHET ER 20 MG PO CP24: ORAL_CAPSULE | ORAL | 0 refills | Status: DC

## 2019-10-14 MED ORDER — BUPROPION HCL ER (XL) 300 MG PO TB24: 300.0000 mg | ORAL_TABLET | ORAL | 0 refills | Status: DC

## 2019-10-14 NOTE — Progress Notes (Signed)
BH MD OP Progress Note  I connected with  [name] on 10/14/19 by a video enabled telemedicine application and verified that I am speaking with the correct person using two identifiers.   I discussed the limitations of evaluation and management by telemedicine. The patient expressed understanding and agreed to proceed.   10/14/2019 8:53 AM Dawn Ortiz  MRN:  [id number]  Chief Complaint:  " I can tell that this combination is definitely working well for me."  HPI: Pt reported that starting Wellbutrin XL has been helpful.  She stated that she has noticed improvement in her energy levels and also her mood.  She stated that she feels better on Wellbutrin as compared to being on sertraline.  She was able to taper off sertraline and is no longer on that.  She rated her mood to be 7 out of 10, 10 being very happy. She stated that she feels that she can still use a little bit of an increase in her dose of Adderall XR for optimal management of poor concentration in the late afternoon.  She also asked if she could increase the dose of Wellbutrin XL as she feels a little bit increase would help her even better. Patient has an appointment with endocrinology in a few weeks to address her diabetes mellitus.  Patient was asked if she thinks her low energy and fatigue could have been attributable to her diabetes and patient stated that since it is better now she thinks maybe it was in her diabetes. She uses lorazepam only as needed not more than 1-2 times per week. Patient requested for a longer gap in her appointment this time as she feels that adjustment in her medication dosage will help her immensely.   Visit Diagnosis:    ICD-10-CM   1. Attention deficit hyperactivity disorder (ADHD), predominantly inattentive type  F90.0   2. Depression with anxiety  F41.8     Past Psychiatric History: Depression, anxiety  Past Medical History:  Past Medical History:  Diagnosis Date  . Abscess of left  breast   . Abscess of left thigh   . Abscess of right breast   . Abscess of right thigh   . Abscess of the breast and nipple 03/02/2019  . Anxiety   . Axillary abscess 07/02/2016  . Biliary colic   . Diabetes mellitus without complication (HCC)   . GERD (gastroesophageal reflux disease)   . Leukocytosis 07/19/2019  . Migraines     Past Surgical History:  Procedure Laterality Date  . CHOLECYSTECTOMY    . ESOPHAGOGASTRODUODENOSCOPY (EGD) WITH PROPOFOL N/A 10/01/2019   Procedure: ESOPHAGOGASTRODUODENOSCOPY (EGD) WITH PROPOFOL;  Surgeon: Toney Reil, MD;  Location: [facility] ENDOSCOPY;  Service: Gastroenterology;  Laterality: N/A;  . I & D EXTREMITY Left 03/05/2019   Procedure: Irrigation and debridement;  Surgeon: Henrene Dodge, MD;  Location: [facility] ORS;  Service: General;  Laterality: Left;  . INCISION AND DRAINAGE     right breast x1 and left groin x1 and left axilla  . INCISION AND DRAINAGE ABSCESS Left 07/02/2016   Procedure: INCISION AND DRAINAGE ABSCESS;  Surgeon: Lattie Haw, MD;  Location: [facility] ORS;  Service: General;  Laterality: Left;  . INCISION AND DRAINAGE ABSCESS Left 04/18/2017   Procedure: INCISION AND DRAINAGE ABSCESS-LEFT GROIN;  Surgeon: Ancil Linsey, MD;  Location: [facility] ORS;  Service: General;  Laterality: Left;  . INCISION AND DRAINAGE ABSCESS Right 05/21/2018   Procedure: INCISION AND DRAINAGE ABSCESS- RIGHT THIGH;  Surgeon: Aleen Campi,  Jacqulyn Bath, MD;  Location: [facility] ORS;  Service: General;  Laterality: Right;  . IRRIGATION AND DEBRIDEMENT ABSCESS Right 04/09/2018   Procedure: IRRIGATION AND DEBRIDEMENT ABSCESS;  Surgeon: Olean Ree, MD;  Location: [facility] ORS;  Service: General;  Laterality: Right;  . IRRIGATION AND DEBRIDEMENT ABSCESS Left 05/21/2018   Procedure: IRRIGATION AND DEBRIDEMENT BREAST ABSCESS;  Surgeon: Olean Ree, MD;  Location: [facility] ORS;  Service: General;  Laterality: Left;  . PILONIDAL CYST EXCISION  12/03/2014   Procedure: CYST EXCISION PILONIDAL  EXTENSIVE;  Surgeon: Molly Maduro, MD;  Location: [facility] ORS;  Service: General;;     Family History:  Family History  Problem Relation Age of Onset  . Healthy Mother   . Hypertension Father   . Diabetes Father   . Heart disease Father   . Sarcoidosis Father   . Healthy Brother   . Obesity Brother   . Cancer Paternal Grandfather        ?prostate   . Hypertension Other     Social History:  Social History   Socioeconomic History  . Marital status: Single    Spouse name: Not on file  . Number of children: Not on file  . Years of education: Not on file  . Highest education level: Not on file  Occupational History  . Not on file  Tobacco Use  . Smoking status: Former Smoker    Types: Cigarettes  . Smokeless tobacco: Never Used  Substance and Sexual Activity  . Alcohol use: Yes    Alcohol/week: 1.0 - 2.0 standard drinks    Types: 1 - 2 Glasses of wine per week    Comment: none last 24hrs  . Drug use: No  . Sexual activity: Yes    Birth control/protection: Implant  Other Topics Concern  . Not on file  Social History Narrative   DPR mom Cherylene Ferrufino [phone]    Dad Adana Marik [phone]    No kids    Works for [employer] Strain:   . Difficulty of Paying Living Expenses:   Food Insecurity:   . Worried About Charity fundraiser in the Last Year:   . Arboriculturist in the Last Year:   Transportation Needs:   . Film/video editor (Medical):   Marland Kitchen Lack of Transportation (Non-Medical):   Physical Activity:   . Days of Exercise per Week:   . Minutes of Exercise per Session:   Stress:   . Feeling of Stress :   Social Connections:   . Frequency of Communication with Friends and Family:   . Frequency of Social Gatherings with Friends and Family:   . Attends Religious Services:   . Active Member of Clubs or Organizations:   . Attends Archivist Meetings:   Marland Kitchen Marital Status:      Allergies:  Allergies  Allergen Reactions  . Sulfa Antibiotics Hives    Metabolic Disorder Labs: Lab Results  Component Value Date   HGBA1C 8.9 (H) 07/19/2019   MPG 208.73 07/19/2019   No results found for: PROLACTIN Lab Results  Component Value Date   CHOL 127 02/26/2019   TRIG 96 02/26/2019   HDL 37 (L) 02/26/2019   CHOLHDL 3.4 02/26/2019   LDLCALC 71 02/26/2019   LDLCALC 94 10/21/2017   Lab Results  Component Value Date   TSH 0.773 02/26/2019   TSH 0.425 (L) 10/12/2016    Therapeutic Level Labs: No results  found for: LITHIUM No results found for: VALPROATE No components found for:  CBMZ  Current Medications: Current Outpatient Medications  Medication Sig Dispense Refill  . amphetamine-dextroamphetamine (ADDERALL XR) 30 MG 24 hr capsule Take 1 capsule (30 mg total) by mouth every morning. 30 capsule 0  . amphetamine-dextroamphetamine (ADDERALL XR) 30 MG 24 hr capsule Take 1 capsule (30 mg total) by mouth every morning. 30 capsule 0  . B-D ULTRAFINE III SHORT PEN 31G X 8 MM MISC     . buPROPion (WELLBUTRIN XL) 150 MG 24 hr tablet Take 1 tablet (150 mg total) by mouth every morning. 30 tablet 0  . Clindamycin Phosphate foam     . cyclobenzaprine (FLEXERIL) 10 MG tablet Take 1/2 pill to 1 pill at bedtime as needed 30 tablet 3  . ibuprofen (ADVIL) 800 MG tablet Take 1 tablet (800 mg total) by mouth every 8 (eight) hours as needed. 30 tablet 0  . Insulin Degludec (TRESIBA) 100 UNIT/ML SOLN Inject 70 Units into the skin daily.     . Lancets (ONETOUCH DELICA PLUS LANCET33G) MISC     . lisinopril (PRINIVIL,ZESTRIL) 2.5 MG tablet Take 2.5 mg by mouth daily.    Marland Kitchen LORazepam (ATIVAN) 0.5 MG tablet Take 1 tablet (0.5 mg total) by mouth 2 (two) times daily as needed for anxiety. 45 tablet 1  . metFORMIN (GLUCOPHAGE-XR) 500 MG 24 hr tablet Take 500 mg by mouth 2 (two) times daily.    . metoCLOPramide (REGLAN) 5 MG tablet Take 1 tablet (5 mg total) by mouth 4 (four) times  daily -  before meals and at bedtime for 7 days. 28 tablet 0  . norelgestromin-ethinyl estradiol Burr Medico) 150-35 MCG/24HR transdermal patch Place 1 patch onto the skin once a week. xulane patch    . omeprazole (PRILOSEC) 40 MG capsule Take 1 capsule (40 mg total) by mouth daily. 30 minutes before food 60 capsule 2  . ondansetron (ZOFRAN) 4 MG tablet Take 1 tablet (4 mg total) by mouth every 8 (eight) hours as needed for nausea or vomiting. 20 tablet 0  . ONETOUCH ULTRA test strip     . rizatriptan (MAXALT-MLT) 10 MG disintegrating tablet Take 1 tablet (10 mg total) by mouth as needed for migraine. May repeat in 2 hours if needed 9 tablet 11  . Semaglutide,0.25 or 0.5MG /DOS, (OZEMPIC, 0.25 OR 0.5 MG/DOSE,) 2 MG/1.5ML SOPN Inject into the skin once a week.    . spironolactone (ALDACTONE) 100 MG tablet Take 100 mg by mouth daily.     No current facility-administered medications for this visit.     Musculoskeletal: Strength & Muscle Tone: unable to assess due to telemed visit Gait & Station: unable to assess due to telemed visit Patient leans: unable to assess due to telemed visit   Psychiatric Specialty Exam: ROS  Last menstrual period 09/30/2019.There is no height or weight on file to calculate BMI.  General Appearance: Well Groomed  Eye Contact:  Good  Speech:  Clear and Coherent and Normal Rate  Volume:  Normal  Mood:  Slightly depressed  Affect:  Congruent  Thought Process:  Goal Directed, Linear and Descriptions of Associations: Intact  Orientation:  Full (Time, Place, and Person)  Thought Content: Logical   Suicidal Thoughts:  No  Homicidal Thoughts:  No  Memory:  Recent;   Good Remote;   Good  Judgement:  Good  Insight:  Good  Psychomotor Activity:  Normal  Concentration:  Concentration: Good and Attention Span: Good  Recall:  Good  Fund of Knowledge: Good  Language: Good  Akathisia:  No  Handed:  Right  AIMS (if indicated): not done  Assets:  Communication  Skills Desire for Improvement Financial Resources/Insurance Housing Social Support Talents/Skills Transportation  ADL's:  Intact  Cognition: WNL  Sleep:  Good    Assessment and Plan: 29 year old female with history of depression and anxiety and ADHD inattentive type now seen for follow-up.  Patient reported improvement in her depressive symptoms and fatigue after she was switched to Wellbutrin.  She requested increasing the dose of Wellbutrin and Adderall XR for optimal effect.  She feels that with increasing the doses she can do even better.   1. Attention deficit hyperactivity disorder (ADHD), predominantly inattentive type  - Increase amphetamine-dextroamphetamine (ADDERALL XR) 20 MG 24 hr capsule; Take 2 capsules in the morning  Dispense: 60 capsule; Refill: 0 - amphetamine-dextroamphetamine (ADDERALL XR) 20 MG 24 hr capsule; Take 2 capsules in the morning  Dispense: 60 capsule; Refill: 0 - amphetamine-dextroamphetamine (ADDERALL XR) 20 MG 24 hr capsule; Take 2 capsules in the morning  Dispense: 60 capsule; Refill: 0  2. Depression with anxiety  - Increase buPROPion (WELLBUTRIN XL) 300 MG 24 hr tablet; Take 1 tablet (300 mg total) by mouth every morning.  Dispense: 90 tablet; Refill: 0 - LORazepam (ATIVAN) 0.5 MG tablet; Take 1 tablet (0.5 mg total) by mouth 2 (two) times daily as needed for anxiety.  Dispense: 45 tablet; Refill: 1   F/up in 3 months.  Dawn Amos, MD 10/14/2019, 8:53 AM

## 2019-10-21 ENCOUNTER — Encounter: Payer: Self-pay | Admitting: Gastroenterology

## 2019-10-21 ENCOUNTER — Other Ambulatory Visit: Payer: Self-pay | Admitting: Gastroenterology

## 2019-10-21 DIAGNOSIS — R1013 Epigastric pain: Secondary | ICD-10-CM

## 2019-10-21 MED ORDER — METOCLOPRAMIDE HCL 5 MG PO TABS: 5.0000 mg | ORAL_TABLET | Freq: Three times a day (TID) | ORAL | 0 refills | Status: DC

## 2019-11-16 ENCOUNTER — Telehealth: Payer: Self-pay

## 2019-11-16 NOTE — Telephone Encounter (Signed)
prior Berkley Harvey was approved for amphetamine-dextroamphet er 20mg  approved from 11-12-19 to  11-11-20.  case id # [id number]

## 2019-12-25 ENCOUNTER — Ambulatory Visit: Payer: Managed Care, Other (non HMO) | Admitting: Surgery

## 2019-12-30 ENCOUNTER — Other Ambulatory Visit: Payer: Self-pay | Admitting: Internal Medicine

## 2019-12-30 DIAGNOSIS — K219 Gastro-esophageal reflux disease without esophagitis: Secondary | ICD-10-CM

## 2019-12-30 MED ORDER — OMEPRAZOLE 40 MG PO CPDR: 40.0000 mg | DELAYED_RELEASE_CAPSULE | Freq: Every day | ORAL | 3 refills | Status: DC

## 2020-01-05 ENCOUNTER — Ambulatory Visit (INDEPENDENT_AMBULATORY_CARE_PROVIDER_SITE_OTHER): Payer: Managed Care, Other (non HMO) | Admitting: Surgery

## 2020-01-05 ENCOUNTER — Telehealth: Payer: Self-pay | Admitting: Surgery

## 2020-01-05 ENCOUNTER — Other Ambulatory Visit: Payer: Self-pay

## 2020-01-05 ENCOUNTER — Encounter: Payer: Self-pay | Admitting: Surgery

## 2020-01-05 ENCOUNTER — Ambulatory Visit: Payer: Self-pay | Admitting: Surgery

## 2020-01-05 ENCOUNTER — Other Ambulatory Visit
Admission: RE | Admit: 2020-01-05 | Discharge: 2020-01-05 | Disposition: A | Discharge status: Home / Self Care | Payer: Managed Care, Other (non HMO) | Source: Ambulatory Visit | Attending: Surgery | Admitting: Surgery

## 2020-01-05 VITALS — BP 147/90 | HR 107 | Temp 97.7°F | Resp 12 | Ht 63.0 in | Wt 171.8 lb

## 2020-01-05 DIAGNOSIS — Z01812 Encounter for preprocedural laboratory examination: Secondary | ICD-10-CM | POA: Diagnosis present

## 2020-01-05 DIAGNOSIS — Z20822 Contact with and (suspected) exposure to covid-19: Secondary | ICD-10-CM | POA: Insufficient documentation

## 2020-01-05 DIAGNOSIS — L02419 Cutaneous abscess of limb, unspecified: Secondary | ICD-10-CM

## 2020-01-05 LAB — SARS CORONAVIRUS 2 (TAT 6-24 HRS): SARS Coronavirus 2: NEGATIVE

## 2020-01-05 MED ORDER — CLINDAMYCIN HCL 300 MG PO CAPS: 300.0000 mg | ORAL_CAPSULE | Freq: Three times a day (TID) | ORAL | 0 refills | Status: AC

## 2020-01-05 MED ORDER — FLUCONAZOLE 150 MG PO TABS: 150.0000 mg | ORAL_TABLET | Freq: Every day | ORAL | 0 refills | Status: DC

## 2020-01-05 NOTE — Progress Notes (Signed)
Patient ID: Dawn Ortiz, female   DOB: October 31, 1990, 29 y.o.   MRN: [id number]  Chief Complaint: Recurrent right axillary abscess  History of Present Illness Infantof Dawn Ortiz is a 29 y.o. female with history of multiple intertriginous abscesses in various points in the her body.  Her last I&D from this area was about 2 to 3 years ago.  She is currently noted this 1 developing over the last couple weeks.  Pain is progressed from a 5 to an 8 out of 10 she has associated swelling.  Denies fevers and chills.  She took the last couple doses from clindamycin from her previous treatment.  She is anticipating travel to [place] this coming weekend.  Past Medical History Past Medical History:  Diagnosis Date   Abscess of left breast    Abscess of left thigh    Abscess of right breast    Abscess of right thigh    Abscess of the breast and nipple 03/02/2019   Anxiety    Axillary abscess 57/09/2200   Biliary colic    Diabetes mellitus without complication (HCC)    GERD (gastroesophageal reflux disease)    Leukocytosis 07/19/2019   Migraines       Past Surgical History:  Procedure Laterality Date   CHOLECYSTECTOMY     ESOPHAGOGASTRODUODENOSCOPY (EGD) WITH PROPOFOL N/A 10/01/2019   Procedure: ESOPHAGOGASTRODUODENOSCOPY (EGD) WITH PROPOFOL;  Surgeon: Lin Landsman, MD;  Location: [facility];  Service: Gastroenterology;  Laterality: N/A;   I & D EXTREMITY Left 03/05/2019   Procedure: Irrigation and debridement;  Surgeon: Olean Ree, MD;  Location: [facility];  Service: General;  Laterality: Left;   INCISION AND DRAINAGE     right breast x1 and left groin x1 and left axilla   INCISION AND DRAINAGE ABSCESS Left 07/02/2016   Procedure: INCISION AND DRAINAGE ABSCESS;  Surgeon: Florene Glen, MD;  Location: [facility];  Service: General;  Laterality: Left;   INCISION AND DRAINAGE ABSCESS Left 04/18/2017   Procedure: INCISION AND DRAINAGE ABSCESS-LEFT GROIN;  Surgeon: Vickie Epley, MD;  Location: [facility];  Service: General;  Laterality: Left;   INCISION AND DRAINAGE ABSCESS Right 05/21/2018   Procedure: INCISION AND DRAINAGE ABSCESS- RIGHT THIGH;  Surgeon: Olean Ree, MD;  Location: [facility];  Service: General;  Laterality: Right;   IRRIGATION AND DEBRIDEMENT ABSCESS Right 04/09/2018   Procedure: IRRIGATION AND DEBRIDEMENT ABSCESS;  Surgeon: Olean Ree, MD;  Location: [facility];  Service: General;  Laterality: Right;   IRRIGATION AND DEBRIDEMENT ABSCESS Left 05/21/2018   Procedure: IRRIGATION AND DEBRIDEMENT BREAST ABSCESS;  Surgeon: Olean Ree, MD;  Location: [facility];  Service: General;  Laterality: Left;   PILONIDAL CYST EXCISION  12/03/2014   Procedure: CYST EXCISION PILONIDAL EXTENSIVE;  Surgeon: Molly Maduro, MD;  Location: [facility];  Service: General;;    Allergies  Allergen Reactions   Sulfa Antibiotics Hives    Current Outpatient Medications  Medication Sig Dispense Refill   amphetamine-dextroamphetamine (ADDERALL XR) 20 MG 24 hr capsule Take 2 capsules in the morning 60 capsule 0   amphetamine-dextroamphetamine (ADDERALL XR) 20 MG 24 hr capsule Take 2 capsules in the morning 60 capsule 0   amphetamine-dextroamphetamine (ADDERALL XR) 20 MG 24 hr capsule Take 2 capsules in the morning 60 capsule 0   B-D ULTRAFINE III SHORT PEN 31G X 8 MM MISC      buPROPion (WELLBUTRIN XL) 300 MG 24 hr tablet Take 1 tablet (300 mg total) by mouth every morning. [place]  tablet 0   Clindamycin Phosphate foam      cyclobenzaprine (FLEXERIL) 10 MG tablet Take 1/2 pill to 1 pill at bedtime as needed 30 tablet 3   ibuprofen (ADVIL) 800 MG tablet Take 1 tablet (800 mg total) by mouth every 8 (eight) hours as needed. 30 tablet 0   Insulin Degludec (TRESIBA) 100 UNIT/ML SOLN Inject 70 Units into the skin daily.      Lancets (ONETOUCH DELICA PLUS LANCET33G) MISC      lisinopril (PRINIVIL,ZESTRIL) 2.5 MG tablet Take 2.5 mg by mouth daily.     LORazepam  (ATIVAN) 0.5 MG tablet Take 1 tablet (0.5 mg total) by mouth 2 (two) times daily as needed for anxiety. 45 tablet 1   metFORMIN (GLUCOPHAGE-XR) 500 MG 24 hr tablet Take 500 mg by mouth 2 (two) times daily.     norelgestromin-ethinyl estradiol [employer]) 150-35 MCG/24HR transdermal patch Place 1 patch onto the skin once a week. xulane patch     omeprazole (PRILOSEC) 40 MG capsule Take 1 capsule (40 mg total) by mouth daily. 30 minutes before food 90 capsule 3   ondansetron (ZOFRAN) 4 MG tablet Take 1 tablet (4 mg total) by mouth every 8 (eight) hours as needed for nausea or vomiting. 20 tablet 0   ONETOUCH ULTRA test strip      Semaglutide,0.25 or 0.5MG /DOS, (OZEMPIC, 0.25 OR 0.5 MG/DOSE,) 2 MG/1.5ML SOPN Inject into the skin once a week.     metoCLOPramide (REGLAN) 5 MG tablet Take 1 tablet (5 mg total) by mouth 4 (four) times daily -  before meals and at bedtime for 28 days. 112 tablet 0   No current facility-administered medications for this visit.    Family History Family History  Problem Relation Age of Onset   Healthy Mother    Hypertension Father    Diabetes Father    Heart disease Father    Sarcoidosis Father    Healthy Brother    Obesity Brother    Cancer Paternal Grandfather        ?prostate    Hypertension Other       Social History Social History   Tobacco Use   Smoking status: Former Smoker    Types: Cigarettes   Smokeless tobacco: Never Used  Substance Use Topics   Alcohol use: Yes    Alcohol/week: 1.0 - 2.0 standard drinks    Types: 1 - 2 Glasses of wine per week    Comment: none last 24hrs   Drug use: No        Review of Systems  Constitutional: Negative for chills and fever.  HENT: Negative.   Eyes: Negative.   Respiratory: Negative for cough and shortness of breath.   Cardiovascular: Negative.   Gastrointestinal: Negative.   Genitourinary: Negative.   Musculoskeletal: Negative for myalgias.  Skin: Negative for itching and rash.   Neurological: Negative.   Endo/Heme/Allergies: Negative.       Physical Exam Blood pressure (!) 147/90, pulse (!) 107, temperature 97.7 F (36.5 C), resp. rate 12, height 5\' 3"  (1.6 m), weight 171 lb 12.8 oz (77.9 kg), SpO2 99 %. Last Weight  Most recent update: 01/05/2020  9:09 AM   Weight  77.9 kg (171 lb 12.8 oz)            CONSTITUTIONAL: Well developed, and nourished, appropriately responsive and aware without distress.   EYES: Sclera non-icteric.   EARS, NOSE, MOUTH AND THROAT: Mask worn.  Hearing is intact to voice.  NECK: Trachea  is midline, and there is no jugular venous distension.  LYMPH NODES:  Lymph nodes in the neck are not enlarged. RESPIRATORY:  Lungs are clear, and breath sounds are equal bilaterally. Normal respiratory effort without pathologic use of accessory muscles. CARDIOVASCULAR: Heart is regular in rate and rhythm. GI: The abdomen is soft, nontender, and nondistended.   MUSCULOSKELETAL:  Symmetrical muscle tone appreciated in all four extremities.    SKIN: Skin turgor is normal. No pathologic skin lesions appreciated.  There is a fluctuant mass in the right axilla, area of superficial fluctuance feels to be about 3 cm in anterior to posterior axis.  There is surrounding induration in the axilla.  There is no points of drainage, and no overlying skin changes. NEUROLOGIC:  Motor and sensation appear grossly normal.  Cranial nerves are grossly without defect. PSYCH:  Alert and oriented to person, place and time. Affect is appropriate for situation.  Data Reviewed I have personally reviewed what is currently available of the patient's imaging, recent labs and medical records.   Labs:  CBC Latest Ref Rng & Units 07/21/2019 07/20/2019 07/19/2019  WBC 4.0 - 10.5 K/uL 9.9 14.3(H) 13.1(H)  Hemoglobin 12.0 - 15.0 g/dL 11.8(L) 11.7(L) 12.7  Hematocrit 36.0 - 46.0 % 35.8(L) 33.4(L) 36.9  Platelets 150 - 400 K/uL 329 354 368   CMP Latest Ref Rng & Units 07/21/2019  07/20/2019 07/19/2019  Glucose 70 - 99 mg/dL 226(J) 335(K) 562(B)  BUN 6 - 20 mg/dL 7 7 9   Creatinine 0.44 - 1.00 mg/dL 6.38 9.37  Sodium 135 - 145 mmol/L 139 138 133(L)  Potassium 3.5 - 5.1 mmol/L 3.6 3.8 3.8  Chloride 98 - 111 mmol/L 106 104 103  CO2 22 - 32 mmol/L 23 24 18(L)  Calcium 8.9 - 10.3 mg/dL 9.0 9.1 9.0  Total Protein 6.5 - 8.1 g/dL 7.3 - 8.8(H)  Total Bilirubin 0.3 - 1.2 mg/dL 0.4 - 0.5  Alkaline Phos 38 - 126 U/L 135(H) - 172(H)  AST 15 - 41 U/L 17 - 25  ALT 0 - 44 U/L 27 - 42      Imaging:  Within last 24 hrs: No results found.  Assessment    Recurrent right axillary abscess. Patient Active Problem List   Diagnosis Date Noted   Dyspepsia    Abdominal pain 07/19/2019   Intractable nausea and vomiting 07/19/2019   HTN (hypertension) 07/19/2019   Attention deficit hyperactivity disorder (ADHD), 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   Depression with anxiety 05/18/2019   Migraine without status migrainosus, not intractable 04/29/2019   Type 2 diabetes mellitus with hyperglycemia, with long-term current use of insulin (HCC) 02/06/2019   Anxiety    Diabetes mellitus without complication (HCC)    GERD (gastroesophageal reflux disease)     Plan    Incision and drainage under general anesthesia in OR, tomorrow.  Sent prescription for clindamycin and Diflucan. She is well acquainted with open wound care and understands she will be taking open wound with her in her travels.  Face-to-face time spent with the patient and accompanying care providers(if present) was 30 minutes, with more than 50% of the time spent counseling, educating, and coordinating care of the patient.      04/09/2019 M.D., FACS 01/05/2020, 9:25 AM

## 2020-01-05 NOTE — Telephone Encounter (Signed)
Pt has been advised of Pre-Admission date/time, COVID Testing date and Surgery date in person.   Surgery Date: 01/06/20 Preadmission Testing Date: 01/06/20 (office, arrive 2 hours early) Covid Testing Date: 01/05/20 - patient [facility] to [facility] ([address] [facility]) between 8a-1p   Patient informed to arrive at 7:00 am per [facility] in the OR as this case was added on for the following day.

## 2020-01-05 NOTE — H&P (View-Only) (Signed)
Patient ID: Dawn Ortiz, female   DOB: 04/24/1991, 28 y.o.   MRN: [id number] ° °Chief Complaint: Recurrent right axillary abscess ° °History of Present Illness °Dawn Ortiz is a 28 y.o. female with history of multiple intertriginous abscesses in various points in the her body.  Her last I&D from this area was about 2 to 3 years ago.  She is currently noted this 1 developing over the last couple weeks.  Pain is progressed from a 5 to an 8 out of 10 she has associated swelling.  Denies fevers and chills.  She took the last couple doses from clindamycin from her previous treatment.  She is anticipating travel to [place] this coming weekend. ° °Past Medical History °Past Medical History:  °Diagnosis Date  °• Abscess of left breast   °• Abscess of left thigh   °• Abscess of right breast   °• Abscess of right thigh   °• Abscess of the breast and nipple 03/02/2019  °• Anxiety   °• Axillary abscess 07/02/2016  °• Biliary colic   °• Diabetes mellitus without complication (HCC)   °• GERD (gastroesophageal reflux disease)   °• Leukocytosis 07/19/2019  °• Migraines   °  ° ° °Past Surgical History:  °Procedure Laterality Date  °• CHOLECYSTECTOMY    °• ESOPHAGOGASTRODUODENOSCOPY (EGD) WITH PROPOFOL N/A 10/01/2019  ° Procedure: ESOPHAGOGASTRODUODENOSCOPY (EGD) WITH PROPOFOL;  Surgeon: Vanga, Rohini Reddy, MD;  Location: [facility];  Service: Gastroenterology;  Laterality: N/A;  °• I & D EXTREMITY Left 03/05/2019  ° Procedure: Irrigation and debridement;  Surgeon: Piscoya, Jose, MD;  Location: [facility];  Service: General;  Laterality: Left;  °• INCISION AND DRAINAGE    ° right breast x1 and left groin x1 and left axilla  °• INCISION AND DRAINAGE ABSCESS Left 07/02/2016  ° Procedure: INCISION AND DRAINAGE ABSCESS;  Surgeon: [name], MD;  Location: [facility];  Service: General;  Laterality: Left;  °• INCISION AND DRAINAGE ABSCESS Left 04/18/2017  ° Procedure: INCISION AND DRAINAGE ABSCESS-LEFT GROIN;  Surgeon: [name], Jason  Evan, MD;  Location: [facility];  Service: General;  Laterality: Left;  °• INCISION AND DRAINAGE ABSCESS Right 05/21/2018  ° Procedure: INCISION AND DRAINAGE ABSCESS- RIGHT THIGH;  Surgeon: Piscoya, Jose, MD;  Location: [facility];  Service: General;  Laterality: Right;  °• IRRIGATION AND DEBRIDEMENT ABSCESS Right 04/09/2018  ° Procedure: IRRIGATION AND DEBRIDEMENT ABSCESS;  Surgeon: Piscoya, Jose, MD;  Location: [facility];  Service: General;  Laterality: Right;  °• IRRIGATION AND DEBRIDEMENT ABSCESS Left 05/21/2018  ° Procedure: IRRIGATION AND DEBRIDEMENT BREAST ABSCESS;  Surgeon: Piscoya, Jose, MD;  Location: [facility];  Service: General;  Laterality: Left;  °• PILONIDAL CYST EXCISION  12/03/2014  ° Procedure: CYST EXCISION PILONIDAL EXTENSIVE;  Surgeon: [name], MD;  Location: [facility];  Service: General;;  ° ° °Allergies  °Allergen Reactions  °• Sulfa Antibiotics Hives  ° ° °Current Outpatient Medications  °Medication Sig Dispense Refill  °• amphetamine-dextroamphetamine (ADDERALL XR) 20 MG 24 hr capsule Take 2 capsules in the morning 60 capsule 0  °• amphetamine-dextroamphetamine (ADDERALL XR) 20 MG 24 hr capsule Take 2 capsules in the morning 60 capsule 0  °• amphetamine-dextroamphetamine (ADDERALL XR) 20 MG 24 hr capsule Take 2 capsules in the morning 60 capsule 0  °• B-D ULTRAFINE III SHORT PEN 31G X 8 MM MISC     °• buPROPion (WELLBUTRIN XL) 300 MG 24 hr tablet Take 1 tablet (300 mg total) by mouth every morning. 90   tablet 0  °• Clindamycin Phosphate foam     °• cyclobenzaprine (FLEXERIL) 10 MG tablet Take 1/2 pill to 1 pill at bedtime as needed 30 tablet 3  °• ibuprofen (ADVIL) 800 MG tablet Take 1 tablet (800 mg total) by mouth every 8 (eight) hours as needed. 30 tablet 0  °• Insulin Degludec (TRESIBA) 100 UNIT/ML SOLN Inject 70 Units into the skin daily.     °• Lancets (ONETOUCH DELICA PLUS LANCET33G) MISC     °• lisinopril (PRINIVIL,ZESTRIL) 2.5 MG tablet Take 2.5 mg by mouth daily.    °• LORazepam  (ATIVAN) 0.5 MG tablet Take 1 tablet (0.5 mg total) by mouth 2 (two) times daily as needed for anxiety. 45 tablet 1  °• metFORMIN (GLUCOPHAGE-XR) 500 MG 24 hr tablet Take 500 mg by mouth 2 (two) times daily.    °• norelgestromin-ethinyl estradiol (XULANE) 150-35 MCG/24HR transdermal patch Place 1 patch onto the skin once a week. xulane patch    °• omeprazole (PRILOSEC) 40 MG capsule Take 1 capsule (40 mg total) by mouth daily. 30 minutes before food 90 capsule 3  °• ondansetron (ZOFRAN) 4 MG tablet Take 1 tablet (4 mg total) by mouth every 8 (eight) hours as needed for nausea or vomiting. 20 tablet 0  °• ONETOUCH ULTRA test strip     °• Semaglutide,0.25 or 0.5MG/DOS, (OZEMPIC, 0.25 OR 0.5 MG/DOSE,) 2 MG/1.5ML SOPN Inject into the skin once a week.    °• metoCLOPramide (REGLAN) 5 MG tablet Take 1 tablet (5 mg total) by mouth 4 (four) times daily -  before meals and at bedtime for 28 days. 112 tablet 0  ° °No current facility-administered medications for this visit.  ° ° °Family History °Family History  °Problem Relation Age of Onset  °• Healthy Mother   °• Hypertension Father   °• Diabetes Father   °• Heart disease Father   °• Sarcoidosis Father   °• Healthy Brother   °• Obesity Brother   °• Cancer Paternal Grandfather   °     ?prostate   °• Hypertension Other   °  ° ° °Social History °Social History  ° °Tobacco Use  °• Smoking status: Former Smoker  °  Types: Cigarettes  °• Smokeless tobacco: Never Used  °Substance Use Topics  °• Alcohol use: Yes  °  Alcohol/week: 1.0 - 2.0 standard drinks  °  Types: 1 - 2 Glasses of wine per week  °  Comment: none last 24hrs  °• Drug use: No  °  °  ° ° °Review of Systems  °Constitutional: Negative for chills and fever.  °HENT: Negative.   °Eyes: Negative.   °Respiratory: Negative for cough and shortness of breath.   °Cardiovascular: Negative.   °Gastrointestinal: Negative.   °Genitourinary: Negative.   °Musculoskeletal: Negative for myalgias.  °Skin: Negative for itching and rash.   °Neurological: Negative.   °Endo/Heme/Allergies: Negative.   ° °  ° °Physical Exam °Blood pressure (!) 147/90, pulse (!) 107, temperature 97.7 °F (36.5 °C), resp. rate 12, height 5' 3" (1.6 m), weight 171 lb 12.8 oz (77.9 kg), SpO2 99 %. °Last Weight  Most recent update: 01/05/2020  9:09 AM  ° Weight  °77.9 kg (171 lb 12.8 oz)  °      °  ° ° °CONSTITUTIONAL: Well developed, and nourished, appropriately responsive and aware without distress.   °EYES: Sclera non-icteric.   °EARS, NOSE, MOUTH AND THROAT: Mask worn.  Hearing is intact to voice.  °NECK: Trachea   is midline, and there is no jugular venous distension.  °LYMPH NODES:  Lymph nodes in the neck are not enlarged. °RESPIRATORY:  Lungs are clear, and breath sounds are equal bilaterally. Normal respiratory effort without pathologic use of accessory muscles. °CARDIOVASCULAR: Heart is regular in rate and rhythm. °GI: The abdomen is soft, nontender, and nondistended.  ° °MUSCULOSKELETAL:  Symmetrical muscle tone appreciated in all four extremities.    °SKIN: Skin turgor is normal. No pathologic skin lesions appreciated.  There is a fluctuant mass in the right axilla, area of superficial fluctuance feels to be about 3 cm in anterior to posterior axis.  There is surrounding induration in the axilla.  There is no points of drainage, and no overlying skin changes. °NEUROLOGIC:  Motor and sensation appear grossly normal.  Cranial nerves are grossly without defect. °PSYCH:  Alert and oriented to person, place and time. Affect is appropriate for situation. ° °Data Reviewed °I have personally reviewed what is currently available of the patient's imaging, recent labs and medical records.   °Labs:  °CBC Latest Ref Rng & Units 07/21/2019 07/20/2019 07/19/2019  °WBC 4.0 - 10.5 K/uL 9.9 14.3(H) 13.1(H)  °Hemoglobin 12.0 - 15.0 g/dL 11.8(L) 11.7(L) 12.7  °Hematocrit 36.0 - 46.0 % 35.8(L) 33.4(L) 36.9  °Platelets 150 - 400 K/uL 329 354 368  ° °CMP Latest Ref Rng & Units 07/21/2019  07/20/2019 07/19/2019  °Glucose 70 - 99 mg/dL 107(H) 133(H) 285(H)  °BUN 6 - 20 mg/dL 7 7 9  °Creatinine 0.44 - 1.00 mg/dL 0.56 0.55 0.71  °Sodium 135 - 145 mmol/L 139 138 133(L)  °Potassium 3.5 - 5.1 mmol/L 3.6 3.8 3.8  °Chloride 98 - 111 mmol/L 106 104 103  °CO2 22 - 32 mmol/L 23 24 18(L)  °Calcium 8.9 - 10.3 mg/dL 9.0 9.1 9.0  °Total Protein 6.5 - 8.1 g/dL 7.3 - 8.8(H)  °Total Bilirubin 0.3 - 1.2 mg/dL 0.4 - 0.5  °Alkaline Phos 38 - 126 U/L 135(H) - 172(H)  °AST 15 - 41 U/L 17 - 25  °ALT 0 - 44 U/L 27 - 42  ° ° ° ° °Imaging: ° °Within last 24 hrs: No results found. ° °Assessment °   °Recurrent right axillary abscess. °Patient Active Problem List  ° Diagnosis Date Noted  °• Dyspepsia   °• Abdominal pain 07/19/2019  °• Intractable nausea and vomiting 07/19/2019  °• HTN (hypertension) 07/19/2019  °• Attention deficit hyperactivity disorder (ADHD), 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  °• Depression with anxiety 05/18/2019  °• Migraine without status migrainosus, not intractable 04/29/2019  °• Type 2 diabetes mellitus with hyperglycemia, with long-term current use of insulin (HCC) 02/06/2019  °• Anxiety   °• Diabetes mellitus without complication (HCC)   °• GERD (gastroesophageal reflux disease)   ° ° °Plan °   °Incision and drainage under general anesthesia in OR, tomorrow.  Sent prescription for clindamycin and Diflucan. °She is well acquainted with open wound care and understands she will be taking open wound with her in her travels. ° °Face-to-face time spent with the patient and accompanying care providers(if present) was 30 minutes, with more than 50% of the time spent counseling, educating, and coordinating care of the patient.   ° ° ° °Jaesean Litzau M.D., FACS °01/05/2020, 9:25 AM ° ° ° ° °

## 2020-01-05 NOTE — Patient Instructions (Addendum)
You are scheduled for surgery tomorrow 01/06/20 with Dr Christian Mate. Our surgery scheduler has gone over the appointment times and places with you. Please have that [place] that was given to you as reference.   Call the office if you have any questions or concerns.   Skin Abscess  A skin abscess is an infected area on or under your skin that contains a collection of pus and other material. An abscess may also be called a furuncle, carbuncle, or boil. An abscess can occur in or on almost any part of your body. Some abscesses break open (rupture) on their own. Most continue to get worse unless they are treated. The infection can spread deeper into the body and eventually into your blood, which can make you feel ill. Treatment usually involves draining the abscess. What are the causes? An abscess occurs when germs, like bacteria, pass through your skin and cause an infection. This may be caused by:  A scrape or cut on your skin.  A puncture wound through your skin, including a needle injection or insect bite.  Blocked oil or sweat glands.  Blocked and infected hair follicles.  A cyst that forms beneath your skin (sebaceous cyst) and becomes infected. What increases the risk? This condition is more likely to develop in people who:  Have a weak body defense system (immune system).  Have diabetes.  Have dry and irritated skin.  Get frequent injections or use illegal IV drugs.  Have a foreign body in a wound, such as a splinter.  Have problems with their lymph system or veins. What are the signs or symptoms? Symptoms of this condition include:  A painful, firm bump under the skin.  A bump with pus at the top. This may break through the skin and drain. Other symptoms include:  Redness surrounding the abscess site.  Warmth.  Swelling of the lymph nodes (glands) near the abscess.  Tenderness.  A sore on the skin. How is this diagnosed? This condition may be diagnosed based  on:  A physical exam.  Your medical history.  A sample of pus. This may be used to find out what is causing the infection.  Blood tests.  Imaging tests, such as an ultrasound, CT scan, or MRI. How is this treated? A small abscess that drains on its own may not need treatment. Treatment for larger abscesses may include:  Moist heat or heat pack applied to the area several times a day.  A procedure to drain the abscess (incision and drainage).  Antibiotic medicines. For a severe abscess, you may first get antibiotics through an IV and then change to antibiotics by mouth. Follow these instructions at home: Medicines   Take over-the-counter and prescription medicines only as told by your health care provider.  If you were prescribed an antibiotic medicine, take it as told by your health care provider. Do not stop taking the antibiotic even if you start to feel better. Abscess care   If you have an abscess that has not drained, apply heat to the affected area. Use the heat source that your health care provider recommends, such as a moist heat pack or a heating pad. ? Place a towel between your skin and the heat source. ? Leave the heat on for 20-30 minutes. ? Remove the heat if your skin turns bright red. This is especially important if you are unable to feel pain, heat, or cold. You may have a greater risk of getting burned.  Follow instructions  from your health care provider about how to take care of your abscess. Make sure you: ? Cover the abscess with a bandage (dressing). ? Change your dressing or gauze as told by your health care provider. ? Wash your hands with soap and water before you change the dressing or gauze. If soap and water are not available, use hand sanitizer.  Check your abscess every day for signs of a worsening infection. Check for: ? More redness, swelling, or pain. ? More fluid or blood. ? Warmth. ? More pus or a bad smell. General instructions  To  avoid spreading the infection: ? Do not share personal care items, towels, or hot tubs with others. ? Avoid making skin contact with other people.  Keep all follow-up visits as told by your health care provider. This is important. Contact a health care provider if you have:  More redness, swelling, or pain around your abscess.  More fluid or blood coming from your abscess.  Warm skin around your abscess.  More pus or a bad smell coming from your abscess.  A fever.  Muscle aches.  Chills or a general ill feeling. Get help right away if you:  Have severe pain.  See red streaks on your skin spreading away from the abscess. Summary  A skin abscess is an infected area on or under your skin that contains a collection of pus and other material.  A small abscess that drains on its own may not need treatment.  Treatment for larger abscesses may include having a procedure to drain the abscess and taking an antibiotic. This information is not intended to replace advice given to you by your health care provider. Make sure you discuss any questions you have with your health care provider. Document Revised: 11/06/2018 Document Reviewed: 08/29/2017 Elsevier Patient Education  2020 [employer].

## 2020-01-06 ENCOUNTER — Ambulatory Visit: Payer: Managed Care, Other (non HMO) | Admitting: Certified Registered"

## 2020-01-06 ENCOUNTER — Other Ambulatory Visit: Payer: Self-pay

## 2020-01-06 ENCOUNTER — Encounter: Payer: Self-pay | Admitting: Surgery

## 2020-01-06 ENCOUNTER — Encounter
Admission: RE | Disposition: A | Discharge status: Home / Self Care | Payer: Self-pay | Source: Home / Self Care | Attending: Surgery

## 2020-01-06 ENCOUNTER — Ambulatory Visit
Admission: RE | Admit: 2020-01-06 | Discharge: 2020-01-06 | Disposition: A | Discharge status: Home / Self Care | Payer: Managed Care, Other (non HMO) | Attending: Surgery | Admitting: Surgery

## 2020-01-06 ENCOUNTER — Telehealth: Payer: 59 | Admitting: Psychiatry

## 2020-01-06 ENCOUNTER — Telehealth (HOSPITAL_COMMUNITY): Payer: 59 | Admitting: Psychiatry

## 2020-01-06 DIAGNOSIS — Z794 Long term (current) use of insulin: Secondary | ICD-10-CM | POA: Insufficient documentation

## 2020-01-06 DIAGNOSIS — E119 Type 2 diabetes mellitus without complications: Secondary | ICD-10-CM | POA: Insufficient documentation

## 2020-01-06 DIAGNOSIS — Z79899 Other long term (current) drug therapy: Secondary | ICD-10-CM | POA: Diagnosis not present

## 2020-01-06 DIAGNOSIS — I10 Essential (primary) hypertension: Secondary | ICD-10-CM | POA: Insufficient documentation

## 2020-01-06 DIAGNOSIS — L02419 Cutaneous abscess of limb, unspecified: Secondary | ICD-10-CM | POA: Diagnosis not present

## 2020-01-06 DIAGNOSIS — L02411 Cutaneous abscess of right axilla: Secondary | ICD-10-CM | POA: Diagnosis present

## 2020-01-06 DIAGNOSIS — G43909 Migraine, unspecified, not intractable, without status migrainosus: Secondary | ICD-10-CM | POA: Diagnosis not present

## 2020-01-06 DIAGNOSIS — K219 Gastro-esophageal reflux disease without esophagitis: Secondary | ICD-10-CM | POA: Insufficient documentation

## 2020-01-06 DIAGNOSIS — Z793 Long term (current) use of hormonal contraceptives: Secondary | ICD-10-CM | POA: Diagnosis not present

## 2020-01-06 DIAGNOSIS — Z87891 Personal history of nicotine dependence: Secondary | ICD-10-CM | POA: Insufficient documentation

## 2020-01-06 DIAGNOSIS — F909 Attention-deficit hyperactivity disorder, unspecified type: Secondary | ICD-10-CM | POA: Diagnosis not present

## 2020-01-06 DIAGNOSIS — Z8249 Family history of ischemic heart disease and other diseases of the circulatory system: Secondary | ICD-10-CM | POA: Diagnosis not present

## 2020-01-06 DIAGNOSIS — F419 Anxiety disorder, unspecified: Secondary | ICD-10-CM | POA: Diagnosis not present

## 2020-01-06 HISTORY — PX: IRRIGATION AND DEBRIDEMENT ABSCESS: SHX5252

## 2020-01-06 LAB — POCT PREGNANCY, URINE: Preg Test, Ur: NEGATIVE

## 2020-01-06 LAB — GLUCOSE, CAPILLARY
Glucose-Capillary: 90 mg/dL (ref 70–99)
Glucose-Capillary: 91 mg/dL (ref 70–99)

## 2020-01-06 SURGERY — IRRIGATION AND DEBRIDEMENT ABSCESS
Anesthesia: General | Laterality: Right

## 2020-01-06 MED ORDER — IBUPROFEN 800 MG PO TABS
800.0000 mg | ORAL_TABLET | Freq: Three times a day (TID) | ORAL | 0 refills | Status: DC | PRN
Start: 2020-01-06 — End: 2020-01-21

## 2020-01-06 MED ORDER — PHENYLEPHRINE HCL (PRESSORS) 10 MG/ML IV SOLN
INTRAVENOUS | Status: AC
  Filled 2020-01-06: qty 1

## 2020-01-06 MED ORDER — BUPIVACAINE LIPOSOME 1.3 % IJ SUSP
INTRAMUSCULAR | Status: AC
  Filled 2020-01-06: qty 20

## 2020-01-06 MED ORDER — HYDROCODONE-ACETAMINOPHEN 5-325 MG PO TABS
ORAL_TABLET | ORAL | Status: AC
  Filled 2020-01-06: qty 1

## 2020-01-06 MED ORDER — FENTANYL CITRATE (PF) 100 MCG/2ML IJ SOLN
INTRAMUSCULAR | Status: AC
  Filled 2020-01-06: qty 2

## 2020-01-06 MED ORDER — FAMOTIDINE 20 MG PO TABS
ORAL_TABLET | ORAL | Status: AC
  Filled 2020-01-06: qty 1

## 2020-01-06 MED ORDER — PROPOFOL 10 MG/ML IV BOLUS
INTRAVENOUS | Status: AC
  Filled 2020-01-06: qty 20

## 2020-01-06 MED ORDER — ONDANSETRON HCL 4 MG/2ML IJ SOLN
INTRAMUSCULAR | Status: AC
  Filled 2020-01-06: qty 2

## 2020-01-06 MED ORDER — HYDROCODONE-ACETAMINOPHEN 5-325 MG PO TABS
1.0000 | ORAL_TABLET | Freq: Four times a day (QID) | ORAL | 0 refills | Status: DC | PRN
Start: 2020-01-06 — End: 2020-01-21

## 2020-01-06 MED ORDER — MIDAZOLAM HCL 2 MG/2ML IJ SOLN
INTRAMUSCULAR | Status: DC | PRN
  Administered 2020-01-06: 2 mg via INTRAVENOUS

## 2020-01-06 MED ORDER — FENTANYL CITRATE (PF) 100 MCG/2ML IJ SOLN
INTRAMUSCULAR | Status: DC | PRN
  Administered 2020-01-06: 100 ug via INTRAVENOUS
  Administered 2020-01-06 (×2): 50 ug via INTRAVENOUS

## 2020-01-06 MED ORDER — SEVOFLURANE IN SOLN
RESPIRATORY_TRACT | Status: AC
  Filled 2020-01-06: qty 250

## 2020-01-06 MED ORDER — PROPOFOL 500 MG/50ML IV EMUL
INTRAVENOUS | Status: AC
  Filled 2020-01-06: qty 50

## 2020-01-06 MED ORDER — FENTANYL CITRATE (PF) 100 MCG/2ML IJ SOLN
INTRAMUSCULAR | Status: AC
  Administered 2020-01-06: 25 ug via INTRAVENOUS
  Filled 2020-01-06: qty 2

## 2020-01-06 MED ORDER — FENTANYL CITRATE (PF) 100 MCG/2ML IJ SOLN
25.0000 ug | INTRAMUSCULAR | Status: DC | PRN
  Administered 2020-01-06 (×3): 25 ug via INTRAVENOUS

## 2020-01-06 MED ORDER — BUPIVACAINE LIPOSOME 1.3 % IJ SUSP
INTRAMUSCULAR | Status: DC | PRN
  Administered 2020-01-06: 20 mL

## 2020-01-06 MED ORDER — BUPIVACAINE LIPOSOME 1.3 % IJ SUSP: 20.0000 mL | Freq: Once | INTRAMUSCULAR | Status: DC

## 2020-01-06 MED ORDER — ORAL CARE MOUTH RINSE: 15.0000 mL | Freq: Once | OROMUCOSAL | Status: AC

## 2020-01-06 MED ORDER — ACETAMINOPHEN 500 MG PO TABS
ORAL_TABLET | ORAL | Status: AC
  Filled 2020-01-06: qty 2

## 2020-01-06 MED ORDER — OXYCODONE HCL 5 MG PO TABS: 5.0000 mg | ORAL_TABLET | Freq: Once | ORAL | Status: DC | PRN

## 2020-01-06 MED ORDER — CEFAZOLIN SODIUM-DEXTROSE 2-4 GM/100ML-% IV SOLN
2.0000 g | INTRAVENOUS | Status: AC
  Administered 2020-01-06: 2 g via INTRAVENOUS

## 2020-01-06 MED ORDER — ONDANSETRON HCL 4 MG/2ML IJ SOLN
INTRAMUSCULAR | Status: DC | PRN
  Administered 2020-01-06: 4 mg via INTRAVENOUS

## 2020-01-06 MED ORDER — HYDROCODONE-ACETAMINOPHEN 5-325 MG PO TABS
1.0000 | ORAL_TABLET | Freq: Four times a day (QID) | ORAL | Status: DC | PRN
  Administered 2020-01-06: 1 via ORAL

## 2020-01-06 MED ORDER — DEXAMETHASONE SODIUM PHOSPHATE 10 MG/ML IJ SOLN
INTRAMUSCULAR | Status: DC | PRN
  Administered 2020-01-06: 5 mg via INTRAVENOUS

## 2020-01-06 MED ORDER — CHLORHEXIDINE GLUCONATE CLOTH 2 % EX PADS
6.0000 | MEDICATED_PAD | Freq: Once | CUTANEOUS | Status: AC
  Administered 2020-01-06: 6 via TOPICAL

## 2020-01-06 MED ORDER — CEFAZOLIN SODIUM-DEXTROSE 2-4 GM/100ML-% IV SOLN
INTRAVENOUS | Status: AC
  Filled 2020-01-06: qty 100

## 2020-01-06 MED ORDER — LIDOCAINE HCL (CARDIAC) PF 100 MG/5ML IV SOSY
PREFILLED_SYRINGE | INTRAVENOUS | Status: DC | PRN
  Administered 2020-01-06: 50 mg via INTRAVENOUS

## 2020-01-06 MED ORDER — MIDAZOLAM HCL 2 MG/2ML IJ SOLN
INTRAMUSCULAR | Status: AC
  Filled 2020-01-06: qty 2

## 2020-01-06 MED ORDER — OXYCODONE HCL 5 MG/5ML PO SOLN: 5.0000 mg | Freq: Once | ORAL | Status: DC | PRN

## 2020-01-06 MED ORDER — PHENYLEPHRINE HCL (PRESSORS) 10 MG/ML IV SOLN
INTRAVENOUS | Status: DC | PRN
  Administered 2020-01-06 (×3): 100 ug via INTRAVENOUS

## 2020-01-06 MED ORDER — PROPOFOL 10 MG/ML IV BOLUS
INTRAVENOUS | Status: DC | PRN
  Administered 2020-01-06: 200 mg via INTRAVENOUS

## 2020-01-06 MED ORDER — CHLORHEXIDINE GLUCONATE 0.12 % MT SOLN
OROMUCOSAL | Status: AC
  Administered 2020-01-06: 15 mL via OROMUCOSAL
  Filled 2020-01-06: qty 15

## 2020-01-06 MED ORDER — ACETAMINOPHEN 500 MG PO TABS
1000.0000 mg | ORAL_TABLET | ORAL | Status: AC
  Administered 2020-01-06: 1000 mg via ORAL

## 2020-01-06 MED ORDER — LIDOCAINE HCL (PF) 2 % IJ SOLN
INTRAMUSCULAR | Status: AC
  Filled 2020-01-06: qty 5

## 2020-01-06 MED ORDER — CHLORHEXIDINE GLUCONATE 0.12 % MT SOLN: 15.0000 mL | Freq: Once | OROMUCOSAL | Status: AC

## 2020-01-06 MED ORDER — SODIUM CHLORIDE 0.9 % IV SOLN: INTRAVENOUS | Status: DC

## 2020-01-06 MED ORDER — DEXAMETHASONE SODIUM PHOSPHATE 10 MG/ML IJ SOLN
INTRAMUSCULAR | Status: AC
  Filled 2020-01-06: qty 1

## 2020-01-06 SURGICAL SUPPLY — 32 items
CANISTER SUCT 1200ML W/VALVE (MISCELLANEOUS) ×2 IMPLANT
CHLORAPREP W/TINT 26 (MISCELLANEOUS) ×2 IMPLANT
COVER WAND RF STERILE (DRAPES) IMPLANT
DRAPE CHEST BREAST 77X106 FENE (MISCELLANEOUS) ×2 IMPLANT
DRAPE LAPAROTOMY TRNSV 106X77 (MISCELLANEOUS) IMPLANT
DRSG TEGADERM 4X4.75 (GAUZE/BANDAGES/DRESSINGS) IMPLANT
DRSG TELFA 4X3 1S NADH ST (GAUZE/BANDAGES/DRESSINGS) IMPLANT
ELECT REM PT RETURN 9FT ADLT (ELECTROSURGICAL) ×2
ELECTRODE REM PT RTRN 9FT ADLT (ELECTROSURGICAL) ×1 IMPLANT
GAUZE PACKING IODOFORM 1X5 (PACKING) ×2 IMPLANT
GLOVE BIO SURGEON STRL SZ7.5 (GLOVE) ×6 IMPLANT
GLOVE INDICATOR 8.0 STRL GRN (GLOVE) ×6 IMPLANT
GOWN STRL REUS W/ TWL LRG LVL3 (GOWN DISPOSABLE) ×3 IMPLANT
GOWN STRL REUS W/TWL LRG LVL3 (GOWN DISPOSABLE) ×3
KIT TURNOVER KIT A (KITS) ×2 IMPLANT
LABEL OR SOLS (LABEL) ×2 IMPLANT
NEEDLE HYPO 25X1 1.5 SAFETY (NEEDLE) ×2 IMPLANT
NS IRRIG 500ML POUR BTL (IV SOLUTION) ×2 IMPLANT
PACK BASIN MINOR (MISCELLANEOUS) ×2 IMPLANT
PAD ABD DERMACEA PRESS 5X9 (GAUZE/BANDAGES/DRESSINGS) ×2 IMPLANT
SHEARS HARMONIC 9CM CVD (BLADE) IMPLANT
STRIP CLOSURE SKIN 1/2X4 (GAUZE/BANDAGES/DRESSINGS) IMPLANT
SUT VIC AB 2-0 CT1 27 (SUTURE)
SUT VIC AB 2-0 CT1 TAPERPNT 27 (SUTURE) IMPLANT
SUT VIC AB 3-0 54X BRD REEL (SUTURE) IMPLANT
SUT VIC AB 3-0 BRD 54 (SUTURE)
SUT VIC AB 4-0 PS2 18 (SUTURE) IMPLANT
SWAB CULTURE AMIES ANAERIB BLU (MISCELLANEOUS) ×2 IMPLANT
SWABSTK COMLB BENZOIN TINCTURE (MISCELLANEOUS) IMPLANT
SYR BULB IRRIG 60ML STRL (SYRINGE) ×2 IMPLANT
SYR CONTROL 10ML LL (SYRINGE) ×2 IMPLANT
TAPE CLOTH SURG 4X10 WHT LF (GAUZE/BANDAGES/DRESSINGS) ×2 IMPLANT

## 2020-01-06 NOTE — Transfer of Care (Signed)
Immediate Anesthesia Transfer of Care Note  Patient: Dawn Ortiz  Procedure(s) Performed: MINOR INCISION AND DRAINAGE OF ABSCESS, right axillary (Right )  Patient Location: PACU  Anesthesia Type:General  Level of Consciousness: drowsy  Airway & Oxygen Therapy: Patient Spontanous Breathing and Patient connected to nasal cannula oxygen  Post-op Assessment: Report given to RN  Post vital signs: stable  Last Vitals:  Vitals Value Taken Time  BP    Temp    Pulse 110 01/06/20 1004  Resp    SpO2 95 % 01/06/20 1004  Vitals shown include unvalidated device data.  Last Pain:  Vitals:   01/06/20 0713  TempSrc: Tympanic  PainSc: 7          Complications: No apparent anesthesia complications

## 2020-01-06 NOTE — Interval H&P Note (Signed)
History and Physical Interval Note:  01/06/2020 9:01 AM  Dawn Ortiz  has presented today for surgery, with the diagnosis of Right axillary abscess.  The various methods of treatment have been discussed with the patient and family. After consideration of risks, benefits and other options for treatment, the patient has consented to  Procedure(s): MINOR INCISION AND DRAINAGE OF ABSCESS, right axillary (Right) as a surgical intervention.  The patient's history has been reviewed, patient examined, no change in status, stable for surgery.  I have reviewed the patient's chart and labs.  Questions were answered to the patient's satisfaction.    The right side is marked.    Campbell Lerner, M.D., [facility]  01/06/2020 ; 9:02 AM

## 2020-01-06 NOTE — Anesthesia Postprocedure Evaluation (Signed)
Anesthesia Post Note  Patient: Dawn Ortiz  Procedure(s) Performed: MINOR INCISION AND DRAINAGE OF ABSCESS, right axillary (Right )  Patient location during evaluation: PACU Anesthesia Type: General Level of consciousness: awake and alert Pain management: pain level controlled Vital Signs Assessment: post-procedure vital signs reviewed and stable Respiratory status: spontaneous breathing, nonlabored ventilation, respiratory function stable and patient connected to nasal cannula oxygen Cardiovascular status: blood pressure returned to baseline and stable Postop Assessment: no apparent nausea or vomiting Anesthetic complications: no     Last Vitals:  Vitals:   01/06/20 1105 01/06/20 1125  BP: 116/80 139/86  Pulse: (!) 103 100  Resp: 17 16  Temp: 36.6 C (!) 36.1 C  SpO2: 95% 95%    Last Pain:  Vitals:   01/06/20 1125  TempSrc: Temporal  PainSc: 3                  Cleda Mccreedy Emani Morad

## 2020-01-06 NOTE — Op Note (Signed)
Incision and drainage, with excisional debridement of recurrent right axillary abscess.  Pre-operative Diagnosis: Recurrent right axillary abscess.  Post-operative Diagnosis: same.    Surgeon: Campbell Lerner, M.D., FACS  Assistant: Volanda Napoleon, PA-S.   Anesthesia: General  Findings: Irregular cavity, well-established with resulting unroofing creating a wound sized 4.7 cm from anterior to posterior, 2 cm wide with an estimated depth of 2.5 cm.  Estimated Blood Loss: Less than 25 mL, significant volume of purulent drainage.         Specimens: Unroofed abscess cavity skin is of tissue sent.  Swabs for culture and sensitivity sent.          Complications: none              Procedure Details  The patient was seen again in the Holding Room. The benefits, complications, treatment options, and expected outcomes were discussed with the patient. The risks of bleeding, infection, recurrence of symptoms, failure to resolve symptoms, unanticipated injury, prosthetic placement, prosthetic infection, any of which could require further surgery were reviewed with the patient. The likelihood of improving the patient's symptoms with return to their baseline status is reasonable.  The patient and/or family concurred with the proposed plan, giving informed consent.  The patient was taken to Operating Room, identified and the procedure verified.    Prior to the induction of general anesthesia, antibiotic prophylaxis was administered. VTE prophylaxis was in place.  General anesthesia was then administered and tolerated well. After the induction, the patient was positioned in the right arm raised and bent at the elbow position and the right axilla and breast was prepped with Chloraprep and draped in the sterile fashion.  A Time Out was held and the above information confirmed. An incision was made with a 15 blade over the fluctuant/epidermal thinned area, and immediate egress of pus was obtained, specimen  sent for culture and sensitivity.  The wound was then explored with a hemostat and identified a significant roof that involved multiple comedones.  I felt it would be prudent and helpful to eliminate this roof to allow for better wound care and hopefully diminish the risk of recurrence.  I excised the overlying skin and soft tissues to better access the remaining irregular cavity.  The cavity had significant amounts of proud flesh within it and these were bluntly debrided with dry gauze, and sharply debrided to excision with bone curette.  Once I felt I had the cavity well debrided, I confirmed adequate hemostasis and irrigated with saline.  I then infiltrated the surrounding soft tissues and skin with Exparel, and then packed with 1 inch wide iodoform gauze.  The resulting wound measurements are noted above.  She tolerated the procedure well. We applied an ABD pad and secured it with Medipore tape.      Campbell Lerner M.D., [facility] 01/06/2020 10:01 AM

## 2020-01-06 NOTE — Anesthesia Procedure Notes (Signed)
Procedure Name: LMA Insertion Date/Time: 01/06/2020 9:08 AM Performed by: Jaye Beagle, CRNA Pre-anesthesia Checklist: Patient identified, Emergency Drugs available, Suction available, Patient being monitored and Timeout performed Patient Re-evaluated:Patient Re-evaluated prior to induction Oxygen Delivery Method: Circle system utilized Preoxygenation: Pre-oxygenation with 100% oxygen Induction Type: IV induction Ventilation: Mask ventilation without difficulty LMA: LMA inserted LMA Size: 3.5 Number of attempts: 1 Tube secured with: Tape Dental Injury: Teeth and Oropharynx as per pre-operative assessment

## 2020-01-06 NOTE — Anesthesia Preprocedure Evaluation (Signed)
Anesthesia Evaluation  Patient identified by MRN, date of birth, ID band Patient awake    Reviewed: Allergy & Precautions, H&P , NPO status , Patient's Chart, lab work & pertinent test results  History of Anesthesia Complications Negative for: history of anesthetic complications  Airway Mallampati: II  TM Distance: >3 FB Neck ROM: full    Dental  (+) Chipped   Pulmonary neg sleep apnea, neg COPD, Not current smoker, former smoker,    Pulmonary exam normal        Cardiovascular Exercise Tolerance: Good hypertension, Pt. on medications (-) Past MI and (-) CHF Normal cardiovascular exam(-) dysrhythmias (-) Valvular Problems/Murmurs     Neuro/Psych  Headaches, neg Seizures PSYCHIATRIC DISORDERS Anxiety Depression negative psych ROS   GI/Hepatic Neg liver ROS, GERD  Medicated and Controlled,  Endo/Other  diabetes, Type 2, Oral Hypoglycemic Agents  Renal/GU negative Renal ROS     Musculoskeletal   Abdominal   Peds  Hematology negative hematology ROS (+)   Anesthesia Other Findings Past Medical History: No date: Abscess of left breast No date: Abscess of left thigh No date: Abscess of right breast No date: Abscess of right thigh 03/02/2019: Abscess of the breast and nipple No date: Anxiety 07/02/2016: Axillary abscess No date: Biliary colic No date: Diabetes mellitus without complication (HCC) No date: GERD (gastroesophageal reflux disease) 07/19/2019: Leukocytosis No date: Migraines  BMI    Body Mass Index: 30.42 kg/m    Past Surgical History: No date: CHOLECYSTECTOMY 10/01/2019: ESOPHAGOGASTRODUODENOSCOPY (EGD) WITH PROPOFOL; N/A     Comment:  Procedure: ESOPHAGOGASTRODUODENOSCOPY (EGD) WITH               PROPOFOL;  Surgeon: Toney Reil, MD;  Location:               [facility];  Service: Gastroenterology;  Laterality:               N/A; 03/05/2019: I & D EXTREMITY; Left     Comment:  Procedure:  Irrigation and debridement;  Surgeon:               Henrene Dodge, MD;  Location: [facility];  Service:               General;  Laterality: Left; No date: INCISION AND DRAINAGE     Comment:  right breast x1 and left groin x1 and left axilla 07/02/2016: INCISION AND DRAINAGE ABSCESS; Left     Comment:  Procedure: INCISION AND DRAINAGE ABSCESS;  Surgeon:               Lattie Haw, MD;  Location: [facility];  Service:               General;  Laterality: Left; 04/18/2017: INCISION AND DRAINAGE ABSCESS; Left     Comment:  Procedure: INCISION AND DRAINAGE ABSCESS-LEFT GROIN;                Surgeon: Ancil Linsey, MD;  Location: [facility];                Service: General;  Laterality: Left; 05/21/2018: INCISION AND DRAINAGE ABSCESS; Right     Comment:  Procedure: INCISION AND DRAINAGE ABSCESS- RIGHT THIGH;                Surgeon: Henrene Dodge, MD;  Location: [facility];                Service: General;  Laterality: Right; 04/09/2018: IRRIGATION AND DEBRIDEMENT  ABSCESS; Right     Comment:  Procedure: IRRIGATION AND DEBRIDEMENT ABSCESS;  Surgeon:              Olean Ree, MD;  Location: [facility];  Service:               General;  Laterality: Right; 05/21/2018: IRRIGATION AND DEBRIDEMENT ABSCESS; Left     Comment:  Procedure: IRRIGATION AND DEBRIDEMENT BREAST ABSCESS;                Surgeon: Olean Ree, MD;  Location: [facility];                Service: General;  Laterality: Left; 12/03/2014: PILONIDAL CYST EXCISION     Comment:  Procedure: CYST EXCISION PILONIDAL EXTENSIVE;  Surgeon:               Molly Maduro, MD;  Location: [facility];  Service:               General;;   Reproductive/Obstetrics negative OB ROS                             Anesthesia Physical  Anesthesia Plan  ASA: III  Anesthesia Plan: General   Post-op Pain Management:    Induction: Intravenous  PONV Risk Score and Plan: 3 and Propofol infusion, TIVA and Treatment may vary due to age  or medical condition  Airway Management Planned: LMA  Additional Equipment:   Intra-op Plan:   Post-operative Plan: Extubation in OR  Informed Consent: I have reviewed the patients History and Physical, chart, labs and discussed the procedure including the risks, benefits and alternatives for the proposed anesthesia with the patient or authorized representative who has indicated his/her understanding and acceptance.     Dental Advisory Given  Plan Discussed with: Anesthesiologist, CRNA and Surgeon  Anesthesia Plan Comments: (Patient consented for risks of anesthesia including but not limited to:  - adverse reactions to medications - damage to eyes, teeth, lips or other oral mucosa - nerve damage due to positioning  - sore throat or hoarseness - Damage to heart, brain, nerves, lungs, other parts of body or loss of life  Patient voiced understanding.)        Anesthesia Quick Evaluation

## 2020-01-06 NOTE — Discharge Instructions (Signed)

## 2020-01-06 NOTE — Progress Notes (Signed)
   01/06/20 0735  Clinical Encounter Type  Visited With Patient  Visit Type Initial  Referral From Chaplain  Consult/Referral To Chaplain  Chaplain briefly spoke with patient. Chaplain told patient that she overheard that she was taking a trip soon. Patient said yes this weekend. Chaplain and patient talked about the excitement of being able to travel again. Chaplain mentioned preparing for a trip in October. The two laughed about traveling and when the nurse entered the room, chaplain wished patient well and left.

## 2020-01-07 ENCOUNTER — Encounter: Payer: Self-pay | Admitting: Surgery

## 2020-01-08 LAB — SURGICAL PATHOLOGY

## 2020-01-11 ENCOUNTER — Other Ambulatory Visit: Payer: Self-pay | Admitting: Surgery

## 2020-01-11 LAB — AEROBIC/ANAEROBIC CULTURE W GRAM STAIN (SURGICAL/DEEP WOUND)

## 2020-01-11 MED ORDER — DOXYCYCLINE HYCLATE 100 MG PO TBEC: 100.0000 mg | DELAYED_RELEASE_TABLET | Freq: Two times a day (BID) | ORAL | 1 refills | Status: DC

## 2020-01-20 ENCOUNTER — Telehealth (INDEPENDENT_AMBULATORY_CARE_PROVIDER_SITE_OTHER): Payer: 59 | Admitting: Psychiatry

## 2020-01-20 ENCOUNTER — Encounter (HOSPITAL_COMMUNITY): Payer: Self-pay | Admitting: Psychiatry

## 2020-01-20 ENCOUNTER — Other Ambulatory Visit: Payer: Self-pay

## 2020-01-20 DIAGNOSIS — F418 Other specified anxiety disorders: Secondary | ICD-10-CM

## 2020-01-20 DIAGNOSIS — F900 Attention-deficit hyperactivity disorder, predominantly inattentive type: Secondary | ICD-10-CM | POA: Diagnosis not present

## 2020-01-20 MED ORDER — AMPHETAMINE-DEXTROAMPHET ER 20 MG PO CP24: ORAL_CAPSULE | ORAL | 0 refills | Status: DC

## 2020-01-20 MED ORDER — LORAZEPAM 0.5 MG PO TABS: 0.5000 mg | ORAL_TABLET | Freq: Two times a day (BID) | ORAL | 1 refills | Status: DC | PRN

## 2020-01-20 MED ORDER — BUPROPION HCL ER (XL) 300 MG PO TB24: 300.0000 mg | ORAL_TABLET | ORAL | 0 refills | Status: DC

## 2020-01-20 NOTE — Progress Notes (Signed)
BH MD OP Progress Note  Virtual Visit via Video Note  I connected with Dawn Ortiz on 01/20/20 at  4:00 PM EDT by a video enabled telemedicine application and verified that I am speaking with the correct person using two identifiers.  Location: Patient: Home Provider: Clinic   I discussed the limitations of evaluation and management by telemedicine and the availability of in person appointments. The patient expressed understanding and agreed to proceed.  I provided 15 minutes of non-face-to-face time during this encounter.   01/20/2020 3:56 PM Dawn Ortiz  MRN:  [id number]  Chief Complaint:  " I am doing fine."   HPI: Patient reported that she was doing well however she had to get off of all the medications due to her ongoing GI issues.  She is now gradually building up the doses to the actual doses so that she can get back on track.  She informed that her GI specialist is diagnosed with gastroparesis and she is gradually improving with help of the medication she has been prescribed.  She informed that she uses lorazepam only as needed maybe a couple of times per week.  She denies any significant issues or concerns at this time.  Visit Diagnosis:    ICD-10-CM   1. Depression with anxiety  F41.8   2. Attention deficit hyperactivity disorder (ADHD), predominantly inattentive type  F90.0     Past Psychiatric History: Depression, anxiety  Past Medical History:  Past Medical History:  Diagnosis Date  . Abscess of left breast   . Abscess of left thigh   . Abscess of right breast   . Abscess of right thigh   . Abscess of the breast and nipple 03/02/2019  . Anxiety   . Axillary abscess 07/02/2016  . Biliary colic   . Diabetes mellitus without complication (HCC)   . GERD (gastroesophageal reflux disease)   . Leukocytosis 07/19/2019  . Migraines     Past Surgical History:  Procedure Laterality Date  . CHOLECYSTECTOMY    . ESOPHAGOGASTRODUODENOSCOPY (EGD) WITH PROPOFOL N/A  10/01/2019   Procedure: ESOPHAGOGASTRODUODENOSCOPY (EGD) WITH PROPOFOL;  Surgeon: Toney Reil, MD;  Location: [facility] ENDOSCOPY;  Service: Gastroenterology;  Laterality: N/A;  . I & D EXTREMITY Left 03/05/2019   Procedure: Irrigation and debridement;  Surgeon: Henrene Dodge, MD;  Location: [facility] ORS;  Service: General;  Laterality: Left;  . INCISION AND DRAINAGE     right breast x1 and left groin x1 and left axilla  . INCISION AND DRAINAGE ABSCESS Left 07/02/2016   Procedure: INCISION AND DRAINAGE ABSCESS;  Surgeon: Lattie Haw, MD;  Location: [facility] ORS;  Service: General;  Laterality: Left;  . INCISION AND DRAINAGE ABSCESS Left 04/18/2017   Procedure: INCISION AND DRAINAGE ABSCESS-LEFT GROIN;  Surgeon: Ancil Linsey, MD;  Location: [facility] ORS;  Service: General;  Laterality: Left;  . INCISION AND DRAINAGE ABSCESS Right 05/21/2018   Procedure: INCISION AND DRAINAGE ABSCESS- RIGHT THIGH;  Surgeon: Henrene Dodge, MD;  Location: [facility] ORS;  Service: General;  Laterality: Right;  . IRRIGATION AND DEBRIDEMENT ABSCESS Right 04/09/2018   Procedure: IRRIGATION AND DEBRIDEMENT ABSCESS;  Surgeon: Henrene Dodge, MD;  Location: [facility] ORS;  Service: General;  Laterality: Right;  . IRRIGATION AND DEBRIDEMENT ABSCESS Left 05/21/2018   Procedure: IRRIGATION AND DEBRIDEMENT BREAST ABSCESS;  Surgeon: Henrene Dodge, MD;  Location: [facility] ORS;  Service: General;  Laterality: Left;  . IRRIGATION AND DEBRIDEMENT ABSCESS Right 01/06/2020   Procedure: MINOR INCISION AND DRAINAGE OF ABSCESS, right  axillary;  Surgeon: Ronny Bacon, MD;  Location: [facility] ORS;  Service: General;  Laterality: Right;  . PILONIDAL CYST EXCISION  12/03/2014   Procedure: CYST EXCISION PILONIDAL EXTENSIVE;  Surgeon: Molly Maduro, MD;  Location: [facility] ORS;  Service: General;;     Family History:  Family History  Problem Relation Age of Onset  . Healthy Mother   . Hypertension Father   . Diabetes Father   . Heart disease Father   .  Sarcoidosis Father   . Healthy Brother   . Obesity Brother   . Cancer Paternal Grandfather        ?prostate   . Hypertension Other     Social History:  Social History   Socioeconomic History  . Marital status: Single    Spouse name: Not on file  . Number of children: Not on file  . Years of education: Not on file  . Highest education level: Not on file  Occupational History  . Not on file  Tobacco Use  . Smoking status: Former Smoker    Types: Cigarettes  . Smokeless tobacco: Never Used  Vaping Use  . Vaping Use: Never used  Substance and Sexual Activity  . Alcohol use: Yes    Alcohol/week: 1.0 - 2.0 standard drink    Types: 1 - 2 Glasses of wine per week    Comment: none last 24hrs  . Drug use: No  . Sexual activity: Yes    Birth control/protection: Implant  Other Topics Concern  . Not on file  Social History Narrative   DPR mom Keayra Graham [phone]    Dad Linzi Ohlinger [phone]    No kids    Works for [employer] Strain:   . Difficulty of Paying Living Expenses:   Food Insecurity:   . Worried About Charity fundraiser in the Last Year:   . Arboriculturist in the Last Year:   Transportation Needs:   . Film/video editor (Medical):   Marland Kitchen Lack of Transportation (Non-Medical):   Physical Activity:   . Days of Exercise per Week:   . Minutes of Exercise per Session:   Stress:   . Feeling of Stress :   Social Connections:   . Frequency of Communication with Friends and Family:   . Frequency of Social Gatherings with Friends and Family:   . Attends Religious Services:   . Active Member of Clubs or Organizations:   . Attends Archivist Meetings:   Marland Kitchen Marital Status:     Allergies:  Allergies  Allergen Reactions  . Sulfa Antibiotics Hives    Metabolic Disorder Labs: Lab Results  Component Value Date   HGBA1C 8.9 (H) 07/19/2019   MPG 208.73 07/19/2019   No results found for:  PROLACTIN Lab Results  Component Value Date   CHOL 127 02/26/2019   TRIG 96 02/26/2019   HDL 37 (L) 02/26/2019   CHOLHDL 3.4 02/26/2019   LDLCALC 71 02/26/2019   LDLCALC 94 10/21/2017   Lab Results  Component Value Date   TSH 0.773 02/26/2019   TSH 0.425 (L) 10/12/2016    Therapeutic Level Labs: No results found for: LITHIUM No results found for: VALPROATE No components found for:  CBMZ  Current Medications: Current Outpatient Medications  Medication Sig Dispense Refill  . amphetamine-dextroamphetamine (ADDERALL XR) 20 MG 24 hr capsule Take 2 capsules in the morning 60 capsule 0  . amphetamine-dextroamphetamine (ADDERALL  XR) 20 MG 24 hr capsule Take 2 capsules in the morning 60 capsule 0  . amphetamine-dextroamphetamine (ADDERALL XR) 20 MG 24 hr capsule Take 2 capsules in the morning 60 capsule 0  . B-D ULTRAFINE III SHORT PEN 31G X 8 MM MISC     . buPROPion (WELLBUTRIN XL) 300 MG 24 hr tablet Take 1 tablet (300 mg total) by mouth every morning. 90 tablet 0  . Clindamycin Phosphate foam     . cyclobenzaprine (FLEXERIL) 10 MG tablet Take 1/2 pill to 1 pill at bedtime as needed 30 tablet 3  . doxycycline (DORYX) 100 MG EC tablet Take 1 tablet (100 mg total) by mouth 2 (two) times daily. 60 tablet 1  . fluconazole (DIFLUCAN) 150 MG tablet Take 1 tablet (150 mg total) by mouth daily. 2 tablet 0  . HYDROcodone-acetaminophen (NORCO/VICODIN) 5-325 MG tablet Take 1 tablet by mouth every 6 (six) hours as needed for moderate pain. 15 tablet 0  . ibuprofen (ADVIL) 800 MG tablet Take 1 tablet (800 mg total) by mouth every 8 (eight) hours as needed. 30 tablet 0  . ibuprofen (ADVIL) 800 MG tablet Take 1 tablet (800 mg total) by mouth every 8 (eight) hours as needed. 30 tablet 0  . Insulin Degludec (TRESIBA) 100 UNIT/ML SOLN Inject 70 Units into the skin daily.     . Lancets (ONETOUCH DELICA PLUS LANCET33G) MISC     . lisinopril (PRINIVIL,ZESTRIL) 2.5 MG tablet Take 2.5 mg by mouth daily.     Marland Kitchen LORazepam (ATIVAN) 0.5 MG tablet Take 1 tablet (0.5 mg total) by mouth 2 (two) times daily as needed for anxiety. 45 tablet 1  . metFORMIN (GLUCOPHAGE-XR) 500 MG 24 hr tablet Take 500 mg by mouth 2 (two) times daily.    . metoCLOPramide (REGLAN) 5 MG tablet Take 1 tablet (5 mg total) by mouth 4 (four) times daily -  before meals and at bedtime for 28 days. 112 tablet 0  . norelgestromin-ethinyl estradiol Burr Medico) 150-35 MCG/24HR transdermal patch Place 1 patch onto the skin once a week. xulane patch    . omeprazole (PRILOSEC) 40 MG capsule Take 1 capsule (40 mg total) by mouth daily. 30 minutes before food 90 capsule 3  . ondansetron (ZOFRAN) 4 MG tablet Take 1 tablet (4 mg total) by mouth every 8 (eight) hours as needed for nausea or vomiting. 20 tablet 0  . ONETOUCH ULTRA test strip     . Semaglutide,0.25 or 0.5MG /DOS, (OZEMPIC, 0.25 OR 0.5 MG/DOSE,) 2 MG/1.5ML SOPN Inject into the skin once a week.     No current facility-administered medications for this visit.     Musculoskeletal: Strength & Muscle Tone: unable to assess due to telemed visit Gait & Station: unable to assess due to telemed visit Patient leans: unable to assess due to telemed visit   Psychiatric Specialty Exam: ROS  There were no vitals taken for this visit.There is no height or weight on file to calculate BMI.  General Appearance: Well Groomed  Eye Contact:  Good  Speech:  Clear and Coherent and Normal Rate  Volume:  Normal  Mood:  Slightly depressed  Affect:  Congruent  Thought Process:  Goal Directed, Linear and Descriptions of Associations: Intact  Orientation:  Full (Time, Place, and Person)  Thought Content: Logical   Suicidal Thoughts:  No  Homicidal Thoughts:  No  Memory:  Recent;   Good Remote;   Good  Judgement:  Good  Insight:  Good  Psychomotor Activity:  Normal  Concentration:  Concentration: Good and Attention Span: Good  Recall:  Good  Fund of Knowledge: Good  Language: Good  Akathisia:   No  Handed:  Right  AIMS (if indicated): not done  Assets:  Communication Skills Desire for Improvement Financial Resources/Insurance Housing Social Support Talents/Skills Transportation  ADL's:  Intact  Cognition: WNL  Sleep:  Good    Assessment and Plan: Patient appears to be doing well on her current regimen.  She is gradually building up the doses to her original doses.  1. Attention deficit hyperactivity disorder (ADHD), predominantly inattentive type  - amphetamine-dextroamphetamine (ADDERALL XR) 20 MG 24 hr capsule; Take 2 capsules in the morning  Dispense: 60 capsule; Refill: 0 - amphetamine-dextroamphetamine (ADDERALL XR) 20 MG 24 hr capsule; Take 2 capsules in the morning  Dispense: 60 capsule; Refill: 0 - amphetamine-dextroamphetamine (ADDERALL XR) 20 MG 24 hr capsule; Take 2 capsules in the morning  Dispense: 60 capsule; Refill: 0  2. Depression with anxiety  - buPROPion (WELLBUTRIN XL) 300 MG 24 hr tablet; Take 1 tablet (300 mg total) by mouth every morning.  Dispense: 90 tablet; Refill: 0 - LORazepam (ATIVAN) 0.5 MG tablet; Take 1 tablet (0.5 mg total) by mouth 2 (two) times daily as needed for anxiety.  Dispense: 45 tablet; Refill: 1   F/up in 3 months.  Zena Amos, MD 01/20/2020, 3:56 PM

## 2020-01-21 ENCOUNTER — Encounter: Payer: Self-pay | Admitting: Surgery

## 2020-01-21 ENCOUNTER — Ambulatory Visit (INDEPENDENT_AMBULATORY_CARE_PROVIDER_SITE_OTHER): Payer: Managed Care, Other (non HMO) | Admitting: Surgery

## 2020-01-21 ENCOUNTER — Other Ambulatory Visit: Payer: Self-pay

## 2020-01-21 VITALS — BP 123/81 | HR 120 | Temp 96.3°F | Ht 62.0 in | Wt 167.4 lb

## 2020-01-21 DIAGNOSIS — L02415 Cutaneous abscess of right lower limb: Secondary | ICD-10-CM | POA: Diagnosis not present

## 2020-01-21 DIAGNOSIS — L02419 Cutaneous abscess of limb, unspecified: Secondary | ICD-10-CM | POA: Diagnosis not present

## 2020-01-21 MED ORDER — CIPROFLOXACIN HCL 500 MG PO TABS
500.0000 mg | ORAL_TABLET | Freq: Two times a day (BID) | ORAL | 0 refills | Status: AC
Start: 2020-01-21 — End: 2020-01-31

## 2020-01-21 NOTE — Patient Instructions (Signed)
We will start you on a secondary antibiotic. You will use warm compresses to the area several times a day. We have made an appointment for you to come in on Monday for a follow up with Dr Everlene Farrier.

## 2020-01-21 NOTE — Progress Notes (Signed)
[facility] POST-OP OFFICE VISIT  01/21/2020  HPI: Dawn Ortiz is a 29 y.o. female 2 weeks s/p incision and drainage right axillary abscess.  She was initiated on clindamycin, continued on it postop until her culture showed resistance.  She was then switched to doxycycline, has done extraordinarily well and has near complete healing at present.  However today she has a small recurrence of a previously drained area on her right medial proximal thigh, mildly tender slight degree of induration and a linear area of fluctuance/fluid.  No drainage thus far.  Has not had good results with warm compresses.  She is somewhat traumatized by in office procedures, offered I&D with and without local, offered antibiotics.  She is elected to start Cipro presumptively, with a follow-up on Monday should this area progress.  Vital signs: BP 123/81    Pulse (!) 120    Temp (!) 96.3 F (35.7 C)    Ht 5\' 2"  (1.575 m)    Wt 167 lb 6.4 oz (75.9 kg)    LMP 01/21/2020 (Exact Date)    SpO2 99%    BMI 30.62 kg/m    Physical Exam: Constitutional: She appears well, nontoxic.  Skin: Right axilla wound is nearly completely healed with a small linear area of granulation.  There is no underlying induration, fluctuance or evidence of drainage. Proximal medial right thigh there is a linear area of fluctuance like a watery cystic area.  It appears to be either under scarring or stretch mark scarring.  Minimal adjacent induration.  No evidence of drainage, no appreciable calor.   Assessment/Plan: This is a 29 y.o. female 2 weeks s/p I&D, now with new lesion involving right thigh.  Patient Active Problem List   Diagnosis Date Noted   Dyspepsia    Abdominal pain 07/19/2019   Intractable nausea and vomiting 07/19/2019   HTN (hypertension) 07/19/2019   Attention deficit hyperactivity disorder (ADHD), 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   Depression with anxiety 05/18/2019   Migraine without  status migrainosus, not intractable 04/29/2019   Type 2 diabetes mellitus with hyperglycemia, with long-term current use of insulin (HCC) 02/06/2019   Anxiety    Diabetes mellitus without complication (HCC)    GERD (gastroesophageal reflux disease)    Axillary abscess 07/02/2016    -See above, will start Cipro empirically as this appeared to develop while taking doxycycline.  We have arranged for follow-up on Monday per her request.  Infectious disease consultation.   Monday M.D., FACS 01/21/2020, 9:53 AM

## 2020-01-25 ENCOUNTER — Ambulatory Visit: Payer: Managed Care, Other (non HMO) | Admitting: Surgery

## 2020-02-02 ENCOUNTER — Ambulatory Visit: Payer: Managed Care, Other (non HMO) | Admitting: Infectious Diseases

## 2020-02-05 ENCOUNTER — Telehealth: Payer: Self-pay

## 2020-02-05 ENCOUNTER — Encounter: Payer: Self-pay | Admitting: Surgery

## 2020-02-05 DIAGNOSIS — L02415 Cutaneous abscess of right lower limb: Secondary | ICD-10-CM

## 2020-02-05 MED ORDER — CIPROFLOXACIN HCL 500 MG PO TABS: 500.0000 mg | ORAL_TABLET | Freq: Two times a day (BID) | ORAL | 0 refills | Status: AC

## 2020-02-05 NOTE — Telephone Encounter (Signed)
Per Dr.Rodenberg OK to give patient refill on the Cipro prescription and to notify patient to give our office a call if needed. Patient verbalized understanding and has no further questions or concerns.

## 2020-02-09 ENCOUNTER — Ambulatory Visit: Payer: Managed Care, Other (non HMO) | Admitting: Infectious Diseases

## 2020-02-16 ENCOUNTER — Other Ambulatory Visit (HOSPITAL_COMMUNITY)
Admission: RE | Admit: 2020-02-16 | Discharge: 2020-02-16 | Disposition: A | Discharge status: Home / Self Care | Payer: Managed Care, Other (non HMO) | Source: Ambulatory Visit | Attending: Infectious Diseases | Admitting: Infectious Diseases

## 2020-02-16 ENCOUNTER — Encounter: Payer: Self-pay | Admitting: Infectious Diseases

## 2020-02-16 ENCOUNTER — Other Ambulatory Visit: Payer: Self-pay

## 2020-02-16 ENCOUNTER — Ambulatory Visit: Payer: Managed Care, Other (non HMO) | Admitting: Infectious Diseases

## 2020-02-16 VITALS — BP 127/78 | HR 103 | Temp 97.5°F | Resp 16 | Ht 62.0 in | Wt 170.0 lb

## 2020-02-16 DIAGNOSIS — L0291 Cutaneous abscess, unspecified: Secondary | ICD-10-CM | POA: Insufficient documentation

## 2020-02-16 DIAGNOSIS — L02411 Cutaneous abscess of right axilla: Secondary | ICD-10-CM | POA: Insufficient documentation

## 2020-02-16 DIAGNOSIS — Z881 Allergy status to other antibiotic agents status: Secondary | ICD-10-CM | POA: Insufficient documentation

## 2020-02-16 DIAGNOSIS — B957 Other staphylococcus as the cause of diseases classified elsewhere: Secondary | ICD-10-CM | POA: Insufficient documentation

## 2020-02-16 DIAGNOSIS — Z79899 Other long term (current) drug therapy: Secondary | ICD-10-CM | POA: Insufficient documentation

## 2020-02-16 DIAGNOSIS — Z87891 Personal history of nicotine dependence: Secondary | ICD-10-CM | POA: Insufficient documentation

## 2020-02-16 DIAGNOSIS — L02412 Cutaneous abscess of left axilla: Secondary | ICD-10-CM | POA: Insufficient documentation

## 2020-02-16 DIAGNOSIS — Z792 Long term (current) use of antibiotics: Secondary | ICD-10-CM | POA: Insufficient documentation

## 2020-02-16 DIAGNOSIS — Z840 Family history of diseases of the skin and subcutaneous tissue: Secondary | ICD-10-CM | POA: Insufficient documentation

## 2020-02-16 DIAGNOSIS — L732 Hidradenitis suppurativa: Secondary | ICD-10-CM

## 2020-02-16 DIAGNOSIS — L02415 Cutaneous abscess of right lower limb: Secondary | ICD-10-CM | POA: Insufficient documentation

## 2020-02-16 DIAGNOSIS — K219 Gastro-esophageal reflux disease without esophagitis: Secondary | ICD-10-CM | POA: Insufficient documentation

## 2020-02-16 DIAGNOSIS — F419 Anxiety disorder, unspecified: Secondary | ICD-10-CM | POA: Insufficient documentation

## 2020-02-16 DIAGNOSIS — Z794 Long term (current) use of insulin: Secondary | ICD-10-CM | POA: Insufficient documentation

## 2020-02-16 DIAGNOSIS — F988 Other specified behavioral and emotional disorders with onset usually occurring in childhood and adolescence: Secondary | ICD-10-CM | POA: Insufficient documentation

## 2020-02-16 DIAGNOSIS — L0591 Pilonidal cyst without abscess: Secondary | ICD-10-CM

## 2020-02-16 DIAGNOSIS — L02416 Cutaneous abscess of left lower limb: Secondary | ICD-10-CM | POA: Insufficient documentation

## 2020-02-16 DIAGNOSIS — E119 Type 2 diabetes mellitus without complications: Secondary | ICD-10-CM | POA: Insufficient documentation

## 2020-02-16 DIAGNOSIS — N611 Abscess of the breast and nipple: Secondary | ICD-10-CM | POA: Insufficient documentation

## 2020-02-16 LAB — HIV ANTIBODY (ROUTINE TESTING W REFLEX): HIV Screen 4th Generation wRfx: NONREACTIVE

## 2020-02-16 NOTE — Patient Instructions (Addendum)
You have had abscesses since the age of 39- you get it in your axillae, inner thighs, intergluteal area, breast You likely have hidradenitis suppurativa - currently a mild form You have had many abscesses cultured but never had MSSA or MRSA-  The last culture was staph lugdunensis and staph epidermidis from your rt axilla- you currently are on ciprofloxacin. You have taken short courses of clinda, doxy before You will have to go back to see your dermatologist. There are many treatment options they can offer-one of them is an antibiotic of the tetracycline group like Doxy, minocycline given for a few months. Today I will check your immunoglobulins to make sure that you dont have a deficiency. Increase your intake of water- follow precautions while shaving

## 2020-02-16 NOTE — Progress Notes (Signed)
NAME: Dawn Ortiz  DOB: 10-05-1990  MRN: [id number]  Date/Time: 02/16/2020 10:37 AM   Subjective:   ? Dawn Ortiz is a 29 y.o. with a history of DM since the age of 25. ,Recurrent abscesses for the past 10 to 14 years- usually in axilla or groin, breast area, has cultured different organisms-is referred to me for recurrent abscesses. As per patient she has had multiple abscesses occurring in the axilla or groin area or breast and has been IND multiple times. In June 2021 she had an abscess in the right axilla which was I&D by surgeon and the culture was staph lugdunensis and staph epidermidis is 2 skin bacteria.  She was initially on clindamycin and then changed to Doxy and now is on ciprofloxacin because she had a new spot coming in the right inner thigh. In August 2020 the culture from the left axilla was E. coli. In October 2019 the abscess culture from the inner thigh was [facility] MAGNA  FEW ANAEROCOCCUS PREVOTII She has not been colonized with MRSA or MSSA. She works as a Industrial/product designer. There is a family history of hidradenitis suppurativa.  Her father had these lesions when he was younger  Past Medical History:  Diagnosis Date  . Abscess of left breast   . Abscess of left thigh   . Abscess of right breast   . Abscess of right thigh   . Abscess of the breast and nipple 03/02/2019  . Anxiety   . Axillary abscess 07/02/2016  . Biliary colic   . Diabetes mellitus without complication (HCC)   . GERD (gastroesophageal reflux disease)   . Leukocytosis 07/19/2019  . Migraines     Past Surgical History:  Procedure Laterality Date  . CHOLECYSTECTOMY    . ESOPHAGOGASTRODUODENOSCOPY (EGD) WITH PROPOFOL N/A 10/01/2019   Procedure: ESOPHAGOGASTRODUODENOSCOPY (EGD) WITH PROPOFOL;  Surgeon: Toney Reil, MD;  Location: [facility]COPY;  Service: Gastroenterology;  Laterality: N/A;  . I & D EXTREMITY Left 03/05/2019   Procedure: Irrigation and debridement;  Surgeon: Henrene Dodge,  MD;  Location: [facility] ORS;  Service: General;  Laterality: Left;  . INCISION AND DRAINAGE     right breast x1 and left groin x1 and left axilla  . INCISION AND DRAINAGE ABSCESS Left 07/02/2016   Procedure: INCISION AND DRAINAGE ABSCESS;  Surgeon: Lattie Haw, MD;  Location: [facility] ORS;  Service: General;  Laterality: Left;  . INCISION AND DRAINAGE ABSCESS Left 04/18/2017   Procedure: INCISION AND DRAINAGE ABSCESS-LEFT GROIN;  Surgeon: Ancil Linsey, MD;  Location: [facility] ORS;  Service: General;  Laterality: Left;  . INCISION AND DRAINAGE ABSCESS Right 05/21/2018   Procedure: INCISION AND DRAINAGE ABSCESS- RIGHT THIGH;  Surgeon: Henrene Dodge, MD;  Location: [facility] OR[facility];  Service: General;  Laterality: Right;  . IRRIGATION AND DEBRIDEMENT ABSCESS Right 04/09/2018   Procedure: IRRIGATION AND DEBRIDEMENT ABSCESS;  Surgeon: Henrene Dodge, MD;  Location: [facility] OR[facility];  Service: General;  Laterality: Right;  . IRRIGATION AND DEBRIDEMENT ABSCESS Left 05/21/2018   Procedure: IRRIGATION AND DEBRIDEMENT BREAST ABSCESS;  Surgeon: Henrene Dodge, MD;  Location: [facility] OR[facility];  Service: General;  Laterality: Left;  . IRRIGATION AND DEBRIDEMENT ABSCESS Right 01/06/2020   Procedure: MINOR INCISION AND DRAINAGE OF ABSCESS, right axillary;  Surgeon: Campbell Lerner, MD;  Location: [facility] ORS;  Service: General;  Laterality: Right;  . PILONIDAL CYST EXCISION  12/03/2014   Procedure: CYST EXCISION PILONIDAL EXTENSIVE;  Surgeon: Duwaine Maxin, MD;  Location: [facility] ORS;  Service: General;;  Social History   Socioeconomic History  . Marital status: Single    Spouse name: Not on file  . Number of children: Not on file  . Years of education: Not on file  . Highest education level: Not on file  Occupational History  . Not on file  Tobacco Use  . Smoking status: Former Smoker    Types: Cigarettes  . Smokeless tobacco: Never Used  Vaping Use  . Vaping Use: Never used  Substance and Sexual Activity  . Alcohol use: Yes     Alcohol/week: 1.0 - 2.0 standard drink    Types: 1 - 2 Glasses of wine per week    Comment: none last 24hrs  . Drug use: No  . Sexual activity: Yes    Birth control/protection: Implant  Other Topics Concern  . Not on file  Social History Narrative   DPR mom Dawn Ortiz [phone]    Dad Dawn Ortiz [phone]    No kids    Works for [employer]   Social Determinants of Health   Financial Resource Strain:   . Difficulty of Paying Living Expenses:   Food Insecurity:   . Worried About Programme researcher, broadcasting/film/video in the Last Year:   . Barista in the Last Year:   Transportation Needs:   . Freight forwarder (Medical):   Marland Kitchen Lack of Transportation (Non-Medical):   Physical Activity:   . Days of Exercise per Week:   . Minutes of Exercise per Session:   Stress:   . Feeling of Stress :   Social Connections:   . Frequency of Communication with Friends and Family:   . Frequency of Social Gatherings with Friends and Family:   . Attends Religious Services:   . Active Member of Clubs or Organizations:   . Attends Banker Meetings:   Marland Kitchen Marital Status:   Intimate Partner Violence:   . Fear of Current or Ex-Partner:   . Emotionally Abused:   Marland Kitchen Physically Abused:   . Sexually Abused:     Family History  Problem Relation Age of Onset  . Healthy Mother   . Hypertension Father   . Diabetes Father   . Heart disease Father   . Sarcoidosis Father   . Healthy Brother   . Obesity Brother   . Cancer Paternal Grandfather        ?prostate   . Hypertension Other    Allergies  Allergen Reactions  . Sulfa Antibiotics Hives    ? Current Outpatient Medications  Medication Sig Dispense Refill  . amphetamine-dextroamphetamine (ADDERALL XR) 20 MG 24 hr capsule Take 2 capsules in the morning 60 capsule 0  . [START ON 02/18/2020] amphetamine-dextroamphetamine (ADDERALL XR) 20 MG 24 hr capsule Take 2 capsules in the morning 60 capsule 0  . [START ON 03/19/2020]  amphetamine-dextroamphetamine (ADDERALL XR) 20 MG 24 hr capsule Take 2 capsules in the morning 60 capsule 0  . B-D ULTRAFINE III SHORT PEN 31G X 8 MM MISC     . buPROPion (WELLBUTRIN XL) 300 MG 24 hr tablet Take 1 tablet (300 mg total) by mouth every morning. 90 tablet 0  . ciprofloxacin (CIPRO) 500 MG tablet Take 500 mg by mouth 2 (two) times daily.    . Clindamycin Phosphate foam     . doxycycline (DORYX) 100 MG EC tablet Take 1 tablet (100 mg total) by mouth 2 (two) times daily. 60 tablet 1  . Insulin Degludec (TRESIBA) 100 UNIT/ML  SOLN Inject 70 Units into the skin daily.     . Lancets (ONETOUCH DELICA PLUS LANCET33G) MISC     . lisinopril (PRINIVIL,ZESTRIL) 2.5 MG tablet Take 2.5 mg by mouth daily.    Marland Kitchen. LORazepam (ATIVAN) 0.5 MG tablet Take 1 tablet (0.5 mg total) by mouth 2 (two) times daily as needed for anxiety. 30 tablet 1  . metFORMIN (GLUCOPHAGE-XR) 500 MG 24 hr tablet Take 500 mg by mouth 2 (two) times daily.    . norelgestromin-ethinyl estradiol Burr Medico(XULANE) 150-35 MCG/24HR transdermal patch Place 1 patch onto the skin once a week. xulane patch    . omeprazole (PRILOSEC) 40 MG capsule Take 1 capsule (40 mg total) by mouth daily. 30 minutes before food 90 capsule 3  . ondansetron (ZOFRAN) 4 MG tablet Take 1 tablet (4 mg total) by mouth every 8 (eight) hours as needed for nausea or vomiting. 20 tablet 0  . ONETOUCH ULTRA test strip     . Semaglutide,0.25 or 0.5MG /DOS, (OZEMPIC, 0.25 OR 0.5 MG/DOSE,) 2 MG/1.5ML SOPN Inject into the skin once a week.     No current facility-administered medications for this visit.     Abtx:  Anti-infectives (From admission, onward)   None      REVIEW OF SYSTEMS:  Const: negative fever, negative chills, negative weight loss Eyes: negative diplopia or visual changes, negative eye pain ENT: negative coryza, negative sore throat Resp: negative cough, hemoptysis, dyspnea Cards: negative for chest pain, palpitations, lower extremity edema GU: negative  for frequency, dysuria and hematuria GI: Negative for abdominal pain, diarrhea, bleeding, constipation Skin: negative for rash and pruritus Heme: negative for easy bruising and gum/nose bleeding MS: negative for myalgias, arthralgias, back pain and muscle weakness Neurolo:negative for headaches, dizziness, vertigo, memory problems  Psych: negative for feelings of anxiety, depression  Endocrine: negative for thyroid, diabetes Allergy/Immunology- negative for any medication or food allergies ? Pertinent Positives include : Objective:  VITALS:  BP 127/78   Pulse (!) 103   Temp (!) 97.5 F (36.4 C)   Resp 16   Ht 5\' 2"  (1.575 m)   Wt 170 lb (77.1 kg)   LMP 01/21/2020 (Exact Date)   SpO2 98%   BMI 31.09 kg/m  PHYSICAL EXAM:  General: Alert, cooperative, no distress, appears stated age.  Head: Normocephalic, without obvious abnormality, atraumatic. Eyes: Conjunctivae clear, anicteric sclerae. Pupils are equal ENT Nares normal. No drainage or sinus tenderness. Lips, mucosa, and tongue normal. No Thrush Neck: Supple, symmetrical, no adenopathy, thyroid: non tender no carotid bruit and no JVD. Back: No CVA tenderness. Lungs: Clear to auscultation bilaterally. No Wheezing or Rhonchi. No rales. Heart: Regular rate and rhythm, no murmur, rub or gallop. Abdomen: Soft, non-tender,not distended. Bowel sounds normal. No masses Extremities: atraumatic, no cyanosis. No edema. No clubbing Skin: Scar over the left axilla.  Right axilla abscess has been drained Right inner thigh scar Pubic area nodule for the ingrowing hair Lymph: Cervical, supraclavicular normal. Neurologic: Grossly non-focal  ? Impression/Recommendation ? Patient with recurrent abscesses in the axilla, in the inner thigh groin area and on her breasts.  She has grown different bacteria at different times.  No MRSA or MSSA seen in any of these abscess cultures This is very likely hidradenitis suppurativa ? She is currently  on Cipro.  Recommend following with dermatology.  May need prolonged course of doxy.  There are other known antibiotic options available for hidradenitis suppurativa  Okay to use clindamycin local foam.  Would avoid prolonged use  of quinolone. We will check immunoglobulins levels, RPR and HIV  Diabetes mellitus on Metformin, Ozempic and Tresiba.  ADD on Adderall which she takes it when needed   ___________________________________________________ Discussed with patient in great detail.  We will follow her as needed Note:  This document was prepared using Dragon voice recognition software and may include unintentional dictation errors.

## 2020-02-17 LAB — RPR: RPR Ser Ql: NONREACTIVE

## 2020-02-17 LAB — IGG, IGA, IGM
IgA: 295 mg/dL (ref 87–352)
IgG (Immunoglobin G), Serum: 1525 mg/dL (ref 586–1602)
IgM (Immunoglobulin M), Srm: 114 mg/dL (ref 26–217)

## 2020-03-29 LAB — POC COVID-19 (LIAT IN HOUSE): SARS-CoV-2: NOT DETECTED

## 2020-04-19 ENCOUNTER — Telehealth (HOSPITAL_COMMUNITY): Payer: 59 | Admitting: Psychiatry

## 2020-04-28 ENCOUNTER — Encounter: Payer: Managed Care, Other (non HMO) | Admitting: Internal Medicine

## 2020-04-29 ENCOUNTER — Ambulatory Visit (INDEPENDENT_AMBULATORY_CARE_PROVIDER_SITE_OTHER): Payer: Managed Care, Other (non HMO) | Admitting: Internal Medicine

## 2020-04-29 ENCOUNTER — Encounter: Payer: Self-pay | Admitting: Internal Medicine

## 2020-04-29 ENCOUNTER — Other Ambulatory Visit: Payer: Self-pay

## 2020-04-29 VITALS — BP 124/82 | HR 106 | Temp 98.2°F | Ht 62.0 in | Wt 171.2 lb

## 2020-04-29 DIAGNOSIS — Z23 Encounter for immunization: Secondary | ICD-10-CM

## 2020-04-29 DIAGNOSIS — L732 Hidradenitis suppurativa: Secondary | ICD-10-CM | POA: Insufficient documentation

## 2020-04-29 DIAGNOSIS — E1159 Type 2 diabetes mellitus with other circulatory complications: Secondary | ICD-10-CM | POA: Insufficient documentation

## 2020-04-29 DIAGNOSIS — Z0000 Encounter for general adult medical examination without abnormal findings: Secondary | ICD-10-CM | POA: Diagnosis not present

## 2020-04-29 DIAGNOSIS — E538 Deficiency of other specified B group vitamins: Secondary | ICD-10-CM

## 2020-04-29 DIAGNOSIS — D509 Iron deficiency anemia, unspecified: Secondary | ICD-10-CM

## 2020-04-29 DIAGNOSIS — E669 Obesity, unspecified: Secondary | ICD-10-CM

## 2020-04-29 DIAGNOSIS — R000 Tachycardia, unspecified: Secondary | ICD-10-CM

## 2020-04-29 DIAGNOSIS — E559 Vitamin D deficiency, unspecified: Secondary | ICD-10-CM

## 2020-04-29 DIAGNOSIS — I152 Hypertension secondary to endocrine disorders: Secondary | ICD-10-CM

## 2020-04-29 HISTORY — DX: Tachycardia, unspecified: R00.0

## 2020-04-29 MED ORDER — CIPROFLOXACIN HCL 500 MG PO TABS: 500.0000 mg | ORAL_TABLET | Freq: Two times a day (BID) | ORAL | 2 refills | Status: DC

## 2020-04-29 NOTE — Patient Instructions (Addendum)
Consider pneumonia 23 vaccine   -schedule nurse visit   Vitamin D3 4000 IU daily

## 2020-04-29 NOTE — Progress Notes (Signed)
Chief Complaint  Patient presents with   Annual Exam   Immunizations    flu shot   Annual  1. DM 2 controlled on tresiba 20 units and ozempic 1 weekly and metformin XR 500 3 pills daily A1C 03/04/20 6.0 with Endocrine Dr. Gershon Crane'Connell and eye MD exam done 09/18/19 my eye doc and foot exam 03/04/20 Endocrine A1C and diabetes improved  Disc pna 23 vaccine in future  Agreeable flu shot today  utd covid 2/2  Off lis 2.5 mg qd per endocrine 2. Hidradenitis suppurativa doxycycline does not help in the past wants prn Cipro which helps and she is thinking [facility]    Review of Systems  Constitutional: Positive for weight loss.  HENT: Negative for hearing loss.   Eyes: Negative for blurred vision.  Respiratory: Negative for shortness of breath.   Cardiovascular: Negative for chest pain.  Gastrointestinal: Negative for abdominal pain.  Musculoskeletal: Negative for falls.  Skin: Negative for rash.  Neurological: Negative for headaches.  Psychiatric/Behavioral: Negative for depression.   Past Medical History:  Diagnosis Date   Abscess of left breast    Abscess of left thigh    Abscess of right breast    Abscess of right thigh    Abscess of the breast and nipple 03/02/2019   Anxiety    Axillary abscess 07/02/2016   Biliary colic    Diabetes mellitus without complication (HCC)    GERD (gastroesophageal reflux disease)    Leukocytosis 07/19/2019   Migraines    Past Surgical History:  Procedure Laterality Date   CHOLECYSTECTOMY     ESOPHAGOGASTRODUODENOSCOPY (EGD) WITH PROPOFOL N/A 10/01/2019   Procedure: ESOPHAGOGASTRODUODENOSCOPY (EGD) WITH PROPOFOL;  Surgeon: Toney ReilVanga, Rohini Reddy, MD;  Location: [facility] ENDOSCOPY;  Service: Gastroenterology;  Laterality: N/A;   I & D EXTREMITY Left 03/05/2019   Procedure: Irrigation and debridement;  Surgeon: Henrene DodgePiscoya, Jose, MD;  Location: [facility];  Service: General;  Laterality: Left;   INCISION AND DRAINAGE     right breast  x1 and left groin x1 and left axilla   INCISION AND DRAINAGE ABSCESS Left 07/02/2016   Procedure: INCISION AND DRAINAGE ABSCESS;  Surgeon: Lattie Hawichard E Cooper, MD;  Location: [facility];  Service: General;  Laterality: Left;   INCISION AND DRAINAGE ABSCESS Left 04/18/2017   Procedure: INCISION AND DRAINAGE ABSCESS-LEFT GROIN;  Surgeon: Ancil Linseyavis, [name], MD;  Location: [facility];  Service: General;  Laterality: Left;   INCISION AND DRAINAGE ABSCESS Right 05/21/2018   Procedure: INCISION AND DRAINAGE ABSCESS- RIGHT THIGH;  Surgeon: Henrene DodgePiscoya, Jose, MD;  Location: [facility];  Service: General;  Laterality: Right;   IRRIGATION AND DEBRIDEMENT ABSCESS Right 04/09/2018   Procedure: IRRIGATION AND DEBRIDEMENT ABSCESS;  Surgeon: Henrene DodgePiscoya, Jose, MD;  Location: [facility];  Service: General;  Laterality: Right;   IRRIGATION AND DEBRIDEMENT ABSCESS Left 05/21/2018   Procedure: IRRIGATION AND DEBRIDEMENT BREAST ABSCESS;  Surgeon: Henrene DodgePiscoya, Jose, MD;  Location: [facility];  Service: General;  Laterality: Left;   IRRIGATION AND DEBRIDEMENT ABSCESS Right 01/06/2020   Procedure: MINOR INCISION AND DRAINAGE OF ABSCESS, right axillary;  Surgeon: Campbell Lernerodenberg, Denny, MD;  Location: [facility];  Service: General;  Laterality: Right;   PILONIDAL CYST EXCISION  12/03/2014   Procedure: CYST EXCISION PILONIDAL EXTENSIVE;  Surgeon: Duwaine MaxinWilliam Marterre, MD;  Location: [facility] OR[facility];  Service: General;;   Family History  Problem Relation Age of Onset   Healthy Mother    Hypertension Father    Diabetes Father    Heart disease  Father    Sarcoidosis Father    Healthy Brother    Obesity Brother    Cancer Paternal Grandfather        colon and also siblings had cancer ?type   Hypertension Other    Social History   Socioeconomic History   Marital status: Single    Spouse name: Not on file   Number of children: Not on file   Years of education: Not on file   Highest education level: Not on file  Occupational History   Not on  file  Tobacco Use   Smoking status: Former Smoker    Types: Cigarettes   Smokeless tobacco: Never Used  Building services engineer Use: Never used  Substance and Sexual Activity   Alcohol use: Yes    Alcohol/week: 1.0 - 2.0 standard drink    Types: 1 - 2 Glasses of wine per week    Comment: none last 24hrs   Drug use: No   Sexual activity: Yes    Birth control/protection: Implant  Other Topics Concern   Not on file  Social History Narrative   DPR mom Avi Archuleta [phone]    Dad Arnika Larzelere [phone]    No kids    Works for [employer]   Social Determinants of Health   Financial Resource Strain:    Difficulty of Paying Living Expenses: Not on file  Food Insecurity:    Worried About Programme researcher, broadcasting/film/video in the Last Year: Not on file   [employer] of Food in the Last Year: Not on file  Transportation Needs:    Lack of Transportation (Medical): Not on file   Lack of Transportation (Non-Medical): Not on file  Physical Activity:    Days of Exercise per Week: Not on file   Minutes of Exercise per Session: Not on file  Stress:    Feeling of Stress : Not on file  Social Connections:    Frequency of Communication with Friends and Family: Not on file   Frequency of Social Gatherings with Friends and Family: Not on file   Attends Religious Services: Not on file   Active Member of Clubs or Organizations: Not on file   Attends Banker Meetings: Not on file   Marital Status: Not on file  Intimate Partner Violence:    Fear of Current or Ex-Partner: Not on file   Emotionally Abused: Not on file   Physically Abused: Not on file   Sexually Abused: Not on file   Current Meds  Medication Sig   amphetamine-dextroamphetamine (ADDERALL XR) 20 MG 24 hr capsule Take 2 capsules in the morning   amphetamine-dextroamphetamine (ADDERALL XR) 20 MG 24 hr capsule Take 2 capsules in the morning   amphetamine-dextroamphetamine (ADDERALL XR) 20 MG 24 hr  capsule Take 2 capsules in the morning   B-D ULTRAFINE III SHORT PEN 31G X 8 MM MISC    buPROPion (WELLBUTRIN XL) 300 MG 24 hr tablet Take 1 tablet (300 mg total) by mouth every morning.   ciprofloxacin (CIPRO) 500 MG tablet Take 1 tablet (500 mg total) by mouth 2 (two) times daily. With food   Clindamycin Phosphate foam    Insulin Degludec (TRESIBA) 100 UNIT/ML SOLN Inject 20 Units into the skin daily.    Lancets (ONETOUCH DELICA PLUS LANCET33G) MISC    LORazepam (ATIVAN) 0.5 MG tablet Take 1 tablet (0.5 mg total) by mouth 2 (two) times daily as needed for anxiety.   metFORMIN (GLUCOPHAGE-XR)  500 MG 24 hr tablet Take 500 mg by mouth in the morning, at noon, and at bedtime.    norelgestromin-ethinyl estradiol Burr Medico) 150-35 MCG/24HR transdermal patch Place 1 patch onto the skin once a week. xulane patch   omeprazole (PRILOSEC) 40 MG capsule Take 1 capsule (40 mg total) by mouth daily. 30 minutes before food   ondansetron (ZOFRAN) 4 MG tablet Take 1 tablet (4 mg total) by mouth every 8 (eight) hours as needed for nausea or vomiting.   ONETOUCH ULTRA test strip    Semaglutide,0.25 or 0.5MG /DOS, (OZEMPIC, 0.25 OR 0.5 MG/DOSE,) 2 MG/1.5ML SOPN Inject 1 mg into the skin once a week.    [DISCONTINUED] ciprofloxacin (CIPRO) 500 MG tablet Take 500 mg by mouth 2 (two) times daily.   [DISCONTINUED] doxycycline (DORYX) 100 MG EC tablet Take 1 tablet (100 mg total) by mouth 2 (two) times daily.   [DISCONTINUED] lisinopril (PRINIVIL,ZESTRIL) 2.5 MG tablet Take 2.5 mg by mouth daily.   Allergies  Allergen Reactions   Sulfa Antibiotics Hives   Recent Results (from the past 2160 hour(s))  HIV antibody (with reflex)     Status: None   Collection Time: 02/16/20 12:10 PM  Result Value Ref Range   HIV Screen 4th Generation wRfx Non Reactive Non Reactive    Comment: Performed at [facility], [address] [place], [place] [address]  RPR     Status: None   Collection Time: 02/16/20  12:10 PM  Result Value Ref Range   RPR Ser Ql NON REACTIVE NON REACTIVE    Comment: Performed at [facility], [address], [place], [place] [address]  IgG, IgA, IgM     Status: None   Collection Time: 02/16/20 12:10 PM  Result Value Ref Range   IgG (Immunoglobin G), Serum 1,525 586 - 1,602 mg/dL   IgA 875 87 - 643 mg/dL   IgM (Immunoglobulin M), Srm 114 26 - 217 mg/dL    Comment: (NOTE) Performed At: [facility] [address] [place], [place] [id number] Jolene Schimke MD [id number]    Objective  Body mass index is 31.31 kg/m. Wt Readings from Last 3 Encounters:  04/29/20 171 lb 3.2 oz (77.7 kg)  02/16/20 170 lb (77.1 kg)  01/21/20 167 lb 6.4 oz (75.9 kg)   Temp Readings from Last 3 Encounters:  04/29/20 98.2 F (36.8 C) (Oral)  02/16/20 (!) 97.5 F (36.4 C)  01/21/20 (!) 96.3 F (35.7 C)   BP Readings from Last 3 Encounters:  04/29/20 124/82  02/16/20 127/78  01/21/20 123/81   Pulse Readings from Last 3 Encounters:  04/29/20 (!) 106  02/16/20 (!) 103  01/21/20 (!) 120    Physical Exam Vitals and nursing note reviewed.  Constitutional:      Appearance: Normal appearance. She is well-developed and well-groomed. She is obese.  HENT:     Head: Normocephalic and atraumatic.  Eyes:     Conjunctiva/sclera: Conjunctivae normal.     Pupils: Pupils are equal, round, and reactive to light.  Cardiovascular:     Rate and Rhythm: Regular rhythm. Tachycardia present.     Heart sounds: Normal heart sounds. No murmur heard.   Chest:     Chest wall: No mass.     Breasts: Breasts are asymmetrical.        Right: Normal.        Left: Normal.     Comments: HS axilla b/l  Left breast larger than right not new Lymphadenopathy:  Upper Body:     Right upper body: No axillary adenopathy.     Left upper body: No axillary adenopathy.  Skin:    General: Skin is warm and dry.       Neurological:     General: No focal deficit present.     Mental  Status: She is alert and oriented to person, place, and time. Mental status is at baseline.     Gait: Gait normal.  Psychiatric:        Attention and Perception: Attention and perception normal.        Mood and Affect: Mood and affect normal.        Speech: Speech normal.        Behavior: Behavior normal. Behavior is cooperative.        Thought Content: Thought content normal.        Cognition and Memory: Cognition and memory normal.        Judgment: Judgment normal.     Assessment  Plan  Annual physical exam Flu shot today  moderna 2/2 Pneumonia 23 consider call back to sch  Tdap check ncir for vaccines 2017/18   Pap 10/21/17 normal Margarat Paulino Door  Mammogram age 10 or baseline 35-39  Colonoscopy 45  rec healthy diet and exercise   Hidradenitis suppurativa - Plan: ciprofloxacin (CIPRO) 500 MG tablet bid prn use sparingly doxy did not help per pt  F/u derm other tx  DM 2 with obesity BP controlled  -cont tresiba 20 units qd, metformin xr 500 tid and ozempic 1 mg weekly  Foot exam utd 03/04/20 Urine utd 03/04/20  Eye utd 08/2019 and appt 04/2020 upcoming  F/u Dr. Carlton Adam endocrine   Iron deficiency anemia, unspecified iron deficiency anemia type - Plan: CBC with Differential/Platelet, Iron, TIBC and Ferritin Panel Labs 08/2020 with [facility] endocrine   Vitamin D deficiency - Plan: Vitamin D (25 hydroxy) rec D3 4000 IU qd   B12 deficiency - Plan: B12  Sinus tachycardia  Monitor w/o sx's    I discussed the assessment and treatment plan with the patient. The patient was provided an opportunity to ask questions and all were answered. The patient agreed with the plan and demonstrated an understanding of the instructions.    Provider: Dr. French Ana McLean-Scocuzza-Internal Medicine

## 2020-05-31 ENCOUNTER — Ambulatory Visit: Payer: Managed Care, Other (non HMO)

## 2020-06-01 ENCOUNTER — Telehealth (INDEPENDENT_AMBULATORY_CARE_PROVIDER_SITE_OTHER): Payer: 59 | Admitting: Psychiatry

## 2020-06-01 ENCOUNTER — Other Ambulatory Visit: Payer: Self-pay

## 2020-06-01 ENCOUNTER — Encounter (HOSPITAL_COMMUNITY): Payer: Self-pay | Admitting: Psychiatry

## 2020-06-01 DIAGNOSIS — F418 Other specified anxiety disorders: Secondary | ICD-10-CM | POA: Diagnosis not present

## 2020-06-01 DIAGNOSIS — F900 Attention-deficit hyperactivity disorder, predominantly inattentive type: Secondary | ICD-10-CM

## 2020-06-01 MED ORDER — AMPHETAMINE-DEXTROAMPHET ER 20 MG PO CP24: ORAL_CAPSULE | ORAL | 0 refills | Status: DC

## 2020-06-01 MED ORDER — BUPROPION HCL ER (XL) 300 MG PO TB24: 300.0000 mg | ORAL_TABLET | ORAL | 0 refills | Status: DC

## 2020-06-01 MED ORDER — LORAZEPAM 0.5 MG PO TABS: 0.5000 mg | ORAL_TABLET | Freq: Two times a day (BID) | ORAL | 1 refills | Status: DC | PRN

## 2020-06-01 NOTE — Progress Notes (Signed)
BH MD OP Progress Note  Virtual Visit via Video Note  I connected with EVONE ARSENEAU on 06/01/20 at  2:40 PM EDT by a video enabled telemedicine application and verified that I am speaking with the correct person using two identifiers.  Location: Patient: Home Provider: Clinic   I discussed the limitations of evaluation and management by telemedicine and the availability of in person appointments. The patient expressed understanding and agreed to proceed.  I provided 13 minutes of non-face-to-face time during this encounter.   06/01/2020 2:48 PM Eriko ONEIKA SIMONIAN  MRN:  [id number]  Chief Complaint:  " Everything is the same."   HPI: Patient reported that everything is going well for her.  She informed that she has been trying hard to take her Wellbutrin every day on a consistent basis.  She uses lorazepam only sometimes as needed for anxiety.  She tries to take her Adderall XR daily but sometimes forgets.  She denies any acute issues or concerns at this time. She informed everything is going well and her personal life and also her work life.  Visit Diagnosis:    ICD-10-CM   1. Attention deficit hyperactivity disorder (ADHD), predominantly inattentive type  F90.0 amphetamine-dextroamphetamine (ADDERALL XR) 20 MG 24 hr capsule    amphetamine-dextroamphetamine (ADDERALL XR) 20 MG 24 hr capsule  2. Depression with anxiety  F41.8 LORazepam (ATIVAN) 0.5 MG tablet    buPROPion (WELLBUTRIN XL) 300 MG 24 hr tablet    Past Psychiatric History: Depression, anxiety  Past Medical History:  Past Medical History:  Diagnosis Date  . Abscess of left breast   . Abscess of left thigh   . Abscess of right breast   . Abscess of right thigh   . Abscess of the breast and nipple 03/02/2019  . Anxiety   . Axillary abscess 07/02/2016  . Biliary colic   . Diabetes mellitus without complication (HCC)   . GERD (gastroesophageal reflux disease)   . Leukocytosis 07/19/2019  . Migraines     Past  Surgical History:  Procedure Laterality Date  . CHOLECYSTECTOMY    . ESOPHAGOGASTRODUODENOSCOPY (EGD) WITH PROPOFOL N/A 10/01/2019   Procedure: ESOPHAGOGASTRODUODENOSCOPY (EGD) WITH PROPOFOL;  Surgeon: Toney Reil, MD;  Location: [facility] ENDOSCOPY;  Service: Gastroenterology;  Laterality: N/A;  . I & D EXTREMITY Left 03/05/2019   Procedure: Irrigation and debridement;  Surgeon: Henrene Dodge, MD;  Location: [facility] ORS;  Service: General;  Laterality: Left;  . INCISION AND DRAINAGE     right breast x1 and left groin x1 and left axilla  . INCISION AND DRAINAGE ABSCESS Left 07/02/2016   Procedure: INCISION AND DRAINAGE ABSCESS;  Surgeon: Lattie Haw, MD;  Location: [facility] ORS;  Service: General;  Laterality: Left;  . INCISION AND DRAINAGE ABSCESS Left 04/18/2017   Procedure: INCISION AND DRAINAGE ABSCESS-LEFT GROIN;  Surgeon: Ancil Linsey, MD;  Location: [facility] ORS;  Service: General;  Laterality: Left;  . INCISION AND DRAINAGE ABSCESS Right 05/21/2018   Procedure: INCISION AND DRAINAGE ABSCESS- RIGHT THIGH;  Surgeon: Henrene Dodge, MD;  Location: [facility] ORS;  Service: General;  Laterality: Right;  . IRRIGATION AND DEBRIDEMENT ABSCESS Right 04/09/2018   Procedure: IRRIGATION AND DEBRIDEMENT ABSCESS;  Surgeon: Henrene Dodge, MD;  Location: [facility] ORS;  Service: General;  Laterality: Right;  . IRRIGATION AND DEBRIDEMENT ABSCESS Left 05/21/2018   Procedure: IRRIGATION AND DEBRIDEMENT BREAST ABSCESS;  Surgeon: Henrene Dodge, MD;  Location: [facility] ORS;  Service: General;  Laterality: Left;  . IRRIGATION AND DEBRIDEMENT ABSCESS  Right 01/06/2020   Procedure: MINOR INCISION AND DRAINAGE OF ABSCESS, right axillary;  Surgeon: Campbell Lerner, MD;  Location: [facility] ORS;  Service: General;  Laterality: Right;  . PILONIDAL CYST EXCISION  12/03/2014   Procedure: CYST EXCISION PILONIDAL EXTENSIVE;  Surgeon: Duwaine Maxin, MD;  Location: [facility] ORS;  Service: General;;     Family History:  Family History  Problem  Relation Age of Onset  . Healthy Mother   . Hypertension Father   . Diabetes Father   . Heart disease Father   . Sarcoidosis Father   . Healthy Brother   . Obesity Brother   . Cancer Paternal Grandfather        colon and also siblings had cancer ?type  . Hypertension Other     Social History:  Social History   Socioeconomic History  . Marital status: Single    Spouse name: Not on file  . Number of children: Not on file  . Years of education: Not on file  . Highest education level: Not on file  Occupational History  . Not on file  Tobacco Use  . Smoking status: Former Smoker    Types: Cigarettes  . Smokeless tobacco: Never Used  Vaping Use  . Vaping Use: Never used  Substance and Sexual Activity  . Alcohol use: Yes    Alcohol/week: 1.0 - 2.0 standard drink    Types: 1 - 2 Glasses of wine per week    Comment: none last 24hrs  . Drug use: No  . Sexual activity: Yes    Birth control/protection: Implant  Other Topics Concern  . Not on file  Social History Narrative   DPR mom Damya Comley [phone]    Dad Andrena Margerum [phone]    No kids    Works for [employer]   Social Determinants of Health   Financial Resource Strain:   . Difficulty of Paying Living Expenses: Not on file  Food Insecurity:   . Worried About Programme researcher, broadcasting/film/video in the Last Year: Not on file  . Ran Out of Food in the Last Year: Not on file  Transportation Needs:   . Lack of Transportation (Medical): Not on file  . Lack of Transportation (Non-Medical): Not on file  Physical Activity:   . Days of Exercise per Week: Not on file  . Minutes of Exercise per Session: Not on file  Stress:   . Feeling of Stress : Not on file  Social Connections:   . Frequency of Communication with Friends and Family: Not on file  . Frequency of Social Gatherings with Friends and Family: Not on file  . Attends Religious Services: Not on file  . Active Member of Clubs or Organizations: Not on file  . Attends  Banker Meetings: Not on file  . Marital Status: Not on file    Allergies:  Allergies  Allergen Reactions  . Sulfa Antibiotics Hives    Metabolic Disorder Labs: Lab Results  Component Value Date   HGBA1C 8.9 (H) 07/19/2019   MPG 208.73 07/19/2019   No results found for: PROLACTIN Lab Results  Component Value Date   CHOL 127 02/26/2019   TRIG 96 02/26/2019   HDL 37 (L) 02/26/2019   CHOLHDL 3.4 02/26/2019   LDLCALC 71 02/26/2019   LDLCALC 94 10/21/2017   Lab Results  Component Value Date   TSH 0.773 02/26/2019   TSH 0.425 (L) 10/12/2016    Therapeutic Level Labs: No results found for: LITHIUM  No results found for: VALPROATE No components found for:  CBMZ  Current Medications: Current Outpatient Medications  Medication Sig Dispense Refill  . amphetamine-dextroamphetamine (ADDERALL XR) 20 MG 24 hr capsule Take 2 capsules in the morning 60 capsule 0  . amphetamine-dextroamphetamine (ADDERALL XR) 20 MG 24 hr capsule Take 2 capsules in the morning 60 capsule 0  . [START ON 06/30/2020] amphetamine-dextroamphetamine (ADDERALL XR) 20 MG 24 hr capsule Take 2 capsules in the morning 60 capsule 0  . B-D ULTRAFINE III SHORT PEN 31G X 8 MM MISC     . buPROPion (WELLBUTRIN XL) 300 MG 24 hr tablet Take 1 tablet (300 mg total) by mouth every morning. 90 tablet 0  . ciprofloxacin (CIPRO) 500 MG tablet Take 1 tablet (500 mg total) by mouth 2 (two) times daily. With food 10 tablet 2  . Clindamycin Phosphate foam     . Insulin Degludec (TRESIBA) 100 UNIT/ML SOLN Inject 20 Units into the skin daily.     . Lancets (ONETOUCH DELICA PLUS LANCET33G) MISC     . LORazepam (ATIVAN) 0.5 MG tablet Take 1 tablet (0.5 mg total) by mouth 2 (two) times daily as needed for anxiety. 30 tablet 1  . metFORMIN (GLUCOPHAGE-XR) 500 MG 24 hr tablet Take 500 mg by mouth in the morning, at noon, and at bedtime.     . norelgestromin-ethinyl estradiol Burr Medico(XULANE) 150-35 MCG/24HR transdermal patch Place 1  patch onto the skin once a week. xulane patch    . omeprazole (PRILOSEC) 40 MG capsule Take 1 capsule (40 mg total) by mouth daily. 30 minutes before food 90 capsule 3  . ondansetron (ZOFRAN) 4 MG tablet Take 1 tablet (4 mg total) by mouth every 8 (eight) hours as needed for nausea or vomiting. 20 tablet 0  . ONETOUCH ULTRA test strip     . Semaglutide,0.25 or 0.5MG /DOS, (OZEMPIC, 0.25 OR 0.5 MG/DOSE,) 2 MG/1.5ML SOPN Inject 1 mg into the skin once a week.      No current facility-administered medications for this visit.       Psychiatric Specialty Exam: ROS  There were no vitals taken for this visit.There is no height or weight on file to calculate BMI.  General Appearance: Well Groomed, wearing work uniform  Eye Contact:  Good  Speech:  Clear and Coherent and Normal Rate  Volume:  Normal  Mood:  Euthymic  Affect:  Congruent  Thought Process:  Goal Directed, Linear and Descriptions of Associations: Intact  Orientation:  Full (Time, Place, and Person)  Thought Content: Logical   Suicidal Thoughts:  No  Homicidal Thoughts:  No  Memory:  Recent;   Good Remote;   Good  Judgement:  Good  Insight:  Good  Psychomotor Activity:  Normal  Concentration:  Concentration: Good and Attention Span: Good  Recall:  Good  Fund of Knowledge: Good  Language: Good  Akathisia:  No  Handed:  Right  AIMS (if indicated): not done  Assets:  Communication Skills Desire for Improvement Financial Resources/Insurance Housing Social Support Talents/Skills Transportation  ADL's:  Intact  Cognition: WNL  Sleep:  Good    Assessment and Plan: Patient appears to be stable on her regimen.  1. Attention deficit hyperactivity disorder (ADHD), predominantly inattentive type  - amphetamine-dextroamphetamine (ADDERALL XR) 20 MG 24 hr capsule; Take 2 capsules in the morning  Dispense: 60 capsule; Refill: 0 - amphetamine-dextroamphetamine (ADDERALL XR) 20 MG 24 hr capsule; Take 2 capsules in the morning   Dispense: 60 capsule; Refill: 0 -  amphetamine-dextroamphetamine (ADDERALL XR) 20 MG 24 hr capsule; Take 2 capsules in the morning  Dispense: 60 capsule; Refill: 0  2. Depression with anxiety  - buPROPion (WELLBUTRIN XL) 300 MG 24 hr tablet; Take 1 tablet (300 mg total) by mouth every morning.  Dispense: 90 tablet; Refill: 0 - LORazepam (ATIVAN) 0.5 MG tablet; Take 1 tablet (0.5 mg total) by mouth 2 (two) times daily as needed for anxiety.  Dispense: 45 tablet; Refill: 1  Continue same regimen. F/up in 3 months.  Zena Amos, MD 06/01/2020, 2:48 PM

## 2020-08-04 LAB — COVID-19 & INFLUENZA COMBO (COBAS)
INFLUENZA A: NEGATIVE
INFLUENZA B: NEGATIVE
SARS-CoV-2: POSITIVE — AB

## 2020-08-17 ENCOUNTER — Encounter: Payer: Self-pay | Admitting: Surgery

## 2020-08-17 ENCOUNTER — Other Ambulatory Visit: Payer: Self-pay

## 2020-08-17 ENCOUNTER — Ambulatory Visit (INDEPENDENT_AMBULATORY_CARE_PROVIDER_SITE_OTHER): Payer: Managed Care, Other (non HMO) | Admitting: Surgery

## 2020-08-17 VITALS — BP 146/84 | HR 116 | Temp 98.7°F | Ht 62.0 in | Wt 179.0 lb

## 2020-08-17 DIAGNOSIS — L732 Hidradenitis suppurativa: Secondary | ICD-10-CM | POA: Diagnosis not present

## 2020-08-17 DIAGNOSIS — B379 Candidiasis, unspecified: Secondary | ICD-10-CM | POA: Diagnosis not present

## 2020-08-17 MED ORDER — DOXYCYCLINE HYCLATE 100 MG PO CAPS: 100.0000 mg | ORAL_CAPSULE | Freq: Two times a day (BID) | ORAL | 0 refills | Status: DC

## 2020-08-17 MED ORDER — FLUCONAZOLE 150 MG PO TABS: 150.0000 mg | ORAL_TABLET | Freq: Every day | ORAL | 0 refills | Status: DC

## 2020-08-17 NOTE — Progress Notes (Signed)
08/17/2020  History of Present Illness: Dawn Ortiz is a 30 y.o. female with a history of hydradenitis and recurrent abscesses.  She's had I&D of multiple locations including the bilateral axilla, bilateral breasts, left groin, bilateral thighs.  She most recently had I&D of right axillary abscess in the OR with Dr. Claudine Mouton in June 2021.  She reports that about 5 days ago she noted that the left axilla was starting to get painful and swollen again.  This worsened over the weekend and she started taking a prescription for Ciprofloxacin that her PCP had given her as a precaution during flareup episodes.  She has noted some improvement over the last two days with somewhat decreased pain and decreased swelling.  However, the pain does limit her left shoulder range of motion.  Denies any drainage thus far.  Denies any fevers or chills.  Past Medical History: Past Medical History:  Diagnosis Date  . Abscess of left breast   . Abscess of left thigh   . Abscess of right breast   . Abscess of right thigh   . Abscess of the breast and nipple 03/02/2019  . Anxiety   . Axillary abscess 07/02/2016  . Biliary colic   . Diabetes mellitus without complication (HCC)   . GERD (gastroesophageal reflux disease)   . Leukocytosis 07/19/2019  . Migraines      Past Surgical History: Past Surgical History:  Procedure Laterality Date  . CHOLECYSTECTOMY    . ESOPHAGOGASTRODUODENOSCOPY (EGD) WITH PROPOFOL N/A 10/01/2019   Procedure: ESOPHAGOGASTRODUODENOSCOPY (EGD) WITH PROPOFOL;  Surgeon: Toney Reil, MD;  Location: [facility] ENDOSCOPY;  Service: Gastroenterology;  Laterality: N/A;  . I & D EXTREMITY Left 03/05/2019   Procedure: Irrigation and debridement;  Surgeon: Henrene Dodge, MD;  Location: [facility] ORS;  Service: General;  Laterality: Left;  . INCISION AND DRAINAGE     right breast x1 and left groin x1 and left axilla  . INCISION AND DRAINAGE ABSCESS Left 07/02/2016   Procedure: INCISION AND DRAINAGE  ABSCESS;  Surgeon: Lattie Haw, MD;  Location: [facility] ORS;  Service: General;  Laterality: Left;  . INCISION AND DRAINAGE ABSCESS Left 04/18/2017   Procedure: INCISION AND DRAINAGE ABSCESS-LEFT GROIN;  Surgeon: Ancil Linsey, MD;  Location: [facility] ORS;  Service: General;  Laterality: Left;  . INCISION AND DRAINAGE ABSCESS Right 05/21/2018   Procedure: INCISION AND DRAINAGE ABSCESS- RIGHT THIGH;  Surgeon: Henrene Dodge, MD;  Location: [facility] ORS;  Service: General;  Laterality: Right;  . IRRIGATION AND DEBRIDEMENT ABSCESS Right 04/09/2018   Procedure: IRRIGATION AND DEBRIDEMENT ABSCESS;  Surgeon: Henrene Dodge, MD;  Location: [facility] ORS;  Service: General;  Laterality: Right;  . IRRIGATION AND DEBRIDEMENT ABSCESS Left 05/21/2018   Procedure: IRRIGATION AND DEBRIDEMENT BREAST ABSCESS;  Surgeon: Henrene Dodge, MD;  Location: [facility] ORS;  Service: General;  Laterality: Left;  . IRRIGATION AND DEBRIDEMENT ABSCESS Right 01/06/2020   Procedure: MINOR INCISION AND DRAINAGE OF ABSCESS, right axillary;  Surgeon: Campbell Lerner, MD;  Location: [facility] ORS;  Service: General;  Laterality: Right;  . PILONIDAL CYST EXCISION  12/03/2014   Procedure: CYST EXCISION PILONIDAL EXTENSIVE;  Surgeon: Duwaine Maxin, MD;  Location: [facility] ORS;  Service: General;;    Home Medications: Prior to Admission medications   Medication Sig Start Date End Date Taking? Authorizing Provider  amphetamine-dextroamphetamine (ADDERALL XR) 20 MG 24 hr capsule Take 2 capsules in the morning 03/19/20  Yes Zena Amos, MD  B-D ULTRAFINE III SHORT PEN 31G X 8 MM MISC  07/28/19  Yes [provider]  buPROPion (WELLBUTRIN XL) 300 MG 24 hr tablet Take 1 tablet (300 mg total) by mouth every morning. 06/01/20  Yes Zena Amos, MD  ciprofloxacin (CIPRO) 500 MG tablet Take 1 tablet (500 mg total) by mouth 2 (two) times daily. With food 04/29/20  Yes McLean-Scocuzza, Pasty Spillers, MD  Clindamycin Phosphate foam  07/28/19  Yes [provider]  doxycycline (VIBRAMYCIN) 100 MG capsule Take 1 capsule (100 mg total) by mouth 2 (two) times daily for 10 days. 08/17/20 08/27/20 Yes Cortnee Steinmiller, Elita Quick, MD  fluconazole (DIFLUCAN) 150 MG tablet Take 1 tablet (150 mg total) by mouth daily. 08/17/20  Yes Railee Bonillas, Elita Quick, MD  Insulin Degludec (TRESIBA) 100 UNIT/ML SOLN Inject 20 Units into the skin daily.    Yes [provider]  Lancets (ONETOUCH DELICA PLUS [place]) MISC  07/28/19  Yes [provider]  LORazepam (ATIVAN) 0.5 MG tablet Take 1 tablet (0.5 mg total) by mouth 2 (two) times daily as needed for anxiety. 06/01/20  Yes Zena Amos, MD  metFORMIN (GLUCOPHAGE-XR) 500 MG 24 hr tablet Take 500 mg by mouth in the morning, at noon, and at bedtime.  07/03/19  Yes [provider]  omeprazole (PRILOSEC) 40 MG capsule Take 1 capsule (40 mg total) by mouth daily. 30 minutes before food 12/30/19  Yes McLean-Scocuzza, Pasty Spillers, MD  [facility] ULTRA test strip  07/28/19  Yes [provider]  Semaglutide,0.25 or 0.5MG /DOS, (OZEMPIC, 0.25 OR 0.5 MG/DOSE,) 2 MG/1.5ML SOPN Inject 1 mg into the skin once a week.    Yes [provider]  norelgestromin-ethinyl estradiol Burr Medico) 150-35 MCG/24HR transdermal patch Place 1 patch onto the skin once a week. xulane patch Patient not taking: Reported on 08/17/2020    [provider]  ondansetron (ZOFRAN) 4 MG tablet Take 1 tablet (4 mg total) by mouth every 8 (eight) hours as needed for nausea or vomiting. Patient not taking: Reported on 08/17/2020 07/06/19   Huston Foley, MD    Allergies: Allergies  Allergen Reactions  . Sulfa Antibiotics Hives    Review of Systems: ROS  Physical Exam BP (!) 146/84   Pulse (!) 116   Temp 98.7 F (37.1 C) (Oral)   Ht 5\' 2"  (1.575 m)   Wt 179 lb (81.2 kg)   SpO2 98%   BMI 32.74 kg/m  CONSTITUTIONAL: No acute distress HEENT:  Normocephalic, atraumatic, extraocular motion intact. RESPIRATORY:  Normal respiratory effort without  pathologic use of accessory muscles. CARDIOVASCULAR:  Regular rhythm and rate. SKIN:  Left axilla has an area of about 3 x 1 cm size that is firm.  There is no significant erythema and there is no drainage or skin ulceration.  This is tender to palpation. NEUROLOGIC:  Motor and sensation is grossly normal.  Cranial nerves are grossly intact. PSYCH:  Alert and oriented to person, place and time. Affect is normal.   Assessment and Plan: This is a 30 y.o. female with history of hydradenitis and recurrent abscess, with recurrent left axillary abscess.  --Discussed with the patient that currently the area is firm without fluctuance at the time.  May not have a true target for I&D.  Given that the improvement has not been as significant with Cipro, it may be that she needs a different antibiotic.  Will give her a prescription for Doxycycline, which has also worked for her in the past.  Discussed with the patient that if there is no improvement over the next 48 hrs,  to give [place] a call so we can re-evaluate her and assess if she needs I&D.  Otherwise, we'll follow up in one week.  Face-to-face time spent with the patient and care providers was 25 minutes, with more than 50% of the time spent counseling, educating, and coordinating care of the patient.     Howie Ill, MD  [facility]

## 2020-08-17 NOTE — Patient Instructions (Addendum)
Dr Aleen Campi discussed with patient that he would prescribed different Antibiotic treatment Doxycycline for 10 days. Patient advised to give our office a call if she notices the symptoms haven't improved by Friday.  Hidradenitis Suppurativa Hidradenitis suppurativa is a long-term (chronic) skin disease. It is similar to a severe form of acne, but it affects areas of the body where acne would be unusual, especially areas of the body where skin rubs against skin and becomes moist. These include:  Underarms.  Groin.  Genital area.  Buttocks.  Upper thighs.  Breasts. Hidradenitis suppurativa may start out as small lumps or pimples caused by blocked sweat glands or hair follicles. Pimples may develop into deep sores that break open (rupture) and drain pus. Over time, affected areas of skin may thicken and become scarred. This condition is rare and does not spread from person to person (non-contagious). What are the causes? The exact cause of this condition is not known. It may be related to:  Female and female hormones.  An overactive disease-fighting system (immune system). The immune system may over-react to blocked hair follicles or sweat glands and cause swelling and pus-filled sores. What increases the risk? You are more likely to develop this condition if you:  Are female.  Are 30-30 years old.  Have a family history of hidradenitis suppurativa.  Have a personal history of acne.  Are overweight.  Smoke.  Take the medicine lithium. What are the signs or symptoms? The first symptoms are usually painful bumps in the skin, similar to pimples. The condition may get worse over time (progress), or it may only cause mild symptoms. If the disease progresses, symptoms may include:  Skin bumps getting bigger and growing deeper into the skin.  Bumps rupturing and draining pus.  Itchy, infected skin.  Skin getting thicker and scarred.  Tunnels under the skin (fistulas) where pus  drains from a bump.  Pain during daily activities, such as pain during walking if your groin area is affected.  Emotional problems, such as stress or depression. This condition may affect your appearance and your ability or willingness to wear certain clothes or do certain activities. How is this diagnosed? This condition is diagnosed by a health care provider who specializes in skin diseases (dermatologist). You may be diagnosed based on:  Your symptoms and medical history.  A physical exam.  Testing a pus sample for infection.  Blood tests. How is this treated? Your treatment will depend on how severe your symptoms are. The same treatment will not work for everybody with this condition. You may need to try several treatments to find what works best for you. Treatment may include:  Cleaning and bandaging (dressing) your wounds as needed.  Lifestyle changes, such as new skin care routines.  Taking medicines, such as: ? Antibiotics. ? Acne medicines. ? Medicines to reduce the activity of the immune system. ? A diabetes medicine (metformin). ? Birth control pills, for women. ? Steroids to reduce swelling and pain.  Working with a mental health care provider, if you experience emotional distress due to this condition. If you have severe symptoms that do not get better with medicine, you may need surgery. Surgery may involve:  Using a laser to clear the skin and remove hair follicles.  Opening and draining deep sores.  Removing the areas of skin that are diseased and scarred. Follow these instructions at home: Medicines  Take over-the-counter and prescription medicines only as told by your health care provider.  If you were  prescribed an antibiotic medicine, take it as told by your health care provider. Do not stop taking the antibiotic even if your condition improves.   Skin care  If you have open wounds, cover them with a clean dressing as told by your health care  provider. Keep wounds clean by washing them gently with soap and water when you bathe.  Do not shave the areas where you get hidradenitis suppurativa.  Do not wear deodorant.  Wear loose-fitting clothes.  Try to avoid getting overheated or sweaty. If you get sweaty or wet, change into clean, dry clothes as soon as you can.  To help relieve pain and itchiness, cover sore areas with a warm, clean washcloth (warm compress) for 5-10 minutes as often as needed.  If told by your health care provider, take a bleach bath twice a week: ? Fill your bathtub halfway with water. ? Pour in  cup of unscented household bleach. ? Soak in the tub for 5-10 minutes. ? Only soak from the neck down. Avoid water on your face and hair. ? Shower to rinse off the bleach from your skin. General instructions  Learn as much as you can about your disease so that you have an active role in your treatment. Work closely with your health care provider to find treatments that work for you.  If you are overweight, work with your health care provider to lose weight as recommended.  Do not use any products that contain nicotine or tobacco, such as cigarettes and e-cigarettes. If you need help quitting, ask your health care provider.  If you struggle with living with this condition, talk with your health care provider or work with a mental health care provider as recommended.  Keep all follow-up visits as told by your health care provider. This is important. Where to find more information  Hidradenitis Suppurativa Foundation, Inc.: https://www.hs-foundation.org/  American Academy of Dermatology: InstantFinish[employer]fi Contact a health care provider if you have:  A flare-up of hidradenitis suppurativa.  A fever or chills.  Trouble controlling your symptoms at home.  Trouble doing your daily activities because of your symptoms.  Trouble dealing with emotional problems related to your  condition. Summary  Hidradenitis suppurativa is a long-term (chronic) skin disease. It is similar to a severe form of acne, but it affects areas of the body where acne would be unusual.  The first symptoms are usually painful bumps in the skin, similar to pimples. The condition may only cause mild symptoms, or it may get worse over time (progress).  If you have open wounds, cover them with a clean dressing as told by your health care provider. Keep wounds clean by washing them gently with soap and water when you bathe.  Besides skin care, treatment may include medicines, laser treatment, and surgery. This information is not intended to replace advice given to you by your health care provider. Make sure you discuss any questions you have with your health care provider. Document Revised: 05/10/2020 Document Reviewed: 05/10/2020 Elsevier Patient Education  2021 [employer].

## 2020-08-22 ENCOUNTER — Telehealth: Payer: Self-pay | Admitting: Surgery

## 2020-08-22 ENCOUNTER — Other Ambulatory Visit: Payer: Self-pay

## 2020-08-22 ENCOUNTER — Other Ambulatory Visit
Admission: RE | Admit: 2020-08-22 | Discharge: 2020-08-22 | Disposition: A | Discharge status: Home / Self Care | Payer: Managed Care, Other (non HMO) | Source: Ambulatory Visit | Attending: Surgery | Admitting: Surgery

## 2020-08-22 ENCOUNTER — Ambulatory Visit (INDEPENDENT_AMBULATORY_CARE_PROVIDER_SITE_OTHER): Payer: Managed Care, Other (non HMO) | Admitting: Surgery

## 2020-08-22 ENCOUNTER — Encounter: Payer: Self-pay | Admitting: Surgery

## 2020-08-22 VITALS — BP 148/92 | HR 108 | Temp 97.9°F | Ht 62.0 in | Wt 179.2 lb

## 2020-08-22 DIAGNOSIS — L02412 Cutaneous abscess of left axilla: Secondary | ICD-10-CM | POA: Diagnosis not present

## 2020-08-22 DIAGNOSIS — Z01812 Encounter for preprocedural laboratory examination: Secondary | ICD-10-CM | POA: Insufficient documentation

## 2020-08-22 DIAGNOSIS — Z20822 Contact with and (suspected) exposure to covid-19: Secondary | ICD-10-CM | POA: Insufficient documentation

## 2020-08-22 NOTE — Telephone Encounter (Signed)
Patient has been advised of Pre-Admission date/time, COVID Testing date and Surgery date.  Surgery Date: 08/23/20 Preadmission Testing Date: 08/23/20 (arrive 2 hours early) Covid Testing Date: 08/22/20 - patient advised to [facility] ([address] [facility]) between 8a-1p   This surgery scheduled for the following day.  Patient has been advised to arrive 2 hours early, patient voices understanding.

## 2020-08-22 NOTE — H&P (View-Only) (Signed)
08/22/2020  History of Present Illness: Dawn Ortiz is a 30 y.o. female presenting for follow up of left axillary abscess.  She was last seen on 08/17/20 at which time her antibiotic was changed to see if it would help better.  She reports that the area has gotten larger in size.  Currently does not have any drainage but she did have some drainage over weekend.  Denies any fevers, chills.  However, the area is significantly tender and limits her left shoulder range of motion.  Past Medical History: Past Medical History:  Diagnosis Date  . Abscess of left breast   . Abscess of left thigh   . Abscess of right breast   . Abscess of right thigh   . Abscess of the breast and nipple 03/02/2019  . Anxiety   . Axillary abscess 07/02/2016  . Biliary colic   . Diabetes mellitus without complication (HCC)   . GERD (gastroesophageal reflux disease)   . Leukocytosis 07/19/2019  . Migraines      Past Surgical History: Past Surgical History:  Procedure Laterality Date  . CHOLECYSTECTOMY    . ESOPHAGOGASTRODUODENOSCOPY (EGD) WITH PROPOFOL N/A 10/01/2019   Procedure: ESOPHAGOGASTRODUODENOSCOPY (EGD) WITH PROPOFOL;  Surgeon: Toney Reil, MD;  Location: [facility] ENDOSCOPY;  Service: Gastroenterology;  Laterality: N/A;  . I & D EXTREMITY Left 03/05/2019   Procedure: Irrigation and debridement;  Surgeon: Henrene Dodge, MD;  Location: [facility] ORS;  Service: General;  Laterality: Left;  . INCISION AND DRAINAGE     right breast x1 and left groin x1 and left axilla  . INCISION AND DRAINAGE ABSCESS Left 07/02/2016   Procedure: INCISION AND DRAINAGE ABSCESS;  Surgeon: Lattie Haw, MD;  Location: [facility] ORS;  Service: General;  Laterality: Left;  . INCISION AND DRAINAGE ABSCESS Left 04/18/2017   Procedure: INCISION AND DRAINAGE ABSCESS-LEFT GROIN;  Surgeon: Ancil Linsey, MD;  Location: [facility] ORS;  Service: General;  Laterality: Left;  . INCISION AND DRAINAGE ABSCESS Right 05/21/2018   Procedure: INCISION  AND DRAINAGE ABSCESS- RIGHT THIGH;  Surgeon: Henrene Dodge, MD;  Location: [facility] ORS;  Service: General;  Laterality: Right;  . IRRIGATION AND DEBRIDEMENT ABSCESS Right 04/09/2018   Procedure: IRRIGATION AND DEBRIDEMENT ABSCESS;  Surgeon: Henrene Dodge, MD;  Location: [facility] ORS;  Service: General;  Laterality: Right;  . IRRIGATION AND DEBRIDEMENT ABSCESS Left 05/21/2018   Procedure: IRRIGATION AND DEBRIDEMENT BREAST ABSCESS;  Surgeon: Henrene Dodge, MD;  Location: [facility] ORS;  Service: General;  Laterality: Left;  . IRRIGATION AND DEBRIDEMENT ABSCESS Right 01/06/2020   Procedure: MINOR INCISION AND DRAINAGE OF ABSCESS, right axillary;  Surgeon: Campbell Lerner, MD;  Location: [facility] ORS;  Service: General;  Laterality: Right;  . PILONIDAL CYST EXCISION  12/03/2014   Procedure: CYST EXCISION PILONIDAL EXTENSIVE;  Surgeon: Duwaine Maxin, MD;  Location: [facility] ORS;  Service: General;;    Home Medications: Prior to Admission medications   Medication Sig Start Date End Date Taking? Authorizing Provider  amphetamine-dextroamphetamine (ADDERALL XR) 20 MG 24 hr capsule Take 2 capsules in the morning 03/19/20  Yes Zena Amos, MD  B-D ULTRAFINE III SHORT PEN 31G X 8 MM MISC  07/28/19  Yes [provider]  buPROPion (WELLBUTRIN XL) 300 MG 24 hr tablet Take 1 tablet (300 mg total) by mouth every morning. 06/01/20  Yes Zena Amos, MD  Clindamycin Phosphate foam  07/28/19  Yes [provider]  doxycycline (VIBRAMYCIN) 100 MG capsule Take 1 capsule (100 mg total) by mouth 2 (two) times  daily for 10 days. 08/17/20 08/27/20 Yes Miron Marxen, MD  fluconazole (DIFLUCAN) 150 MG tablet Take 1 tablet (150 mg total) by mouth daily. 08/17/20  Yes Verlin Uher, MD  Insulin Degludec (TRESIBA) 100 UNIT/ML SOLN Inject 20 Units into the skin daily.    Yes [provider]  Lancets (ONETOUCH DELICA PLUS LANCET33G) MISC  07/28/19  Yes [provider]  LORazepam (ATIVAN) 0.5 MG tablet Take 1  tablet (0.5 mg total) by mouth 2 (two) times daily as needed for anxiety. 06/01/20  Yes Kaur, Mandeep, MD  metFORMIN (GLUCOPHAGE-XR) 500 MG 24 hr tablet Take 500 mg by mouth in the morning, at noon, and at bedtime.  07/03/19  Yes [provider]  omeprazole (PRILOSEC) 40 MG capsule Take 1 capsule (40 mg total) by mouth daily. 30 minutes before food 12/30/19  Yes McLean-Scocuzza, Tracy N, MD  ONETOUCH ULTRA test strip  07/28/19  Yes [provider]  Semaglutide,0.25 or 0.5MG/DOS, (OZEMPIC, 0.25 OR 0.5 MG/DOSE,) 2 MG/1.5ML SOPN Inject 1 mg into the skin once a week.    Yes [provider]  ciprofloxacin (CIPRO) 500 MG tablet Take 1 tablet (500 mg total) by mouth 2 (two) times daily. With food Patient not taking: Reported on 08/22/2020 04/29/20   McLean-Scocuzza, Tracy N, MD  norelgestromin-ethinyl estradiol (XULANE) 150-35 MCG/24HR transdermal patch Place 1 patch onto the skin once a week. xulane patch Patient not taking: No sig reported    [provider]  ondansetron (ZOFRAN) 4 MG tablet Take 1 tablet (4 mg total) by mouth every 8 (eight) hours as needed for nausea or vomiting. Patient not taking: No sig reported 07/06/19   Athar, Saima, MD    Allergies: Allergies  Allergen Reactions  . Sulfa Antibiotics Hives    Review of Systems: Review of Systems  Constitutional: Negative for chills and fever.  Respiratory: Negative for shortness of breath.   Cardiovascular: Negative for chest pain.  Gastrointestinal: Negative for abdominal pain, nausea and vomiting.  Skin:       Left axillary abscess, expanding    Physical Exam BP (!) 148/92   Pulse (!) 108   Temp 97.9 F (36.6 C) (Oral)   Ht 5' 2" (1.575 m)   Wt 179 lb 3.2 oz (81.3 kg)   SpO2 99%   BMI 32.78 kg/m  CONSTITUTIONAL: No acute distress HEENT:  Normocephalic, atraumatic, extraocular motion intact. RESPIRATORY:  Lungs are clear, and breath sounds are equal bilaterally. Normal respiratory effort  without pathologic use of accessory muscles. CARDIOVASCULAR: Heart is regular without murmurs, gallops, or rubs. GI: The abdomen is soft, obese, non-distended.  SKIN:  Left axilla with enlarging abscess, currently no spontaneous drainage.  Has palpable fluctuance extending from anterior to posterior axillary line.   MSK:  The left arm and shoulder range of motion is limited due to pain. NEUROLOGIC:  Motor and sensation is grossly normal.  Cranial nerves are grossly intact. PSYCH:  Alert and oriented to person, place and time. Affect is normal.   Assessment and Plan: This is a 29 y.o. female with worsening left axillary abscess.  --The patient has a history of hydradenitis with prior abscesses requiring I&D.  All but one of these procedures has had to be done in the OR under anesthesia due to patient being unable to tolerate office procedures.  Today, she also prefers to do I&D in the OR. --Given timing, I discussed with her two options moving forwards.  One would be to schedule this case tomorrow   morning as outpatient with potential overnight admission depending on how severe the abscess cavity appears to be.  She would go get tested for COVID right now.  The other option would be to send her to the ED for admission, so she can have pain control and gets started on IV antibiotics today, and then add her to the schedule for tomorrow for I&D.  She prefers to do this as outpatient rather than having to go through the whole process of admission through the ER. --Discussed with her the plan for I&D of left axillary abscess.  Given the apparent size of the cavity, will likely do counter incisions and have penrose drains in place.  Discussed with her the risks of bleeding, infection, and injury to surrounding structures, and potential need for further procedures.  She's willing to proceed.  Will schedule her for tomorrow.  Face-to-face time spent with the patient and care providers was 25 minutes, with more  than 50% of the time spent counseling, educating, and coordinating care of the patient.     Howie Ill, MD [facility]

## 2020-08-22 NOTE — Progress Notes (Signed)
08/22/2020  History of Present Illness: Dawn Ortiz is a 30 y.o. female presenting for follow up of left axillary abscess.  She was last seen on 08/17/20 at which time her antibiotic was changed to see if it would help better.  She reports that the area has gotten larger in size.  Currently does not have any drainage but she did have some drainage over weekend.  Denies any fevers, chills.  However, the area is significantly tender and limits her left shoulder range of motion.  Past Medical History: Past Medical History:  Diagnosis Date  . Abscess of left breast   . Abscess of left thigh   . Abscess of right breast   . Abscess of right thigh   . Abscess of the breast and nipple 03/02/2019  . Anxiety   . Axillary abscess 07/02/2016  . Biliary colic   . Diabetes mellitus without complication (HCC)   . GERD (gastroesophageal reflux disease)   . Leukocytosis 07/19/2019  . Migraines      Past Surgical History: Past Surgical History:  Procedure Laterality Date  . CHOLECYSTECTOMY    . ESOPHAGOGASTRODUODENOSCOPY (EGD) WITH PROPOFOL N/A 10/01/2019   Procedure: ESOPHAGOGASTRODUODENOSCOPY (EGD) WITH PROPOFOL;  Surgeon: Toney Reil, MD;  Location: [facility] ENDOSCOPY;  Service: Gastroenterology;  Laterality: N/A;  . I & D EXTREMITY Left 03/05/2019   Procedure: Irrigation and debridement;  Surgeon: Henrene Dodge, MD;  Location: [facility] ORS;  Service: General;  Laterality: Left;  . INCISION AND DRAINAGE     right breast x1 and left groin x1 and left axilla  . INCISION AND DRAINAGE ABSCESS Left 07/02/2016   Procedure: INCISION AND DRAINAGE ABSCESS;  Surgeon: Lattie Haw, MD;  Location: [facility] ORS;  Service: General;  Laterality: Left;  . INCISION AND DRAINAGE ABSCESS Left 04/18/2017   Procedure: INCISION AND DRAINAGE ABSCESS-LEFT GROIN;  Surgeon: Ancil Linsey, MD;  Location: [facility] ORS;  Service: General;  Laterality: Left;  . INCISION AND DRAINAGE ABSCESS Right 05/21/2018   Procedure: INCISION  AND DRAINAGE ABSCESS- RIGHT THIGH;  Surgeon: Henrene Dodge, MD;  Location: [facility] ORS;  Service: General;  Laterality: Right;  . IRRIGATION AND DEBRIDEMENT ABSCESS Right 04/09/2018   Procedure: IRRIGATION AND DEBRIDEMENT ABSCESS;  Surgeon: Henrene Dodge, MD;  Location: [facility] ORS;  Service: General;  Laterality: Right;  . IRRIGATION AND DEBRIDEMENT ABSCESS Left 05/21/2018   Procedure: IRRIGATION AND DEBRIDEMENT BREAST ABSCESS;  Surgeon: Henrene Dodge, MD;  Location: [facility] ORS;  Service: General;  Laterality: Left;  . IRRIGATION AND DEBRIDEMENT ABSCESS Right 01/06/2020   Procedure: MINOR INCISION AND DRAINAGE OF ABSCESS, right axillary;  Surgeon: Campbell Lerner, MD;  Location: [facility] ORS;  Service: General;  Laterality: Right;  . PILONIDAL CYST EXCISION  12/03/2014   Procedure: CYST EXCISION PILONIDAL EXTENSIVE;  Surgeon: Duwaine Maxin, MD;  Location: [facility] ORS;  Service: General;;    Home Medications: Prior to Admission medications   Medication Sig Start Date End Date Taking? Authorizing Provider  amphetamine-dextroamphetamine (ADDERALL XR) 20 MG 24 hr capsule Take 2 capsules in the morning 03/19/20  Yes Zena Amos, MD  B-D ULTRAFINE III SHORT PEN 31G X 8 MM MISC  07/28/19  Yes [provider]  buPROPion (WELLBUTRIN XL) 300 MG 24 hr tablet Take 1 tablet (300 mg total) by mouth every morning. 06/01/20  Yes Zena Amos, MD  Clindamycin Phosphate foam  07/28/19  Yes [provider]  doxycycline (VIBRAMYCIN) 100 MG capsule Take 1 capsule (100 mg total) by mouth 2 (two) times  daily for 10 days. 08/17/20 08/27/20 Yes Kayah Hecker, Elita Quick, MD  fluconazole (DIFLUCAN) 150 MG tablet Take 1 tablet (150 mg total) by mouth daily. 08/17/20  Yes Itzayana Pardy, Elita Quick, MD  Insulin Degludec (TRESIBA) 100 UNIT/ML SOLN Inject 20 Units into the skin daily.    Yes [provider]  Lancets (ONETOUCH DELICA PLUS [place]) MISC  07/28/19  Yes [provider]  LORazepam (ATIVAN) 0.5 MG tablet Take 1  tablet (0.5 mg total) by mouth 2 (two) times daily as needed for anxiety. 06/01/20  Yes Zena Amos, MD  metFORMIN (GLUCOPHAGE-XR) 500 MG 24 hr tablet Take 500 mg by mouth in the morning, at noon, and at bedtime.  07/03/19  Yes [provider]  omeprazole (PRILOSEC) 40 MG capsule Take 1 capsule (40 mg total) by mouth daily. 30 minutes before food 12/30/19  Yes McLean-Scocuzza, Pasty Spillers, MD  [facility] ULTRA test strip  07/28/19  Yes [provider]  Semaglutide,0.25 or 0.5MG /DOS, (OZEMPIC, 0.25 OR 0.5 MG/DOSE,) 2 MG/1.5ML SOPN Inject 1 mg into the skin once a week.    Yes [provider]  ciprofloxacin (CIPRO) 500 MG tablet Take 1 tablet (500 mg total) by mouth 2 (two) times daily. With food Patient not taking: Reported on 08/22/2020 04/29/20   McLean-Scocuzza, Pasty Spillers, MD  norelgestromin-ethinyl estradiol [facility]) 150-35 MCG/24HR transdermal patch Place 1 patch onto the skin once a week. xulane patch Patient not taking: No sig reported    [provider]  ondansetron (ZOFRAN) 4 MG tablet Take 1 tablet (4 mg total) by mouth every 8 (eight) hours as needed for nausea or vomiting. Patient not taking: No sig reported 07/06/19   Huston Foley, MD    Allergies: Allergies  Allergen Reactions  . Sulfa Antibiotics Hives    Review of Systems: Review of Systems  Constitutional: Negative for chills and fever.  Respiratory: Negative for shortness of breath.   Cardiovascular: Negative for chest pain.  Gastrointestinal: Negative for abdominal pain, nausea and vomiting.  Skin:       Left axillary abscess, expanding    Physical Exam BP (!) 148/92   Pulse (!) 108   Temp 97.9 F (36.6 C) (Oral)   Ht 5\' 2"  (1.575 m)   Wt 179 lb 3.2 oz (81.3 kg)   SpO2 99%   BMI 32.78 kg/m  CONSTITUTIONAL: No acute distress HEENT:  Normocephalic, atraumatic, extraocular motion intact. RESPIRATORY:  Lungs are clear, and breath sounds are equal bilaterally. Normal respiratory effort  without pathologic use of accessory muscles. CARDIOVASCULAR: Heart is regular without murmurs, gallops, or rubs. GI: The abdomen is soft, obese, non-distended.  SKIN:  Left axilla with enlarging abscess, currently no spontaneous drainage.  Has palpable fluctuance extending from anterior to posterior axillary line.   MSK:  The left arm and shoulder range of motion is limited due to pain. NEUROLOGIC:  Motor and sensation is grossly normal.  Cranial nerves are grossly intact. PSYCH:  Alert and oriented to person, place and time. Affect is normal.   Assessment and Plan: This is a 30 y.o. female with worsening left axillary abscess.  --The patient has a history of hydradenitis with prior abscesses requiring I&D.  All but one of these procedures has had to be done in the OR under anesthesia due to patient being unable to tolerate office procedures.  Today, she also prefers to do I&D in the OR. --Given timing, I discussed with her two options moving forwards.  One would be to schedule this case tomorrow  morning as outpatient with potential overnight admission depending on how severe the abscess cavity appears to be.  She would go get tested for COVID right now.  The other option would be to send her to the ED for admission, so she can have pain control and gets started on IV antibiotics today, and then add her to the schedule for tomorrow for I&D.  She prefers to do this as outpatient rather than having to go through the whole process of admission through the ER. --Discussed with her the plan for I&D of left axillary abscess.  Given the apparent size of the cavity, will likely do counter incisions and have penrose drains in place.  Discussed with her the risks of bleeding, infection, and injury to surrounding structures, and potential need for further procedures.  She's willing to proceed.  Will schedule her for tomorrow.  Face-to-face time spent with the patient and care providers was 25 minutes, with more  than 50% of the time spent counseling, educating, and coordinating care of the patient.     Howie Ill, MD [facility]

## 2020-08-22 NOTE — Patient Instructions (Addendum)
Our surgery scheduler Britta Mccreedy will contact you within the next few hours to discuss the preparation prior to surgery and also discuss the times to align with your schedule. Please have the BLUE sheet when she contacts you. If you have any questions or concerns, please do not hesitate to give our office a call.  Hidradenitis Suppurativa Hidradenitis suppurativa is a long-term (chronic) skin disease. It is similar to a severe form of acne, but it affects areas of the body where acne would be unusual, especially areas of the body where skin rubs against skin and becomes moist. These include:  Underarms.  Groin.  Genital area.  Buttocks.  Upper thighs.  Breasts. Hidradenitis suppurativa may start out as small lumps or pimples caused by blocked sweat glands or hair follicles. Pimples may develop into deep sores that break open (rupture) and drain pus. Over time, affected areas of skin may thicken and become scarred. This condition is rare and does not spread from person to person (non-contagious). What are the causes? The exact cause of this condition is not known. It may be related to:  Female and female hormones.  An overactive disease-fighting system (immune system). The immune system may over-react to blocked hair follicles or sweat glands and cause swelling and pus-filled sores. What increases the risk? You are more likely to develop this condition if you:  Are female.  Are 72-39 years old.  Have a family history of hidradenitis suppurativa.  Have a personal history of acne.  Are overweight.  Smoke.  Take the medicine lithium. What are the signs or symptoms? The first symptoms are usually painful bumps in the skin, similar to pimples. The condition may get worse over time (progress), or it may only cause mild symptoms. If the disease progresses, symptoms may include:  Skin bumps getting bigger and growing deeper into the skin.  Bumps rupturing and draining pus.  Itchy,  infected skin.  Skin getting thicker and scarred.  Tunnels under the skin (fistulas) where pus drains from a bump.  Pain during daily activities, such as pain during walking if your groin area is affected.  Emotional problems, such as stress or depression. This condition may affect your appearance and your ability or willingness to wear certain clothes or do certain activities. How is this diagnosed? This condition is diagnosed by a health care provider who specializes in skin diseases (dermatologist). You may be diagnosed based on:  Your symptoms and medical history.  A physical exam.  Testing a pus sample for infection.  Blood tests. How is this treated? Your treatment will depend on how severe your symptoms are. The same treatment will not work for everybody with this condition. You may need to try several treatments to find what works best for you. Treatment may include:  Cleaning and bandaging (dressing) your wounds as needed.  Lifestyle changes, such as new skin care routines.  Taking medicines, such as: ? Antibiotics. ? Acne medicines. ? Medicines to reduce the activity of the immune system. ? A diabetes medicine (metformin). ? Birth control pills, for women. ? Steroids to reduce swelling and pain.  Working with a mental health care provider, if you experience emotional distress due to this condition. If you have severe symptoms that do not get better with medicine, you may need surgery. Surgery may involve:  Using a laser to clear the skin and remove hair follicles.  Opening and draining deep sores.  Removing the areas of skin that are diseased and scarred. Follow these  instructions at home: Medicines  Take over-the-counter and prescription medicines only as told by your health care provider.  If you were prescribed an antibiotic medicine, take it as told by your health care provider. Do not stop taking the antibiotic even if your condition improves.   Skin  care  If you have open wounds, cover them with a clean dressing as told by your health care provider. Keep wounds clean by washing them gently with soap and water when you bathe.  Do not shave the areas where you get hidradenitis suppurativa.  Do not wear deodorant.  Wear loose-fitting clothes.  Try to avoid getting overheated or sweaty. If you get sweaty or wet, change into clean, dry clothes as soon as you can.  To help relieve pain and itchiness, cover sore areas with a warm, clean washcloth (warm compress) for 5-10 minutes as often as needed.  If told by your health care provider, take a bleach bath twice a week: ? Fill your bathtub halfway with water. ? Pour in  cup of unscented household bleach. ? Soak in the tub for 5-10 minutes. ? Only soak from the neck down. Avoid water on your face and hair. ? Shower to rinse off the bleach from your skin. General instructions  Learn as much as you can about your disease so that you have an active role in your treatment. Work closely with your health care provider to find treatments that work for you.  If you are overweight, work with your health care provider to lose weight as recommended.  Do not use any products that contain nicotine or tobacco, such as cigarettes and e-cigarettes. If you need help quitting, ask your health care provider.  If you struggle with living with this condition, talk with your health care provider or work with a mental health care provider as recommended.  Keep all follow-up visits as told by your health care provider. This is important. Where to find more information  Hidradenitis Suppurativa Foundation, Inc.: https://www.hs-foundation.org/  American Academy of Dermatology: InstantFinish.fihttps://www.aad.org Contact a health care provider if you have:  A flare-up of hidradenitis suppurativa.  A fever or chills.  Trouble controlling your symptoms at home.  Trouble doing your daily activities because of your  symptoms.  Trouble dealing with emotional problems related to your condition. Summary  Hidradenitis suppurativa is a long-term (chronic) skin disease. It is similar to a severe form of acne, but it affects areas of the body where acne would be unusual.  The first symptoms are usually painful bumps in the skin, similar to pimples. The condition may only cause mild symptoms, or it may get worse over time (progress).  If you have open wounds, cover them with a clean dressing as told by your health care provider. Keep wounds clean by washing them gently with soap and water when you bathe.  Besides skin care, treatment may include medicines, laser treatment, and surgery. This information is not intended to replace advice given to you by your health care provider. Make sure you discuss any questions you have with your health care provider. Document Revised: 05/10/2020 Document Reviewed: 05/10/2020 Elsevier Patient Education  2021 [employer]vier Inc.  Wound Care, Adult Taking care of your wound properly can help to prevent pain, infection, and scarring. It can also help your wound heal more quickly. Follow instructions from your health care provider about how to care for your wound. Supplies needed:  Soap and water.  Wound cleanser.  Gauze.  If needed, a  clean bandage (dressing) or other type of wound dressing material to cover or place in the wound. Follow your health care provider's instructions about what dressing supplies to use.  Cream or ointment to apply to the wound, if told by your health care provider. How to care for your wound Cleaning the wound Ask your health care provider how to clean the wound. This may include:  Using mild soap and water or a wound cleanser.  Using a clean gauze to pat the wound dry after cleaning it. Do not rub or scrub the wound. Dressing care  Wash your hands with soap and water for at least 20 seconds before and after you change the dressing. If soap  and water are not available, use hand sanitizer.  Change your dressing as told by your health care provider. This may include: ? Cleaning or rinsing out (irrigating) the wound. ? Placing a dressing over the wound or in the wound (packing). ? Covering the wound with an outer dressing.  Leave any stitches (sutures), skin glue, or adhesive strips in place. These skin closures may need to stay in place for 2 weeks or longer. If adhesive strip edges start to loosen and curl up, you may trim the loose edges. Do not remove adhesive strips completely unless your health care provider tells you to do that.  Ask your health care provider when you can leave the wound uncovered. Checking for infection Check your wound area every day for signs of infection. Check for:  More redness, swelling, or pain.  Fluid or blood.  Warmth.  Pus or a bad smell.   Follow these instructions at home Medicines  If you were prescribed an antibiotic medicine, cream, or ointment, take or apply it as told by your health care provider. Do not stop using the antibiotic even if your condition improves.  If you were prescribed pain medicine, take it 30 minutes before you do any wound care or as told by your health care provider.  Take over-the-counter and prescription medicines only as told by your health care provider. Eating and drinking  Eat a diet that includes protein, vitamin A, vitamin C, and other nutrient-rich foods to help the wound heal. ? Foods rich in protein include meat, fish, eggs, dairy, beans, and nuts. ? Foods rich in vitamin A include carrots and dark green, leafy vegetables. ? Foods rich in vitamin C include citrus fruits, tomatoes, broccoli, and peppers.  Drink enough fluid to keep your urine pale yellow. General instructions  Do not take baths, swim, use a hot tub, or do anything that would put the wound underwater until your health care provider approves. Ask your health care provider if you  may take showers. You may only be allowed to take sponge baths.  Do not scratch or pick at the wound. Keep it covered as told by your health care provider.  Return to your normal activities as told by your health care provider. Ask your health care provider what activities are safe for you.  Protect your wound from the sun when you are outside for the first 6 months, or for as long as told by your health care provider. Cover up the scar area or apply sunscreen that has an SPF of at least 30.  Do not use any products that contain nicotine or tobacco, such as cigarettes, e-cigarettes, and chewing tobacco. These may delay wound healing. If you need help quitting, ask your health care provider.  Keep all follow-up visits as  told by your health care provider. This is important. Contact a health care provider if:  You received a tetanus shot and you have swelling, severe pain, redness, or bleeding at the injection site.  Your pain is not controlled with medicine.  You have any of these signs of infection: ? More redness, swelling, or pain around the wound. ? Fluid or blood coming from the wound. ? Warmth coming from the wound. ? Pus or a bad smell coming from the wound. ? A fever or chills.  You are nauseous or you vomit.  You are dizzy. Get help right away if:  You have a red streak of skin near the area around your wound.  Your wound has been closed with staples, sutures, skin glue, or adhesive strips and it begins to open up and separate.  Your wound is bleeding, and the bleeding does not stop with gentle pressure.  You have a rash.  You faint.  You have trouble breathing. These symptoms may represent a serious problem that is an emergency. Do not wait to see if the symptoms will go away. Get medical help right away. Call your local emergency services (911 in the [place] Do not drive yourself to the hospital. Summary  Always wash your hands with soap and water for at least 20  seconds before and after changing your dressing.  Change your dressing as told by your health care provider.  To help with healing, eat foods that are rich in protein, vitamin A, vitamin C, and other nutrients.  Check your wound every day for signs of infection. Contact your health care provider if you suspect that your wound is infected. This information is not intended to replace advice given to you by your health care provider. Make sure you discuss any questions you have with your health care provider. Document Revised: 05/01/2019 Document Reviewed: 05/01/2019 Elsevier Patient Education  2021 [employer].

## 2020-08-23 ENCOUNTER — Ambulatory Visit
Admission: RE | Admit: 2020-08-23 | Discharge: 2020-08-23 | Disposition: A | Discharge status: Home / Self Care | Payer: Managed Care, Other (non HMO) | Attending: Surgery | Admitting: Surgery

## 2020-08-23 ENCOUNTER — Ambulatory Visit: Payer: Managed Care, Other (non HMO) | Admitting: Anesthesiology

## 2020-08-23 ENCOUNTER — Encounter: Payer: Self-pay | Admitting: Surgery

## 2020-08-23 ENCOUNTER — Encounter
Admission: RE | Disposition: A | Discharge status: Home / Self Care | Payer: Self-pay | Source: Home / Self Care | Attending: Surgery

## 2020-08-23 DIAGNOSIS — Z7984 Long term (current) use of oral hypoglycemic drugs: Secondary | ICD-10-CM | POA: Insufficient documentation

## 2020-08-23 DIAGNOSIS — Z882 Allergy status to sulfonamides status: Secondary | ICD-10-CM | POA: Diagnosis not present

## 2020-08-23 DIAGNOSIS — Z794 Long term (current) use of insulin: Secondary | ICD-10-CM | POA: Insufficient documentation

## 2020-08-23 DIAGNOSIS — L732 Hidradenitis suppurativa: Secondary | ICD-10-CM | POA: Diagnosis not present

## 2020-08-23 DIAGNOSIS — Z79899 Other long term (current) drug therapy: Secondary | ICD-10-CM | POA: Insufficient documentation

## 2020-08-23 DIAGNOSIS — Z01810 Encounter for preprocedural cardiovascular examination: Secondary | ICD-10-CM | POA: Diagnosis not present

## 2020-08-23 DIAGNOSIS — B379 Candidiasis, unspecified: Secondary | ICD-10-CM

## 2020-08-23 DIAGNOSIS — L02412 Cutaneous abscess of left axilla: Secondary | ICD-10-CM | POA: Diagnosis not present

## 2020-08-23 DIAGNOSIS — B9689 Other specified bacterial agents as the cause of diseases classified elsewhere: Secondary | ICD-10-CM | POA: Diagnosis not present

## 2020-08-23 HISTORY — PX: INCISION AND DRAINAGE ABSCESS: SHX5864

## 2020-08-23 LAB — BASIC METABOLIC PANEL
Anion gap: 10 (ref 5–15)
BUN: 14 mg/dL (ref 6–20)
CO2: 25 mmol/L (ref 22–32)
Calcium: 9.1 mg/dL (ref 8.9–10.3)
Chloride: 103 mmol/L (ref 98–111)
Creatinine, Ser: 0.8 mg/dL (ref 0.44–1.00)
GFR, Estimated: >60 mL/min (ref >60)
Glucose, Bld: 182 mg/dL — ABNORMAL HIGH (ref 70–99)
Potassium: 3.8 mmol/L (ref 3.5–5.1)
Sodium: 138 mmol/L (ref 135–145)

## 2020-08-23 LAB — CBC
HCT: 31.2 % — ABNORMAL LOW (ref 36.0–46.0)
Hemoglobin: 10.3 g/dL — ABNORMAL LOW (ref 12.0–15.0)
MCH: 26.3 pg (ref 26.0–34.0)
MCHC: 33 g/dL (ref 30.0–36.0)
MCV: 79.8 fL — ABNORMAL LOW (ref 80.0–100.0)
Platelets: 320 10*3/uL (ref 150–400)
RBC: 3.91 MIL/uL (ref 3.87–5.11)
RDW: 13.3 % (ref 11.5–15.5)
WBC: 12.7 10*3/uL — ABNORMAL HIGH (ref 4.0–10.5)
nRBC: 0 % (ref 0.0–0.2)

## 2020-08-23 LAB — POCT PREGNANCY, URINE: Preg Test, Ur: NEGATIVE

## 2020-08-23 LAB — GLUCOSE, CAPILLARY
Glucose-Capillary: 155 mg/dL — ABNORMAL HIGH (ref 70–99)
Glucose-Capillary: 161 mg/dL — ABNORMAL HIGH (ref 70–99)

## 2020-08-23 LAB — SARS CORONAVIRUS 2 (TAT 6-24 HRS): SARS Coronavirus 2: NEGATIVE

## 2020-08-23 SURGERY — INCISION AND DRAINAGE, ABSCESS
Anesthesia: General | Laterality: Left

## 2020-08-23 MED ORDER — FENTANYL CITRATE (PF) 100 MCG/2ML IJ SOLN
25.0000 ug | INTRAMUSCULAR | Status: DC | PRN
  Administered 2020-08-23: 50 ug via INTRAVENOUS

## 2020-08-23 MED ORDER — ACETAMINOPHEN 500 MG PO TABS
1000.0000 mg | ORAL_TABLET | ORAL | Status: AC
  Administered 2020-08-23: 1000 mg via ORAL

## 2020-08-23 MED ORDER — METRONIDAZOLE IN NACL 5-0.79 MG/ML-% IV SOLN
500.0000 mg | Freq: Three times a day (TID) | INTRAVENOUS | Status: DC
  Administered 2020-08-23: 500 mg via INTRAVENOUS
  Filled 2020-08-23 (×5): qty 100

## 2020-08-23 MED ORDER — LACTATED RINGERS IV SOLN: INTRAVENOUS | Status: DC | PRN

## 2020-08-23 MED ORDER — FLUCONAZOLE 150 MG PO TABS: 150.0000 mg | ORAL_TABLET | Freq: Every day | ORAL | 0 refills | Status: DC

## 2020-08-23 MED ORDER — PROPOFOL 10 MG/ML IV BOLUS
INTRAVENOUS | Status: DC | PRN
  Administered 2020-08-23: 200 mg via INTRAVENOUS

## 2020-08-23 MED ORDER — CHLORHEXIDINE GLUCONATE CLOTH 2 % EX PADS
6.0000 | MEDICATED_PAD | Freq: Once | CUTANEOUS | Status: AC
  Administered 2020-08-23: 6 via TOPICAL

## 2020-08-23 MED ORDER — ORAL CARE MOUTH RINSE: 15.0000 mL | Freq: Once | OROMUCOSAL | Status: AC

## 2020-08-23 MED ORDER — OXYCODONE HCL 5 MG PO TABS
ORAL_TABLET | ORAL | Status: AC
  Administered 2020-08-23: 5 mg via ORAL
  Filled 2020-08-23: qty 1

## 2020-08-23 MED ORDER — GABAPENTIN 300 MG PO CAPS
300.0000 mg | ORAL_CAPSULE | ORAL | Status: AC
  Administered 2020-08-23: 300 mg via ORAL

## 2020-08-23 MED ORDER — DOXYCYCLINE HYCLATE 100 MG PO CAPS: 100.0000 mg | ORAL_CAPSULE | Freq: Two times a day (BID) | ORAL | 0 refills | Status: AC

## 2020-08-23 MED ORDER — LIDOCAINE HCL (CARDIAC) PF 100 MG/5ML IV SOSY
PREFILLED_SYRINGE | INTRAVENOUS | Status: DC | PRN
  Administered 2020-08-23: 100 mg via INTRAVENOUS

## 2020-08-23 MED ORDER — ONDANSETRON HCL 4 MG/2ML IJ SOLN
INTRAMUSCULAR | Status: AC
  Filled 2020-08-23: qty 2

## 2020-08-23 MED ORDER — MIDAZOLAM HCL 2 MG/2ML IJ SOLN
INTRAMUSCULAR | Status: AC
  Filled 2020-08-23: qty 2

## 2020-08-23 MED ORDER — CHLORHEXIDINE GLUCONATE 0.12 % MT SOLN
OROMUCOSAL | Status: AC
  Filled 2020-08-23: qty 15

## 2020-08-23 MED ORDER — OXYCODONE HCL 5 MG PO TABS: 5.0000 mg | ORAL_TABLET | ORAL | 0 refills | Status: DC | PRN

## 2020-08-23 MED ORDER — OXYCODONE HCL 5 MG/5ML PO SOLN: 5.0000 mg | Freq: Once | ORAL | Status: AC | PRN

## 2020-08-23 MED ORDER — ONDANSETRON HCL 4 MG/2ML IJ SOLN: 4.0000 mg | Freq: Once | INTRAMUSCULAR | Status: DC | PRN

## 2020-08-23 MED ORDER — FENTANYL CITRATE (PF) 100 MCG/2ML IJ SOLN
INTRAMUSCULAR | Status: DC | PRN
  Administered 2020-08-23 (×2): 50 ug via INTRAVENOUS

## 2020-08-23 MED ORDER — BUPIVACAINE LIPOSOME 1.3 % IJ SUSP: 20.0000 mL | Freq: Once | INTRAMUSCULAR | Status: DC

## 2020-08-23 MED ORDER — GABAPENTIN 300 MG PO CAPS
ORAL_CAPSULE | ORAL | Status: AC
  Filled 2020-08-23: qty 1

## 2020-08-23 MED ORDER — FENTANYL CITRATE (PF) 100 MCG/2ML IJ SOLN
INTRAMUSCULAR | Status: AC
  Administered 2020-08-23: 50 ug via INTRAVENOUS
  Filled 2020-08-23: qty 2

## 2020-08-23 MED ORDER — MIDAZOLAM HCL 2 MG/2ML IJ SOLN
INTRAMUSCULAR | Status: DC | PRN
  Administered 2020-08-23: 2 mg via INTRAVENOUS

## 2020-08-23 MED ORDER — SODIUM CHLORIDE 0.9 % IV SOLN
2.0000 g | INTRAVENOUS | Status: AC
  Administered 2020-08-23: 2 g via INTRAVENOUS
  Filled 2020-08-23: qty 2

## 2020-08-23 MED ORDER — LIDOCAINE HCL (PF) 2 % IJ SOLN
INTRAMUSCULAR | Status: AC
  Filled 2020-08-23: qty 5

## 2020-08-23 MED ORDER — OXYCODONE HCL 5 MG PO TABS: 5.0000 mg | ORAL_TABLET | Freq: Once | ORAL | Status: AC | PRN

## 2020-08-23 MED ORDER — PROPOFOL 10 MG/ML IV BOLUS
INTRAVENOUS | Status: AC
  Filled 2020-08-23: qty 20

## 2020-08-23 MED ORDER — SODIUM CHLORIDE 0.9 % IV SOLN
INTRAVENOUS | Status: DC | PRN
  Administered 2020-08-23: 50 mL

## 2020-08-23 MED ORDER — DAKINS (1/4 STRENGTH) 0.125 % EX SOLN
CUTANEOUS | Status: AC
  Filled 2020-08-23: qty 473

## 2020-08-23 MED ORDER — ACETAMINOPHEN 500 MG PO TABS
ORAL_TABLET | ORAL | Status: AC
  Filled 2020-08-23: qty 2

## 2020-08-23 MED ORDER — FENTANYL CITRATE (PF) 100 MCG/2ML IJ SOLN
INTRAMUSCULAR | Status: AC
  Filled 2020-08-23: qty 2

## 2020-08-23 MED ORDER — SODIUM CHLORIDE 0.9 % IV SOLN: INTRAVENOUS | Status: DC

## 2020-08-23 MED ORDER — DEXAMETHASONE SODIUM PHOSPHATE 10 MG/ML IJ SOLN
INTRAMUSCULAR | Status: AC
  Filled 2020-08-23: qty 1

## 2020-08-23 MED ORDER — BUPIVACAINE HCL (PF) 0.5 % IJ SOLN
INTRAMUSCULAR | Status: AC
  Filled 2020-08-23: qty 30

## 2020-08-23 MED ORDER — IBUPROFEN 800 MG PO TABS: 800.0000 mg | ORAL_TABLET | Freq: Three times a day (TID) | ORAL | 1 refills | Status: DC | PRN

## 2020-08-23 MED ORDER — CHLORHEXIDINE GLUCONATE 0.12 % MT SOLN
15.0000 mL | Freq: Once | OROMUCOSAL | Status: AC
  Administered 2020-08-23: 15 mL via OROMUCOSAL

## 2020-08-23 MED ORDER — FENTANYL CITRATE (PF) 250 MCG/5ML IJ SOLN
INTRAMUSCULAR | Status: AC
  Filled 2020-08-23: qty 5

## 2020-08-23 MED ORDER — BUPIVACAINE LIPOSOME 1.3 % IJ SUSP
INTRAMUSCULAR | Status: AC
  Filled 2020-08-23: qty 20

## 2020-08-23 MED ORDER — DEXAMETHASONE SODIUM PHOSPHATE 10 MG/ML IJ SOLN
INTRAMUSCULAR | Status: DC | PRN
  Administered 2020-08-23: 10 mg via INTRAVENOUS

## 2020-08-23 MED ORDER — EPINEPHRINE PF 1 MG/ML IJ SOLN
INTRAMUSCULAR | Status: AC
  Filled 2020-08-23: qty 1

## 2020-08-23 SURGICAL SUPPLY — 35 items
APL PRP STRL LF DISP 70% ISPRP (MISCELLANEOUS) ×1
BNDG GAUZE 4.5X4.1 6PLY STRL (MISCELLANEOUS) ×2 IMPLANT
CANISTER SUCT 1200ML W/VALVE (MISCELLANEOUS) ×2 IMPLANT
CHLORAPREP W/TINT 26 (MISCELLANEOUS) ×2 IMPLANT
CNTNR SPEC 2.5X3XGRAD LEK (MISCELLANEOUS) ×1
CONT SPEC 4OZ STER OR WHT (MISCELLANEOUS) ×1
CONT SPEC 4OZ STRL OR WHT (MISCELLANEOUS) ×1
CONTAINER SPEC 2.5X3XGRAD LEK (MISCELLANEOUS) ×1 IMPLANT
COVER WAND RF STERILE (DRAPES) ×2 IMPLANT
DRAIN PENROSE 1/4X12 LTX STRL (WOUND CARE) ×4 IMPLANT
DRAPE LAPAROTOMY 77X122 PED (DRAPES) ×2 IMPLANT
DRSG GAUZE FLUFF 36X18 (GAUZE/BANDAGES/DRESSINGS) ×2 IMPLANT
ELECT REM PT RETURN 9FT ADLT (ELECTROSURGICAL) ×2
ELECTRODE REM PT RTRN 9FT ADLT (ELECTROSURGICAL) ×1 IMPLANT
GAUZE SPONGE 4X4 12PLY STRL (GAUZE/BANDAGES/DRESSINGS) ×2 IMPLANT
GLOVE SURG SYN 7.0 (GLOVE) ×2 IMPLANT
GLOVE SURG SYN 7.5  E (GLOVE) ×1
GLOVE SURG SYN 7.5 E (GLOVE) ×1 IMPLANT
GOWN STRL REUS W/ TWL LRG LVL3 (GOWN DISPOSABLE) ×2 IMPLANT
GOWN STRL REUS W/TWL LRG LVL3 (GOWN DISPOSABLE) ×4
KIT TURNOVER KIT A (KITS) ×2 IMPLANT
LABEL OR SOLS (LABEL) ×2 IMPLANT
MANIFOLD NEPTUNE II (INSTRUMENTS) ×2 IMPLANT
NEEDLE HYPO 22GX1.5 SAFETY (NEEDLE) ×2 IMPLANT
NS IRRIG 500ML POUR BTL (IV SOLUTION) ×2 IMPLANT
PACK BASIN MINOR ARMC (MISCELLANEOUS) ×2 IMPLANT
PAD ABD DERMACEA PRESS 5X9 (GAUZE/BANDAGES/DRESSINGS) ×2 IMPLANT
SOL PREP PVP 2OZ (MISCELLANEOUS) ×2
SOLUTION PREP PVP 2OZ (MISCELLANEOUS) ×1 IMPLANT
SPONGE LAP 18X18 RF (DISPOSABLE) ×4 IMPLANT
SUT SILK 0 (SUTURE) ×2
SUT SILK 0 30XBRD TIE 6 (SUTURE) ×1 IMPLANT
SWAB CULTURE AMIES ANAERIB BLU (MISCELLANEOUS) ×2 IMPLANT
SYR 20ML LL LF (SYRINGE) ×2 IMPLANT
SYR BULB IRRIG 60ML STRL (SYRINGE) ×2 IMPLANT

## 2020-08-23 NOTE — Interval H&P Note (Signed)
History and Physical Interval Note:  08/23/2020 9:27 AM  Dawn Ortiz  has presented today for surgery, with the diagnosis of left axillary abscess.  The various methods of treatment have been discussed with the patient and family. After consideration of risks, benefits and other options for treatment, the patient has consented to  Procedure(s) with comments: INCISION AND DRAINAGE ABSCESS, axillary (Left) - as a surgical intervention.  The patient's history has been reviewed, patient examined, no change in status, stable for surgery.  I have reviewed the patient's chart and labs.  Questions were answered to the patient's satisfaction.     Roylee Chaffin

## 2020-08-23 NOTE — Transfer of Care (Signed)
Immediate Anesthesia Transfer of Care Note  Patient: Dawn Ortiz  Procedure(s) Performed: INCISION AND DRAINAGE ABSCESS, axillary (Left )  Patient Location: PACU  Anesthesia Type:General  Level of Consciousness: awake, alert  and oriented  Airway & Oxygen Therapy: Patient Spontanous Breathing and Patient connected to face mask oxygen  Post-op Assessment: Report given to RN and Post -op Vital signs reviewed and stable  Post vital signs: Reviewed and stable  Last Vitals:  Vitals Value Taken Time  BP 124/66 08/23/20 1058  Temp 36.1 C 08/23/20 1058  Pulse 114 08/23/20 1102  Resp 18 08/23/20 1102  SpO2 100 % 08/23/20 1102  Vitals shown include unvalidated device data.  Last Pain:  Vitals:   08/23/20 1058  TempSrc:   PainSc: Asleep         Complications: No complications documented.

## 2020-08-23 NOTE — Anesthesia Postprocedure Evaluation (Signed)
Anesthesia Post Note  Patient: Dawn Ortiz  Procedure(s) Performed: INCISION AND DRAINAGE ABSCESS, axillary (Left )  Patient location during evaluation: PACU Anesthesia Type: General Level of consciousness: awake and alert Pain management: pain level controlled Vital Signs Assessment: post-procedure vital signs reviewed and stable Respiratory status: spontaneous breathing, nonlabored ventilation, respiratory function stable and patient connected to nasal cannula oxygen Cardiovascular status: blood pressure returned to baseline and stable Postop Assessment: no apparent nausea or vomiting Anesthetic complications: no   No complications documented.   Last Vitals:  Vitals:   08/23/20 1157 08/23/20 1210  BP: 127/84 119/78  Pulse: (!) 106 (!) 109  Resp: 12 16  Temp: 37 C 36.9 C  SpO2: 96% 100%    Last Pain:  Vitals:   08/23/20 1210  TempSrc: Temporal  PainSc: 5                  Corinda Gubler

## 2020-08-23 NOTE — Discharge Instructions (Signed)
AMBULATORY SURGERY  DISCHARGE INSTRUCTIONS   1) The drugs that you were given will stay in your system until tomorrow so for the next 24 hours you should not:  A) Drive an automobile B) Make any legal decisions C) Drink any alcoholic beverage   2) You may resume regular meals tomorrow.  Today it is better to start with liquids and gradually work up to solid foods.  You may eat anything you prefer, but it is better to start with liquids, then soup and crackers, and gradually work up to solid foods.   3) Please notify your doctor immediately if you have any unusual bleeding, trouble breathing, redness and pain at the surgery site, drainage, fever, or pain not relieved by medication.    4) Additional Instructions: Do not remove green band for 4 days       Please contact your physician with any problems or Same Day Surgery at [phone], Monday through Friday 6 am to 4 pm, or [place] at [facility] number at [phone].

## 2020-08-23 NOTE — Anesthesia Procedure Notes (Signed)
Procedure Name: LMA Insertion Date/Time: 08/23/2020 10:03 AM Performed by: Iran Planas, CRNA Pre-anesthesia Checklist: Patient identified, Emergency Drugs available, Suction available, Patient being monitored and Timeout performed Patient Re-evaluated:Patient Re-evaluated prior to induction Oxygen Delivery Method: Circle system utilized Preoxygenation: Pre-oxygenation with 100% oxygen Induction Type: IV induction LMA: LMA inserted LMA Size: 4.5 Number of attempts: 1 Tube secured with: Tape Dental Injury: Teeth and Oropharynx as per pre-operative assessment

## 2020-08-23 NOTE — Anesthesia Preprocedure Evaluation (Signed)
Anesthesia Evaluation  Patient identified by MRN, date of birth, ID band Patient awake    Reviewed: Allergy & Precautions, H&P , NPO status , Patient's Chart, lab work & pertinent test results  History of Anesthesia Complications Negative for: history of anesthetic complications  Airway Mallampati: II  TM Distance: >3 FB Neck ROM: full    Dental  (+) Teeth Intact   Pulmonary neg sleep apnea, neg COPD, Not current smoker, former smoker,    Pulmonary exam normal breath sounds clear to auscultation       Cardiovascular Exercise Tolerance: Good hypertension, Pt. on medications (-) Past MI and (-) CHF Normal cardiovascular exam(-) dysrhythmias (-) Valvular Problems/Murmurs Rhythm:Regular Rate:Normal - Systolic murmurs    Neuro/Psych  Headaches, neg Seizures PSYCHIATRIC DISORDERS Anxiety Depression    GI/Hepatic Neg liver ROS, GERD  Medicated and Controlled,  Endo/Other  diabetes, Type 2, Insulin Dependent  Renal/GU negative Renal ROS     Musculoskeletal   Abdominal   Peds  Hematology negative hematology ROS (+)   Anesthesia Other Findings Past Medical History: No date: Abscess of left breast No date: Abscess of left thigh No date: Abscess of right breast No date: Abscess of right thigh 03/02/2019: Abscess of the breast and nipple No date: Anxiety 07/02/2016: Axillary abscess No date: Biliary colic No date: Diabetes mellitus without complication (HCC) No date: GERD (gastroesophageal reflux disease) 07/19/2019: Leukocytosis No date: Migraines  BMI    Body Mass Index: 30.42 kg/m    Past Surgical History: No date: CHOLECYSTECTOMY 10/01/2019: ESOPHAGOGASTRODUODENOSCOPY (EGD) WITH PROPOFOL; N/A     Comment:  Procedure: ESOPHAGOGASTRODUODENOSCOPY (EGD) WITH               PROPOFOL;  Surgeon: Toney Reil, MD;  Location:               [facility];  Service: Gastroenterology;  Laterality:                N/A; 03/05/2019: I & D EXTREMITY; Left     Comment:  Procedure: Irrigation and debridement;  Surgeon:               Henrene Dodge, MD;  Location: [facility];  Service:               General;  Laterality: Left; No date: INCISION AND DRAINAGE     Comment:  right breast x1 and left groin x1 and left axilla 07/02/2016: INCISION AND DRAINAGE ABSCESS; Left     Comment:  Procedure: INCISION AND DRAINAGE ABSCESS;  Surgeon:               Lattie Haw, MD;  Location: [facility] OR[facility];  Service:               General;  Laterality: Left; 04/18/2017: INCISION AND DRAINAGE ABSCESS; Left     Comment:  Procedure: INCISION AND DRAINAGE ABSCESS-LEFT GROIN;                Surgeon: Ancil Linsey, MD;  Location: [facility];                Service: General;  Laterality: Left; 05/21/2018: INCISION AND DRAINAGE ABSCESS; Right     Comment:  Procedure: INCISION AND DRAINAGE ABSCESS- RIGHT THIGH;                Surgeon: Henrene Dodge, MD;  Location: [facility];                Service: General;  Laterality: Right; 04/09/2018: IRRIGATION AND DEBRIDEMENT ABSCESS; Right     Comment:  Procedure: IRRIGATION AND DEBRIDEMENT ABSCESS;  Surgeon:              Henrene Dodge, MD;  Location: [facility];  Service:               General;  Laterality: Right; 05/21/2018: IRRIGATION AND DEBRIDEMENT ABSCESS; Left     Comment:  Procedure: IRRIGATION AND DEBRIDEMENT BREAST ABSCESS;                Surgeon: Henrene Dodge, MD;  Location: [facility];                Service: General;  Laterality: Left; 12/03/2014: PILONIDAL CYST EXCISION     Comment:  Procedure: CYST EXCISION PILONIDAL EXTENSIVE;  Surgeon:               Duwaine Maxin, MD;  Location: [facility];  Service:               General;;   Reproductive/Obstetrics negative OB ROS                             Anesthesia Physical  Anesthesia Plan  ASA: III  Anesthesia Plan: General   Post-op Pain Management:    Induction: Intravenous  PONV Risk Score and Plan: 3  and Propofol infusion, TIVA and Treatment may vary due to age or medical condition  Airway Management Planned: LMA  Additional Equipment:   Intra-op Plan:   Post-operative Plan: Extubation in OR  Informed Consent: I have reviewed the patients History and Physical, chart, labs and discussed the procedure including the risks, benefits and alternatives for the proposed anesthesia with the patient or authorized representative who has indicated his/her understanding and acceptance.     Dental Advisory Given  Plan Discussed with: Anesthesiologist, CRNA and Surgeon  Anesthesia Plan Comments: (Patient consented for risks of anesthesia including but not limited to:  - adverse reactions to medications - damage to eyes, teeth, lips or other oral mucosa - nerve damage due to positioning  - sore throat or hoarseness - Damage to heart, brain, nerves, lungs, other parts of body or loss of life  Patient voiced understanding.)        Anesthesia Quick Evaluation

## 2020-08-23 NOTE — Op Note (Signed)
  Procedure Date:  08/23/2020  Pre-operative Diagnosis:  Left axillary abscess  Post-operative Diagnosis:  Left axillary abscess  Procedure:  Incision and Drainage of Left axillary abscess  Surgeon:  Howie Ill, MD  Anesthesia:  General endotracheal  Estimated Blood Loss:  10 ml  Specimens:  Abscess culture  Complications:  None  Indications for Procedure:  This is a 30 y.o. female with diagnosis of a left axillary abscess, requiring drainage procedure after not improving with antibiotic management.  The risks of bleeding, abscess or infection, injury to surrounding structures, and need for further procedures were all discussed with the patient and was willing to proceed.  Description of Procedure: The patient was correctly identified in the preoperative area and brought into the operating room.  The patient was placed supine with VTE prophylaxis in place.  Appropriate time-outs were performed.  Anesthesia was induced and the patient was intubated.  Appropriate antibiotics were infused.  Ultrasound was used to evaluate the size of the abscess cavity, which measured about 10 cm x 8 cm.  Based on that, it was decided to do small incisions with penrose drains.  The patient's left axilla was prepped and draped in usual sterile fashion. A 2 cm incision was made over the most anterior and inferior portion of the abscess, revealing purulent fluid.  This fluid was swabbed for culture and sent to micro.  Yankauer was used to suction all the purulent fluid and break any loculations.  A 2 cm incision was made at the posterior inferior portion.  Then a 2 cm incision was made at the anterior superior portion and posterior superior portions, establishing 4 areas of drainage.  After drainage was completed, the cavity was irrigated and cleaned.  Two 1/4 inch penrose drains were looped between the two inferior incisions and the two superior incisions, tying them with 0 silk ties.  50 ml of Exparel  solution combined with 0.5% bupivacaine was infiltrated over thew ound and cavity.  The wound was cleaned and covered with dry gauze, ABD, and tape.  The patient tolerated the procedure well and all counts were correct at the end of the case.   Howie Ill, MD

## 2020-08-23 NOTE — Progress Notes (Signed)
Dr. Suzan Slick at bedside and reviewed EKG

## 2020-08-24 ENCOUNTER — Encounter: Payer: Self-pay | Admitting: Surgery

## 2020-08-24 ENCOUNTER — Ambulatory Visit: Payer: Managed Care, Other (non HMO) | Admitting: Surgery

## 2020-08-27 LAB — AEROBIC/ANAEROBIC CULTURE W GRAM STAIN (SURGICAL/DEEP WOUND)
Culture: NEGATIVE
Gram Stain: NONE SEEN

## 2020-08-30 ENCOUNTER — Telehealth (INDEPENDENT_AMBULATORY_CARE_PROVIDER_SITE_OTHER): Payer: 59 | Admitting: Psychiatry

## 2020-08-30 ENCOUNTER — Other Ambulatory Visit: Payer: Self-pay

## 2020-08-30 ENCOUNTER — Encounter (HOSPITAL_COMMUNITY): Payer: Self-pay | Admitting: Psychiatry

## 2020-08-30 DIAGNOSIS — F900 Attention-deficit hyperactivity disorder, predominantly inattentive type: Secondary | ICD-10-CM

## 2020-08-30 DIAGNOSIS — F418 Other specified anxiety disorders: Secondary | ICD-10-CM

## 2020-08-30 MED ORDER — LORAZEPAM 0.5 MG PO TABS: 0.5000 mg | ORAL_TABLET | Freq: Two times a day (BID) | ORAL | 1 refills | Status: DC | PRN

## 2020-08-30 MED ORDER — AMPHETAMINE-DEXTROAMPHET ER 20 MG PO CP24: ORAL_CAPSULE | ORAL | 0 refills | Status: DC

## 2020-08-30 MED ORDER — BUPROPION HCL ER (XL) 300 MG PO TB24: 300.0000 mg | ORAL_TABLET | ORAL | 0 refills | Status: DC

## 2020-08-30 NOTE — Progress Notes (Signed)
BH MD OP Progress Note  Virtual Visit via Video Note  I connected with Dawn Ortiz on 08/30/20 at  8:40 AM EST by a video enabled telemedicine application and verified that I am speaking with the correct person using two identifiers.  Location: Patient: Home Provider: Clinic   I discussed the limitations of evaluation and management by telemedicine and the availability of in person appointments. The patient expressed understanding and agreed to proceed.  I provided 13 minutes of non-face-to-face time during this encounter.   08/30/2020 8:58 AM Dawn Ortiz  MRN:  [id number]  Chief Complaint:  " I am doing well."   HPI: Patient reported that she is doing well.  She informed that her mood has been stable.  She also reported that her other health conditions including diabetes are well controlled for now.  She stated that she is looking forward to a lot of international traveling this year.  She is planning to visit her by next month and also has plans to visit [place] in the summer and then [place] in November.  She stated that she hopes that the Covid pandemic situation stabilizes so that she can travel without much fear. She stated that she has no concerns to report today and requested refills for all her medicines.  Visit Diagnosis:    ICD-10-CM   1. Attention deficit hyperactivity disorder (ADHD), predominantly inattentive type  F90.0 amphetamine-dextroamphetamine (ADDERALL XR) 20 MG 24 hr capsule    amphetamine-dextroamphetamine (ADDERALL XR) 20 MG 24 hr capsule    amphetamine-dextroamphetamine (ADDERALL XR) 20 MG 24 hr capsule  2. Depression with anxiety  F41.8 LORazepam (ATIVAN) 0.5 MG tablet    buPROPion (WELLBUTRIN XL) 300 MG 24 hr tablet    Past Psychiatric History: Depression, anxiety  Past Medical History:  Past Medical History:  Diagnosis Date  . Abscess of left breast   . Abscess of left thigh   . Abscess of right breast   . Abscess of right thigh   . Abscess of  the breast and nipple 03/02/2019  . Anxiety   . Axillary abscess 07/02/2016  . Biliary colic   . Diabetes mellitus without complication (HCC)   . GERD (gastroesophageal reflux disease)   . Leukocytosis 07/19/2019  . Migraines     Past Surgical History:  Procedure Laterality Date  . CHOLECYSTECTOMY    . ESOPHAGOGASTRODUODENOSCOPY (EGD) WITH PROPOFOL N/A 10/01/2019   Procedure: ESOPHAGOGASTRODUODENOSCOPY (EGD) WITH PROPOFOL;  Surgeon: Toney Reil, MD;  Location: [facility] ENDOSCOPY;  Service: Gastroenterology;  Laterality: N/A;  . I & D EXTREMITY Left 03/05/2019   Procedure: Irrigation and debridement;  Surgeon: Henrene Dodge, MD;  Location: [facility] ORS;  Service: General;  Laterality: Left;  . INCISION AND DRAINAGE     right breast x1 and left groin x1 and left axilla  . INCISION AND DRAINAGE ABSCESS Left 07/02/2016   Procedure: INCISION AND DRAINAGE ABSCESS;  Surgeon: Lattie Haw, MD;  Location: [facility] ORS;  Service: General;  Laterality: Left;  . INCISION AND DRAINAGE ABSCESS Left 04/18/2017   Procedure: INCISION AND DRAINAGE ABSCESS-LEFT GROIN;  Surgeon: Ancil Linsey, MD;  Location: [facility] ORS;  Service: General;  Laterality: Left;  . INCISION AND DRAINAGE ABSCESS Right 05/21/2018   Procedure: INCISION AND DRAINAGE ABSCESS- RIGHT THIGH;  Surgeon: Henrene Dodge, MD;  Location: [facility] ORS;  Service: General;  Laterality: Right;  . INCISION AND DRAINAGE ABSCESS Left 08/23/2020   Procedure: INCISION AND DRAINAGE ABSCESS, axillary;  Surgeon: Henrene Dodge, MD;  Location: [facility] ORS;  Service: General;  Laterality: Left;  provider requesting 2 hours /120 minutes for procedure  . IRRIGATION AND DEBRIDEMENT ABSCESS Right 04/09/2018   Procedure: IRRIGATION AND DEBRIDEMENT ABSCESS;  Surgeon: Henrene Dodge, MD;  Location: [facility] ORS;  Service: General;  Laterality: Right;  . IRRIGATION AND DEBRIDEMENT ABSCESS Left 05/21/2018   Procedure: IRRIGATION AND DEBRIDEMENT BREAST ABSCESS;  Surgeon: Henrene Dodge,  MD;  Location: [facility] ORS;  Service: General;  Laterality: Left;  . IRRIGATION AND DEBRIDEMENT ABSCESS Right 01/06/2020   Procedure: MINOR INCISION AND DRAINAGE OF ABSCESS, right axillary;  Surgeon: Campbell Lerner, MD;  Location: [facility] ORS;  Service: General;  Laterality: Right;  . PILONIDAL CYST EXCISION  12/03/2014   Procedure: CYST EXCISION PILONIDAL EXTENSIVE;  Surgeon: Duwaine Maxin, MD;  Location: [facility] ORS;  Service: General;;     Family History:  Family History  Problem Relation Age of Onset  . Healthy Mother   . Hypertension Father   . Diabetes Father   . Heart disease Father   . Sarcoidosis Father   . Healthy Brother   . Obesity Brother   . Cancer Paternal Grandfather        colon and also siblings had cancer ?type  . Hypertension Other     Social History:  Social History   Socioeconomic History  . Marital status: Single    Spouse name: Not on file  . Number of children: Not on file  . Years of education: Not on file  . Highest education level: Not on file  Occupational History  . Not on file  Tobacco Use  . Smoking status: Former Smoker    Types: Cigarettes  . Smokeless tobacco: Never Used  Vaping Use  . Vaping Use: Never used  Substance and Sexual Activity  . Alcohol use: Yes    Alcohol/week: 1.0 - 2.0 standard drink    Types: 1 - 2 Glasses of wine per week    Comment: none last 24hrs  . Drug use: No  . Sexual activity: Yes    Birth control/protection: Implant  Other Topics Concern  . Not on file  Social History Narrative   DPR mom Amberlea Spagnuolo [phone]    Dad Sherice Ijames [phone]    No kids    Works for [employer]   Social Determinants of Health   Financial Resource Strain: Not on file  Food Insecurity: Not on file  Transportation Needs: Not on file  Physical Activity: Not on file  Stress: Not on file  Social Connections: Not on file    Allergies:  Allergies  Allergen Reactions  . Sulfa Antibiotics Hives    Metabolic  Disorder Labs: Lab Results  Component Value Date   HGBA1C 8.9 (H) 07/19/2019   MPG 208.73 07/19/2019   No results found for: PROLACTIN Lab Results  Component Value Date   CHOL 127 02/26/2019   TRIG 96 02/26/2019   HDL 37 (L) 02/26/2019   CHOLHDL 3.4 02/26/2019   LDLCALC 71 02/26/2019   LDLCALC 94 10/21/2017   Lab Results  Component Value Date   TSH 0.773 02/26/2019   TSH 0.425 (L) 10/12/2016    Therapeutic Level Labs: No results found for: LITHIUM No results found for: VALPROATE No components found for:  CBMZ  Current Medications: Current Outpatient Medications  Medication Sig Dispense Refill  . amphetamine-dextroamphetamine (ADDERALL XR) 20 MG 24 hr capsule Take 2 capsules in the morning 60 capsule 0  . [START ON 09/27/2020] amphetamine-dextroamphetamine (ADDERALL XR)  20 MG 24 hr capsule Take 2 capsules in the morning 60 capsule 0  . [START ON 10/28/2020] amphetamine-dextroamphetamine (ADDERALL XR) 20 MG 24 hr capsule Take 2 capsules in the morning 60 capsule 0  . B-D ULTRAFINE III SHORT PEN 31G X 8 MM MISC     . buPROPion (WELLBUTRIN XL) 300 MG 24 hr tablet Take 1 tablet (300 mg total) by mouth every morning. 90 tablet 0  . Clindamycin Phosphate foam     . doxycycline (VIBRAMYCIN) 100 MG capsule Take 1 capsule (100 mg total) by mouth 2 (two) times daily for 7 days. 14 capsule 0  . fluconazole (DIFLUCAN) 150 MG tablet Take 1 tablet (150 mg total) by mouth daily. 1 tablet 0  . ibuprofen (ADVIL) 800 MG tablet Take 1 tablet (800 mg total) by mouth every 8 (eight) hours as needed. 60 tablet 1  . Insulin Degludec (TRESIBA) 100 UNIT/ML SOLN Inject 20 Units into the skin daily.     . Lancets (ONETOUCH DELICA PLUS LANCET33G) MISC     . LORazepam (ATIVAN) 0.5 MG tablet Take 1 tablet (0.5 mg total) by mouth 2 (two) times daily as needed for anxiety. 30 tablet 1  . metFORMIN (GLUCOPHAGE-XR) 500 MG 24 hr tablet Take 500 mg by mouth in the morning, at noon, and at bedtime.     .  norelgestromin-ethinyl estradiol [facility]) 150-35 MCG/24HR transdermal patch Place 1 patch onto the skin once a week. xulane patch (Patient not taking: No sig reported)    . omeprazole (PRILOSEC) 40 MG capsule Take 1 capsule (40 mg total) by mouth daily. 30 minutes before food 90 capsule 3  . ondansetron (ZOFRAN) 4 MG tablet Take 1 tablet (4 mg total) by mouth every 8 (eight) hours as needed for nausea or vomiting. (Patient not taking: No sig reported) 20 tablet 0  . ONETOUCH ULTRA test strip     . oxyCODONE (OXY IR/ROXICODONE) 5 MG immediate release tablet Take 1 tablet (5 mg total) by mouth every 4 (four) hours as needed for severe pain. 30 tablet 0  . Semaglutide,0.25 or 0.5MG /DOS, (OZEMPIC, 0.25 OR 0.5 MG/DOSE,) 2 MG/1.5ML SOPN Inject 1 mg into the skin once a week.      No current facility-administered medications for this visit.       Psychiatric Specialty Exam: ROS  Last menstrual period 08/19/2020.There is no height or weight on file to calculate BMI.  General Appearance: Well Groomed  Eye Contact:  Good  Speech:  Clear and Coherent and Normal Rate  Volume:  Normal  Mood:  Euthymic  Affect:  Congruent  Thought Process:  Goal Directed, Linear and Descriptions of Associations: Intact  Orientation:  Full (Time, Place, and Person)  Thought Content: Logical   Suicidal Thoughts:  No  Homicidal Thoughts:  No  Memory:  Recent;   Good Remote;   Good  Judgement:  Good  Insight:  Good  Psychomotor Activity:  Normal  Concentration:  Concentration: Good and Attention Span: Good  Recall:  Good  Fund of Knowledge: Good  Language: Good  Akathisia:  No  Handed:  Right  AIMS (if indicated): not done  Assets:  Communication Skills Desire for Improvement Financial Resources/Insurance Housing Social Support Talents/Skills Transportation  ADL's:  Intact  Cognition: WNL  Sleep:  Good    Assessment and Plan: Patient appears to be stable on her regimen.  1. Attention deficit  hyperactivity disorder (ADHD), predominantly inattentive type  - amphetamine-dextroamphetamine (ADDERALL XR) 20 MG 24 hr capsule;  Take 2 capsules in the morning  Dispense: 60 capsule; Refill: 0 - amphetamine-dextroamphetamine (ADDERALL XR) 20 MG 24 hr capsule; Take 2 capsules in the morning  Dispense: 60 capsule; Refill: 0 - amphetamine-dextroamphetamine (ADDERALL XR) 20 MG 24 hr capsule; Take 2 capsules in the morning  Dispense: 60 capsule; Refill: 0  2. Depression with anxiety  - buPROPion (WELLBUTRIN XL) 300 MG 24 hr tablet; Take 1 tablet (300 mg total) by mouth every morning.  Dispense: 90 tablet; Refill: 0 - LORazepam (ATIVAN) 0.5 MG tablet; Take 1 tablet (0.5 mg total) by mouth 2 (two) times daily as needed for anxiety.  Dispense: 45 tablet; Refill: 1  Continue same regimen. F/up in 3 months.  Zena Amos, MD 08/30/2020, 8:58 AM

## 2020-08-31 ENCOUNTER — Encounter: Payer: Self-pay | Admitting: Surgery

## 2020-08-31 ENCOUNTER — Other Ambulatory Visit: Payer: Self-pay

## 2020-08-31 ENCOUNTER — Ambulatory Visit (INDEPENDENT_AMBULATORY_CARE_PROVIDER_SITE_OTHER): Payer: Managed Care, Other (non HMO) | Admitting: Surgery

## 2020-08-31 VITALS — BP 135/88 | HR 97 | Temp 98.6°F | Ht 66.0 in | Wt 178.8 lb

## 2020-08-31 DIAGNOSIS — L02412 Cutaneous abscess of left axilla: Secondary | ICD-10-CM

## 2020-08-31 DIAGNOSIS — Z09 Encounter for follow-up examination after completed treatment for conditions other than malignant neoplasm: Secondary | ICD-10-CM

## 2020-08-31 DIAGNOSIS — L732 Hidradenitis suppurativa: Secondary | ICD-10-CM

## 2020-08-31 NOTE — Patient Instructions (Signed)
We have sent referral to  [facility] center-[phone]. Someone from their office  will call to schedule an appointment. Please see your follow up appointment listed below.

## 2020-08-31 NOTE — Progress Notes (Signed)
08/31/2020  HPI: Dawn Ortiz is a 30 y.o. female s/p I&D of left axillary abscess in the OR on 08/23/20 with two penrose drains left in place.  She has a history of multiple I&D's in the past.  She has hydradenitis.   She presents today for follow up.  Reports that the pain has improved but there's still discomfort in the left axilla.  She able to move her arm much more compared to preop.  Vital signs: BP 135/88   Pulse 97   Temp 98.6 F (37 C) (Oral)   Ht 5\' 6"  (1.676 m)   Wt 178 lb 12.8 oz (81.1 kg)   LMP 08/19/2020   SpO2 92%   BMI 28.86 kg/m    Physical Exam: Constitutional:  No acute distress Skin:  Left axilla s/p I&D with four incisions and two penrose drains in place.  The wound are healing well.  The inferior drain was removed today.  Assessment/Plan: This is a 30 y.o. female s/p I&D of left axillary abscess  --Inferior drain was removed today without issues.  Dry gauze dressing applied.   --Follow up in one week to remove the remaining drain. --Continue antibiotics until course completed. --Start referral to Dermatology for follow up of hydradenitis   37, MD [facility]

## 2020-09-05 ENCOUNTER — Encounter: Payer: Managed Care, Other (non HMO) | Admitting: Surgery

## 2020-09-07 ENCOUNTER — Other Ambulatory Visit: Payer: Self-pay

## 2020-09-07 ENCOUNTER — Encounter: Payer: Self-pay | Admitting: Surgery

## 2020-09-07 ENCOUNTER — Ambulatory Visit (INDEPENDENT_AMBULATORY_CARE_PROVIDER_SITE_OTHER): Payer: Managed Care, Other (non HMO) | Admitting: Surgery

## 2020-09-07 VITALS — BP 164/86 | HR 103 | Temp 98.0°F | Ht 62.0 in | Wt 175.4 lb

## 2020-09-07 DIAGNOSIS — L02412 Cutaneous abscess of left axilla: Secondary | ICD-10-CM

## 2020-09-07 DIAGNOSIS — Z09 Encounter for follow-up examination after completed treatment for conditions other than malignant neoplasm: Secondary | ICD-10-CM

## 2020-09-07 NOTE — Progress Notes (Signed)
09/07/2020  HPI: Dawn Ortiz is a 30 y.o. female s/p I&D of left axillary abscess.  She was seen last week and one of two penrose drains was removed.  Today, she reports she's doing well, with some pain at the remaining drain site, but nothing worsening.  Vital signs: BP (!) 164/86   Pulse (!) 103   Temp 98 F (36.7 C) (Oral)   Ht 5\' 2"  (1.575 m)   Wt 175 lb 6.4 oz (79.6 kg)   LMP 08/19/2020   SpO2 97%   BMI 32.08 kg/m    Physical Exam: Constitutional:  No acute distress Skin:  Left axilla s/p I&D with 4 total incisions.  Inferior two incisions where prior penrose drain was located are healing well, without purulence and with healthy granulation tissue.  Superior two incisions have penrose drain in place, with some fibrinous material.  Drain was removed as well as the fibrinous material without complication.  Dry gauze dressing applied.  Assessment/Plan: This is a 30 y.o. female s/p I&D of left axillary abscess  --Patient healing well.  Remaining penrose drain removed.  No evidence of worsening infection, induration, or erythema. --Continue dry gauze dressing until wounds fully healed.   --Follow up prn   37, MD [facility]

## 2020-09-07 NOTE — Patient Instructions (Addendum)
If you have any concerns or questions, please feel free to call our office.  I will call dermatology to get you an appointment set up.

## 2020-09-25 ENCOUNTER — Encounter: Payer: Self-pay | Admitting: Surgery

## 2020-09-26 ENCOUNTER — Other Ambulatory Visit: Payer: Self-pay

## 2020-09-26 MED ORDER — CIPROFLOXACIN HCL 500 MG PO TABS: 500.0000 mg | ORAL_TABLET | Freq: Two times a day (BID) | ORAL | 0 refills | Status: DC

## 2020-09-28 ENCOUNTER — Ambulatory Visit: Payer: Managed Care, Other (non HMO) | Admitting: Surgery

## 2020-09-30 ENCOUNTER — Other Ambulatory Visit: Payer: Self-pay

## 2020-09-30 ENCOUNTER — Encounter: Payer: Self-pay | Admitting: Surgery

## 2020-09-30 ENCOUNTER — Ambulatory Visit (INDEPENDENT_AMBULATORY_CARE_PROVIDER_SITE_OTHER): Payer: Managed Care, Other (non HMO) | Admitting: Surgery

## 2020-09-30 VITALS — BP 138/81 | HR 109 | Temp 98.9°F | Ht 62.0 in | Wt 175.0 lb

## 2020-09-30 DIAGNOSIS — L02419 Cutaneous abscess of limb, unspecified: Secondary | ICD-10-CM | POA: Diagnosis not present

## 2020-09-30 NOTE — Progress Notes (Signed)
09/30/2020  History of Present Illness: Dawn Ortiz is a 30 y.o. female presenting for evaluation of bilateral axillary abscess.  She's had multiple prior I&Ds of bilateral axillary abscess, breast abscess, and thigh abscess in the past.  Most recently had I&D of left axillary abscess requiring two penrose drains.  She called this week because she started developing bilateral axillary abscesses.  She tried taking leftover Doxycycline she had but the areas continued to worsen.  She was changed to Cipro which she reports has been helping considerably more.  The left axilla is no longer tender and the right axilla has improved and is softer.    Past Medical History: Past Medical History:  Diagnosis Date  . Abscess of left breast   . Abscess of left thigh   . Abscess of right breast   . Abscess of right thigh   . Abscess of the breast and nipple 03/02/2019  . Anxiety   . Axillary abscess 07/02/2016  . Biliary colic   . Diabetes mellitus without complication (HCC)   . GERD (gastroesophageal reflux disease)   . Leukocytosis 07/19/2019  . Migraines      Past Surgical History: Past Surgical History:  Procedure Laterality Date  . CHOLECYSTECTOMY    . ESOPHAGOGASTRODUODENOSCOPY (EGD) WITH PROPOFOL N/A 10/01/2019   Procedure: ESOPHAGOGASTRODUODENOSCOPY (EGD) WITH PROPOFOL;  Surgeon: Toney Reil, MD;  Location: [facility] ENDOSCOPY;  Service: Gastroenterology;  Laterality: N/A;  . I & D EXTREMITY Left 03/05/2019   Procedure: Irrigation and debridement;  Surgeon: Henrene Dodge, MD;  Location: [facility] ORS;  Service: General;  Laterality: Left;  . INCISION AND DRAINAGE     right breast x1 and left groin x1 and left axilla  . INCISION AND DRAINAGE ABSCESS Left 07/02/2016   Procedure: INCISION AND DRAINAGE ABSCESS;  Surgeon: Lattie Haw, MD;  Location: [facility] ORS;  Service: General;  Laterality: Left;  . INCISION AND DRAINAGE ABSCESS Left 04/18/2017   Procedure: INCISION AND DRAINAGE ABSCESS-LEFT  GROIN;  Surgeon: Ancil Linsey, MD;  Location: [facility] ORS;  Service: General;  Laterality: Left;  . INCISION AND DRAINAGE ABSCESS Right 05/21/2018   Procedure: INCISION AND DRAINAGE ABSCESS- RIGHT THIGH;  Surgeon: Henrene Dodge, MD;  Location: [facility] ORS;  Service: General;  Laterality: Right;  . INCISION AND DRAINAGE ABSCESS Left 08/23/2020   Procedure: INCISION AND DRAINAGE ABSCESS, axillary;  Surgeon: Henrene Dodge, MD;  Location: [facility] ORS;  Service: General;  Laterality: Left;  provider requesting 2 hours /120 minutes for procedure  . IRRIGATION AND DEBRIDEMENT ABSCESS Right 04/09/2018   Procedure: IRRIGATION AND DEBRIDEMENT ABSCESS;  Surgeon: Henrene Dodge, MD;  Location: [facility] ORS;  Service: General;  Laterality: Right;  . IRRIGATION AND DEBRIDEMENT ABSCESS Left 05/21/2018   Procedure: IRRIGATION AND DEBRIDEMENT BREAST ABSCESS;  Surgeon: Henrene Dodge, MD;  Location: [facility] ORS;  Service: General;  Laterality: Left;  . IRRIGATION AND DEBRIDEMENT ABSCESS Right 01/06/2020   Procedure: MINOR INCISION AND DRAINAGE OF ABSCESS, right axillary;  Surgeon: Campbell Lerner, MD;  Location: [facility] ORS;  Service: General;  Laterality: Right;  . PILONIDAL CYST EXCISION  12/03/2014   Procedure: CYST EXCISION PILONIDAL EXTENSIVE;  Surgeon: Duwaine Maxin, MD;  Location: [facility] ORS;  Service: General;;    Home Medications: Prior to Admission medications   Medication Sig Start Date End Date Taking? Authorizing Provider  amphetamine-dextroamphetamine (ADDERALL XR) 20 MG 24 hr capsule Take 2 capsules in the morning 10/28/20  Yes Zena Amos, MD  B-D ULTRAFINE III SHORT PEN 31G X 8  MM MISC  07/28/19  Yes [provider]  buPROPion (WELLBUTRIN XL) 300 MG 24 hr tablet Take 1 tablet (300 mg total) by mouth every morning. 08/30/20  Yes Zena Amos, MD  ciprofloxacin (CIPRO) 500 MG tablet Take 1 tablet (500 mg total) by mouth 2 (two) times daily for 10 days. 09/26/20 10/06/20 Yes Judy Pollman, Elita Quick, MD  Clindamycin  Phosphate foam  07/28/19  Yes [provider]  fluconazole (DIFLUCAN) 150 MG tablet Take 1 tablet (150 mg total) by mouth daily. 08/26/20  Yes Kamali Nephew, Elita Quick, MD  ibuprofen (ADVIL) 800 MG tablet Take 1 tablet (800 mg total) by mouth every 8 (eight) hours as needed. 08/23/20  Yes Tor Tsuda, Elita Quick, MD  Insulin Degludec (TRESIBA) 100 UNIT/ML SOLN Inject 20 Units into the skin daily.    Yes [provider]  Lancets (ONETOUCH DELICA PLUS [place]) MISC  07/28/19  Yes [provider]  LORazepam (ATIVAN) 0.5 MG tablet Take 1 tablet (0.5 mg total) by mouth 2 (two) times daily as needed for anxiety. 08/30/20  Yes Zena Amos, MD  metFORMIN (GLUCOPHAGE-XR) 500 MG 24 hr tablet Take 500 mg by mouth in the morning, at noon, and at bedtime.  07/03/19  Yes [provider]  omeprazole (PRILOSEC) 40 MG capsule Take 1 capsule (40 mg total) by mouth daily. 30 minutes before food 12/30/19  Yes McLean-Scocuzza, Pasty Spillers, MD  ondansetron (ZOFRAN) 4 MG tablet Take 1 tablet (4 mg total) by mouth every 8 (eight) hours as needed for nausea or vomiting. 07/06/19  Yes Huston Foley, MD  ONETOUCH ULTRA test strip  07/28/19  Yes [provider]  Semaglutide,0.25 or 0.5MG /DOS, (OZEMPIC, 0.25 OR 0.5 MG/DOSE,) 2 MG/1.5ML SOPN Inject 1 mg into the skin once a week.    Yes [provider]    Allergies: Allergies  Allergen Reactions  . Sulfa Antibiotics Hives    Review of Systems: Review of Systems  Constitutional: Negative for chills and fever.  Respiratory: Negative for shortness of breath.   Cardiovascular: Negative for chest pain.  Gastrointestinal: Negative for abdominal pain, nausea and vomiting.  Skin:       Bilateral axillary abscess    Physical Exam BP 138/81   Pulse (!) 109   Temp 98.9 F (37.2 C)   Ht 5\' 2"  (1.575 m)   Wt 175 lb (79.4 kg)   LMP 08/31/2020   SpO2 98%   BMI 32.01 kg/m  CONSTITUTIONAL: No acute distress HEENT:  Normocephalic, atraumatic,  extraocular motion intact. RESPIRATORY:  Normal respiratory effort without pathologic use of accessory muscles. CARDIOVASCULAR:  Regular rhythm and rate. SKIN:Left axilla has one area that's opened from prior I&D site with healthy granulation tissue, but no purulence, no erythema, and no induration.  Normal left shoulder range of motion.  On the right axilla, the patient's axilla is soft, with some mild fluctuance anteriorly in an area about 1 x 2 cm.  The overlying skin has no erythema or induration, and the skin is not thinned out.  Range of motion is a bit decreased with bringing arm over head due to some discomfort. NEUROLOGIC:  Motor and sensation is grossly normal.  Cranial nerves are grossly intact. PSYCH:  Alert and oriented to person, place and time. Affect is normal.   Assessment and Plan: This is a 30 y.o. female with bilateral axillary abscesses.  --Discussed with the patient that the Cipro seems to be helping well.  No true abscess or fluctuance noted on the left, and improving fluctuance  on the right.  Can forgo I&D at this point.  Recommended that she continue the Cipro prescription until course completed.  Will follow up on Monday to evaluate progress to make sure no I&D is needed.  Face-to-face time spent with the patient and care providers was 15 minutes, with more than 50% of the time spent counseling, educating, and coordinating care of the patient.     Howie Ill, MD [facility]

## 2020-09-30 NOTE — Patient Instructions (Addendum)
Continue taking your Ciprofloxacin.  We will have you follow up here on Monday to recheck the area and do a possible I&D if needed.  Continue warm compresses to the area.

## 2020-10-03 ENCOUNTER — Ambulatory Visit: Payer: Managed Care, Other (non HMO) | Admitting: Surgery

## 2020-10-04 ENCOUNTER — Other Ambulatory Visit: Payer: Self-pay

## 2020-10-04 ENCOUNTER — Ambulatory Visit: Payer: Managed Care, Other (non HMO) | Admitting: Dermatology

## 2020-10-04 DIAGNOSIS — L732 Hidradenitis suppurativa: Secondary | ICD-10-CM

## 2020-10-04 MED ORDER — CLINDAMYCIN PHOSPHATE 1 % EX FOAM: CUTANEOUS | 3 refills | Status: DC

## 2020-10-04 NOTE — Progress Notes (Signed)
   Follow-Up Visit   Subjective  Dawn Ortiz is a 30 y.o. female who presents for the following: Hidradenitis Suppurtiva (Axilla, inframammary, thighs x years. Patient is currently on Cipro (1 day left) for outbreak under right arm. She is using PanOxyl 4% Wash and clindamycin foam prn. In the past, she has taken doxycycline, has used Hibiclens wash, and had multiple I&Ds to abscesses. ). Her breakouts vary, she can break out every few weeks or every few months. She has outbreaks in the same areas each time.   The following portions of the chart were reviewed this encounter and updated as appropriate:       Review of Systems:  No other skin or systemic complaints except as noted in HPI or Assessment and Plan.  Objective  Well appearing patient in no apparent distress; mood and affect are within normal limits.  A focused examination was performed including axilla, inframammary. Relevant physical exam findings are noted in the Assessment and Plan.  Objective  Bil Axilla, inframammary, groin: Ulceration of the left axilla; hyperpigmented ropey tunneling/scarring of the left axilla; firm sq nodule on the right axilla; scar on the right medial breast.   Assessment & Plan  Hidradenitis suppurativa Bil Axilla, inframammary, groin  Moderate, Hurley Type II Patient has been on chronic antibiotics in the past without improvement and continues to have outbreaks requiring surgery to drain or remove cysts.  Discussed Humira injections and side effects. Pending screening labs, will start. Prior labs reviewed and okay (CBC/diff, BMP) and will not repeat.  Information on HS given. Patient was instructed how to inject using the Humira training pen. She feels comfortable self-injecting at home.  Continue clindamycin foam qd/bid prn flares Continue PanOxyl 4% Creamy wash in the shower.  Reviewed risks of biologics including immunosuppression, infections, injection site reaction, and failure to  improve condition. Goal is control of skin condition, not cure.  Some older biologics such as Humira and Enbrel may slightly increase risk of malignancy and may worsen congestive heart failure. The use of biologics requires long term medication management, including periodic office visits and monitoring of blood work.  Pt advised to make sure vaccines are up to date prior to starting Humira, also to hold dose when sick or prior to major surgery.  Benzoyl peroxide can cause dryness and irritation of the skin. It can also bleach fabric. When used together with Aczone (dapsone) cream, it can stain the skin orange.   Other Related Procedures CBC with Differential/Platelet Hepatitis B surface antibody,qualitative Hepatitis B surface antigen Hepatitis C antibody HIV Antibody (routine testing w rflx) QuantiFERON-TB Gold Plus Hepatic Function Panel  Return in about 3 months (around 01/04/2021) for HS.  ICherlyn Labella, CMA, am acting as scribe for Willeen Niece, MD .  Documentation: I have reviewed the above documentation for accuracy and completeness, and I agree with the above.  Willeen Niece MD

## 2020-10-04 NOTE — Patient Instructions (Signed)
Reviewed risks of biologics including immunosuppression, infections, injection site reaction, and failure to improve condition. Goal is control of skin condition, not cure.  Some older biologics such as Humira and Enbrel may slightly increase risk of malignancy and may worsen congestive heart failure. The use of biologics requires long term medication management, including periodic office visits and monitoring of blood work.   

## 2020-10-08 LAB — QUANTIFERON-TB GOLD PLUS
QuantiFERON Mitogen Value: >10 IU/mL
QuantiFERON Nil Value: 0.07 IU/mL
QuantiFERON TB1 Ag Value: 0.19 IU/mL
QuantiFERON TB2 Ag Value: 0.19 IU/mL
QuantiFERON-TB Gold Plus: NEGATIVE

## 2020-10-08 LAB — HEPATIC FUNCTION PANEL
ALT: 22 IU/L (ref 0–32)
AST: 22 IU/L (ref 0–40)
Albumin: 4.6 g/dL (ref 3.9–5.0)
Alkaline Phosphatase: 121 IU/L (ref 44–121)
Bilirubin Total: <0.2 mg/dL (ref 0.0–1.2)
Bilirubin, Direct: <0.1 mg/dL (ref 0.00–0.40)
Total Protein: 7.6 g/dL (ref 6.0–8.5)

## 2020-10-08 LAB — CBC WITH DIFFERENTIAL/PLATELET
Basophils Absolute: 0 10*3/uL (ref 0.0–0.2)
Basos: 0 %
EOS (ABSOLUTE): 0.1 10*3/uL (ref 0.0–0.4)
Eos: 1 %
Hematocrit: 34.3 % (ref 34.0–46.6)
Hemoglobin: 10.9 g/dL — ABNORMAL LOW (ref 11.1–15.9)
Immature Grans (Abs): 0 10*3/uL (ref 0.0–0.1)
Immature Granulocytes: 0 %
Lymphocytes Absolute: 3.9 10*3/uL — ABNORMAL HIGH (ref 0.7–3.1)
Lymphs: 45 %
MCH: 25.2 pg — ABNORMAL LOW (ref 26.6–33.0)
MCHC: 31.8 g/dL (ref 31.5–35.7)
MCV: 79 fL (ref 79–97)
Monocytes Absolute: 0.6 10*3/uL (ref 0.1–0.9)
Monocytes: 7 %
Neutrophils Absolute: 4.1 10*3/uL (ref 1.4–7.0)
Neutrophils: 47 %
Platelets: 280 10*3/uL (ref 150–450)
RBC: 4.33 x10E6/uL (ref 3.77–5.28)
RDW: 14.6 % (ref 11.7–15.4)
WBC: 8.6 10*3/uL (ref 3.4–10.8)

## 2020-10-08 LAB — HEPATITIS C ANTIBODY: Hep C Virus Ab: 0.1 s/co ratio (ref 0.0–0.9)

## 2020-10-08 LAB — HEPATITIS B SURFACE ANTIBODY,QUALITATIVE: Hep B Surface Ab, Qual: NONREACTIVE

## 2020-10-08 LAB — HIV ANTIBODY (ROUTINE TESTING W REFLEX): HIV Screen 4th Generation wRfx: NONREACTIVE

## 2020-10-08 LAB — HEPATITIS B SURFACE ANTIGEN: Hepatitis B Surface Ag: NEGATIVE

## 2020-10-10 ENCOUNTER — Telehealth: Payer: Self-pay

## 2020-10-10 MED ORDER — HUMIRA-CD/UC/HS STARTER 40 MG/0.8ML ~~LOC~~ PNKT: 160.0000 mg | PEN_INJECTOR | SUBCUTANEOUS | 0 refills | Status: DC

## 2020-10-10 MED ORDER — HUMIRA (2 PEN) 40 MG/0.4ML ~~LOC~~ AJKT: 40.0000 mg | AUTO-INJECTOR | SUBCUTANEOUS | 4 refills | Status: DC

## 2020-10-10 NOTE — Telephone Encounter (Signed)
-----   Message from Tara Stewart, MD sent at 10/10/2020 12:04 PM EDT ----- Baseline labs okay, TB, Hep B/C screening, HIV all neg.  Okay to send in Humira for HS 

## 2020-10-10 NOTE — Telephone Encounter (Signed)
-----   Message from Willeen Niece, MD sent at 10/10/2020 12:04 PM EDT ----- Baseline labs okay, TB, Hep B/C screening, HIV all neg.  Okay to send in Humira for HS

## 2020-10-10 NOTE — Telephone Encounter (Signed)
Discussed labs normal, we will send in rx for Humira

## 2020-10-10 NOTE — Telephone Encounter (Signed)
Left patient a message to call for lab results./sh

## 2020-10-11 NOTE — Telephone Encounter (Signed)
Humira faxed to [facility]

## 2020-10-14 ENCOUNTER — Encounter: Payer: Self-pay | Admitting: Surgery

## 2020-10-14 ENCOUNTER — Other Ambulatory Visit: Payer: Self-pay

## 2020-10-14 MED ORDER — CIPROFLOXACIN HCL 500 MG PO TABS: 500.0000 mg | ORAL_TABLET | Freq: Two times a day (BID) | ORAL | 0 refills | Status: AC

## 2020-10-26 ENCOUNTER — Ambulatory Visit: Payer: Managed Care, Other (non HMO) | Admitting: Dermatology

## 2020-11-14 ENCOUNTER — Other Ambulatory Visit: Payer: Self-pay

## 2020-11-14 MED ORDER — HUMIRA (2 PEN) 40 MG/0.4ML ~~LOC~~ AJKT: 40.0000 mg | AUTO-INJECTOR | SUBCUTANEOUS | 4 refills | Status: DC

## 2020-11-14 MED ORDER — HUMIRA-CD/UC/HS STARTER 40 MG/0.8ML ~~LOC~~ PNKT: 160.0000 mg | PEN_INJECTOR | SUBCUTANEOUS | 0 refills | Status: DC

## 2020-11-14 NOTE — Progress Notes (Signed)
Per Michelene Heady, rx needs to be filled with [facility].

## 2020-11-24 ENCOUNTER — Telehealth (INDEPENDENT_AMBULATORY_CARE_PROVIDER_SITE_OTHER): Payer: 59 | Admitting: Psychiatry

## 2020-11-24 ENCOUNTER — Encounter (HOSPITAL_COMMUNITY): Payer: Self-pay | Admitting: Psychiatry

## 2020-11-24 ENCOUNTER — Other Ambulatory Visit: Payer: Self-pay

## 2020-11-24 DIAGNOSIS — F900 Attention-deficit hyperactivity disorder, predominantly inattentive type: Secondary | ICD-10-CM | POA: Diagnosis not present

## 2020-11-24 DIAGNOSIS — F418 Other specified anxiety disorders: Secondary | ICD-10-CM | POA: Diagnosis not present

## 2020-11-24 MED ORDER — BUPROPION HCL ER (XL) 300 MG PO TB24
300.0000 mg | ORAL_TABLET | ORAL | 0 refills | Status: DC
Start: 2020-11-24 — End: 2021-02-16

## 2020-11-24 MED ORDER — AMPHETAMINE-DEXTROAMPHET ER 20 MG PO CP24: ORAL_CAPSULE | ORAL | 0 refills | Status: DC

## 2020-11-24 MED ORDER — LORAZEPAM 0.5 MG PO TABS
0.5000 mg | ORAL_TABLET | Freq: Two times a day (BID) | ORAL | 1 refills | Status: DC | PRN
Start: 2020-11-24 — End: 2021-02-16

## 2020-11-24 NOTE — Progress Notes (Signed)
BH MD OP Progress Note  Virtual Visit via Video Note  I connected with Dawn Ortiz on 11/24/20 at  9:00 AM EDT by a video enabled telemedicine application and verified that I am speaking with the correct person using two identifiers.  Location: Patient: Work Provider: Clinic   I discussed the limitations of evaluation and management by telemedicine and the availability of in person appointments. The patient expressed understanding and agreed to proceed.  I provided 14 minutes of non-face-to-face time during this encounter.     11/24/2020 8:57 AM Dawn Ortiz  MRN:  [id number]  Chief Complaint:  " I am doing great."   HPI: Patient reports she is doing well.  She informed that she recently came back from a vacation last month.  [place].  She informed that she had a great time there.  She is going for another vacation next month, is going to [place]. She stated that she is looking forward to the application. She stated that her mood has been stable and everything is going really well for her.  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  She stated that her anxiety is well controlled and she hardly ever needs to use the Ativan. She denied any other concerns or issues today.   Visit Diagnosis:    ICD-10-CM   1. Depression with anxiety  F41.8   2. Attention deficit hyperactivity disorder (ADHD), predominantly inattentive type  F90.0     Past Psychiatric History: Depression, anxiety  Past Medical History:  Past Medical History:  Diagnosis Date  . Abscess of left breast   . Abscess of left thigh   . Abscess of right breast   . Abscess of right thigh   . Abscess of the breast and nipple 03/02/2019  . Anxiety   . Axillary abscess 07/02/2016  . Biliary colic   . Diabetes mellitus without complication (HCC)   . GERD (gastroesophageal reflux disease)   . Leukocytosis 07/19/2019  . Migraines     Past Surgical  History:  Procedure Laterality Date  . CHOLECYSTECTOMY    . ESOPHAGOGASTRODUODENOSCOPY (EGD) WITH PROPOFOL N/A 10/01/2019   Procedure: ESOPHAGOGASTRODUODENOSCOPY (EGD) WITH PROPOFOL;  Surgeon: Toney Reil, MD;  Location: [facility] ENDOSCOPY;  Service: Gastroenterology;  Laterality: N/A;  . I & D EXTREMITY Left 03/05/2019   Procedure: Irrigation and debridement;  Surgeon: Henrene Dodge, MD;  Location: [facility] ORS;  Service: General;  Laterality: Left;  . INCISION AND DRAINAGE     right breast x1 and left groin x1 and left axilla  . INCISION AND DRAINAGE ABSCESS Left 07/02/2016   Procedure: INCISION AND DRAINAGE ABSCESS;  Surgeon: Lattie Haw, MD;  Location: [facility] ORS;  Service: General;  Laterality: Left;  . INCISION AND DRAINAGE ABSCESS Left 04/18/2017   Procedure: INCISION AND DRAINAGE ABSCESS-LEFT GROIN;  Surgeon: Ancil Linsey, MD;  Location: [facility] ORS;  Service: General;  Laterality: Left;  . INCISION AND DRAINAGE ABSCESS Right 05/21/2018   Procedure: INCISION AND DRAINAGE ABSCESS- RIGHT THIGH;  Surgeon: Henrene Dodge, MD;  Location: [facility] ORS;  Service: General;  Laterality: Right;  . INCISION AND DRAINAGE ABSCESS Left 08/23/2020   Procedure: INCISION AND DRAINAGE ABSCESS, axillary;  Surgeon: Henrene Dodge, MD;  Location: [facility] ORS;  Service: General;  Laterality: Left;  provider requesting 2 hours /120 minutes for procedure  . IRRIGATION AND DEBRIDEMENT ABSCESS Right 04/09/2018   Procedure: IRRIGATION AND DEBRIDEMENT ABSCESS;  Surgeon: Aleen Campi,  Elita Quick, MD;  Location: [facility] ORS;  Service: General;  Laterality: Right;  . IRRIGATION AND DEBRIDEMENT ABSCESS Left 05/21/2018   Procedure: IRRIGATION AND DEBRIDEMENT BREAST ABSCESS;  Surgeon: Henrene Dodge, MD;  Location: [facility] ORS;  Service: General;  Laterality: Left;  . IRRIGATION AND DEBRIDEMENT ABSCESS Right 01/06/2020   Procedure: MINOR INCISION AND DRAINAGE OF ABSCESS, right axillary;  Surgeon: Campbell Lerner, MD;  Location: [facility] ORS;  Service:  General;  Laterality: Right;  . PILONIDAL CYST EXCISION  12/03/2014   Procedure: CYST EXCISION PILONIDAL EXTENSIVE;  Surgeon: Duwaine Maxin, MD;  Location: [facility] ORS;  Service: General;;     Family History:  Family History  Problem Relation Age of Onset  . Healthy Mother   . Hypertension Father   . Diabetes Father   . Heart disease Father   . Sarcoidosis Father   . Healthy Brother   . Obesity Brother   . Cancer Paternal Grandfather        colon and also siblings had cancer ?type  . Hypertension Other     Social History:  Social History   Socioeconomic History  . Marital status: Single    Spouse name: Not on file  . Number of children: Not on file  . Years of education: Not on file  . Highest education level: Not on file  Occupational History  . Not on file  Tobacco Use  . Smoking status: Former Smoker    Types: Cigarettes  . Smokeless tobacco: Never Used  Vaping Use  . Vaping Use: Never used  Substance and Sexual Activity  . Alcohol use: Yes    Alcohol/week: 1.0 - 2.0 standard drink    Types: 1 - 2 Glasses of wine per week    Comment: none last 24hrs  . Drug use: No  . Sexual activity: Yes    Birth control/protection: Implant  Other Topics Concern  . Not on file  Social History Narrative   DPR mom Dawn Ortiz [phone]    Dad Dawn Ortiz [phone]    No kids    Works for [employer]   Social Determinants of Health   Financial Resource Strain: Not on file  Food Insecurity: Not on file  Transportation Needs: Not on file  Physical Activity: Not on file  Stress: Not on file  Social Connections: Not on file    Allergies:  Allergies  Allergen Reactions  . Sulfa Antibiotics Hives    Metabolic Disorder Labs: Lab Results  Component Value Date   HGBA1C 8.9 (H) 07/19/2019   MPG 208.73 07/19/2019   No results found for: PROLACTIN Lab Results  Component Value Date   CHOL 127 02/26/2019   TRIG 96 02/26/2019   HDL 37 (L) 02/26/2019   CHOLHDL  3.4 02/26/2019   LDLCALC 71 02/26/2019   LDLCALC 94 10/21/2017   Lab Results  Component Value Date   TSH 0.773 02/26/2019   TSH 0.425 (L) 10/12/2016    Therapeutic Level Labs: No results found for: LITHIUM No results found for: VALPROATE No components found for:  CBMZ  Current Medications: Current Outpatient Medications  Medication Sig Dispense Refill  . Adalimumab (HUMIRA PEN) 40 MG/0.4ML PNKT Inject 40 mg into the skin once a week. Starting on day 29. 2 each 4  . Adalimumab (HUMIRA PEN-CD/UC/HS STARTER) 40 MG/0.8ML PNKT Inject 160 mg into the skin as directed. Inject 160 mg subcutaneous on day 1, 80 mg on day 15, then 40 mg once a week starting day 29. 1  each 0  . amphetamine-dextroamphetamine (ADDERALL XR) 20 MG 24 hr capsule Take 2 capsules in the morning 60 capsule 0  . B-D ULTRAFINE III SHORT PEN 31G X 8 MM MISC     . buPROPion (WELLBUTRIN XL) 300 MG 24 hr tablet Take 1 tablet (300 mg total) by mouth every morning. 90 tablet 0  . Clindamycin Phosphate foam Apply to affected areas 1-2 times daily as needed for outbreaks. 100 g 3  . fluconazole (DIFLUCAN) 150 MG tablet Take 1 tablet (150 mg total) by mouth daily. 1 tablet 0  . ibuprofen (ADVIL) 800 MG tablet Take 1 tablet (800 mg total) by mouth every 8 (eight) hours as needed. 60 tablet 1  . Insulin Degludec (TRESIBA) 100 UNIT/ML SOLN Inject 20 Units into the skin daily.     . Lancets (ONETOUCH DELICA PLUS LANCET33G) MISC     . LORazepam (ATIVAN) 0.5 MG tablet Take 1 tablet (0.5 mg total) by mouth 2 (two) times daily as needed for anxiety. 30 tablet 1  . metFORMIN (GLUCOPHAGE-XR) 500 MG 24 hr tablet Take 500 mg by mouth in the morning, at noon, and at bedtime.     Marland Kitchen. omeprazole (PRILOSEC) 40 MG capsule Take 1 capsule (40 mg total) by mouth daily. 30 minutes before food 90 capsule 3  . ondansetron (ZOFRAN) 4 MG tablet Take 1 tablet (4 mg total) by mouth every 8 (eight) hours as needed for nausea or vomiting. 20 tablet 0  . ONETOUCH  ULTRA test strip     . Semaglutide,0.25 or 0.5MG /DOS, (OZEMPIC, 0.25 OR 0.5 MG/DOSE,) 2 MG/1.5ML SOPN Inject 1 mg into the skin once a week.      No current facility-administered medications for this visit.       Psychiatric Specialty Exam: ROS  There were no vitals taken for this visit.There is no height or weight on file to calculate BMI.  General Appearance: Well Groomed, wearing work uniform  Eye Contact:  Good  Speech:  Clear and Coherent and Normal Rate  Volume:  Normal  Mood:  Euthymic  Affect:  Congruent  Thought Process:  Goal Directed, Linear and Descriptions of Associations: Intact  Orientation:  Full (Time, Place, and Person)  Thought Content: Logical   Suicidal Thoughts:  No  Homicidal Thoughts:  No  Memory:  Recent;   Good Remote;   Good  Judgement:  Good  Insight:  Good  Psychomotor Activity:  Normal  Concentration:  Concentration: Good and Attention Span: Good  Recall:  Good  Fund of Knowledge: Good  Language: Good  Akathisia:  No  Handed:  Right  AIMS (if indicated): not done  Assets:  Communication Skills Desire for Improvement Financial Resources/Insurance Housing Social Support Talents/Skills Transportation  ADL's:  Intact  Cognition: WNL  Sleep:  Good    Assessment and Plan: Patient is doing well on her regimen.  1. Attention deficit hyperactivity disorder (ADHD), predominantly inattentive type  - amphetamine-dextroamphetamine (ADDERALL XR) 20 MG 24 hr capsule; Take 2 capsules in the morning  Dispense: 60 capsule; Refill: 0 - amphetamine-dextroamphetamine (ADDERALL XR) 20 MG 24 hr capsule; Take 2 capsules in the morning  Dispense: 60 capsule; Refill: 0 - amphetamine-dextroamphetamine (ADDERALL XR) 20 MG 24 hr capsule; Take 2 capsules in the morning  Dispense: 60 capsule; Refill: 0  2. Depression with anxiety  - buPROPion (WELLBUTRIN XL) 300 MG 24 hr tablet; Take 1 tablet (300 mg total) by mouth every morning.  Dispense: 90 tablet; Refill:  0 - LORazepam (  ATIVAN) 0.5 MG tablet; Take 1 tablet (0.5 mg total) by mouth 2 (two) times daily as needed for anxiety.  Dispense: 45 tablet; Refill: 1  Continue same regimen. F/up in 3 months. Patient was informed that her care is being transferred to a different provider at [facility] psychiatry clinic.  Patient verbalized her understanding.  Zena Amos, MD 11/24/2020, 8:57 AM

## 2020-12-19 ENCOUNTER — Telehealth (HOSPITAL_COMMUNITY): Payer: Self-pay | Admitting: *Deleted

## 2020-12-19 NOTE — Telephone Encounter (Signed)
Prior authorization request received from [facility] re patients Adderall. PA done thru [place] on the phone and at the end of it person assisting me said it couldn't be approved over the phone, it would be sent off for review and the turn around time was 5 days.

## 2020-12-20 ENCOUNTER — Telehealth (HOSPITAL_COMMUNITY): Payer: Self-pay | Admitting: *Deleted

## 2020-12-20 NOTE — Telephone Encounter (Signed)
Prior authorization obtained for patients Dextroamphetamine and its effective till 12/19/21. [facility] pharmacy notified at [phone].

## 2021-01-09 ENCOUNTER — Ambulatory Visit: Payer: Managed Care, Other (non HMO) | Admitting: Dermatology

## 2021-01-31 ENCOUNTER — Ambulatory Visit
Admission: RE | Admit: 2021-01-31 | Discharge: 2021-01-31 | Disposition: A | Discharge status: Home / Self Care | Payer: Managed Care, Other (non HMO) | Source: Ambulatory Visit | Attending: Emergency Medicine | Admitting: Emergency Medicine

## 2021-01-31 VITALS — BP 129/88 | HR 95 | Temp 98.9°F | Resp 18

## 2021-01-31 DIAGNOSIS — Z1152 Encounter for screening for COVID-19: Secondary | ICD-10-CM | POA: Diagnosis not present

## 2021-01-31 DIAGNOSIS — J069 Acute upper respiratory infection, unspecified: Secondary | ICD-10-CM | POA: Diagnosis not present

## 2021-01-31 DIAGNOSIS — Z20822 Contact with and (suspected) exposure to covid-19: Secondary | ICD-10-CM

## 2021-01-31 NOTE — Discharge Instructions (Addendum)
Your COVID test is pending.  You should self quarantine until the test result is back.    Take Tylenol or ibuprofen as needed for fever or discomfort.  Rest and keep yourself hydrated.    Follow-up with your primary care provider if your symptoms are not improving.     

## 2021-01-31 NOTE — ED Triage Notes (Signed)
Pt presents with cough,sinus pressure, SOB with using stairs, nasal drainage x 4 days. No fever.  Chest hurts from coughing. Mucinex at home not helping.

## 2021-01-31 NOTE — ED Provider Notes (Signed)
[name]    CSN: [id number] Arrival date & time: 01/31/21  1146      History   Chief Complaint Chief Complaint  Patient presents with   Facial Pain     HPI Dawn Ortiz is a 30 y.o. female.  Patient presents with 4-day history of congestion, postnasal drip, sinus pressure, cough, shortness of breath with exertion.  Her symptoms started 1 week after returning from a trip out of the country; several of her travel companions tested positive for COVID.  She denies fever, chills, diarrhea, or other symptoms.  Treatment attempted at home with Mucinex.  Patient's medical history includes diabetes, hypertension, GERD.  The history is provided by the patient and medical records.   Past Medical History:  Diagnosis Date   Abscess of left breast    Abscess of left thigh    Abscess of right breast    Abscess of right thigh    Abscess of the breast and nipple 03/02/2019   Anxiety    Axillary abscess 07/02/2016   Biliary colic    Diabetes mellitus without complication (HCC)    GERD (gastroesophageal reflux disease)    Leukocytosis 07/19/2019   Migraines     Patient Active Problem List   Diagnosis Date Noted   Hidradenitis suppurativa 04/29/2020   Annual physical exam 04/29/2020   Sinus tachycardia 04/29/2020   Hypertension associated with diabetes (HCC) 04/29/2020   Obesity (BMI 30.0-34.9) 04/29/2020   Dyspepsia    Abdominal pain 07/19/2019   Intractable nausea and vomiting 07/19/2019   HTN (hypertension) 07/19/2019   Attention deficit hyperactivity disorder (ADHD), 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   Depression with anxiety 05/18/2019   Type 2 diabetes mellitus with hyperglycemia, with long-term current use of insulin (HCC) 02/06/2019   Anxiety    Diabetes mellitus without complication (HCC)    GERD (gastroesophageal reflux disease)    Abscess of left axilla 07/02/2016    Past Surgical History:  Procedure Laterality Date   CHOLECYSTECTOMY      ESOPHAGOGASTRODUODENOSCOPY (EGD) WITH PROPOFOL N/A 10/01/2019   Procedure: ESOPHAGOGASTRODUODENOSCOPY (EGD) WITH PROPOFOL;  Surgeon: Toney Reil, MD;  Location: [facility] ENDOSCOPY;  Service: Gastroenterology;  Laterality: N/A;   I & D EXTREMITY Left 03/05/2019   Procedure: Irrigation and debridement;  Surgeon: Henrene Dodge, MD;  Location: [facility] ORS;  Service: General;  Laterality: Left;   INCISION AND DRAINAGE     right breast x1 and left groin x1 and left axilla   INCISION AND DRAINAGE ABSCESS Left 07/02/2016   Procedure: INCISION AND DRAINAGE ABSCESS;  Surgeon: Lattie Haw, MD;  Location: [facility] ORS;  Service: General;  Laterality: Left;   INCISION AND DRAINAGE ABSCESS Left 04/18/2017   Procedure: INCISION AND DRAINAGE ABSCESS-LEFT GROIN;  Surgeon: Ancil Linsey, MD;  Location: [facility] ORS;  Service: General;  Laterality: Left;   INCISION AND DRAINAGE ABSCESS Right 05/21/2018   Procedure: INCISION AND DRAINAGE ABSCESS- RIGHT THIGH;  Surgeon: Henrene Dodge, MD;  Location: [facility] ORS;  Service: General;  Laterality: Right;   INCISION AND DRAINAGE ABSCESS Left 08/23/2020   Procedure: INCISION AND DRAINAGE ABSCESS, axillary;  Surgeon: Henrene Dodge, MD;  Location: [facility] ORS;  Service: General;  Laterality: Left;  provider requesting 2 hours /120 minutes for procedure   IRRIGATION AND DEBRIDEMENT ABSCESS Right 04/09/2018   Procedure: IRRIGATION AND DEBRIDEMENT ABSCESS;  Surgeon: Henrene Dodge, MD;  Location: [facility] ORS;  Service: General;  Laterality: Right;   IRRIGATION AND DEBRIDEMENT ABSCESS Left 05/21/2018   Procedure:  IRRIGATION AND DEBRIDEMENT BREAST ABSCESS;  Surgeon: Henrene Dodge, MD;  Location: [facility] ORS;  Service: General;  Laterality: Left;   IRRIGATION AND DEBRIDEMENT ABSCESS Right 01/06/2020   Procedure: MINOR INCISION AND DRAINAGE OF ABSCESS, right axillary;  Surgeon: Campbell Lerner, MD;  Location: [facility] ORS;  Service: General;  Laterality: Right;   PILONIDAL CYST EXCISION  12/03/2014    Procedure: CYST EXCISION PILONIDAL EXTENSIVE;  Surgeon: Duwaine Maxin, MD;  Location: [facility] ORS;  Service: General;;    OB History   No obstetric history on file.      Home Medications    Prior to Admission medications   Medication Sig Start Date End Date Taking? Authorizing Provider  Adalimumab (HUMIRA PEN) 40 MG/0.4ML PNKT Inject 40 mg into the skin once a week. Starting on day 29. 11/14/20  Yes Willeen Niece, MD  Adalimumab [facility] PEN-CD/UC/HS STARTER) 40 MG/0.8ML PNKT Inject 160 mg into the skin as directed. Inject 160 mg subcutaneous on day 1, 80 mg on day 15, then 40 mg once a week starting day 29. 11/14/20  Yes Willeen Niece, MD  amphetamine-dextroamphetamine (ADDERALL XR) 20 MG 24 hr capsule Take 2 capsules in the morning 12/24/20  Yes Zena Amos, MD  B-D ULTRAFINE III SHORT PEN 31G X 8 MM MISC  07/28/19  Yes [provider]  buPROPion (WELLBUTRIN XL) 300 MG 24 hr tablet Take 1 tablet (300 mg total) by mouth every morning. 11/24/20  Yes Zena Amos, MD  ibuprofen (ADVIL) 800 MG tablet Take 1 tablet (800 mg total) by mouth every 8 (eight) hours as needed. 08/23/20  Yes Piscoya, Elita Quick, MD  Lancets [facility] DELICA PLUS [place]) MISC  07/28/19  Yes [provider]  LORazepam (ATIVAN) 0.5 MG tablet Take 1 tablet (0.5 mg total) by mouth 2 (two) times daily as needed for anxiety. 11/24/20  Yes Zena Amos, MD  metFORMIN (GLUCOPHAGE-XR) 500 MG 24 hr tablet Take 500 mg by mouth in the morning, at noon, and at bedtime.  07/03/19  Yes [provider]  omeprazole (PRILOSEC) 40 MG capsule Take 1 capsule (40 mg total) by mouth daily. 30 minutes before food 12/30/19  Yes McLean-Scocuzza, Pasty Spillers, MD  [facility] ULTRA test strip  07/28/19  Yes [provider]  Semaglutide,0.25 or 0.5MG /DOS, (OZEMPIC, 0.25 OR 0.5 MG/DOSE,) 2 MG/1.5ML SOPN Inject 1 mg into the skin once a week.    Yes [provider]  amphetamine-dextroamphetamine (ADDERALL XR) 20 MG 24 hr  capsule Take 2 capsules in the morning 11/24/20   Zena Amos, MD  amphetamine-dextroamphetamine (ADDERALL XR) 20 MG 24 hr capsule Take 2 capsules in the morning 01/23/21   Zena Amos, MD  Clindamycin Phosphate foam Apply to affected areas 1-2 times daily as needed for outbreaks. 10/04/20   Willeen Niece, MD  fluconazole (DIFLUCAN) 150 MG tablet Take 1 tablet (150 mg total) by mouth daily. 08/26/20   Henrene Dodge, MD  Insulin Degludec (TRESIBA) 100 UNIT/ML SOLN Inject 20 Units into the skin daily.     [provider]  ondansetron (ZOFRAN) 4 MG tablet Take 1 tablet (4 mg total) by mouth every 8 (eight) hours as needed for nausea or vomiting. 07/06/19   Huston Foley, MD    Family History Family History  Problem Relation Age of Onset   Healthy Mother    Hypertension Father    Diabetes Father    Heart disease Father    Sarcoidosis Father    Healthy Brother    Obesity Brother  Cancer Paternal Grandfather        colon and also siblings had cancer ?type   Hypertension Other     Social History Social History   Tobacco Use   Smoking status: Former    Pack years: 0.00    Types: Cigarettes   Smokeless tobacco: Never  Vaping Use   Vaping Use: Never used  Substance Use Topics   Alcohol use: Yes    Alcohol/week: 1.0 - 2.0 standard drink    Types: 1 - 2 Glasses of wine per week    Comment: none last 24hrs   Drug use: No     Allergies   Sulfa antibiotics   Review of Systems Review of Systems  Constitutional:  Negative for chills and fever.  HENT:  Positive for congestion, postnasal drip and sinus pressure. Negative for ear pain and sore throat.   Respiratory:  Positive for cough and shortness of breath.   Cardiovascular:  Negative for chest pain and palpitations.  Gastrointestinal:  Negative for abdominal pain, diarrhea and vomiting.  Skin:  Negative for color change and rash.  All other systems reviewed and are negative.   Physical Exam Triage Vital Signs ED  Triage Vitals  Enc Vitals Group     BP      Pulse      Resp      Temp      Temp src      SpO2      Weight      Height      Head Circumference      Peak Flow      Pain Score      Pain Loc      Pain Edu?      Excl. in GC?    No data found.  Updated Vital Signs BP 129/88 (BP Location: Left Arm)   Pulse 95   Temp 98.9 F (37.2 C) (Oral)   Resp 18   LMP 01/24/2021   SpO2 98%   Visual Acuity Right Eye Distance:   Left Eye Distance:   Bilateral Distance:    Right Eye Near:   Left Eye Near:    Bilateral Near:     Physical Exam Vitals and nursing note reviewed.  Constitutional:      General: She is not in acute distress.    Appearance: She is well-developed. She is not ill-appearing.  HENT:     Head: Normocephalic and atraumatic.     Right Ear: Tympanic membrane normal.     Left Ear: Tympanic membrane normal.     Nose: Nose normal.     Mouth/Throat:     Mouth: Mucous membranes are moist.     Pharynx: Oropharynx is clear.  Eyes:     Conjunctiva/sclera: Conjunctivae normal.  Cardiovascular:     Rate and Rhythm: Normal rate and regular rhythm.     Heart sounds: Normal heart sounds.  Pulmonary:     Effort: Pulmonary effort is normal. No respiratory distress.     Breath sounds: Normal breath sounds.  Abdominal:     Palpations: Abdomen is soft.     Tenderness: There is no abdominal tenderness.  Musculoskeletal:     Cervical back: Neck supple.  Skin:    General: Skin is warm and dry.  Neurological:     General: No focal deficit present.     Mental Status: She is alert and oriented to person, place, and time.     Gait: Gait normal.  Psychiatric:  Mood and Affect: Mood normal.        Behavior: Behavior normal.     UC Treatments / Results  Labs (all labs ordered are listed, but only abnormal results are displayed) Labs Reviewed  NOVEL CORONAVIRUS, NAA    EKG   Radiology No results found.  Procedures Procedures (including critical care  time)  Medications Ordered in UC Medications - No data to display  Initial Impression / Assessment and Plan / UC Course  I have reviewed the triage vital signs and the nursing notes.  Pertinent labs & imaging results that were available during my care of the patient were reviewed by me and considered in my medical decision making (see chart for details).  Viral URI, exposure to COVID.  COVID pending.  Instructed patient to self quarantine per CDC guidelines.  Discussed symptomatic treatment including Tylenol or ibuprofen, rest, hydration.  Instructed patient to follow up with PCP if symptoms are not improving.  Patient agrees to plan of care.    Final Clinical Impressions(s) / UC Diagnoses   Final diagnoses:  Encounter for screening for COVID-19  Viral URI  Exposure to COVID-19 virus     Discharge Instructions      Your COVID test is pending.  You should self quarantine until the test result is back.    Take Tylenol or ibuprofen as needed for fever or discomfort.  Rest and keep yourself hydrated.    Follow-up with your primary care provider if your symptoms are not improving.         ED Prescriptions   None    PDMP not reviewed this encounter.   Mickie Bail, NP 01/31/21 [phone]

## 2021-02-01 LAB — NOVEL CORONAVIRUS, NAA: SARS-CoV-2, NAA: DETECTED — AB

## 2021-02-01 LAB — SARS-COV-2, NAA 2 DAY TAT

## 2021-02-15 ENCOUNTER — Other Ambulatory Visit: Payer: Self-pay | Admitting: Internal Medicine

## 2021-02-15 DIAGNOSIS — K219 Gastro-esophageal reflux disease without esophagitis: Secondary | ICD-10-CM

## 2021-02-15 MED ORDER — OMEPRAZOLE 40 MG PO CPDR: 40.0000 mg | DELAYED_RELEASE_CAPSULE | Freq: Every day | ORAL | 2 refills | Status: DC

## 2021-02-16 ENCOUNTER — Telehealth (INDEPENDENT_AMBULATORY_CARE_PROVIDER_SITE_OTHER): Payer: 59 | Admitting: Psychiatry

## 2021-02-16 ENCOUNTER — Other Ambulatory Visit: Payer: Self-pay

## 2021-02-16 ENCOUNTER — Encounter: Payer: Self-pay | Admitting: Psychiatry

## 2021-02-16 DIAGNOSIS — F3341 Major depressive disorder, recurrent, in partial remission: Secondary | ICD-10-CM

## 2021-02-16 DIAGNOSIS — F909 Attention-deficit hyperactivity disorder, unspecified type: Secondary | ICD-10-CM

## 2021-02-16 MED ORDER — BUPROPION HCL ER (XL) 300 MG PO TB24: 300.0000 mg | ORAL_TABLET | ORAL | 0 refills | Status: DC

## 2021-02-16 MED ORDER — LORAZEPAM 0.5 MG PO TABS: 0.5000 mg | ORAL_TABLET | Freq: Two times a day (BID) | ORAL | 1 refills | Status: DC | PRN

## 2021-02-16 NOTE — Progress Notes (Signed)
Virtual Visit via Video Note  I connected with Dawn Ortiz on 02/16/21 at  9:00 AM EDT by a video enabled telemedicine application and verified that I am speaking with the correct person using two identifiers.  Location: Patient: car Provider: office Persons participated in the visit- patient, provider    I discussed the limitations of evaluation and management by telemedicine and the availability of in person appointments. The patient expressed understanding and agreed to proceed.   I discussed the assessment and treatment plan with the patient. The patient was provided an opportunity to ask questions and all were answered. The patient agreed with the plan and demonstrated an understanding of the instructions.   The patient was advised to call back or seek an in-person evaluation if the symptoms worsen or if the condition fails to improve as anticipated.  I provided 17 minutes of non-face-to-face time during this encounter.   Neysa Hotter, MD    [facility] MD/PA/NP OP Progress Note  02/16/2021 9:52 AM Dawn Ortiz  MRN:  [id number]  Chief Complaint:  Chief Complaint   Follow-up; Depression; ADHD    HPI:  Dawn Ortiz is a 30 y.o. year old female with a history of depression, ADHD (no formal evaluation), type II diabetes, who is transferred from Dr Evelene Croon.   She states that she has been doing good.  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  Although she feels stressed at times, she has good days most of the time.  She states that she occasionally feels down.  She states that "it depends "on what is going on around that time.  She usually takes a nap or listen to music, and her mood improves afterwards.  She enjoys taking a vacation; she enjoys being out in the sun.  She enjoys time with her parents, brother and her boyfriend.  She reports good relationship with her boyfriend.  She does not have any concern about her current medication, and would like to stay the same way.    Depression-she feels down at times.  She sleeps 6 hours.  She occasionally does not feel refreshed in the morning.  She denies snoring.  She has fair energy.  She denies any change in appetite.  She denies SI.   Mania- she denies decreased need for sleep, euphonia.  She denies any psychotic symptoms.   ADHD-she was never diagnosed with ADHD until she saw Dr. Evelene Croon.  Although she did not need IEP, she struggled through school. She has issues with productivity when she does not take Adderall.  She takes it a few times per week when she does work, which requires more concentration.  She has difficulty in completing tasks, and doing multitasking.  She occasionally jumps from topic to topic.  She denies any issues with appointments or forgetfulness.  She is unable to sit still when she does not take Adderall.   Substance-she drinks 1 cocktail only occasionally.  She denies drug use.   Daily routine: work. Spends time with her family, boyfriend on weekend Exercise:once a week, walk, goes to gym Employment: Ship broker, dispatcher for 5.5 years Support: Household: boyfriend of 3 years Marital status: single Number of children: 0  Education: graduated from college, no IEP, but "struggled through it"   Visit Diagnosis:    ICD-10-CM   1. Attention deficit hyperactivity disorder (ADHD), unspecified ADHD type  F90.9 Drug Screen, Urine    2. MDD (major depressive disorder), recurrent, in partial remission (HCC)  F33.41 LORazepam (  ATIVAN) 0.5 MG tablet    buPROPion (WELLBUTRIN XL) 300 MG 24 hr tablet      Past Psychiatric History:  Outpatient:  Psychiatry admission: denies Previous suicide attempt: denies  Past trials of medication:  History of violence:    Past Medical History:  Past Medical History:  Diagnosis Date   Abscess of left breast    Abscess of left thigh    Abscess of right breast    Abscess of right thigh    Abscess of the breast and nipple 03/02/2019   Anxiety     Axillary abscess 07/02/2016   Biliary colic    Diabetes mellitus without complication (HCC)    GERD (gastroesophageal reflux disease)    Leukocytosis 07/19/2019   Migraines     Past Surgical History:  Procedure Laterality Date   CHOLECYSTECTOMY     ESOPHAGOGASTRODUODENOSCOPY (EGD) WITH PROPOFOL N/A 10/01/2019   Procedure: ESOPHAGOGASTRODUODENOSCOPY (EGD) WITH PROPOFOL;  Surgeon: Toney ReilVanga, Rohini Reddy, MD;  Location: [facility];  Service: Gastroenterology;  Laterality: N/A;   I & D EXTREMITY Left 03/05/2019   Procedure: Irrigation and debridement;  Surgeon: Henrene DodgePiscoya, Jose, MD;  Location: [facility];  Service: General;  Laterality: Left;   INCISION AND DRAINAGE     right breast x1 and left groin x1 and left axilla   INCISION AND DRAINAGE ABSCESS Left 07/02/2016   Procedure: INCISION AND DRAINAGE ABSCESS;  Surgeon: Lattie Hawichard E Cooper, MD;  Location: [facility];  Service: General;  Laterality: Left;   INCISION AND DRAINAGE ABSCESS Left 04/18/2017   Procedure: INCISION AND DRAINAGE ABSCESS-LEFT GROIN;  Surgeon: Ancil Linseyavis, [name], MD;  Location: [facility];  Service: General;  Laterality: Left;   INCISION AND DRAINAGE ABSCESS Right 05/21/2018   Procedure: INCISION AND DRAINAGE ABSCESS- RIGHT THIGH;  Surgeon: Henrene DodgePiscoya, Jose, MD;  Location: [facility];  Service: General;  Laterality: Right;   INCISION AND DRAINAGE ABSCESS Left 08/23/2020   Procedure: INCISION AND DRAINAGE ABSCESS, axillary;  Surgeon: Henrene DodgePiscoya, Jose, MD;  Location: [facility];  Service: General;  Laterality: Left;  provider requesting 2 hours /120 minutes for procedure   IRRIGATION AND DEBRIDEMENT ABSCESS Right 04/09/2018   Procedure: IRRIGATION AND DEBRIDEMENT ABSCESS;  Surgeon: Henrene DodgePiscoya, Jose, MD;  Location: [facility];  Service: General;  Laterality: Right;   IRRIGATION AND DEBRIDEMENT ABSCESS Left 05/21/2018   Procedure: IRRIGATION AND DEBRIDEMENT BREAST ABSCESS;  Surgeon: Henrene DodgePiscoya, Jose, MD;  Location: [facility];  Service: General;  Laterality: Left;    IRRIGATION AND DEBRIDEMENT ABSCESS Right 01/06/2020   Procedure: MINOR INCISION AND DRAINAGE OF ABSCESS, right axillary;  Surgeon: Campbell Lernerodenberg, Denny, MD;  Location: [facility];  Service: General;  Laterality: Right;   PILONIDAL CYST EXCISION  12/03/2014   Procedure: CYST EXCISION PILONIDAL EXTENSIVE;  Surgeon: Duwaine MaxinWilliam Marterre, MD;  Location: [facility];  Service: General;;    Family Psychiatric History:  Denies.   Family History:  Family History  Problem Relation Age of Onset   Healthy Mother    Hypertension Father    Diabetes Father    Heart disease Father    Sarcoidosis Father    Healthy Brother    Obesity Brother    Cancer Paternal Grandfather        colon and also siblings had cancer ?type   Hypertension Other     Social History:  Social History   Socioeconomic History   Marital status: Single    Spouse name: Not on file   Number of children: Not on file   Years of education:  Not on file   Highest education level: Not on file  Occupational History   Not on file  Tobacco Use   Smoking status: Former    Types: Cigarettes   Smokeless tobacco: Never  Vaping Use   Vaping Use: Never used  Substance and Sexual Activity   Alcohol use: Yes    Alcohol/week: 1.0 - 2.0 standard drink    Types: 1 - 2 Glasses of wine per week    Comment: none last 24hrs   Drug use: No   Sexual activity: Yes  Other Topics Concern   Not on file  Social History Narrative   DPR mom Geena Weinhold [phone]    Dad Klani Caridi [phone]    No kids    Works for [employer]   Social Determinants of Health   Financial Resource Strain: Not on file  Food Insecurity: Not on file  Transportation Needs: Not on file  Physical Activity: Not on file  Stress: Not on file  Social Connections: Not on file    Allergies:  Allergies  Allergen Reactions   Sulfa Antibiotics Hives    Metabolic Disorder Labs: Lab Results  Component Value Date   HGBA1C 8.9 (H) 07/19/2019   MPG 208.73 07/19/2019    No results found for: PROLACTIN Lab Results  Component Value Date   CHOL 127 02/26/2019   TRIG 96 02/26/2019   HDL 37 (L) 02/26/2019   CHOLHDL 3.4 02/26/2019   LDLCALC 71 02/26/2019   LDLCALC 94 10/21/2017   Lab Results  Component Value Date   TSH 0.773 02/26/2019   TSH 0.425 (L) 10/12/2016    Therapeutic Level Labs: No results found for: LITHIUM No results found for: VALPROATE No components found for:  CBMZ  Current Medications: Current Outpatient Medications  Medication Sig Dispense Refill   Adalimumab (HUMIRA PEN) 40 MG/0.4ML PNKT Inject 40 mg into the skin once a week. Starting on day 29. 2 each 4   Adalimumab (HUMIRA PEN-CD/UC/HS STARTER) 40 MG/0.8ML PNKT Inject 160 mg into the skin as directed. Inject 160 mg subcutaneous on day 1, 80 mg on day 15, then 40 mg once a week starting day 29. 1 each 0   amphetamine-dextroamphetamine (ADDERALL XR) 20 MG 24 hr capsule Take 2 capsules in the morning 60 capsule 0   amphetamine-dextroamphetamine (ADDERALL XR) 20 MG 24 hr capsule Take 2 capsules in the morning 60 capsule 0   amphetamine-dextroamphetamine (ADDERALL XR) 20 MG 24 hr capsule Take 2 capsules in the morning 60 capsule 0   B-D ULTRAFINE III SHORT PEN 31G X 8 MM MISC      [START ON 02/24/2021] buPROPion (WELLBUTRIN XL) 300 MG 24 hr tablet Take 1 tablet (300 mg total) by mouth every morning. 90 tablet 0   Clindamycin Phosphate foam Apply to affected areas 1-2 times daily as needed for outbreaks. 100 g 3   fluconazole (DIFLUCAN) 150 MG tablet Take 1 tablet (150 mg total) by mouth daily. 1 tablet 0   ibuprofen (ADVIL) 800 MG tablet Take 1 tablet (800 mg total) by mouth every 8 (eight) hours as needed. 60 tablet 1   Insulin Degludec (TRESIBA) 100 UNIT/ML SOLN Inject 20 Units into the skin daily.      Lancets (ONETOUCH DELICA PLUS LANCET33G) MISC      LORazepam (ATIVAN) 0.5 MG tablet Take 1 tablet (0.5 mg total) by mouth 2 (two) times daily as needed for anxiety. 30 tablet 1    metFORMIN (GLUCOPHAGE-XR) 500 MG 24  hr tablet Take 500 mg by mouth in the morning, at noon, and at bedtime.      omeprazole (PRILOSEC) 40 MG capsule Take 1 capsule (40 mg total) by mouth daily. 30 minutes before food 90 capsule 2   ondansetron (ZOFRAN) 4 MG tablet Take 1 tablet (4 mg total) by mouth every 8 (eight) hours as needed for nausea or vomiting. 20 tablet 0   ONETOUCH ULTRA test strip      Semaglutide,0.25 or 0.5MG /DOS, (OZEMPIC, 0.25 OR 0.5 MG/DOSE,) 2 MG/1.5ML SOPN Inject 1 mg into the skin once a week.      No current facility-administered medications for this visit.     Musculoskeletal: Strength & Muscle Tone:  N/A Gait & Station:  N/A Patient leans: N/A  Psychiatric Specialty Exam: Review of Systems  Psychiatric/Behavioral:  Positive for decreased concentration. Negative for agitation, behavioral problems, confusion, dysphoric mood, hallucinations, self-injury, sleep disturbance and suicidal ideas. The patient is nervous/anxious. The patient is not hyperactive.   All other systems reviewed and are negative.  Last menstrual period 01/24/2021.There is no height or weight on file to calculate BMI.  General Appearance: Fairly Groomed  Eye Contact:  Good  Speech:  Clear and Coherent  Volume:  Normal  Mood:   good  Affect:  Appropriate, Congruent, and euthymic  Thought Process:  Coherent  Orientation:  Full (Time, Place, and Person)  Thought Content: Logical   Suicidal Thoughts:  No  Homicidal Thoughts:  No  Memory:  Immediate;   Good  Judgement:  Good  Insight:  Good  Psychomotor Activity:  Normal  Concentration:  Concentration: Good and Attention Span: Good  Recall:  Good  Fund of Knowledge: Good  Language: Good  Akathisia:  No  Handed:  Right  AIMS (if indicated): not done  Assets:  Communication Skills Desire for Improvement  ADL's:  Intact  Cognition: WNL  Sleep:  Good   Screenings: GAD-7    Flowsheet Row Office Visit from 04/29/2020 in [facility]  Total GAD-7 Score 0      PHQ2-9    Flowsheet Row Video Visit from 02/16/2021 in [facility] Office Visit from 04/29/2020 in [facility]  PHQ-2 Total Score 1 0  PHQ-9 Total Score -- 0      Flowsheet Row ED from 01/31/2021 in [facility]  Admission (Discharged) from 08/23/2020 in [facility]  C-SSRS RISK CATEGORY No Risk No Risk        Assessment and Plan:  Dawn Ortiz is a 30 y.o. year old female with a history of depression, ADHD (no formal evaluation), type II diabetes, who is transferred from Dr Evelene Croon.   1. MDD (major depressive disorder), recurrent, in partial remission (HCC) Although she reports occasionally depressed mood symptoms without significant triggers, she has been handling things relatively well.  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  Will continue current dose of bupropion to target depression and an anxiety.  Will continue lorazepam as needed for anxiety.  Discussed potential risk of dependence and oversedation.   2. Attention deficit hyperactivity disorder (ADHD), unspecified ADHD type She has not done any neuropsychological evaluation, and has had good benefit from Adderall, described by Dr.Kaur.  Will continue current dose at this time.  Discussed potential risks, which includes but not limited to headache, insomnia, palpitation, anxiety and decreased appetite.  She agrees that the medication  will not be continued if any signs of misuse of medication or any drug use.  She agrees to do UDS.   Plan I have reviewed and updated plans as below  Continue bupropion 300 mg daily Continue lorazepam 0.5 mg twice a day as needed for anxiety  Continue Adderall 40 mg daily, she takes this 3-4 days per week- 2 orders left from Dr. Evelene Croon Next appointment- 9/13 at 9 AM, video  The patient  demonstrates the following risk factors for suicide: Chronic risk factors for suicide include: psychiatric disorder of depression . Acute risk factors for suicide include: N/A. Protective factors for this patient include: hope for the future. Considering these factors, the overall suicide risk at this point appears to be low. Patient is appropriate for outpatient follow up.    Neysa Hotter, MD 02/16/2021, 9:52 AM

## 2021-05-02 ENCOUNTER — Encounter: Payer: Managed Care, Other (non HMO) | Admitting: Internal Medicine

## 2021-05-04 ENCOUNTER — Encounter: Payer: Self-pay | Admitting: Surgery

## 2021-05-05 ENCOUNTER — Other Ambulatory Visit: Payer: Self-pay

## 2021-05-05 MED ORDER — FLUCONAZOLE 150 MG PO TABS: 150.0000 mg | ORAL_TABLET | Freq: Every day | ORAL | 1 refills | Status: DC

## 2021-05-05 MED ORDER — CIPROFLOXACIN HCL 500 MG PO TABS: 500.0000 mg | ORAL_TABLET | Freq: Two times a day (BID) | ORAL | 0 refills | Status: AC

## 2021-05-22 ENCOUNTER — Other Ambulatory Visit: Payer: Self-pay | Admitting: Dermatology

## 2021-05-24 ENCOUNTER — Encounter: Payer: Managed Care, Other (non HMO) | Admitting: Internal Medicine

## 2021-05-29 ENCOUNTER — Encounter: Payer: Self-pay | Admitting: Surgery

## 2021-05-29 ENCOUNTER — Telehealth: Payer: Self-pay

## 2021-05-29 MED ORDER — CIPROFLOXACIN HCL 500 MG PO TABS: 500.0000 mg | ORAL_TABLET | Freq: Two times a day (BID) | ORAL | 0 refills | Status: AC

## 2021-05-29 NOTE — Telephone Encounter (Signed)
Patient requested refill on Cipro- ok to refill per Dr.Piscoya- patient states she has appointment end of year with dermatology. Patient notified RX sent to pharmacy.

## 2021-06-02 ENCOUNTER — Ambulatory Visit
Admit: 2021-06-02 | Discharge: 2021-06-02 | Payer: PRIVATE HEALTH INSURANCE | Attending: Family | Primary: Family Medicine

## 2021-06-02 DIAGNOSIS — B349 Viral infection, unspecified: Secondary | ICD-10-CM

## 2021-06-02 LAB — AMB POC RAPID STREP TEST: Group A Strep Antigen, POC: NEGATIVE

## 2021-06-02 LAB — POC COVID-19 & INFLUENZA COMBO (LIAT IN HOUSE)
INFLUENZA A: NOT DETECTED
INFLUENZA B: NOT DETECTED
SARS-CoV-2: NOT DETECTED

## 2021-06-02 MED ORDER — OSELTAMIVIR PHOSPHATE 75 MG PO CAPS
75 MG | ORAL_CAPSULE | Freq: Two times a day (BID) | ORAL | 0 refills | Status: AC
Start: 2021-06-02 — End: 2021-06-07

## 2021-06-02 NOTE — Progress Notes (Signed)
Heidi Combs (DOB:  21-Sep-1990) is a 30 y.o. female, here for evaluation of the following chief complaint(s): Influenza (Last week had a sore throat, drainage, headache, flu exposure, Wed got worse with low fever, sores in throat, no appetite, tired, home covid test was neg. Doesn't feel like covid)           Subjective   SUBJECTIVE/OBJECTIVE:    30yo female presents with acute onset sore throat, postnasal drip, headache, low-grade fever, body aches and chills for the last 2 days after multiple known flu exposures at her job. Has been using OTC medications with some relief. Here today requesting Tamiflu    Allergies   Allergen Reactions    Cefaclor Hives     Other reaction(s): Unknown    Sumatriptan Swelling     Other reaction(s): shortness of breath  angioedema          Current Outpatient Medications on File Prior to Visit   Medication Sig Dispense Refill    norethindrone-ethinyl estradiol-iron (GILDESS FE 1.5/30) 1.5-30 MG-MCG tablet 1 tablet Orally Once a day      buPROPion (WELLBUTRIN SR) 100 MG extended release tablet 1 tablet in the morning Orally Once a day (Patient not taking: Reported on 06/02/2021)      escitalopram (LEXAPRO) 20 MG tablet 1 tablet Orally Once a day for 30 day(s)      FLUoxetine (PROZAC) 40 MG capsule 1 capsule in the morning Orally Once a day (Patient not taking: Reported on 06/02/2021)      HYDROcodone-acetaminophen (NORCO) 5-325 MG per tablet 1 tablet as needed Orally every 6 hrs (Patient not taking: Reported on 06/02/2021)      Levonorgest-Eth Estrad 91-Day (AMETHIA) 0.15-0.03 &0.01 MG TABS 1 tablet Orally Once a day for 91 day(s)      Levonorgest-Eth Estrad 91-Day (CAMRESE LO) 0.1-0.02 & 0.01 MG TABS 1 tablet Orally Once a day for 91 day(s)      LORazepam (ATIVAN) 0.5 MG tablet 1 tablet at bedtime as needed Orally Once a day (Patient not taking: Reported on 06/02/2021)      naproxen sodium (ANAPROX) 220 MG tablet 1 tablet with food or milk as needed Orally every 12 hrs  (Patient not taking: Reported on 06/02/2021)      tiZANidine (ZANAFLEX) 4 MG tablet 1/2-1 tablet as needed Orally Once nightly (Patient not taking: Reported on 06/02/2021)       No current facility-administered medications on file prior to visit.            History reviewed. No pertinent past medical history.     History reviewed. No pertinent surgical history.     Social History     Tobacco Use    Smoking status: Never    Smokeless tobacco: Never        Review of Systems  See HPI; otherwise ROS negative.         Objective   BP 106/71    Pulse 77    Temp 98.4 ??F (36.9 ??C)    Resp 13    Wt 164 lb (74.4 kg)    SpO2 98%    Physical Exam  Vitals and nursing note reviewed.   Constitutional:       General: She is not in acute distress.     Appearance: Normal appearance.   HENT:      Head: Normocephalic and atraumatic.      Nose: No congestion.      Mouth/Throat:  Mouth: Mucous membranes are moist.      Pharynx: No oropharyngeal exudate or posterior oropharyngeal erythema.   Eyes:      Pupils: Pupils are equal, round, and reactive to light.   Cardiovascular:      Rate and Rhythm: Normal rate and regular rhythm.   Pulmonary:      Effort: Pulmonary effort is normal.      Breath sounds: Normal breath sounds.   Skin:     General: Skin is warm and dry.   Neurological:      General: No focal deficit present.      Mental Status: She is alert and oriented to person, place, and time.   Psychiatric:         Mood and Affect: Mood normal.         Behavior: Behavior normal.         Thought Content: Thought content normal.         Judgment: Judgment normal.     No scans are attached to the encounter.  Results for orders placed or performed in visit on 06/02/21   POC Strep A AG, EIA (Rapid Strep) (27741)   Result Value Ref Range    Valid Internal Control, POC      Group A Strep Antigen, POC Negative Negative             ASSESSMENT/PLAN:  1. Viral illness  -     oseltamivir (TAMIFLU) 75 MG capsule; Take 1 capsule by mouth in the morning  and 1 capsule in the evening. Do all this for 5 days., Disp-10 capsule, R-0Normal  2. Sore throat  -     COVID-19 & Influenza Combo ([facility])  -     POC Strep A AG, EIA (Rapid Strep) (28786)    Patient is clinically positive for Influenza today. Start Rx as above. Push fluids, rest. Return promptly with any worsening respiratory symptoms. Can use additional OTC medications PRN.   Patient to call our office or return with any questions, concerns or worsening signs or symptoms.    An electronic signature was used to authenticate this note.    --Deatra Canter, APRN - NP

## 2021-06-07 MED ORDER — VALACYCLOVIR HCL 1 G PO TABS
1 g | ORAL_TABLET | Freq: Two times a day (BID) | ORAL | 2 refills | Status: DC
Start: 2021-06-07 — End: 2021-12-14

## 2021-06-07 NOTE — Telephone Encounter (Signed)
Medication and/or diagnosis excluded from protocol, sent to office to fill

## 2021-06-07 NOTE — Telephone Encounter (Signed)
Patient is requesting refill  What pharmacy would you like this sent to?  [facility]   Have there been any medication changes since your last visit?  No   Medication:valtrex  Dosage:1 gm tablet  Frequency:2 tablets every 12 hours  Quantity:  *patient is having a cold sore outbreak     Was an appointment offered? Yes - not at the moment  If the patient declined please state why.

## 2021-06-07 NOTE — Telephone Encounter (Signed)
Pls es

## 2021-07-06 ENCOUNTER — Ambulatory Visit
Admit: 2021-07-06 | Discharge: 2021-07-06 | Payer: PRIVATE HEALTH INSURANCE | Attending: Family | Primary: Family Medicine

## 2021-07-06 DIAGNOSIS — R52 Pain, unspecified: Secondary | ICD-10-CM

## 2021-07-06 LAB — POC COVID-19 & INFLUENZA COMBO (LIAT IN HOUSE)
INFLUENZA A: NOT DETECTED
INFLUENZA B: NOT DETECTED
SARS-CoV-2: NOT DETECTED

## 2021-07-06 NOTE — Progress Notes (Signed)
Subjective:      Patient ID: Heidi Combs       Generalized Body Aches  Associated symptoms: fatigue, fever and headaches    Headache  Fever   Associated symptoms include headaches.   Fatigue  Associated symptoms: fatigue, fever and headaches   The patient is a 30 y.o. female who presents to express care requesting COVID and flu testing.  She said yesterday morning her symptoms started and she woke up with night sweats and chills.  She has a slight runny nose but no significant cough or sore throat.  She is having low-grade fevers as well as fatigue and body aches.  She says in her office there have been people that have tested positive for both COVID and the flu.  She denies any earache.  She has no vomiting or diarrhea but does have nausea.    Review of Systems   Constitutional:  Positive for fatigue and fever.   Neurological:  Positive for headaches. : Otherwise negative and as noted in the HPI    No past medical history on file.     No past surgical history on file.     Allergies   Allergen Reactions    Cefaclor Hives     Other reaction(s): Unknown    Sumatriptan Swelling     Other reaction(s): shortness of breath  angioedema         Current Outpatient Medications   Medication Sig Dispense Refill    escitalopram (LEXAPRO) 20 MG tablet 1 tablet Orally Once a day for 30 day(s)      Levonorgest-Eth Estrad 91-Day 0.15-0.03 &0.01 MG TABS 1 tablet Orally Once a day for 91 day(s)      Methylphenidate HCl ER 18 MG TB24 Take 18 mg by mouth every morning. (Patient not taking: Reported on 07/06/2021)      acyclovir (ZOVIRAX) 400 MG tablet Take 400 mg by mouth 3 times daily (Patient not taking: Reported on 07/06/2021)      valACYclovir (VALTREX) 1 g tablet Take 1 tablet by mouth 2 times daily (Patient not taking: Reported on 07/06/2021) 4 tablet 2    norethindrone-ethinyl estradiol-iron (GILDESS FE 1.5/30) 1.5-30 MG-MCG tablet 1 tablet Orally Once a day      buPROPion (WELLBUTRIN SR) 100 MG extended release tablet 1  tablet in the morning Orally Once a day (Patient not taking: Reported on 06/02/2021)      FLUoxetine (PROZAC) 40 MG capsule 1 capsule in the morning Orally Once a day (Patient not taking: Reported on 06/02/2021)      HYDROcodone-acetaminophen (NORCO) 5-325 MG per tablet 1 tablet as needed Orally every 6 hrs (Patient not taking: Reported on 06/02/2021)      Levonorgest-Eth Estrad 91-Day 0.1-0.02 & 0.01 MG TABS 1 tablet Orally Once a day for 91 day(s) (Patient not taking: Reported on 07/06/2021)      LORazepam (ATIVAN) 0.5 MG tablet 1 tablet at bedtime as needed Orally Once a day (Patient not taking: Reported on 06/02/2021)      naproxen sodium (ANAPROX) 220 MG tablet 1 tablet with food or milk as needed Orally every 12 hrs (Patient not taking: Reported on 06/02/2021)      tiZANidine (ZANAFLEX) 4 MG tablet 1/2-1 tablet as needed Orally Once nightly (Patient not taking: Reported on 06/02/2021)       No current facility-administered medications for this visit.        BP 111/74    Pulse 82    Temp 98.3 ??F (  36.8 ??C)    Resp 16    SpO2 98%      Objective:   Physical Exam  Constitutional:       Appearance: Normal appearance.   HENT:      Head: Normocephalic and atraumatic.      Right Ear: Tympanic membrane and ear canal normal.      Left Ear: Tympanic membrane and ear canal normal.      Nose: Rhinorrhea present.      Mouth/Throat:      Mouth: Mucous membranes are moist.      Pharynx: No posterior oropharyngeal erythema.   Eyes:      Conjunctiva/sclera: Conjunctivae normal.   Cardiovascular:      Rate and Rhythm: Normal rate and regular rhythm.      Heart sounds: Normal heart sounds.   Pulmonary:      Effort: Pulmonary effort is normal.      Breath sounds: Normal breath sounds.   Lymphadenopathy:      Cervical: No cervical adenopathy.   Skin:     General: Skin is warm and dry.   Neurological:      Mental Status: She is alert and oriented to person, place, and time.   Psychiatric:         Mood and Affect: Mood normal.          Behavior: Behavior normal.         Thought Content: Thought content normal.         Judgment: Judgment normal.       @SCANIMAGES @    Assessment:   1. Body aches  -     POC COVID-19 & Influenza Combo ([facility])  2. Viral URI        Plan:   No orders of the defined types were placed in this encounter.      Results for orders placed or performed in visit on 07/06/21   POC COVID-19 & Influenza Combo ([facility])   Result Value Ref Range    SARS-CoV-2 Not Detected Not Detected    INFLUENZA A Not Detected Not Detected    INFLUENZA B Not Detected Not Detected    Narrative    Is this test for diagnosis or screening?->Diagnosis of ill patient  Symptomatic for COVID-19 as defined by CDC?->Unknown  Date of Symptom Onset->N/A  Hospitalized for COVID-19?->Unknown  Admitted to ICU for COVID-19?->Unknown  Employed in healthcare setting?->Unknown  Resident in a congregate (group) care setting?->Unknown  Pregnant?->Unknown  Previously tested for COVID-19?->Unknown          Medication List            Accurate as of July 06, 2021  8:52 AM. If you have any questions, ask your nurse or doctor.                CONTINUE taking these medications      acyclovir 400 MG tablet  Commonly known as: ZOVIRAX     buPROPion 100 MG extended release tablet  Commonly known as: WELLBUTRIN SR     escitalopram 20 MG tablet  Commonly known as: LEXAPRO     FLUoxetine 40 MG capsule  Commonly known as: PROzac     Gildess FE 1.5/30 1.5-30 MG-MCG tablet  Generic drug: norethindrone-ethinyl estradiol-iron     HYDROcodone-acetaminophen 5-325 MG per tablet  Commonly known as: NORCO     * Levonorgest-Eth Estrad 91-Day 0.15-0.03 &0.01 MG Tabs     * Levonorgest-Eth Estrad 91-Day 0.1-0.02 &  0.01 MG Tabs     LORazepam 0.5 MG tablet  Commonly known as: ATIVAN     Methylphenidate HCl ER 18 MG Tb24     naproxen sodium 220 MG tablet  Commonly known as: ANAPROX     tiZANidine 4 MG tablet  Commonly known as: ZANAFLEX     valACYclovir 1 g tablet  Commonly known as:  VALTREX  Take 1 tablet by mouth 2 times daily           * This list has 2 medication(s) that are the same as other medications prescribed for you. Read the directions carefully, and ask your doctor or other care provider to review them with you.                     Repeat in symptoms are very suspicious for flu or COVID-like illness.  We discussed contagiousness and to be on the safe side I would recommend at least a 5-day quarantine when her symptoms started.  A work excuse was given and would recommend over-the-counter medicine for supportive care.  Recommend follow-up for any significantly worsening symptoms or concerns.  Patient voiced understanding and is comfortable with this plan.        Leafy Ro, APRN - NP

## 2021-08-01 ENCOUNTER — Encounter: Payer: Self-pay | Admitting: Dermatology

## 2021-08-01 ENCOUNTER — Encounter: Payer: Self-pay | Admitting: Internal Medicine

## 2021-08-01 ENCOUNTER — Ambulatory Visit (INDEPENDENT_AMBULATORY_CARE_PROVIDER_SITE_OTHER): Payer: Managed Care, Other (non HMO) | Admitting: Internal Medicine

## 2021-08-01 ENCOUNTER — Other Ambulatory Visit: Payer: Self-pay

## 2021-08-01 VITALS — BP 122/80 | HR 95 | Temp 97.8°F | Ht 62.91 in | Wt 181.8 lb

## 2021-08-01 DIAGNOSIS — E119 Type 2 diabetes mellitus without complications: Secondary | ICD-10-CM

## 2021-08-01 DIAGNOSIS — L732 Hidradenitis suppurativa: Secondary | ICD-10-CM

## 2021-08-01 DIAGNOSIS — E559 Vitamin D deficiency, unspecified: Secondary | ICD-10-CM

## 2021-08-01 DIAGNOSIS — K219 Gastro-esophageal reflux disease without esophagitis: Secondary | ICD-10-CM

## 2021-08-01 DIAGNOSIS — Z0000 Encounter for general adult medical examination without abnormal findings: Secondary | ICD-10-CM

## 2021-08-01 DIAGNOSIS — Z23 Encounter for immunization: Secondary | ICD-10-CM | POA: Diagnosis not present

## 2021-08-01 DIAGNOSIS — Z1329 Encounter for screening for other suspected endocrine disorder: Secondary | ICD-10-CM

## 2021-08-01 MED ORDER — OMEPRAZOLE 40 MG PO CPDR: 40.0000 mg | DELAYED_RELEASE_CAPSULE | Freq: Every day | ORAL | 3 refills | Status: DC

## 2021-08-01 NOTE — Progress Notes (Signed)
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  HENT:  Negative for hearing loss.   Eyes:  Negative for blurred vision.  Respiratory:  Negative for shortness of breath.   Cardiovascular:  Negative for chest pain.  Gastrointestinal:  Negative for abdominal pain and blood in stool.  Genitourinary:  Negative for dysuria.  Musculoskeletal:  Negative for falls and joint pain.  Skin:  Negative for rash.  Neurological:  Negative for headaches.  Psychiatric/Behavioral:  Negative for depression.   Past Medical History:  Diagnosis Date   Abscess of left breast    Abscess of left thigh    Abscess of right breast    Abscess of right thigh    Abscess of the breast and nipple 03/02/2019   Anxiety    Axillary abscess 07/02/2016   Biliary colic    Diabetes mellitus without complication (HCC)    GERD (gastroesophageal reflux disease)    Leukocytosis 07/19/2019   Migraines    Past Surgical History:  Procedure Laterality Date   CHOLECYSTECTOMY     ESOPHAGOGASTRODUODENOSCOPY (EGD) WITH PROPOFOL N/A 10/01/2019   Procedure: ESOPHAGOGASTRODUODENOSCOPY (EGD) WITH PROPOFOL;  Surgeon: Toney Reil, MD;  Location: [facility];  Service: Gastroenterology;  Laterality: N/A;   I & D EXTREMITY Left 03/05/2019   Procedure: Irrigation and debridement;  Surgeon: Henrene Dodge, MD;  Location: [facility];  Service: General;  Laterality: Left;   INCISION AND DRAINAGE     right breast x1 and left groin x1 and left axilla   INCISION AND DRAINAGE ABSCESS Left 07/02/2016   Procedure: INCISION AND DRAINAGE ABSCESS;  Surgeon: Lattie Haw, MD;  Location: [facility];  Service: General;  Laterality: Left;   INCISION AND DRAINAGE ABSCESS Left 04/18/2017   Procedure: INCISION AND DRAINAGE ABSCESS-LEFT GROIN;   Surgeon: Ancil Linsey, MD;  Location: [facility];  Service: General;  Laterality: Left;   INCISION AND DRAINAGE ABSCESS Right 05/21/2018   Procedure: INCISION AND DRAINAGE ABSCESS- RIGHT THIGH;  Surgeon: Henrene Dodge, MD;  Location: [facility];  Service: General;  Laterality: Right;   INCISION AND DRAINAGE ABSCESS Left 08/23/2020   Procedure: INCISION AND DRAINAGE ABSCESS, axillary;  Surgeon: Henrene Dodge, MD;  Location: [facility];  Service: General;  Laterality: Left;  provider requesting 2 hours /120 minutes for procedure   IRRIGATION AND DEBRIDEMENT ABSCESS Right 04/09/2018   Procedure: IRRIGATION AND DEBRIDEMENT ABSCESS;  Surgeon: Henrene Dodge, MD;  Location: [facility];  Service: General;  Laterality: Right;   IRRIGATION AND DEBRIDEMENT ABSCESS Left 05/21/2018   Procedure: IRRIGATION AND DEBRIDEMENT BREAST ABSCESS;  Surgeon: Henrene Dodge, MD;  Location: [facility];  Service: General;  Laterality: Left;   IRRIGATION AND DEBRIDEMENT ABSCESS Right 01/06/2020   Procedure: MINOR INCISION AND DRAINAGE OF ABSCESS, right axillary;  Surgeon: Campbell Lerner, MD;  Location: [facility];  Service: General;  Laterality: Right;   PILONIDAL CYST EXCISION  12/03/2014   Procedure: CYST EXCISION PILONIDAL EXTENSIVE;  Surgeon: Duwaine Maxin, MD;  Location: [facility];  Service: General;;   Family History  Problem Relation Age of Onset   Healthy Mother    Hypertension Father    Diabetes Father    Heart disease Father    Sarcoidosis Father    Healthy Brother  Obesity Brother    Cancer Paternal Grandfather        colon and also siblings had cancer ?type   Hypertension Other    Social History   Socioeconomic History   Marital status: Single    Spouse name: Not on file   Number of children: Not on file   Years of education: Not on file   Highest education level: Not on file  Occupational History   Not on file  Tobacco Use   Smoking status: Former    Types: Cigarettes   Smokeless tobacco: Never   Vaping Use   Vaping Use: Never used  Substance and Sexual Activity   Alcohol use: Yes    Alcohol/week: 1.0 - 2.0 standard drink    Types: 1 - 2 Glasses of wine per week    Comment: none last 24hrs   Drug use: No   Sexual activity: Yes  Other Topics Concern   Not on file  Social History Narrative   DPR mom Georgia DomMaria Nack [phone]    Dad Vincent PeyerRonnie Escareno [phone]    No kids    Works for [employer]   Social Determinants of Health   Financial Resource Strain: Not on file  Food Insecurity: Not on file  Transportation Needs: Not on file  Physical Activity: Not on file  Stress: Not on file  Social Connections: Not on file  Intimate Partner Violence: Not on file   Current Meds  Medication Sig   amphetamine-dextroamphetamine (ADDERALL XR) 20 MG 24 hr capsule Take 2 capsules in the morning   amphetamine-dextroamphetamine (ADDERALL XR) 20 MG 24 hr capsule Take 2 capsules in the morning   amphetamine-dextroamphetamine (ADDERALL XR) 20 MG 24 hr capsule Take 2 capsules in the morning   B-D ULTRAFINE III SHORT PEN 31G X 8 MM MISC    Clindamycin Phosphate foam Apply to affected areas 1-2 times daily as needed for outbreaks.   ibuprofen (ADVIL) 800 MG tablet Take 1 tablet (800 mg total) by mouth every 8 (eight) hours as needed.   Insulin Degludec (TRESIBA) 100 UNIT/ML SOLN Inject 20 Units into the skin daily.    Lancets (ONETOUCH DELICA PLUS LANCET33G) MISC    LORazepam (ATIVAN) 0.5 MG tablet Take 1 tablet (0.5 mg total) by mouth 2 (two) times daily as needed for anxiety.   metFORMIN (GLUCOPHAGE-XR) 500 MG 24 hr tablet Take 500 mg by mouth in the morning, at noon, and at bedtime.    ONETOUCH ULTRA test strip    Semaglutide,0.25 or 0.5MG /DOS, (OZEMPIC, 0.25 OR 0.5 MG/DOSE,) 2 MG/1.5ML SOPN Inject 1 mg into the skin once a week.    [DISCONTINUED] omeprazole (PRILOSEC) 40 MG capsule Take 1 capsule (40 mg total) by mouth daily. 30 minutes before food   Allergies  Allergen Reactions    Sulfa Antibiotics Hives   No results found for this or any previous visit (from the past 2160 hour(s)). Objective  Body mass index is 32.29 kg/m. Wt Readings from Last 3 Encounters:  08/01/21 181 lb 12.8 oz (82.5 kg)  09/30/20 175 lb (79.4 kg)  09/07/20 175 lb 6.4 oz (79.6 kg)   Temp Readings from Last 3 Encounters:  08/01/21 97.8 F (36.6 C) (Temporal)  01/31/21 98.9 F (37.2 C) (Oral)  09/30/20 98.9 F (37.2 C)   BP Readings from Last 3 Encounters:  08/01/21 122/80  01/31/21 129/88  09/30/20 138/81   Pulse Readings from Last 3 Encounters:  08/01/21 95  01/31/21 95  09/30/20 Marland Kitchen(!)  109    Physical Exam Vitals and nursing note reviewed.  Constitutional:      Appearance: Normal appearance. She is well-developed and well-groomed.  HENT:     Head: Normocephalic and atraumatic.  Eyes:     Conjunctiva/sclera: Conjunctivae normal.     Pupils: Pupils are equal, round, and reactive to light.  Cardiovascular:     Rate and Rhythm: Normal rate and regular rhythm.     Heart sounds: Normal heart sounds. No murmur heard. Pulmonary:     Effort: Pulmonary effort is normal.     Breath sounds: Normal breath sounds.  Abdominal:     General: Abdomen is flat. Bowel sounds are normal.     Tenderness: There is no abdominal tenderness.  Musculoskeletal:        General: No tenderness.  Skin:    General: Skin is warm and dry.  Neurological:     General: No focal deficit present.     Mental Status: She is alert and oriented to person, place, and time. Mental status is at baseline.     Cranial Nerves: Cranial nerves 2-12 are intact.     Gait: Gait is intact.  Psychiatric:        Attention and Perception: Attention and perception normal.        Mood and Affect: Mood and affect normal.        Speech: Speech normal.        Behavior: Behavior normal. Behavior is cooperative.        Thought Content: Thought content normal.        Cognition and Memory: Cognition and memory normal.         Judgment: Judgment normal.    Assessment  Plan  Annual physical exam - Plan: Comprehensive metabolic panel, Lipid panel, Hemoglobin A1c, CBC with Differential/Platelet, Urinalysis, Routine w reflex microscopic, Microalbumin / creatinine urine ratio Needs flu shot - Plan: Flu Vaccine QUAD 6+ mos PF IM (Fluarix Quad PF) See below    Vitamin D deficiency - Plan: Vitamin D (25 hydroxy)  Diabetes mellitus without complication (HCC) - Plan: Comprehensive metabolic panel, Lipid panel, Hemoglobin A1c 7.7  ozempic 2 mg weekly and metformin xr 500 mg and tresiba 35-40 units  [facility]   Gastroesophageal reflux disease - Plan: omeprazole (PRILOSEC) 40 MG capsule   HM Flu shot today  moderna 2/2 +1 total 3 my chart card  Pneumonia 23 consider call back to sch  Tdap check ncir for vaccines 2017/18     Pap 10/21/17 normal [facility]   Mammogram age 82 or baseline 35-39   Colonoscopy 45   rec healthy diet and exercise   Dermatology 08/2021 [facility]     Provider: Dr. French Ana McLean-Scocuzza-Internal Medicine

## 2021-08-01 NOTE — Patient Instructions (Addendum)
Date Type Department Care Team Description  12/16/2020 Office Visit [facility]   [address]   [place], [place] [id number]   [phone]   Toni Arthurs, [address]   [place], [place]  [address]   [phone] (Work)   [phone] (Fax)   Type 2 diabetes mellitus with microalbuminuria, with long-term current use of insulin (CMS-HCC)  Call for appt endocrine   [facility]   [place], [place] [address]   [place], [place]   [place], [place] [address]   [phone]   [phone] (Fax)    Call for appt ob/gyn   Dr. Melodie Bouillon dermatology

## 2021-08-02 ENCOUNTER — Encounter: Payer: Self-pay | Admitting: Internal Medicine

## 2021-08-02 NOTE — Telephone Encounter (Signed)
Please advise on request for medication. 

## 2021-08-04 MED ORDER — DOXYCYCLINE HYCLATE 100 MG PO TABS: 100.0000 mg | ORAL_TABLET | Freq: Two times a day (BID) | ORAL | 0 refills | Status: DC

## 2021-08-04 NOTE — Addendum Note (Signed)
Addended by: Quentin Ore on: 08/04/2021 05:24 PM   Modules accepted: Orders

## 2021-08-10 ENCOUNTER — Ambulatory Visit
Admit: 2021-08-10 | Discharge: 2021-08-10 | Payer: PRIVATE HEALTH INSURANCE | Attending: Family | Primary: Family Medicine

## 2021-08-10 ENCOUNTER — Encounter
Admit: 2021-08-10 | Discharge: 2021-08-10 | Payer: PRIVATE HEALTH INSURANCE | Attending: Family Medicine | Primary: Family Medicine

## 2021-08-10 DIAGNOSIS — 1 ERRONEOUS ENCOUNTER ICD10: Secondary | ICD-10-CM

## 2021-08-10 DIAGNOSIS — R051 Acute cough: Secondary | ICD-10-CM

## 2021-08-10 LAB — POC COVID-19 & INFLUENZA COMBO (LIAT IN HOUSE)
INFLUENZA A: NOT DETECTED
INFLUENZA B: NOT DETECTED
SARS-CoV-2: DETECTED — AB

## 2021-08-10 NOTE — Progress Notes (Signed)
This encounter was created in error - please disregard.     Patient had flu/COVID like symptoms so I advised she see our urgent care as they have rapid covid testing and walked her down to the clinic.

## 2021-08-10 NOTE — Progress Notes (Signed)
HPI:    Heidi Combs is a 31 y.o. female here today with Other (Severe nasal congestion, runny nose, cough, sore throat, sinus headache, body aches, ears hurts x Tues)  . She endorses sudden onset of symptoms Tuesday evening for which she has been taking Mucinex and Tylenol withiut relief. Denies fever/chills, SOB, wheezing, known sick contacts.  Vaccines UTD.    Review of Systems   Constitutional:  Positive for fatigue.   HENT:  Positive for congestion and sinus pain.    Respiratory:  Positive for cough.    All other systems reviewed and are negative.     Current Outpatient Medications   Medication Sig Dispense Refill    acyclovir (ZOVIRAX) 400 MG tablet Take 400 mg by mouth 3 times daily      escitalopram (LEXAPRO) 20 MG tablet 1 tablet Orally Once a day for 30 day(s)      Levonorgest-Eth Estrad 91-Day 0.15-0.03 &0.01 MG TABS 1 tablet Orally Once a day for 91 day(s)      Methylphenidate HCl ER 18 MG TB24 Take 18 mg by mouth every morning. (Patient not taking: No sig reported)      valACYclovir (VALTREX) 1 g tablet Take 1 tablet by mouth 2 times daily (Patient not taking: No sig reported) 4 tablet 2    norethindrone-ethinyl estradiol-iron (MICROGESTIN FE1.5/30) 1.5-30 MG-MCG tablet 1 tablet Orally Once a day (Patient not taking: Reported on 08/10/2021)      buPROPion (WELLBUTRIN SR) 100 MG extended release tablet 1 tablet in the morning Orally Once a day (Patient not taking: No sig reported)      FLUoxetine (PROZAC) 40 MG capsule 1 capsule in the morning Orally Once a day (Patient not taking: No sig reported)      HYDROcodone-acetaminophen (NORCO) 5-325 MG per tablet 1 tablet as needed Orally every 6 hrs (Patient not taking: No sig reported)      Levonorgest-Eth Estrad 91-Day 0.1-0.02 & 0.01 MG TABS 1 tablet Orally Once a day for 91 day(s) (Patient not taking: No sig reported)      LORazepam (ATIVAN) 0.5 MG tablet 1 tablet at bedtime as needed Orally Once a day (Patient not taking: No sig reported)       naproxen sodium (ANAPROX) 220 MG tablet 1 tablet with food or milk as needed Orally every 12 hrs (Patient not taking: No sig reported)      tiZANidine (ZANAFLEX) 4 MG tablet 1/2-1 tablet as needed Orally Once nightly (Patient not taking: No sig reported)       No current facility-administered medications for this visit.        Allergies   Allergen Reactions    Cefaclor Hives     Other reaction(s): Unknown    Sumatriptan Swelling     Other reaction(s): shortness of breath  angioedema          History reviewed. No pertinent past medical history.     No past surgical history on file.     No family history on file.     Social History     Socioeconomic History    Marital status: Unknown     Spouse name: Not on file    Number of children: Not on file    Years of education: Not on file    Highest education level: Not on file   Occupational History    Not on file   Tobacco Use    Smoking status: Never    Smokeless tobacco: Never  Substance and Sexual Activity    Alcohol use: Not on file    Drug use: Not on file    Sexual activity: Not on file   Other Topics Concern    Not on file   Social History Narrative    Not on file     Social Determinants of Health     Financial Resource Strain: Not on file   Food Insecurity: Not on file   Transportation Needs: Not on file   Physical Activity: Not on file   Stress: Not on file   Social Connections: Not on file   Intimate Partner Violence: Not on file   Housing Stability: Not on file        BP 110/74 (Site: Left Upper Arm, Position: Sitting)    Pulse 94    Temp 99.3 ??F (37.4 ??C)    Wt 168 lb 9.6 oz (76.5 kg)    SpO2 98%      Physical Exam  Vitals and nursing note reviewed.   Constitutional:       General: She is not in acute distress.     Appearance: Normal appearance. She is normal weight. She is not ill-appearing or toxic-appearing.   HENT:      Head: Normocephalic and atraumatic.      Right Ear: Tympanic membrane, ear canal and external ear normal.      Left Ear: Tympanic membrane,  ear canal and external ear normal.      Nose: Nose normal.      Mouth/Throat:      Pharynx: Posterior oropharyngeal erythema present.   Eyes:      Pupils: Pupils are equal, round, and reactive to light.   Cardiovascular:      Rate and Rhythm: Normal rate and regular rhythm.   Pulmonary:      Effort: Pulmonary effort is normal.      Breath sounds: Normal breath sounds.   Lymphadenopathy:      Cervical: No cervical adenopathy.   Skin:     General: Skin is warm and dry.      Capillary Refill: Capillary refill takes less than 2 seconds.   Neurological:      Mental Status: She is alert.        Baylyn was seen today for other.    Diagnoses and all orders for this visit:    Acute cough  -     POC COVID-19 & Influenza Combo ([facility]); Future  -     POC COVID-19 & Influenza Combo ([facility])    COVID-19   Patient is positive for COVID19, they are to isolate at home for 5 days at which time if they are asymptomatic they may stop quarantine and wear a mask for 5 days. If fever remains they are to continue quarantine until the fever resolves without the use of fever reducing medications. Patient should treat their symptoms with OTC Tylenol 650mg  PO q 6 HOURS prn fever/aches, Delsym PRN cough, warm salt water gargles PRN sore throat, rest and super hydration. If symptoms are worsening such as persistent fever > 101F, SOB, dyspnea seek emergency care or contact PCP.     Return if symptoms worsen or fail to improve.           , APRN - NP

## 2021-08-10 NOTE — Telephone Encounter (Signed)
-----   Message from Delorse Limber, APRN - NP sent at 08/10/2021 10:42 AM EST -----  Please inform patient of + COVID results

## 2021-08-10 NOTE — Patient Instructions (Signed)
Patient is positive for COVID19, they are to isolate at home for 5 days at which time if they are asymptomatic they may stop quarantine and wear a mask for 5 days. If fever remains they are to continue quarantine until the fever resolves without the use of fever reducing medications. Patient should treat their symptoms with OTC Tylenol 650mg PO q 6 HOURS prn fever/aches, Delsym PRN cough, warm salt water gargles PRN sore throat, rest and super hydration. If symptoms are worsening such as persistent fever > 101F, SOB, dyspnea seek emergency care or contact PCP.

## 2021-08-22 LAB — HM DIABETES EYE EXAM

## 2021-09-12 ENCOUNTER — Ambulatory Visit: Payer: Managed Care, Other (non HMO) | Admitting: Dermatology

## 2021-11-07 ENCOUNTER — Encounter: Payer: Self-pay | Admitting: Internal Medicine

## 2021-11-17 ENCOUNTER — Ambulatory Visit: Payer: Managed Care, Other (non HMO) | Admitting: Internal Medicine

## 2021-12-14 ENCOUNTER — Encounter: Payer: Self-pay | Admitting: Internal Medicine

## 2021-12-14 MED ORDER — VALACYCLOVIR HCL 1 G PO TABS
1 g | ORAL_TABLET | ORAL | 2 refills | Status: AC
Start: 2021-12-14 — End: 2022-10-15

## 2021-12-14 NOTE — Telephone Encounter (Signed)
valACYclovir (VALTREX) 1 g tablet, Take 1 tablet by mouth 2 times daily    Please send to [id number] on file

## 2021-12-14 NOTE — Telephone Encounter (Signed)
MEDICATION CLASS PROHIBITS ME FROM ESCRIBING

## 2021-12-28 NOTE — Progress Notes (Signed)
BH MD/PA/NP OP Progress Note  01/01/2022 9:03 AM Dawn Ortiz  MRN:  [id number]  Chief Complaint:  Chief Complaint  Patient presents with   Follow-up   HPI:  - She is not see since July 2022 (used to be seen by Dr. Evelene Croon previous to that appointment).   She states that she used to be seen by another psychiatrist online until lately.  She did not feel it was a good fit.  She has not taken bupropion for several months, and feels she needs to be back on the medication.  She states that she tends to stop taking medication when she is doing good as she also needs to take other medication.  She thinks she has been doing well.  She reports good relationship with her fianc, her parents.  She meets with her parents every week.  She also enjoyed the time with her sister last weekend.  She goes to church at times.  She has started to work with this therapist in [place], every 3 weeks.  They have been trying to explore her anxiety, and is hoping to work on skills when she feels anxiety.  She states that she has been struggling with anxiety since college.  She cannot think of any specific reason.  She tends to feel tense, has racing thoughts, although it has been more manageable lately.  She takes lorazepam once or twice in the month.  She tends to have more anxiety on the highway or in social situation.  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 She feels down at times. She has occasional insomnia.  She snores at time.  She thinks her anxiety is much worse than depression (anxiety is 60%), and is not willing to try other medication instead of bupropion.   ADHD-she reports difficulty in attention since high school.  She denies IEP.  She suffered due to her difficulty in concentration since that time.  She has difficulty in sustaining  attention, doing multitasking.  She tends to be forgetful, and misplacing things.  She tends to be late for the appointment.  She takes Adderall 20 mg few times per week.  She prefers immediate release as she does not want the medication to affect her sleep when she were to take medication later in the day.  There are some days she can function relatively well without taking Adderall, although she is more productive when she is on the medication.   Medication- Adderall IR   Wt Readings from Last 3 Encounters:  01/01/22 173 lb 3.2 oz (78.6 kg)  08/01/21 181 lb 12.8 oz (82.5 kg)  09/30/20 175 lb (79.4 kg)     Daily routine: work. Spends time with her family, boyfriend on weekend Exercise:once a week, walk, goes to gym Employment: Ship broker, dispatcher for 5.5 years Support: parents, sister, fiance Household: fiance Marital status: single Number of children: 0  Education: graduated from college, no IEP, but "struggled through it"  Visit Diagnosis:    ICD-10-CM   1. GAD (generalized anxiety disorder)  F41.1 TSH    2. MDD (major depressive disorder), recurrent, in partial remission (HCC)  F33.41     3. Attention deficit hyperactivity disorder (ADHD), unspecified ADHD type  F90.9       Past Psychiatric History: Please see initial evaluation for  full details. I have reviewed the history. No updates at this time.     Past Medical History:  Past Medical History:  Diagnosis Date   Abscess of left breast    Abscess of left thigh    Abscess of right breast    Abscess of right thigh    Abscess of the breast and nipple 03/02/2019   Anxiety    Axillary abscess 07/02/2016   Biliary colic    Diabetes mellitus without complication (HCC)    GERD (gastroesophageal reflux disease)    Leukocytosis 07/19/2019   Migraines     Past Surgical History:  Procedure Laterality Date   CHOLECYSTECTOMY     ESOPHAGOGASTRODUODENOSCOPY (EGD) WITH PROPOFOL N/A 10/01/2019   Procedure:  ESOPHAGOGASTRODUODENOSCOPY (EGD) WITH PROPOFOL;  Surgeon: Toney ReilVanga, Rohini Reddy, MD;  Location: [facility];  Service: Gastroenterology;  Laterality: N/A;   I & D EXTREMITY Left 03/05/2019   Procedure: Irrigation and debridement;  Surgeon: Henrene DodgePiscoya, Jose, MD;  Location: [facility];  Service: General;  Laterality: Left;   INCISION AND DRAINAGE     right breast x1 and left groin x1 and left axilla   INCISION AND DRAINAGE ABSCESS Left 07/02/2016   Procedure: INCISION AND DRAINAGE ABSCESS;  Surgeon: Lattie Hawichard E Cooper, MD;  Location: [facility];  Service: General;  Laterality: Left;   INCISION AND DRAINAGE ABSCESS Left 04/18/2017   Procedure: INCISION AND DRAINAGE ABSCESS-LEFT GROIN;  Surgeon: Ancil Linseyavis, [name], MD;  Location: [facility];  Service: General;  Laterality: Left;   INCISION AND DRAINAGE ABSCESS Right 05/21/2018   Procedure: INCISION AND DRAINAGE ABSCESS- RIGHT THIGH;  Surgeon: Henrene DodgePiscoya, Jose, MD;  Location: [facility];  Service: General;  Laterality: Right;   INCISION AND DRAINAGE ABSCESS Left 08/23/2020   Procedure: INCISION AND DRAINAGE ABSCESS, axillary;  Surgeon: Henrene DodgePiscoya, Jose, MD;  Location: [facility];  Service: General;  Laterality: Left;  provider requesting 2 hours /120 minutes for procedure   IRRIGATION AND DEBRIDEMENT ABSCESS Right 04/09/2018   Procedure: IRRIGATION AND DEBRIDEMENT ABSCESS;  Surgeon: Henrene DodgePiscoya, Jose, MD;  Location: [facility];  Service: General;  Laterality: Right;   IRRIGATION AND DEBRIDEMENT ABSCESS Left 05/21/2018   Procedure: IRRIGATION AND DEBRIDEMENT BREAST ABSCESS;  Surgeon: Henrene DodgePiscoya, Jose, MD;  Location: [facility];  Service: General;  Laterality: Left;   IRRIGATION AND DEBRIDEMENT ABSCESS Right 01/06/2020   Procedure: MINOR INCISION AND DRAINAGE OF ABSCESS, right axillary;  Surgeon: Campbell Lernerodenberg, Denny, MD;  Location: [facility];  Service: General;  Laterality: Right;   PILONIDAL CYST EXCISION  12/03/2014   Procedure: CYST EXCISION PILONIDAL EXTENSIVE;  Surgeon: Duwaine MaxinWilliam Marterre, MD;   Location: [facility];  Service: General;;    Family Psychiatric History: Please see initial evaluation for full details. I have reviewed the history. No updates at this time.     Family History:  Family History  Problem Relation Age of Onset   Healthy Mother    Depression Father    Hypertension Father    Diabetes Father    Heart disease Father    Sarcoidosis Father    Anxiety disorder Sister    Healthy Brother    Obesity Brother    Cancer Paternal Grandfather        colon and also siblings had cancer ?type   Hypertension Other     Social History:  Social History   Socioeconomic History   Marital status: Single    Spouse name: Not on file   Number of children: Not on file   Years of education: Not  on file   Highest education level: Not on file  Occupational History   Not on file  Tobacco Use   Smoking status: Former    Types: Cigarettes   Smokeless tobacco: Never  Vaping Use   Vaping Use: Never used  Substance and Sexual Activity   Alcohol use: Yes    Alcohol/week: 1.0 - 2.0 standard drink    Types: 1 - 2 Glasses of wine per week    Comment: none last 24hrs   Drug use: No   Sexual activity: Yes  Other Topics Concern   Not on file  Social History Narrative   DPR mom Nelsy Madonna [phone]    Dad Roisin Mones [phone]    No kids    Works for [employer]   Social Determinants of Health   Financial Resource Strain: Not on file  Food Insecurity: Not on file  Transportation Needs: Not on file  Physical Activity: Not on file  Stress: Not on file  Social Connections: Not on file    Allergies:  Allergies  Allergen Reactions   Sulfa Antibiotics Hives    Metabolic Disorder Labs: Lab Results  Component Value Date   HGBA1C 8.9 (H) 07/19/2019   MPG 208.73 07/19/2019   No results found for: PROLACTIN Lab Results  Component Value Date   CHOL 127 02/26/2019   TRIG 96 02/26/2019   HDL 37 (L) 02/26/2019   CHOLHDL 3.4 02/26/2019   LDLCALC 71  02/26/2019   LDLCALC 94 10/21/2017   Lab Results  Component Value Date   TSH 0.773 02/26/2019   TSH 0.425 (L) 10/12/2016    Therapeutic Level Labs: No results found for: LITHIUM No results found for: VALPROATE No components found for:  CBMZ  Current Medications: Current Outpatient Medications  Medication Sig Dispense Refill   ARAZLO 0.045 % LOTN SMARTSIG:sparingly Topical Every Night     Azelaic Acid 15 % gel Apply 1 application. topically every morning.     B-D ULTRAFINE III SHORT PEN 31G X 8 MM MISC      buPROPion (WELLBUTRIN XL) 300 MG 24 hr tablet 1 tablet in the morning Orally Once a day     Clindamycin Phosphate foam Apply to affected areas 1-2 times daily as needed for outbreaks. 100 g 3   FLUoxetine (PROZAC) 20 MG capsule Take 1 capsule (20 mg total) by mouth daily. 30 capsule 1   Insulin Degludec (TRESIBA) 100 UNIT/ML SOLN Inject 20 Units into the skin daily.      LORazepam (ATIVAN) 0.5 MG tablet Take 1 tablet (0.5 mg total) by mouth 2 (two) times daily as needed for anxiety. 30 tablet 1   metFORMIN (GLUCOPHAGE-XR) 500 MG 24 hr tablet Take 500 mg by mouth in the morning, at noon, and at bedtime.      methylphenidate (RITALIN) 10 MG tablet Take 1 tablet (10 mg total) by mouth daily. 30 tablet 0   [START ON 02/01/2022] methylphenidate (RITALIN) 10 MG tablet Take 1 tablet (10 mg total) by mouth daily. 30 tablet 0   omeprazole (PRILOSEC) 40 MG capsule Take 1 capsule (40 mg total) by mouth daily. 30 minutes before food 90 capsule 3   Semaglutide, 2 MG/DOSE, (OZEMPIC, 2 MG/DOSE,) 8 MG/3ML SOPN as directed Subcutaneous     spironolactone (ALDACTONE) 50 MG tablet Take 50 mg by mouth daily.     Lancets (ONETOUCH DELICA PLUS LANCET33G) MISC      ONETOUCH ULTRA test strip      No current  facility-administered medications for this visit.     Musculoskeletal: Strength & Muscle Tone: within normal limits Gait & Station: normal Patient leans: N/A  Psychiatric Specialty  Exam: Review of Systems  Psychiatric/Behavioral:  Positive for decreased concentration, dysphoric mood and sleep disturbance. Negative for agitation, behavioral problems, confusion, hallucinations, self-injury and suicidal ideas. The patient is nervous/anxious. The patient is not hyperactive.   All other systems reviewed and are negative.  Blood pressure 129/84, pulse (!) 109, temperature 98.5 F (36.9 C), temperature source Temporal, weight 173 lb 3.2 oz (78.6 kg).Body mass index is 30.77 kg/m.  General Appearance: Fairly Groomed  Eye Contact:  Good  Speech:  Clear and Coherent  Volume:  Normal  Mood:   fine  Affect:  Appropriate, Congruent, and calm  Thought Process:  Coherent  Orientation:  Full (Time, Place, and Person)  Thought Content: Logical   Suicidal Thoughts:  No  Homicidal Thoughts:  No  Memory:  Immediate;   Good  Judgement:  Good  Insight:  Good  Psychomotor Activity:  Normal  Concentration:  Concentration: Good and Attention Span: Good  Recall:  Good  Fund of Knowledge: Good  Language: Good  Akathisia:  No  Handed:  Right  AIMS (if indicated): not done  Assets:  Communication Skills Desire for Improvement  ADL's:  Intact  Cognition: WNL  Sleep:  Fair   Screenings: GAD-7    Flowsheet Row Office Visit from 04/29/2020 in [facility] [place]  Total GAD-7 Score 0      PHQ2-9    Flowsheet Row Office Visit from 01/01/2022 in [facility] Office Visit from 08/01/2021 in [facility] [place] Video Visit from 02/16/2021 in [facility] Office Visit from 04/29/2020 in [facility] [place]  PHQ-2 Total Score 0 0 1 0  PHQ-9 Total Score -- 0 -- 0      Flowsheet Row ED from 01/31/2021 in [facility]  Admission (Discharged) from 08/23/2020 in [facility]  C-SSRS RISK CATEGORY No Risk No Risk        Assessment and  Plan:  Dawn Ortiz is a 31 y.o. year old female with a history of depression, ADHD (no formal evaluation), type II diabetes, who presents for follow up appointment for below.   1. GAD (generalized anxiety disorder) 2. MDD (major depressive disorder), recurrent, in partial remission (HCC) She reports symptoms of anxiety since college, which has been interfering with her ability,  although it has been relatively manageable lately.  She reports great relationship with her fianc, her family, and enjoys her work.  She has not taken bupropion for several months.  Will start fluoxetine to target anxiety and depression.  This medication is chosen given her tendency of difficulty in adherence to medication.  Will continue lorazepam as needed for anxiety.  Discussed risk of dependence, tolerance and oversedation.   3. Attention deficit hyperactivity disorder (ADHD), unspecified ADHD type She continues to have ADHD symptoms since college, and has had a good benefit from Adderall IR.  She had no neuropsychological testing in the past.  Given his potential long-term risk, she is willing to try Concerta at this time.  Discussed potential risk of headache, insomnia, palpitation, anxiety, and serotonin syndrome with concomitant use of fluoxetine.   # tachycardia She reports history of tachycardia (sinus rhythm on EKG in 07/2020) .  She has not been checked for TSH since 2021 according to the chart review.  Will check this to rule out any medical health issues especially given her anxiety symptoms.   # Insomnia She reports occasional snoring, and has daytime fatigue.  She will consider evaluation of sleep apnea in the future.   Plan Start fluoxetine 20 mg daily  Discontinue Adderall IR (was on 20 mg) Start Ritalin 10 mg daily (she was advised to take 20 mg daily total if 10 mg does not work) Continue lorazepam 0.5 mg daily as needed for anxiety  Next appointment- 7/10 at 4 PM for 30 mins, in person Obtain  TSH  Past trials of medication- escitalopram, sertraline, Adderall,    The patient demonstrates the following risk factors for suicide: Chronic risk factors for suicide include: psychiatric disorder of depression . Acute risk factors for suicide include: N/A. Protective factors for this patient include: hope for the future. Considering these factors, the overall suicide risk at this point appears to be low. Patient is appropriate for outpatient follow up.       Collaboration of Care: Collaboration of Care: Other N/A  Patient/Guardian was advised Release of Information must be obtained prior to any record release in order to collaborate their care with an outside provider. Patient/Guardian was advised if they have not already done so to contact the registration department to sign all necessary forms in order for [place] to release information regarding their care.   Consent: Patient/Guardian gives verbal consent for treatment and assignment of benefits for services provided during this visit. Patient/Guardian expressed understanding and agreed to proceed.   The duration of this appointment visit was 42 minutes of face-to-face time with the patient.  Greater than 50% of this time was spent in counseling, explanation of  diagnosis, planning of further management, and coordination of care.    Neysa Hotter, MD 01/01/2022, 9:03 AM

## 2022-01-01 ENCOUNTER — Encounter: Payer: Self-pay | Admitting: Psychiatry

## 2022-01-01 ENCOUNTER — Ambulatory Visit (INDEPENDENT_AMBULATORY_CARE_PROVIDER_SITE_OTHER): Payer: 59 | Admitting: Psychiatry

## 2022-01-01 VITALS — BP 129/84 | HR 109 | Temp 98.5°F | Wt 173.2 lb

## 2022-01-01 DIAGNOSIS — F3341 Major depressive disorder, recurrent, in partial remission: Secondary | ICD-10-CM

## 2022-01-01 DIAGNOSIS — F909 Attention-deficit hyperactivity disorder, unspecified type: Secondary | ICD-10-CM

## 2022-01-01 DIAGNOSIS — F411 Generalized anxiety disorder: Secondary | ICD-10-CM

## 2022-01-01 MED ORDER — METHYLPHENIDATE HCL 10 MG PO TABS: 10.0000 mg | ORAL_TABLET | Freq: Every day | ORAL | 0 refills | Status: DC

## 2022-01-01 MED ORDER — FLUOXETINE HCL 20 MG PO CAPS
20.0000 mg | ORAL_CAPSULE | Freq: Every day | ORAL | 1 refills | Status: DC
Start: 2022-01-01 — End: 2022-11-28

## 2022-01-01 NOTE — Patient Instructions (Signed)
Start fluoxetine 20 mg daily  Discontinue Adderall IR  Start Ritalin 10 mg daily  Continue lorazepam 0.5 mg daily as needed for anxiety  Next appointment- 7/10 at 4 PM

## 2022-02-01 NOTE — Progress Notes (Deleted)
BH MD/PA/NP OP Progress Note  02/01/2022 4:23 PM Dawn Ortiz  MRN:  [id number]  Chief Complaint: No chief complaint on file.  HPI: *** Visit Diagnosis: No diagnosis found.  Past Psychiatric History: Please see initial evaluation for full details. I have reviewed the history. No updates at this time.     Past Medical History:  Past Medical History:  Diagnosis Date   Abscess of left breast    Abscess of left thigh    Abscess of right breast    Abscess of right thigh    Abscess of the breast and nipple 03/02/2019   Anxiety    Axillary abscess 07/02/2016   Biliary colic    Diabetes mellitus without complication (HCC)    GERD (gastroesophageal reflux disease)    Leukocytosis 07/19/2019   Migraines     Past Surgical History:  Procedure Laterality Date   CHOLECYSTECTOMY     ESOPHAGOGASTRODUODENOSCOPY (EGD) WITH PROPOFOL N/A 10/01/2019   Procedure: ESOPHAGOGASTRODUODENOSCOPY (EGD) WITH PROPOFOL;  Surgeon: Toney Reil, MD;  Location: [facility];  Service: Gastroenterology;  Laterality: N/A;   I & D EXTREMITY Left 03/05/2019   Procedure: Irrigation and debridement;  Surgeon: Henrene Dodge, MD;  Location: [facility];  Service: General;  Laterality: Left;   INCISION AND DRAINAGE     right breast x1 and left groin x1 and left axilla   INCISION AND DRAINAGE ABSCESS Left 07/02/2016   Procedure: INCISION AND DRAINAGE ABSCESS;  Surgeon: Lattie Haw, MD;  Location: [facility];  Service: General;  Laterality: Left;   INCISION AND DRAINAGE ABSCESS Left 04/18/2017   Procedure: INCISION AND DRAINAGE ABSCESS-LEFT GROIN;  Surgeon: Ancil Linsey, MD;  Location: [facility];  Service: General;  Laterality: Left;   INCISION AND DRAINAGE ABSCESS Right 05/21/2018   Procedure: INCISION AND DRAINAGE ABSCESS- RIGHT THIGH;  Surgeon: Henrene Dodge, MD;  Location: [facility];  Service: General;  Laterality: Right;   INCISION AND DRAINAGE ABSCESS Left 08/23/2020   Procedure: INCISION AND DRAINAGE  ABSCESS, axillary;  Surgeon: Henrene Dodge, MD;  Location: [facility];  Service: General;  Laterality: Left;  provider requesting 2 hours /120 minutes for procedure   IRRIGATION AND DEBRIDEMENT ABSCESS Right 04/09/2018   Procedure: IRRIGATION AND DEBRIDEMENT ABSCESS;  Surgeon: Henrene Dodge, MD;  Location: [facility];  Service: General;  Laterality: Right;   IRRIGATION AND DEBRIDEMENT ABSCESS Left 05/21/2018   Procedure: IRRIGATION AND DEBRIDEMENT BREAST ABSCESS;  Surgeon: Henrene Dodge, MD;  Location: [facility];  Service: General;  Laterality: Left;   IRRIGATION AND DEBRIDEMENT ABSCESS Right 01/06/2020   Procedure: MINOR INCISION AND DRAINAGE OF ABSCESS, right axillary;  Surgeon: Campbell Lerner, MD;  Location: [facility];  Service: General;  Laterality: Right;   PILONIDAL CYST EXCISION  12/03/2014   Procedure: CYST EXCISION PILONIDAL EXTENSIVE;  Surgeon: Duwaine Maxin, MD;  Location: [facility];  Service: General;;    Family Psychiatric History: Please see initial evaluation for full details. I have reviewed the history. No updates at this time.     Family History:  Family History  Problem Relation Age of Onset   Healthy Mother    Depression Father    Hypertension Father    Diabetes Father    Heart disease Father    Sarcoidosis Father    Anxiety disorder Sister    Healthy Brother    Obesity Brother    Cancer Paternal Grandfather        colon and also siblings had cancer ?type   Hypertension Other  Social History:  Social History   Socioeconomic History   Marital status: Single    Spouse name: Not on file   Number of children: Not on file   Years of education: Not on file   Highest education level: Not on file  Occupational History   Not on file  Tobacco Use   Smoking status: Former    Types: Cigarettes   Smokeless tobacco: Never  Vaping Use   Vaping Use: Never used  Substance and Sexual Activity   Alcohol use: Yes    Alcohol/week: 1.0 - 2.0 standard drink of alcohol     Types: 1 - 2 Glasses of wine per week    Comment: none last 24hrs   Drug use: No   Sexual activity: Yes  Other Topics Concern   Not on file  Social History Narrative   DPR mom Dawn Ortiz [phone]    Dad Dawn Ortiz [phone]    No kids    Works for [employer]   Social Determinants of Health   Financial Resource Strain: Not on file  Food Insecurity: Not on file  Transportation Needs: Not on file  Physical Activity: Not on file  Stress: Not on file  Social Connections: Not on file    Allergies:  Allergies  Allergen Reactions   Sulfa Antibiotics Hives    Metabolic Disorder Labs: Lab Results  Component Value Date   HGBA1C 8.9 (H) 07/19/2019   MPG 208.73 07/19/2019   No results found for: "PROLACTIN" Lab Results  Component Value Date   CHOL 127 02/26/2019   TRIG 96 02/26/2019   HDL 37 (L) 02/26/2019   CHOLHDL 3.4 02/26/2019   LDLCALC 71 02/26/2019   LDLCALC 94 10/21/2017   Lab Results  Component Value Date   TSH 0.773 02/26/2019   TSH 0.425 (L) 10/12/2016    Therapeutic Level Labs: No results found for: "LITHIUM" No results found for: "VALPROATE" No results found for: "CBMZ"  Current Medications: Current Outpatient Medications  Medication Sig Dispense Refill   ARAZLO 0.045 % LOTN SMARTSIG:sparingly Topical Every Night     Azelaic Acid 15 % gel Apply 1 application. topically every morning.     B-D ULTRAFINE III SHORT PEN 31G X 8 MM MISC      buPROPion (WELLBUTRIN XL) 300 MG 24 hr tablet 1 tablet in the morning Orally Once a day     Clindamycin Phosphate foam Apply to affected areas 1-2 times daily as needed for outbreaks. 100 g 3   FLUoxetine (PROZAC) 20 MG capsule Take 1 capsule (20 mg total) by mouth daily. 30 capsule 1   Insulin Degludec (TRESIBA) 100 UNIT/ML SOLN Inject 20 Units into the skin daily.      Lancets (ONETOUCH DELICA PLUS LANCET33G) MISC      LORazepam (ATIVAN) 0.5 MG tablet Take 1 tablet (0.5 mg total) by mouth 2 (two) times  daily as needed for anxiety. 30 tablet 1   metFORMIN (GLUCOPHAGE-XR) 500 MG 24 hr tablet Take 500 mg by mouth in the morning, at noon, and at bedtime.      methylphenidate (RITALIN) 10 MG tablet Take 1 tablet (10 mg total) by mouth daily. 30 tablet 0   methylphenidate (RITALIN) 10 MG tablet Take 1 tablet (10 mg total) by mouth daily. 30 tablet 0   omeprazole (PRILOSEC) 40 MG capsule Take 1 capsule (40 mg total) by mouth daily. 30 minutes before food 90 capsule 3   ONETOUCH ULTRA test strip  Semaglutide, 2 MG/DOSE, (OZEMPIC, 2 MG/DOSE,) 8 MG/3ML SOPN as directed Subcutaneous     spironolactone (ALDACTONE) 50 MG tablet Take 50 mg by mouth daily.     No current facility-administered medications for this visit.     Musculoskeletal: Strength & Muscle Tone: within normal limits Gait & Station: normal Patient leans: N/A  Psychiatric Specialty Exam: Review of Systems  There were no vitals taken for this visit.There is no height or weight on file to calculate BMI.  General Appearance: {Appearance:22683}  Eye Contact:  {BHH EYE CONTACT:22684}  Speech:  Clear and Coherent  Volume:  Normal  Mood:  {BHH MOOD:22306}  Affect:  {Affect (PAA):22687}  Thought Process:  Coherent  Orientation:  Full (Time, Place, and Person)  Thought Content: Logical   Suicidal Thoughts:  {ST/HT (PAA):22692}  Homicidal Thoughts:  {ST/HT (PAA):22692}  Memory:  Immediate;   Good  Judgement:  {Judgement (PAA):22694}  Insight:  {Insight (PAA):22695}  Psychomotor Activity:  Normal  Concentration:  Concentration: Good and Attention Span: Good  Recall:  Good  Fund of Knowledge: Good  Language: Good  Akathisia:  No  Handed:  Right  AIMS (if indicated): not done  Assets:  Communication Skills Desire for Improvement  ADL's:  Intact  Cognition: WNL  Sleep:  {BHH GOOD/FAIR/POOR:22877}   Screenings: GAD-7    Flowsheet Row Office Visit from 04/29/2020 in [facility]  Total GAD-7 Score 0       PHQ2-9    Flowsheet Row Office Visit from 01/01/2022 in [facility] Office Visit from 08/01/2021 in [facility] Video Visit from 02/16/2021 in [facility] Office Visit from 04/29/2020 in [facility]  PHQ-2 Total Score 0 0 1 0  PHQ-9 Total Score -- 0 -- 0      Flowsheet Row ED from 01/31/2021 in [facility]  Admission (Discharged) from 08/23/2020 in [facility]  C-SSRS RISK CATEGORY No Risk No Risk        Assessment and Plan:  LESA VANDALL is a 31 y.o. year old female with a history of depression, ADHD (no formal evaluation), type II diabetes, who presents for follow up appointment for below.   1. GAD (generalized anxiety disorder) 2. MDD (major depressive disorder), recurrent, in partial remission (HCC) She reports symptoms of anxiety since college, which has been interfering with her ability,  although it has been relatively manageable lately.  She reports great relationship with her fianc, her family, and enjoys her work.  She has not taken bupropion for several months.  Will start fluoxetine to target anxiety and depression.  This medication is chosen given her tendency of difficulty in adherence to medication.  Will continue lorazepam as needed for anxiety.  Discussed risk of dependence, tolerance and oversedation.    3. Attention deficit hyperactivity disorder (ADHD), unspecified ADHD type She continues to have ADHD symptoms since college, and has had a good benefit from Adderall IR.  She had no neuropsychological testing in the past.  Given his potential long-term risk, she is willing to try Concerta at this time.  Discussed potential risk of headache, insomnia, palpitation, anxiety, and serotonin syndrome with concomitant use of fluoxetine.    # tachycardia She reports history of tachycardia (sinus rhythm on EKG in 07/2020)  .  She has not been checked for TSH since 2021 according to the chart review.  Will check this to rule out any medical health  issues especially given her anxiety symptoms.    # Insomnia She reports occasional snoring, and has daytime fatigue.  She will consider evaluation of sleep apnea in the future.    Plan Start fluoxetine 20 mg daily  Discontinue Adderall IR (was on 20 mg) Start Ritalin 10 mg daily (she was advised to take 20 mg daily total if 10 mg does not work) Continue lorazepam 0.5 mg daily as needed for anxiety  Next appointment- 7/10 at 4 PM for 30 mins, in person Obtain TSH   Past trials of medication- escitalopram, sertraline, Adderall,    The patient demonstrates the following risk factors for suicide: Chronic risk factors for suicide include: psychiatric disorder of depression . Acute risk factors for suicide include: N/A. Protective factors for this patient include: hope for the future. Considering these factors, the overall suicide risk at this point appears to be low. Patient is appropriate for outpatient follow up.             Collaboration of Care: Collaboration of Care: {BH OP Collaboration of Care:21014065}  Patient/Guardian was advised Release of Information must be obtained prior to any record release in order to collaborate their care with an outside provider. Patient/Guardian was advised if they have not already done so to contact the registration department to sign all necessary forms in order for [place] to release information regarding their care.   Consent: Patient/Guardian gives verbal consent for treatment and assignment of benefits for services provided during this visit. Patient/Guardian expressed understanding and agreed to proceed.    Neysa Hotter, MD 02/01/2022, 4:23 PM

## 2022-02-05 ENCOUNTER — Ambulatory Visit: Payer: 59 | Admitting: Psychiatry

## 2022-05-28 ENCOUNTER — Ambulatory Visit: Payer: Self-pay | Admitting: Surgery

## 2022-06-27 ENCOUNTER — Ambulatory Visit: Admit: 2022-06-27 | Discharge: 2022-06-28 | Attending: Family Medicine | Primary: Family Medicine

## 2022-06-27 DIAGNOSIS — J0100 Acute maxillary sinusitis, unspecified: Secondary | ICD-10-CM

## 2022-06-27 NOTE — Progress Notes (Signed)
Subjective:      Patient ID: Heidi Combs       Cough    Sinusitis  Associated symptoms include coughing.    The patient is a 31 y.o. female who presents with facial pressure, congestion. She says cough started on the weekend. She reports today she feels so much worse, having intense facial pan and teeth pain. She say she feels like she has to blow her nose but cannot. She is coughing up some discolored sputum.     Review of Systems   Respiratory:  Positive for cough.    : As noted in the HPI    No past medical history on file.     No past surgical history on file.     Allergies   Allergen Reactions    Cefaclor Hives     Other reaction(s): Unknown    Sumatriptan Swelling     Other reaction(s): shortness of breath  angioedema          Current Outpatient Medications   Medication Sig Dispense Refill    amoxicillin-clavulanate (AUGMENTIN) 875-125 MG per tablet Take 1 tablet by mouth 2 times daily for 10 days 20 tablet 0    escitalopram (LEXAPRO) 20 MG tablet 1 tablet Orally Once a day for 30 day(s)      Levonorgest-Eth Estrad 91-Day 0.15-0.03 &0.01 MG TABS 1 tablet Orally Once a day for 91 day(s)      valACYclovir (VALTREX) 1 g tablet 2 tablets every 12 hours X 1 day PRN outbreak (Patient not taking: Reported on 06/27/2022) 28 tablet 2    Methylphenidate HCl ER 18 MG TB24 Take 18 mg by mouth every morning. (Patient not taking: Reported on 07/06/2021)      acyclovir (ZOVIRAX) 400 MG tablet Take 400 mg by mouth 3 times daily (Patient not taking: Reported on 06/27/2022)      norethindrone-ethinyl estradiol-iron (MICROGESTIN FE1.5/30) 1.5-30 MG-MCG tablet 1 tablet Orally Once a day (Patient not taking: Reported on 08/10/2021)      buPROPion (WELLBUTRIN SR) 100 MG extended release tablet 1 tablet in the morning Orally Once a day (Patient not taking: Reported on 06/02/2021)      FLUoxetine (PROZAC) 40 MG capsule 1 capsule in the morning Orally Once a day (Patient not taking: Reported on 06/02/2021)       HYDROcodone-acetaminophen (NORCO) 5-325 MG per tablet 1 tablet as needed Orally every 6 hrs (Patient not taking: Reported on 06/02/2021)      Levonorgest-Eth Estrad 91-Day 0.1-0.02 & 0.01 MG TABS 1 tablet Orally Once a day for 91 day(s) (Patient not taking: Reported on 07/06/2021)      LORazepam (ATIVAN) 0.5 MG tablet 1 tablet at bedtime as needed Orally Once a day (Patient not taking: Reported on 06/02/2021)      naproxen sodium (ANAPROX) 220 MG tablet 1 tablet with food or milk as needed Orally every 12 hrs (Patient not taking: Reported on 06/02/2021)      tiZANidine (ZANAFLEX) 4 MG tablet 1/2-1 tablet as needed Orally Once nightly (Patient not taking: Reported on 06/02/2021)       No current facility-administered medications for this visit.        BP 121/74   Pulse 76   Temp 98.3 F (36.8 C)   Resp 16   Ht 1.702 m (5\' 7" )   Wt 72.6 kg (160 lb)   SpO2 98%   BMI 25.06 kg/m      Objective:   Physical Exam  Constitutional:  Appearance: She is ill-appearing.   HENT:      Head: Normocephalic and atraumatic.      Right Ear: Tympanic membrane normal.      Left Ear: Tympanic membrane normal.      Nose: Congestion and rhinorrhea present.      Right Sinus: Maxillary sinus tenderness present.      Left Sinus: Maxillary sinus tenderness present.   Eyes:      Conjunctiva/sclera: Conjunctivae normal.   Cardiovascular:      Rate and Rhythm: Normal rate and regular rhythm.   Pulmonary:      Effort: Pulmonary effort is normal.      Breath sounds: Normal breath sounds.   Neurological:      Mental Status: She is alert.   Psychiatric:         Mood and Affect: Mood normal.       No scans are attached to the encounter.     No results found for any visits on 06/27/22.  Assessment/Plan:   1. Acute non-recurrent maxillary sinusitis  -     amoxicillin-clavulanate (AUGMENTIN) 875-125 MG per tablet; Take 1 tablet by mouth 2 times daily for 10 days, Disp-20 tablet, R-0Normal    Discussed that her symptoms do sound like sinusitis. She  can try to use Sudafed, fluids and honey. If no improvements she can start Augmentin. Patient was in agreement with plan. Follow up if worsening.     Earlie Lou, DO

## 2022-06-28 MED ORDER — AMOXICILLIN-POT CLAVULANATE 875-125 MG PO TABS
875-125 MG | ORAL_TABLET | Freq: Two times a day (BID) | ORAL | 0 refills | Status: AC
Start: 2022-06-28 — End: 2022-07-07

## 2022-07-18 ENCOUNTER — Ambulatory Visit
Admission: EM | Admit: 2022-07-18 | Discharge: 2022-07-18 | Disposition: A | Discharge status: Home / Self Care | Payer: Managed Care, Other (non HMO) | Attending: Emergency Medicine | Admitting: Emergency Medicine

## 2022-07-18 DIAGNOSIS — N611 Abscess of the breast and nipple: Secondary | ICD-10-CM

## 2022-07-18 DIAGNOSIS — I10 Essential (primary) hypertension: Secondary | ICD-10-CM

## 2022-07-18 MED ORDER — CEPHALEXIN 500 MG PO CAPS: 500.0000 mg | ORAL_CAPSULE | Freq: Four times a day (QID) | ORAL | 0 refills | Status: DC

## 2022-07-18 NOTE — ED Triage Notes (Signed)
Patient to Urgent Care with complaints of abscess present to right breast. Appeared approx 3-4 days ago. Denies any drainage.   Hx of multiple abscesses.

## 2022-07-18 NOTE — Discharge Instructions (Addendum)
Take the cephalexin as directed.  Follow up with your primary care provider or surgeon if your symptoms are not improving.    Your blood pressure is elevated today at 143/98.  Please have this rechecked by your primary care provider in 2-4 weeks.

## 2022-07-18 NOTE — ED Provider Notes (Signed)
[facility]    CSN: [id number] Arrival date & time: 07/18/22  1000      History   Chief Complaint Chief Complaint  Patient presents with   Abscess    Entered by patient    HPI Dawn Ortiz is a 31 y.o. female.  Patient presents with an abscess on the bottom of her right breast x 3 to 4 days.  She denies fever, chills, redness, open wound, drainage, or other symptoms.  She has been taking doxycycline which was left over from previous abscess.  She has seen general surgeon previously for recurrent abscesses.  Her medical history includes diabetes, hypertension, obesity, frequent abscesses, hidradenitis suppurativa.  The history is provided by the patient and medical records.    Past Medical History:  Diagnosis Date   Abscess of left breast    Abscess of left thigh    Abscess of right breast    Abscess of right thigh    Abscess of the breast and nipple 03/02/2019   Anxiety    Axillary abscess 07/02/2016   Biliary colic    Diabetes mellitus without complication (HCC)    GERD (gastroesophageal reflux disease)    Leukocytosis 07/19/2019   Migraines     Patient Active Problem List   Diagnosis Date Noted   Hidradenitis suppurativa 04/29/2020   Annual physical exam 04/29/2020   Sinus tachycardia 04/29/2020   Hypertension associated with diabetes (HCC) 04/29/2020   Obesity (BMI 30.0-34.9) 04/29/2020   Dyspepsia    Abdominal pain 07/19/2019   Intractable nausea and vomiting 07/19/2019   HTN (hypertension) 07/19/2019   Attention deficit hyperactivity disorder (ADHD), 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   Depression with anxiety 05/18/2019   Type 2 diabetes mellitus with hyperglycemia, with long-term current use of insulin (HCC) 02/06/2019   Anxiety    Diabetes mellitus without complication (HCC)    GERD (gastroesophageal reflux disease)    Abscess of left axilla 07/02/2016    Past Surgical History:  Procedure Laterality Date   CHOLECYSTECTOMY      ESOPHAGOGASTRODUODENOSCOPY (EGD) WITH PROPOFOL N/A 10/01/2019   Procedure: ESOPHAGOGASTRODUODENOSCOPY (EGD) WITH PROPOFOL;  Surgeon: Toney ReilVanga, Rohini Reddy, MD;  Location: [facility];  Service: Gastroenterology;  Laterality: N/A;   I & D EXTREMITY Left 03/05/2019   Procedure: Irrigation and debridement;  Surgeon: Henrene DodgePiscoya, Jose, MD;  Location: [facility];  Service: General;  Laterality: Left;   INCISION AND DRAINAGE     right breast x1 and left groin x1 and left axilla   INCISION AND DRAINAGE ABSCESS Left 07/02/2016   Procedure: INCISION AND DRAINAGE ABSCESS;  Surgeon: Lattie Hawichard E Cooper, MD;  Location: [facility];  Service: General;  Laterality: Left;   INCISION AND DRAINAGE ABSCESS Left 04/18/2017   Procedure: INCISION AND DRAINAGE ABSCESS-LEFT GROIN;  Surgeon: Ancil Linseyavis, [name], MD;  Location: [facility];  Service: General;  Laterality: Left;   INCISION AND DRAINAGE ABSCESS Right 05/21/2018   Procedure: INCISION AND DRAINAGE ABSCESS- RIGHT THIGH;  Surgeon: Henrene DodgePiscoya, Jose, MD;  Location: [facility];  Service: General;  Laterality: Right;   INCISION AND DRAINAGE ABSCESS Left 08/23/2020   Procedure: INCISION AND DRAINAGE ABSCESS, axillary;  Surgeon: Henrene DodgePiscoya, Jose, MD;  Location: [facility];  Service: General;  Laterality: Left;  provider requesting 2 hours /120 minutes for procedure   IRRIGATION AND DEBRIDEMENT ABSCESS Right 04/09/2018   Procedure: IRRIGATION AND DEBRIDEMENT ABSCESS;  Surgeon: Henrene DodgePiscoya, Jose, MD;  Location: [facility];  Service: General;  Laterality: Right;   IRRIGATION AND DEBRIDEMENT ABSCESS Left  05/21/2018   Procedure: IRRIGATION AND DEBRIDEMENT BREAST ABSCESS;  Surgeon: Henrene Dodge, MD;  Location: [facility];  Service: General;  Laterality: Left;   IRRIGATION AND DEBRIDEMENT ABSCESS Right 01/06/2020   Procedure: MINOR INCISION AND DRAINAGE OF ABSCESS, right axillary;  Surgeon: Campbell Lerner, MD;  Location: [facility];  Service: General;  Laterality: Right;   PILONIDAL CYST EXCISION  12/03/2014    Procedure: CYST EXCISION PILONIDAL EXTENSIVE;  Surgeon: Duwaine Maxin, MD;  Location: [facility];  Service: General;;    OB History   No obstetric history on file.      Home Medications    Prior to Admission medications   Medication Sig Start Date End Date Taking? Authorizing Provider  cephALEXin (KEFLEX) 500 MG capsule Take 1 capsule (500 mg total) by mouth 4 (four) times daily. 07/18/22  Yes Mickie Bail, NP  ARAZLO 0.045 % LOTN SMARTSIG:sparingly Topical Every Night 08/04/21   [provider]  Azelaic Acid 15 % gel Apply 1 application. topically every morning. 08/04/21   [provider]  B-D ULTRAFINE III SHORT PEN 31G X 8 MM MISC  07/28/19   [provider]  buPROPion (WELLBUTRIN XL) 300 MG 24 hr tablet 1 tablet in the morning Orally Once a day    [provider]  Clindamycin Phosphate foam Apply to affected areas 1-2 times daily as needed for outbreaks. 10/04/20   Willeen Niece, MD  FLUoxetine (PROZAC) 20 MG capsule Take 1 capsule (20 mg total) by mouth daily. 01/01/22 03/02/22  Neysa Hotter, MD  Insulin Degludec (TRESIBA) 100 UNIT/ML SOLN Inject 20 Units into the skin daily.     [provider]  Lancets [facility] [place] PLUS [place]) MISC  07/28/19   [provider]  LORazepam (ATIVAN) 0.5 MG tablet Take 1 tablet (0.5 mg total) by mouth 2 (two) times daily as needed for anxiety. 02/16/21   Neysa Hotter, MD  metFORMIN (GLUCOPHAGE-XR) 500 MG 24 hr tablet Take 500 mg by mouth in the morning, at noon, and at bedtime.  07/03/19   [provider]  methylphenidate (RITALIN) 10 MG tablet Take 1 tablet (10 mg total) by mouth daily. 01/01/22 01/31/22  Neysa Hotter, MD  methylphenidate (RITALIN) 10 MG tablet Take 1 tablet (10 mg total) by mouth daily. 02/01/22 03/03/22  Neysa Hotter, MD  omeprazole (PRILOSEC) 40 MG capsule Take 1 capsule (40 mg total) by mouth daily. 30 minutes before food 08/01/21   McLean-Scocuzza, Pasty Spillers, MD  [facility] ULTRA  test strip  07/28/19   [provider]  Semaglutide, 2 MG/DOSE, (OZEMPIC, 2 MG/DOSE,) 8 MG/3ML SOPN as directed Subcutaneous    [provider]  spironolactone (ALDACTONE) 50 MG tablet Take 50 mg by mouth daily. 11/30/21   [provider]    Family History Family History  Problem Relation Age of Onset   Healthy Mother    Depression Father    Hypertension Father    Diabetes Father    Heart disease Father    Sarcoidosis Father    Anxiety disorder Sister    Healthy Brother    Obesity Brother    Cancer Paternal Grandfather        colon and also siblings had cancer ?type   Hypertension Other     Social History Social History   Tobacco Use   Smoking status: Former    Types: Cigarettes   Smokeless tobacco: Never  Vaping Use   Vaping Use: Never used  Substance Use Topics   Alcohol use:  Yes    Alcohol/week: 1.0 - 2.0 standard drink of alcohol    Types: 1 - 2 Glasses of wine per week    Comment: none last 24hrs   Drug use: No     Allergies   Sulfa antibiotics   Review of Systems Review of Systems  Constitutional:  Negative for chills and fever.  Skin:  Positive for wound. Negative for color change.  All other systems reviewed and are negative.    Physical Exam Triage Vital Signs ED Triage Vitals  Enc Vitals Group     BP 07/18/22 1058 (!) 143/98     Pulse Rate 07/18/22 1041 97     Resp 07/18/22 1041 18     Temp 07/18/22 1041 98.7 F (37.1 C)     Temp src --      SpO2 07/18/22 1041 97 %     Weight 07/18/22 1056 183 lb (83 kg)     Height 07/18/22 1056 5\' 2"  (1.575 m)     Head Circumference --      Peak Flow --      Pain Score 07/18/22 1055 7     Pain Loc --      Pain Edu? --      Excl. in GC? --    No data found.  Updated Vital Signs BP 136/86   Pulse 97   Temp 98.7 F (37.1 C)   Resp 18   Ht 5\' 2"  (1.575 m)   Wt 183 lb (83 kg)   LMP 07/11/2022   SpO2 97%   BMI 33.47 kg/m   Visual Acuity Right Eye Distance:   Left  Eye Distance:   Bilateral Distance:    Right Eye Near:   Left Eye Near:    Bilateral Near:     Physical Exam Vitals and nursing note reviewed.  Constitutional:      General: She is not in acute distress.    Appearance: She is well-developed. She is not ill-appearing.  HENT:     Mouth/Throat:     Mouth: Mucous membranes are moist.  Cardiovascular:     Rate and Rhythm: Normal rate and regular rhythm.  Pulmonary:     Effort: Pulmonary effort is normal. No respiratory distress.  Musculoskeletal:     Cervical back: Neck supple.  Skin:    General: Skin is warm and dry.     Findings: Lesion present. No erythema.     Comments: Flesh-colored area of tender induration on right breast. No open wound or drainage.  Patient declines I&D.   Neurological:     Mental Status: She is alert.  Psychiatric:        Mood and Affect: Mood normal.        Behavior: Behavior normal.      UC Treatments / Results  Labs (all labs ordered are listed, but only abnormal results are displayed) Labs Reviewed - No data to display  EKG   Radiology No results found.  Procedures Procedures (including critical care time)  Medications Ordered in UC Medications - No data to display  Initial Impression / Assessment and Plan / UC Course  I have reviewed the triage vital signs and the nursing notes.  Pertinent labs & imaging results that were available during my care of the patient were reviewed by me and considered in my medical decision making (see chart for details).   Abscess of breast, elevated blood pressure reading with hypertension.  Patient declines I&D today.  She has  been taking doxycycline that she had left over from previous abscess.  She is allergic to sulfa medications.  Treating today with cephalexin.  Instructed patient to follow up with her PCP or general surgeon if her symptoms are not improving.  Also discussed with patient that her blood pressure is elevated today and needs to be  rechecked by PCP in 2 to 4 weeks.  Education provided on managing hypertension.  She agrees to plan of care.     Final Clinical Impressions(s) / UC Diagnoses   Final diagnoses:  Abscess of breast  Elevated blood pressure reading in office with diagnosis of hypertension     Discharge Instructions      Take the cephalexin as directed.  Follow up with your primary care provider or surgeon if your symptoms are not improving.    Your blood pressure is elevated today at 143/98.  Please have this rechecked by your primary care provider in 2-4 weeks.          ED Prescriptions     Medication Sig Dispense Auth. Provider   cephALEXin (KEFLEX) 500 MG capsule Take 1 capsule (500 mg total) by mouth 4 (four) times daily. 28 capsule Mickie Bail, NP      PDMP not reviewed this encounter.   Mickie Bail, NP 07/18/22 1144

## 2022-07-30 NOTE — ED Notes (Signed)
Formatting of this note might be different from the original.  Pt is medically cleared for safe discharge by MD. All discharge instructions and RX given and explained to pt. Pt instructions to follow up with pmd within 1-3 days and return for new or worsening symptoms. Pt verbalize understanding, all questions answered. Pt is aox4, respirations even and unlabored, vss, no s/s of acute distress, ambulatory with steady gait. Pt ID band removed.No   IV in place. Pt left ED with all personal belongings in hand.     Electronically signed by Arvid Right, RN at 07/30/2022  1:40 PM PST

## 2022-07-30 NOTE — ED Notes (Signed)
Formatting of this note might be different from the original.  Pt resting on gurney with 2 siderails up, respirations even and unlabored. NAD; skin warm, dry, appropriate for race. Call light within reach. No further needs noted at this time.  Electronically signed by Felicity Pellegrini, RN at 07/30/2022 11:16 AM PST

## 2022-07-30 NOTE — ED Provider Notes (Signed)
Formatting of this note is different from the original.  Images from the original note were not included.    TREATMENT RECORD     Patient Name: Heidi Combs  MRN: [id number]  Author of note: Heidi Jacobs, MD     [place]     Chief Complaint   Patient presents with   ? Sore Throat   ? Cough   ? Chills       HISTORY OF PRESENT ILLNESS   Heidi Combs is a 32 year old female with no past medical history who presents to the ED with a complaint of sore throat, runny nose, congestion, bilateral ear ache, hoarseness, chills, body aches and productive cough that began 3 days ago.  Patient admits she recently traveled from [place] and is visiting her parents in [place].  She also admits to taking a train a few days ago with several friends.  Patient is fully vaccinated against COVID and influenza.  She has been taking over-the-counter cold medications without relief of symptoms.  Patient admits to some chest tightness and shortness of breath associated with her flulike symptoms.  She denies any palpitations, nausea, vomiting, headache, dizziness or syncope.      History   PAST HISTORY   No past medical history on file.  No past surgical history on file.  Social History     Tobacco Use   ? Smoking status: Not on file   ? Smokeless tobacco: Not on file   Substance and Sexual Activity   ? Alcohol use: Not on file   ? Drug use: Not on file   ? Sexual activity: Not on file     No family history on file.     MEDICATION HISTORY   No Known Allergies  None       Physical Exam   PHYSICAL EXAM   ED VITALS:  ED Vitals    Date/Time Temp Pulse Resp BP SpO2 Dosing Weight Pain Rating FiO2 Set (%) Who   07/30/22 0922 98.2 F (36.8 C) 96 18 123/67 99 % 74.5 kg (164 lb 3.9 oz) -- -- BO       Physical Exam  Vitals and nursing note reviewed.   Constitutional:       General: She is not in acute distress.     Appearance: She is well-developed. She is not ill-appearing, toxic-appearing or diaphoretic.   HENT:       Head: Normocephalic and atraumatic.      Right Ear: Tympanic membrane and ear canal normal. Tympanic membrane is not erythematous.      Left Ear: Tympanic membrane and ear canal normal. Tympanic membrane is not erythematous.      Nose: Congestion present.      Mouth/Throat:      Pharynx: Uvula midline. Posterior oropharyngeal erythema present. No oropharyngeal exudate or uvula swelling.      Tonsils: No tonsillar exudate or tonsillar abscesses.      Comments: Canker sores noted on right oropharynx.  Eyes:      Conjunctiva/sclera: Conjunctivae normal.      Pupils: Pupils are equal, round, and reactive to light.   Cardiovascular:      Rate and Rhythm: Normal rate and regular rhythm.      Heart sounds: Normal heart sounds. No murmur heard.     No gallop.   Pulmonary:      Effort: Pulmonary effort is normal.      Breath sounds: Normal breath sounds. No wheezing  or rhonchi.   Abdominal:      General: There is no distension.      Palpations: Abdomen is soft.      Tenderness: There is no abdominal tenderness. There is no guarding.   Musculoskeletal:      Cervical back: Normal range of motion and neck supple.   Lymphadenopathy:      Cervical: Cervical adenopathy present.   Skin:     General: Skin is warm.   Neurological:      General: No focal deficit present.      Mental Status: She is alert and oriented to person, place, and time.   Psychiatric:         Mood and Affect: Mood normal.         Behavior: Behavior normal.         Results   LABORATORY:   Lab Results    None      Labs Reviewed   SARS-COV-2/FLU/RSV PCR, RAPID       IMAGING AND ANCILLARY DATA:     IMAGING   Imaging Studies  Type of X-ray Study 1: cxr  Procedure personally visualized and reviewed by radiologist. No acute disease found.     Imaging Results       XRAY CHEST 2 VIEWS (Final result)  Result time 07/30/22 10:21:32    Final result by Heidi Shape, MD (07/30/22 10:21:32)            Impression:    No radiographic evidence of acute cardiopulmonary  disease.    Reviewed and Interpreted by: Heidi Guile, MD  07/30/2022 10:19 AM          Narrative:       XRAY CHEST 2 VIEWS  07/30/2022 10:10 AM    CLINICAL INDICATION: Productive cough, shortness of breath, and chest tightness.      COMPARISON: None.    FINDINGS:    TUBES/LINES/DEVICES: None present.    VISUALIZED ABDOMEN: Within normal limits.    LUNGS AND PLEURA: No evidence of pulmonary infiltrates, pleural effusion, atelectasis or pneumothorax.    MEDIASTINUM: Within normal limits.    HEART AND PULMONARY VASCULATURE: No evidence of pulmonary vascular congestion. The heart is normal in size and configuration.    OSSEOUS STRUCTURES: The visualized osseous structures are unremarkable.                       ED COURSE     ED Lab and POCT (From admission, onward)    Start Ordered     Status Ordering Provider    07/30/22 [phone] 07/30/22 0946  SARS-CoV-2/Flu/RSV PCR, Rapid  ONE TIME         Final result Heidi Combs [facility]       ED Non Lab Orders (From admission, onward)    None       ED Medication Administration from 07/30/2022 0917 to 07/30/2022 2223     Date/Time Order Dose Route Action Comments    07/30/2022 1141 PST amoxicillin-clavulanate (AUGMENTIN) 875-125 mg tablet 1 tablet 1 tablet Oral Given --    07/30/2022 1125 PST dexamethasone (DECADRON) 4 mg/mL injection 10 mg 10 mg Intramuscular Patient Refused --    07/30/2022 1300 PST ketorolac (TORADOL) 30 mg/mL (1 mL) injection 15 mg 15 mg Intramuscular Given --                 MEDICAL DECISION MAKING:   Heidi Kotyk, PA-C Medical Decision Making:  Heidi Combs is  a 32 year old female with no past medical history who presents to the ED with a complaint of sore throat, runny nose, congestion, bilateral ear ache, hoarseness, chills, body aches and productive cough that began 3 days ago.     Further work up and disposition to be determined by SP. Case discussed with Heidi Combs.     Vital signs are stable.  PE shows congestion, mild erythema of the  oropharynx and canker sores on oropharynx.  Heart and lung sounds are normal.  COVID/RSV/influenza labs ordered and is unremarkable.  Chest x-ray normal.    ED Attending Physician, Heidi Combs, Summary/Discussion/ED Course:  As the Attending Physician, I saw the patient with Wilfrid Lund, PA-C.    I have examined the patient and have reviewed the patient?s presenting complaints history, past medical history, medications, and ED course. I personally provided the substantive portion of the visit and performed the entire medical decision-making component: Uri sx, neg viral swab and cxr unremarkable. Pharynx is erythematous with an ulcerative lesion. Swab for hsv offered but declined. Medrol dose pack with abx recommended. Pt is amenable to abx, but declines steroids stating poor tolerance when given previously.   Vss, nad, appears well. Tolerating po. Strict return precautions advised if new/worsening symptoms arise. To f/u with pmd this week.         History was independently obtained from the following: Patient. Serial exams and reassessments were performed during the patient's ED course.    Vital signs and nursing notes, including patient history in the notes were reviewed. The patient/family was updated and educated on ED test results, the diagnostic impression, and plan of care.           DIAGNOSIS     Diagnoses       Comments    Acute pharyngitis, unspecified etiology    -  Primary     Viral URI              DISPOSITION   Discharged   Follow-up Information     System, Pcp Not In. Go in 1 week.         Go to  [facility].    Specialty: Emergency Medicine  Why: As needed  Contact information  [address]  [place] [address]  [phone]                Discharge Instructions    Return ASAP to ED if your symptoms do not improve or worsen despite management.  Return if you experience worsening pain, fever, chills, shortness of breath, chest pain or palpitations.   Take antibiotics as prescribed for acute pharyngitis.  Follow-up with your PCP in 1 week regarding today's visit.         Discharge Instructions Received    Pharyngitis  Easy-to-Read (English)      New Medication Prescribed in Emergency Department     Medication Sig Dispense Auth. Provider    amoxicillin-clavulanate (Augmentin) 875-125 mg tablet Take 1 tablet by mouth 2 times daily for 10 days. 20 tablet Wilfrid Lund [place], [place]         Note written by: Humberto Leep, PA  07/30/2022  10:23 PM      Electronically signed by Shan Levans, MD at 07/30/2022 10:23 PM PST

## 2022-07-30 NOTE — ED Triage Notes (Signed)
Formatting of this note might be different from the original.  Pt here for sore throat x 3 days. (+) cough, body aches. Pt visiting from out of town. Was supposed to fly out today.   Electronically signed by Gennette Pac, RN at 07/30/2022  9:24 AM PST

## 2022-07-30 NOTE — ED Notes (Signed)
Formatting of this note might be different from the original.  Pt ambulated to bathroom without assistance  Electronically signed by Felicity Pellegrini, RN at 07/30/2022 11:16 AM PST

## 2022-07-30 NOTE — ED Notes (Signed)
Formatting of this note might be different from the original.  First contact with patient: 07/30/2022 9:46 AM    HPI  Heidi Combs is a 32 year old female complaining of sore throat, congestion with green phlegm, cough and body aches x3 days. She also reports night sweats but denies cp or sob. She tested herself for covid but ti was negative. She had a history of strep throat when she was younger but she had her tonsils removed when she was 32 years old. Pt si A&Ox4, breathing is even and unlabored, hoarse voice, able to speak in full sentences.     Initial meet  Introduced myself to patient. All questions and concerns were addressed. Fall precautions maintained. Call bell within reach. Pending MD eval.    Past Medical History  No past medical history on file.    Last Vital Signs   BP: 123/67  Heart Rate: 96  Respiratory Rate: 18  Temp: 98.2 F (36.8 C)  Electronically signed by Felicity Pellegrini, RN at 07/30/2022  9:48 AM PST

## 2022-07-30 NOTE — ED Notes (Signed)
Formatting of this note might be different from the original.  Gave report to Personal assistant to cover for break    Electronically signed by Felicity Pellegrini, RN at 07/30/2022 12:59 PM PST

## 2022-08-02 ENCOUNTER — Encounter: Payer: Managed Care, Other (non HMO) | Admitting: Internal Medicine

## 2022-08-03 ENCOUNTER — Ambulatory Visit
Admit: 2022-08-03 | Discharge: 2022-08-03 | Payer: PRIVATE HEALTH INSURANCE | Attending: Family Medicine | Primary: Family Medicine

## 2022-08-03 ENCOUNTER — Encounter

## 2022-08-03 DIAGNOSIS — R051 Acute cough: Secondary | ICD-10-CM

## 2022-08-03 DIAGNOSIS — K1379 Other lesions of oral mucosa: Secondary | ICD-10-CM

## 2022-08-03 LAB — AMB POC COVID-19 & INFLUENZA A/B
INFLUENZA A RNA, POC: NOT DETECTED
INFLUENZA B RNA, POC: NOT DETECTED
SARS-COV-2 RNA, POC: NEGATIVE

## 2022-08-03 MED ORDER — SPACER/AERO-HOLDING CHAMBERS DEVI
Freq: Every day | 0 refills | Status: DC | PRN
Start: 2022-08-03 — End: 2022-10-15

## 2022-08-03 MED ORDER — ALBUTEROL SULFATE HFA 108 (90 BASE) MCG/ACT IN AERS
108 (90 Base) MCG/ACT | RESPIRATORY_TRACT | 2 refills | Status: AC | PRN
Start: 2022-08-03 — End: 2022-10-15

## 2022-08-03 MED ORDER — IPRATROPIUM-ALBUTEROL 0.5-2.5 (3) MG/3ML IN SOLN
Freq: Once | RESPIRATORY_TRACT | Status: AC
Start: 2022-08-03 — End: 2022-08-03
  Administered 2022-08-03: 21:00:00 1 via RESPIRATORY_TRACT

## 2022-08-03 NOTE — Progress Notes (Unsigned)
CHIEF COMPLAINT:  Chief Complaint   Patient presents with    Chest Congestion    Pharyngitis    Cough        HISTORY OF PRESENT ILLNESS:  Heidi Combs is a 32 y.o. female  who presents ***    PHQ:       No data to display                CURRENT MEDICATION LIST:    Current Outpatient Medications   Medication Sig Dispense Refill    escitalopram (LEXAPRO) 20 MG tablet 1 tablet Orally Once a day for 30 day(s)      Levonorgest-Eth Estrad 91-Day 0.15-0.03 &0.01 MG TABS 1 tablet Orally Once a day for 91 day(s)      valACYclovir (VALTREX) 1 g tablet 2 tablets every 12 hours X 1 day PRN outbreak (Patient not taking: Reported on 06/27/2022) 28 tablet 2    Methylphenidate HCl ER 18 MG TB24 Take 18 mg by mouth every morning. (Patient not taking: Reported on 07/06/2021)      acyclovir (ZOVIRAX) 400 MG tablet Take 400 mg by mouth 3 times daily (Patient not taking: Reported on 06/27/2022)      norethindrone-ethinyl estradiol-iron (MICROGESTIN FE1.5/30) 1.5-30 MG-MCG tablet 1 tablet Orally Once a day (Patient not taking: Reported on 08/10/2021)      buPROPion (WELLBUTRIN SR) 100 MG extended release tablet 1 tablet in the morning Orally Once a day (Patient not taking: Reported on 06/02/2021)      FLUoxetine (PROZAC) 40 MG capsule 1 capsule in the morning Orally Once a day (Patient not taking: Reported on 06/02/2021)      HYDROcodone-acetaminophen (NORCO) 5-325 MG per tablet 1 tablet as needed Orally every 6 hrs (Patient not taking: Reported on 06/02/2021)      Levonorgest-Eth Estrad 91-Day 0.1-0.02 & 0.01 MG TABS 1 tablet Orally Once a day for 91 day(s) (Patient not taking: Reported on 07/06/2021)      LORazepam (ATIVAN) 0.5 MG tablet 1 tablet at bedtime as needed Orally Once a day (Patient not taking: Reported on 06/02/2021)      naproxen sodium (ANAPROX) 220 MG tablet 1 tablet with food or milk as needed Orally every 12 hrs (Patient not taking: Reported on 06/02/2021)      tiZANidine (ZANAFLEX) 4 MG tablet 1/2-1 tablet as needed Orally  Once nightly (Patient not taking: Reported on 06/02/2021)       No current facility-administered medications for this visit.        ALLERGIES:    Allergies   Allergen Reactions    Cefaclor Hives     Other reaction(s): Unknown    Sumatriptan Swelling     Other reaction(s): shortness of breath  angioedema          HISTORY:  No past medical history on file.   No past surgical history on file.   Social History     Socioeconomic History    Marital status: Unknown     Spouse name: Not on file    Number of children: Not on file    Years of education: Not on file    Highest education level: Not on file   Occupational History    Not on file   Tobacco Use    Smoking status: Never    Smokeless tobacco: Never    Tobacco comments:     08/03/2022   Substance and Sexual Activity    Alcohol use: Not on file    Drug  use: Not on file    Sexual activity: Not on file   Other Topics Concern    Not on file   Social History Narrative    Not on file     Social Determinants of Health     Financial Resource Strain: Not on file   Food Insecurity: Not on file   Transportation Needs: Not on file   Physical Activity: Not on file   Stress: Not on file   Social Connections: Not on file   Intimate Partner Violence: Not on file   Housing Stability: Not on file      No family history on file.     REVIEW OF SYSTEMS:  Per HPI. Otherwise balance of 10 point ROS is negative.     PHYSICAL EXAM:  Vital Signs -   Visit Vitals  BP 102/78 (Site: Left Upper Arm, Position: Sitting, Cuff Size: Large Adult)   Pulse 93   Temp 98.4 F (36.9 C) (Oral)   Ht 1.702 m (5\' 7" )   Wt 72.7 kg (160 lb 3.2 oz)   SpO2 98%   BMI 25.09 kg/m        GENERAL APPEARANCE: no acute distress  EYES: no lid lag, stare or proptosis, extraocular movement full and smooth.   EARS: auditory canal clear, tympanic membrane intact, clear, light reflex present.   NOSE: sinuses nontender bilaterally  THROAT: no erythema, no exudate, pharynx normal, tonsils normal.   LYMPH NODES: no cervical  adenopathy  HEART: normal rate and regular rhythm, no rubs, no murmurs, rubs, gallops, or thrills.   LUNGS: clear anteriorly and posteriorly, clear to auscultation bilaterally  SKIN: good turgor.   EXTREMITIES: good capillary refill in nail beds  PERIPHERAL PULSES: 2+ radial.         LABS  No results found for this visit on 08/03/22.  Office Visit on 08/10/2021   Component Date Value Ref Range Status    SARS-CoV-2 08/10/2021 Detected (A)  Not Detected Final    Comment: The cobas SARS-CoV-2 & Influenza A/B Nucleic acid test for use on the cobas  Liat System (cobas SARS-CoV-2 & Influenza A/B) is an automated multiplex  real-time RT-PCR assay intended for the simultaneous rapid in vitro  qualitative detection and differentiation of SARS-CoV-2, influenza A, and  influenza B virus RNA in healthcare provider-collected nasopharyngeal and  nasal swabs, from individuals suspected of respiratory viral infection  consistent with COVID-19 by their healthcare provider.  Cobas SARS-CoV-2 & Influenza A/B is intended for use in the simultaneous rapid  in vitro detection and differentiation of SARS-CoV-2, influenza A virus, and  influenza B virus nucleic acids in clinical specimens and is not intended to  detect influenza C virus. SARS-CoV-2, influenza A and influenza B viral RNA is  generally detectable in respiratory specimens during the acute phase of  infection. Positive results are indicative of active infection but do not rule  out bacterial infection or co-inf                           ection with other pathogens not detected by  the test. Clinical correlation with patient history and other diagnostic  information is necessary to determine patient infection status. The agent  detected may not be the definite cause of disease.  Negative results do not preclude SARS-CoV-2, influenza A and/or influenza B  infection and should not be used as the sole basis for diagnosis, treatment or  other patient management  decisions. Negative  results must be combined with  clinical observations, patient history, and/or epidemiological information.  The cobas SARS-CoV-2 & Influenza A/B Nucleic acid test is only for use under  the Food and Drug Administration's Emergency Use Authorization.      INFLUENZA A 08/10/2021 Not Detected  Not Detected Final    INFLUENZA B 08/10/2021 Not Detected  Not Detected Final       IMPRESSION/PLAN    {There are no diagnoses linked to this encounter. (Refresh or delete this SmartLink)}       Follow up and Dispositions:  No follow-ups on file.       Oda Cogan, MD

## 2022-08-06 NOTE — Telephone Encounter (Signed)
Swab is unlabeled on patient.  Patient needs to be called back in by office staff. Please assist.

## 2022-08-10 ENCOUNTER — Encounter

## 2022-08-10 LAB — CBC WITH AUTO DIFFERENTIAL
Absolute Baso #: 0 10*3/uL (ref 0.0–0.2)
Absolute Eos #: 0.1 10*3/uL (ref 0.0–0.5)
Absolute Lymph #: 1.9 10*3/uL (ref 1.0–3.2)
Absolute Mono #: 0.5 10*3/uL (ref 0.3–1.0)
Basophils %: 0.4 % (ref 0.0–2.0)
Eosinophils %: 0.7 % (ref 0.0–7.0)
Hematocrit: 39.7 % (ref 34.0–47.0)
Hemoglobin: 13.6 g/dL (ref 11.5–15.7)
Immature Grans (Abs): 0.04 10*3/uL (ref 0.00–0.06)
Immature Granulocytes: 0.4 % (ref 0.0–0.6)
Lymphocytes: 19 % (ref 15.0–45.0)
MCH: 30.2 pg (ref 27.0–34.5)
MCHC: 34.3 g/dL (ref 32.0–36.0)
MCV: 88.2 fL (ref 81.0–99.0)
MPV: 10.1 fL (ref 7.2–13.2)
Monocytes %: 5.1 % (ref 4.0–12.0)
NRBC Absolute: 0 10*3/uL (ref 0.000–0.012)
NRBC Automated: 0 % (ref 0.0–0.2)
Neutrophils %: 74.4 % — ABNORMAL HIGH (ref 42.0–74.0)
Neutrophils Absolute: 7.3 10*3/uL (ref 1.6–7.3)
Platelets: 405 10*3/uL (ref 140–440)
RBC: 4.5 x10e6/mcL (ref 3.60–5.20)
RDW: 11.8 % (ref 11.0–16.0)
WBC: 9.8 10*3/uL (ref 3.8–10.6)

## 2022-08-10 LAB — C-REACTIVE PROTEIN: CRP: 0.5 mg/dL (ref 0.00–0.50)

## 2022-08-10 MED ORDER — LIDOCAINE VISCOUS HCL 2 % MT SOLN
2 % | OROMUCOSAL | 1 refills | Status: DC | PRN
Start: 2022-08-10 — End: 2022-10-15

## 2022-08-10 NOTE — Telephone Encounter (Signed)
From: Darlyn Read  To: Dr. Cranford Mon  Sent: 08/10/2022 3:04 PM EST  Subject: Continued Symptoms    Hi Dr Enedina Finner!     My congestion and cough are getting better, but I still have a lot of throat pain. This morning I woke up with severe throat pain on the left side, along with a swollen lymph node on the left side of my throat that is painful to the touch. I don't see any sores in my throat right now- not sure what is going on but wanted to update you.

## 2022-08-11 LAB — ANA SCREEN WITH REFLEX: ANA, DIRECT: NEGATIVE

## 2022-08-11 LAB — SEDIMENTATION RATE: Sed Rate: 5 mm/hr (ref 0–20)

## 2022-08-15 MED ORDER — VALACYCLOVIR HCL 500 MG PO TABS
500 MG | ORAL_TABLET | Freq: Every day | ORAL | 3 refills | Status: AC
Start: 2022-08-15 — End: 2022-10-15

## 2022-08-15 NOTE — Telephone Encounter (Signed)
From: Dr. Cranford Mon  To: Akiyah Eppolito  Sent: 08/14/2022 5:22 PM EST  Subject: Heidi Combs,    Your autoimmune screening was negative. While the oral swab didn't show any obvious Herpes Simplex Virus, it still is possible that's what is causing your oral ulcers. Would you like to trial a daily Valtrex as a preventative to see if it stops these outbreaks? It is a safe medication. If it works, you can keep taking it as a preventative or just as needed. If it doesn't work, at least we've tried it.     Dr. Enedina Finner

## 2022-10-02 ENCOUNTER — Telehealth: Payer: Self-pay

## 2022-10-02 NOTE — Telephone Encounter (Signed)
Dawn Ortiz had called triage line, stating she needed to schedule a new OB appointment with Dr. Marcelline Mates. Nimrit hasn't been here before. She proceeds to tell me that she had blood work done thru [facility], I do not/cannot see them, she states they will be available to her  in 24 hours. I advised her once she got her results to bring them by the office and then get scheduled.

## 2022-10-15 ENCOUNTER — Ambulatory Visit
Admit: 2022-10-15 | Discharge: 2022-10-15 | Payer: PRIVATE HEALTH INSURANCE | Attending: Family Medicine | Primary: Family Medicine

## 2022-10-15 DIAGNOSIS — S161XXA Strain of muscle, fascia and tendon at neck level, initial encounter: Secondary | ICD-10-CM

## 2022-10-15 MED ORDER — METHYLPREDNISOLONE 4 MG PO TBPK
4 | PACK | ORAL | 0 refills | Status: AC
Start: 2022-10-15 — End: 2022-10-21

## 2022-10-15 MED ORDER — TIZANIDINE HCL 2 MG PO TABS
2 MG | ORAL_TABLET | ORAL | 1 refills | Status: DC
Start: 2022-10-15 — End: 2023-07-13

## 2022-10-15 NOTE — Progress Notes (Signed)
CHIEF COMPLAINT:  Chief Complaint   Patient presents with    Neck Injury        HISTORY OF PRESENT ILLNESS:  Heidi Combs is a 32 y.o. female  who presents for acute neck pain.  Patient did not have an injury but felt some sudden neck pain in the past 36 hours.  She woke up with it yesterday.  She also has some radicular pains down the arms which have been resolving.  She had difficulties moving the night yesterday that is improving today.  At baseline, she reports having some discomfort in the rhomboids on the right frequently.    PHQ:       No data to display                CURRENT MEDICATION LIST:    Current Outpatient Medications   Medication Sig Dispense Refill    amoxicillin-clavulanate (AUGMENTIN) 875-125 MG per tablet       cefdinir (OMNICEF) 300 MG capsule       clobetasol (TEMOVATE) 0.05 % external solution       ketoconazole (NIZORAL) 2 % shampoo       predniSONE (DELTASONE) 20 MG tablet prednisone Take 2 tablet (oral) 1 time per day for 3 days Then take 1 tablet QD x 3 days [id number] tablet 1 time per day oral 3 days active 20 mg      methylPREDNISolone (MEDROL DOSEPACK) 4 MG tablet Take by mouth. 1 kit 0    tiZANidine (ZANAFLEX) 2 MG tablet 1-2 tablets PO nightly PRN muscle spasm 30 tablet 1    escitalopram (LEXAPRO) 20 MG tablet 1 tablet Orally Once a day for 30 day(s)      fluconazole (DIFLUCAN) 150 MG tablet TAKE 1 TABLET ORALLY AS A SINGLE DOSE. (Patient not taking: Reported on 10/15/2022)      LORazepam (ATIVAN) 0.5 MG tablet 1 tablet at bedtime as needed Orally Once a day (Patient not taking: Reported on 06/02/2021)       No current facility-administered medications for this visit.        ALLERGIES:    Allergies   Allergen Reactions    Cefaclor Hives     Other reaction(s): Unknown    Sumatriptan Swelling     Other reaction(s): shortness of breath  angioedema          HISTORY:  History reviewed. No pertinent past medical history.   No past surgical history on file.   Social History     Socioeconomic  History    Marital status: Unknown     Spouse name: Not on file    Number of children: Not on file    Years of education: Not on file    Highest education level: Not on file   Occupational History    Not on file   Tobacco Use    Smoking status: Never    Smokeless tobacco: Never    Tobacco comments:     08/03/2022   Substance and Sexual Activity    Alcohol use: Not on file    Drug use: Not on file    Sexual activity: Not on file   Other Topics Concern    Not on file   Social History Narrative    Not on file     Social Determinants of Health     Financial Resource Strain: Not on file   Food Insecurity: Not on file   Transportation Needs: Not on file   Physical Activity: Not  on file   Stress: Not on file   Social Connections: Not on file   Intimate Partner Violence: Not on file   Housing Stability: Not on file      No family history on file.     REVIEW OF SYSTEMS:  Per HPI. Otherwise balance of general, MSK, neuro, GI, GU ROS is negative    PHYSICAL EXAM:  Vital Signs -   Visit Vitals  BP 110/66   Pulse 76   Temp 98.5 F (36.9 C) (Oral)   Ht 1.702 m (5\' 7" )   Wt 71 kg (156 lb 9.6 oz)   SpO2 97%   BMI 24.53 kg/m        GENERAL APPEARANCE: well developed, well nourished, alert and oriented X 3 no acute distress, appropriate affect  NECK: Slight limited range of motion of the neck secondary to pain.  Tenderness to palpation in the right neck musculature.  Tenderness to palpation in the right rhomboids and trapezius.  HEART: normal rate and regular rhythm, no murmurs, rubs, gallops, or thrills  LUNGS: clear anteriorly and posteriorly, clear to auscultation bilaterally  ABDOMEN: bowel sounds present, soft, nontender, nondistended  SKIN: warm and dry.   MUSCULOSKELETAL: cervical spine normal, thoracic spine normal, lumbosacral spine normal  BACK: normal exam of spine, no kyphosis, no scoliosis, spine nontender to palpation  EXTREMITIES: no edema, clubbing or cyanosis.   PERIPHERAL PULSES: palpable in all 4 extremities.    NEUROLOGIC: gait normal, normal strength, tone and reflexes, sensory exam intact        LABS  No results found for this visit on 10/15/22.  Orders Only on 08/10/2022   Component Date Value Ref Range Status    CRP 08/10/2022 0.50  0.00 - 0.50 mg/dL Final    SED RATE METHOD 08/10/2022 Automated   Final    NOTE: [employer] iSED Methodology , effective 12-31-2016    Sed Rate 08/10/2022 5  0 - 20 mm/hr Final    ANA, DIRECT 08/10/2022 Negative  Negative Final    Comment: Performed At: [facility]  [place], [place] [id number]  Rush Farmer MD [id number]      WBC 08/10/2022 9.8  3.8 - 10.6 x10e3/mcL Final    RBC 08/10/2022 4.50  3.60 - 5.20 x10e6/mcL Final    Hemoglobin 08/10/2022 13.6  11.5 - 15.7 g/dL Final    Hematocrit 08/10/2022 39.7  34.0 - 47.0 % Final    MCV 08/10/2022 88.2  81.0 - 99.0 fL Final    MCH 08/10/2022 30.2  27.0 - 34.5 pg Final    MCHC 08/10/2022 34.3  32.0 - 36.0 g/dL Final    RDW 08/10/2022 11.8  11.0 - 16.0 % Final    Platelets 08/10/2022 405  140 - 440 x10e3/mcL Final    MPV 08/10/2022 10.1  7.2 - 13.2 fL Final    NRBC Automated 08/10/2022 0.0  0.0 - 0.2 % Final    NRBC Absolute 08/10/2022 0.000  0.000 - 0.012 x10e3/mcL Final    Neutrophils % 08/10/2022 74.4 (H)  42.0 - 74.0 % Final    Lymphocytes 08/10/2022 19.0  15.0 - 45.0 % Final    Monocytes 08/10/2022 5.1  4.0 - 12.0 % Final    Eosinophils % 08/10/2022 0.7  0.0 - 7.0 % Final    Basophils % 08/10/2022 0.4  0.0 - 2.0 % Final    Neutrophils Absolute 08/10/2022 7.3  1.6 - 7.3 x10e3/mcL Final    Absolute Lymph # 08/10/2022  1.9  1.0 - 3.2 x10e3/mcL Final    Absolute Mono # 08/10/2022 0.5  0.3 - 1.0 x10e3/mcL Final    Absolute Eos # 08/10/2022 0.1  0.0 - 0.5 x10e3/mcL Final    Absolute Baso # 08/10/2022 0.0  0.0 - 0.2 x10e3/mcL Final    Immature Granulocytes 08/10/2022 0.4  0.0 - 0.6 % Final    Immature Grans (Abs) 08/10/2022 0.04  0.00 - 0.06 x10e3/mcL Final       IMPRESSION/PLAN    1. Strain of neck muscle, initial  encounter  -     methylPREDNISolone (MEDROL DOSEPACK) 4 MG tablet; Take by mouth., Disp-1 kit, R-0Normal  -     tiZANidine (ZANAFLEX) 2 MG tablet; 1-2 tablets PO nightly PRN muscle spasm, Disp-30 tablet, R-1Normal  -     [facility] - [facility]       New problem.  Hopefully just a mild strain.  It is possible she has a herniated disc due to the initial radicular symptoms, but as her symptoms are getting better today, I think this is unlikely.  The radicular symptoms likely happened due to the tight muscles yesterday.  Refer to PT and prescribe muscle relaxer and steroid pack.  If not improving or worsening, I advised her to let me know and I will get an MRI of her spine.

## 2022-10-31 ENCOUNTER — Ambulatory Visit: Payer: Managed Care, Other (non HMO)

## 2022-10-31 VITALS — Wt 182.0 lb

## 2022-10-31 DIAGNOSIS — Z3480 Encounter for supervision of other normal pregnancy, unspecified trimester: Secondary | ICD-10-CM | POA: Insufficient documentation

## 2022-10-31 DIAGNOSIS — Z3689 Encounter for other specified antenatal screening: Secondary | ICD-10-CM

## 2022-10-31 DIAGNOSIS — Z131 Encounter for screening for diabetes mellitus: Secondary | ICD-10-CM

## 2022-10-31 DIAGNOSIS — Z369 Encounter for antenatal screening, unspecified: Secondary | ICD-10-CM

## 2022-10-31 DIAGNOSIS — Z130 Encounter for screening for diseases of the blood and blood-forming organs and certain disorders involving the immune mechanism: Secondary | ICD-10-CM

## 2022-10-31 NOTE — Progress Notes (Signed)
New OB Intake  I connected with  [name] on 10/31/22 at  9:15 AM EDT by telephone and verified that I am speaking with the correct person using two identifiers. Nurse is located at [employer] and pt is located at home.  I explained I am completing New OB Intake today. We discussed her EDD of 06/08/2023 that is based on LMP of 09/01/2022. Pt is G1/P0. I reviewed her allergies, medications, Medical/Surgical/OB history, and appropriate screenings. There are no cats. Based on history, this is a/an pregnancy uncomplicated .   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  Anatomy US Explained first scheduled [place] will be scheduled soon and an anatomy scan will be done at 20 weeks.  Labs Discussed genetic screening with patient. Patient desires genetic testing to be drawn with new OB labs. Discussed possible labs to be drawn at new OB appointment.  COVID Vaccine Patient has had COVID vaccine.   Social Determinants of Health Food Insecurity: denies food insecurity Transportation: Patient denies transportation needs.  First visit review I reviewed  new OB appt with pt. I explained she will have ob bloodwork and pap smear/pelvic exam if indicated. Explained pt will be seen by Dr. Rubie Maid at first visit; encounter routed to appropriate provider.   Cleophas Dunker, [facility] 10/31/2022  9:42 AM

## 2022-10-31 NOTE — Patient Instructions (Signed)
Second Trimester of Pregnancy  The second trimester of pregnancy is from week 13 through week 27. This is months 4 through 6 of pregnancy. The second trimester is often a time when you feel your best. Your body has adjusted to being pregnant, and you begin to feel better physically. During the second trimester: Morning sickness has lessened or stopped completely. You may have more energy. You may have an increase in appetite. The second trimester is also a time when the unborn baby (fetus) is growing rapidly. At the end of the sixth month, the fetus may be up to 12 inches long and weigh about 1 pounds. You will likely begin to feel the baby move (quickening) between 16 and 20 weeks of pregnancy. Body changes during your second trimester Your body continues to go through many changes during your second trimester. The changes vary and generally return to normal after the baby is born. Physical changes Your weight will continue to increase. You will notice your lower abdomen bulging out. You may begin to get stretch marks on your hips, abdomen, and breasts. Your breasts will continue to grow and to become tender. Dark spots or blotches (chloasma or mask of pregnancy) may develop on your face. A dark line from your belly button to the pubic area (linea nigra) may appear. You may have changes in your hair. These can include thickening of your hair, rapid growth, and changes in texture. Some people also have hair loss during or after pregnancy, or hair that feels dry or thin. Health changes You may develop headaches. You may have heartburn. You may develop constipation. You may develop hemorrhoids or swollen, bulging veins (varicose veins). Your gums may bleed and may be sensitive to brushing and flossing. You may urinate more often because the fetus is pressing on your bladder. You may have back pain. This is caused by: Weight gain. Pregnancy hormones that are relaxing the joints in your  pelvis. A shift in weight and the muscles that support your balance. Follow these instructions at home: Medicines Follow your health care provider's instructions regarding medicine use. Specific medicines may be either safe or unsafe to take during pregnancy. Do not take any medicines unless approved by your health care provider. Take a prenatal vitamin that contains at least 600 micrograms (mcg) of folic acid. Eating and drinking Eat a healthy diet that includes fresh fruits and vegetables, whole grains, good sources of protein such as meat, eggs, or tofu, and low-fat dairy products. Avoid raw meat and unpasteurized juice, milk, and cheese. These carry germs that can harm you and your baby. You may need to take these actions to prevent or treat constipation: Drink enough fluid to keep your urine pale yellow. Eat foods that are high in fiber, such as beans, whole grains, and fresh fruits and vegetables. Limit foods that are high in fat and processed sugars, such as fried or sweet foods. Activity Exercise only as directed by your health care provider. Most people can continue their usual exercise routine during pregnancy. Try to exercise for 30 minutes at least 5 days a week. Stop exercising if you develop contractions in your uterus. Stop exercising if you develop pain or cramping in the lower abdomen or lower back. Avoid exercising if it is very hot or humid or if you are at a high altitude. Avoid heavy lifting. If you choose to, you may have sex unless your health care provider tells you not to. Relieving pain and discomfort Wear a supportive   bra to prevent discomfort from breast tenderness. Take warm sitz baths to soothe any pain or discomfort caused by hemorrhoids. Use hemorrhoid cream if your health care provider approves. Rest with your legs raised (elevated) if you have leg cramps or low back pain. If you develop varicose veins: Wear support hose as told by your health care  provider. Elevate your feet for 15 minutes, 3-4 times a day. Limit salt in your diet. Safety Wear your seat belt at all times when driving or riding in a car. Talk with your health care provider if someone is verbally or physically abusive to you. Lifestyle Do not use hot tubs, steam rooms, or saunas. Do not douche. Do not use tampons or scented sanitary pads. Avoid cat litter boxes and soil used by cats. These carry germs that can cause birth defects in the baby and possibly loss of the fetus by miscarriage or stillbirth. Do not use herbal remedies, alcohol, illegal drugs, or medicines that are not approved by your health care provider. Chemicals in these products can harm your baby. Do not use any products that contain nicotine or tobacco, such as cigarettes, e-cigarettes, and chewing tobacco. If you need help quitting, ask your health care provider. General instructions During a routine prenatal visit, your health care provider will do a physical exam and other tests. He or she will also discuss your overall health. Keep all follow-up visits. This is important. Ask your health care provider for a referral to a local prenatal education class. Ask for help if you have counseling or nutritional needs during pregnancy. Your health care provider can offer advice or refer you to specialists for help with various needs. Where to find more information American Pregnancy Association: americanpregnancy.org American College of Obstetricians and Gynecologists: acog.org/en/Womens%20Health/Pregnancy Office on Women's Health: womenshealth.gov/pregnancy Contact a health care provider if you have: A headache that does not go away when you take medicine. Vision changes or you see spots in front of your eyes. Mild pelvic cramps, pelvic pressure, or nagging pain in the abdominal area. Persistent nausea, vomiting, or diarrhea. A bad-smelling vaginal discharge or foul-smelling urine. Pain when you  urinate. Sudden or extreme swelling of your face, hands, ankles, feet, or legs. A fever. Get help right away if you: Have fluid leaking from your vagina. Have spotting or bleeding from your vagina. Have severe abdominal cramping or pain. Have difficulty breathing. Have chest pain. Have fainting spells. Have not felt your baby move for the time period told by your health care provider. Have new or increased pain, swelling, or redness in an arm or leg. Summary The second trimester of pregnancy is from week 13 through week 27 (months 4 through 6). Do not use herbal remedies, alcohol, illegal drugs, or medicines that are not approved by your health care provider. Chemicals in these products can harm your baby. Exercise only as directed by your health care provider. Most people can continue their usual exercise routine during pregnancy. Keep all follow-up visits. This is important. This information is not intended to replace advice given to you by your health care provider. Make sure you discuss any questions you have with your health care provider. Document Revised: 12/23/2019 Document Reviewed: 10/29/2019 Elsevier Patient Education  2023 [employer] First Trimester of Pregnancy  The first trimester of pregnancy starts on the first day of your last menstrual period until the end of week 12. This is also called months 1 through 3 of pregnancy. Body changes during your first trimester Your   body goes through many changes during pregnancy. The changes usually return to normal after your baby is born. Physical changes You may gain or lose weight. Your breasts may grow larger and hurt. The area around your nipples may get darker. Dark spots or blotches may develop on your face. You may have changes in your hair. Health changes You may feel like you might vomit (nauseous), and you may vomit. You may have heartburn. You may have headaches. You may have trouble pooping (constipation). Your  gums may bleed. Other changes You may get tired easily. You may pee (urinate) more often. Your menstrual periods will stop. You may not feel hungry. You may want to eat certain kinds of food. You may have changes in your emotions from day to day. You may have more dreams. Follow these instructions at home: Medicines Take over-the-counter and prescription medicines only as told by your doctor. Some medicines are not safe during pregnancy. Take a prenatal vitamin that contains at least 600 micrograms (mcg) of folic acid. Eating and drinking Eat healthy meals that include: Fresh fruits and vegetables. Whole grains. Good sources of protein, such as meat, eggs, or tofu. Low-fat dairy products. Avoid raw meat and unpasteurized juice, milk, and cheese. If you feel like you may vomit, or you vomit: Eat 4 or 5 small meals a day instead of 3 large meals. Try eating a few soda crackers. Drink liquids between meals instead of during meals. You may need to take these actions to prevent or treat trouble pooping: Drink enough fluids to keep your pee (urine) pale yellow. Eat foods that are high in fiber. These include beans, whole grains, and fresh fruits and vegetables. Limit foods that are high in fat and sugar. These include fried or sweet foods. Activity Exercise only as told by your doctor. Most people can do their usual exercise routine during pregnancy. Stop exercising if you have cramps or pain in your lower belly (abdomen) or low back. Do not exercise if it is too hot or too humid, or if you are in a place of great height (high altitude). Avoid heavy lifting. If you choose to, you may have sex unless your doctor tells you not to. Relieving pain and discomfort Wear a good support bra if your breasts are sore. Rest with your legs raised (elevated) if you have leg cramps or low back pain. If you have bulging veins (varicose veins) in your legs: Wear support hose as told by your  doctor. Raise your feet for 15 minutes, 3-4 times a day. Limit salt in your food. Safety Wear your seat belt at all times when you are in a car. Talk with your doctor if someone is hurting you or yelling at you. Talk with your doctor if you are feeling sad or have thoughts of hurting yourself. Lifestyle Do not use hot tubs, steam rooms, or saunas. Do not douche. Do not use tampons or scented sanitary pads. Do not use herbal medicines, illegal drugs, or medicines that are not approved by your doctor. Do not drink alcohol. Do not smoke or use any products that contain nicotine or tobacco. If you need help quitting, ask your doctor. Avoid cat litter boxes and soil that is used by cats. These carry germs that can cause harm to the baby and can cause a loss of your baby by miscarriage or stillbirth. General instructions Keep all follow-up visits. This is important. Ask for help if you need counseling or if you need help with   nutrition. Your doctor can give you advice or tell you where to go for help. Visit your dentist. At home, brush your teeth with a soft toothbrush. Floss gently. Write down your questions. Take them to your prenatal visits. Where to find more information American Pregnancy Association: americanpregnancy.org American College of Obstetricians and Gynecologists: www.acog.org Office on Women's Health: womenshealth.gov/pregnancy Contact a doctor if: You are dizzy. You have a fever. You have mild cramps or pressure in your lower belly. You have a nagging pain in your belly area. You continue to feel like you may vomit, you vomit, or you have watery poop (diarrhea) for 24 hours or longer. You have a bad-smelling fluid coming from your vagina. You have pain when you pee. You are exposed to a disease that spreads from person to person, such as chickenpox, measles, Zika virus, HIV, or hepatitis. Get help right away if: You have spotting or bleeding from your vagina. You have  very bad belly cramping or pain. You have shortness of breath or chest pain. You have any kind of injury, such as from a fall or a car crash. You have new or increased pain, swelling, or redness in an arm or leg. Summary The first trimester of pregnancy starts on the first day of your last menstrual period until the end of week 12 (months 1 through 3). Eat 4 or 5 small meals a day instead of 3 large meals. Do not smoke or use any products that contain nicotine or tobacco. If you need help quitting, ask your doctor. Keep all follow-up visits. This information is not intended to replace advice given to you by your health care provider. Make sure you discuss any questions you have with your health care provider. Document Revised: 12/23/2019 Document Reviewed: 10/29/2019 Elsevier Patient Education  2023 [employer] Commonly Asked Questions During Pregnancy  Cats: A parasite can be excreted in cat feces.  To avoid exposure you need to have another person empty the little box.  If you must empty the litter box you will need to wear gloves.  Wash your hands after handling your cat.  This parasite can also be found in raw or undercooked meat so this should also be avoided.  Colds, Sore Throats, Flu: Please check your medication sheet to see what you can take for symptoms.  If your symptoms are unrelieved by these medications please call the office.  Dental Work: Most any dental work your dentist recommends is permitted.  X-rays should only be taken during the first trimester if absolutely necessary.  Your abdomen should be shielded with a lead apron during all x-rays.  Please notify your provider prior to receiving any x-rays.  Novocaine is fine; gas is not recommended.  If your dentist requires a note from us prior to dental work please call the office and we will provide one for you.  Exercise: Exercise is an important part of staying healthy during your pregnancy.  You may continue most exercises  you were accustomed to prior to pregnancy.  Later in your pregnancy you will most likely notice you have difficulty with activities requiring balance like riding a bicycle.  It is important that you listen to your body and avoid activities that put you at a higher risk of falling.  Adequate rest and staying well hydrated are a must!  If you have questions about the safety of specific activities ask your provider.    Exposure to Children with illness: Try to avoid obvious exposure; report any   symptoms to us when noted,  If you have chicken pos, red measles or mumps, you should be immune to these diseases.   Please do not take any vaccines while pregnant unless you have checked with your OB provider.  Fetal Movement: After 28 weeks we recommend you do "kick counts" twice daily.  Lie or sit down in a calm quiet environment and count your baby movements "kicks".  You should feel your baby at least 10 times per hour.  If you have not felt 10 kicks within the first hour get up, walk around and have something sweet to eat or drink then repeat for an additional hour.  If count remains less than 10 per hour notify your provider.  Fumigating: Follow your pest control agent's advice as to how long to stay out of your home.  Ventilate the area well before re-entering.  Hemorrhoids:   Most over-the-counter preparations can be used during pregnancy.  Check your medication to see what is safe to use.  It is important to use a stool softener or fiber in your diet and to drink lots of liquids.  If hemorrhoids seem to be getting worse please call the office.   Hot Tubs:  Hot tubs Jacuzzis and saunas are not recommended while pregnant.  These increase your internal body temperature and should be avoided.  Intercourse:  Sexual intercourse is safe during pregnancy as long as you are comfortable, unless otherwise advised by your provider.  Spotting may occur after intercourse; report any bright red bleeding that is heavier  than spotting.  Labor:  If you know that you are in labor, please go to the hospital.  If you are unsure, please call the office and let us help you decide what to do.  Lifting, straining, etc:  If your job requires heavy lifting or straining please check with your provider for any limitations.  Generally, you should not lift items heavier than that you can lift simply with your hands and arms (no back muscles)  Painting:  Paint fumes do not harm your pregnancy, but may make you ill and should be avoided if possible.  Latex or water based paints have less odor than oils.  Use adequate ventilation while painting.  Permanents & Hair Color:  Chemicals in hair dyes are not recommended as they cause increase hair dryness which can increase hair loss during pregnancy.  " Highlighting" and permanents are allowed.  Dye may be absorbed differently and permanents may not hold as well during pregnancy.  Sunbathing:  Use a sunscreen, as skin burns easily during pregnancy.  Drink plenty of fluids; avoid over heating.  Tanning Beds:  Because their possible side effects are still unknown, tanning beds are not recommended.  Ultrasound Scans:  Routine ultrasounds are performed at approximately 20 weeks.  You will be able to see your baby's general anatomy an if you would like to know the gender this can usually be determined as well.  If it is questionable when you conceived you may also receive an ultrasound early in your pregnancy for dating purposes.  Otherwise ultrasound exams are not routinely performed unless there is a medical necessity.  Although you can request a scan we ask that you pay for it when conducted because insurance does not cover " patient request" scans.  Work: If your pregnancy proceeds without complications you may work until your due date, unless your physician or employer advises otherwise.  Round Ligament Pain/Pelvic Discomfort:  Sharp, shooting pains not associated   with bleeding are  fairly common, usually occurring in the second trimester of pregnancy.  They tend to be worse when standing up or when you remain standing for long periods of time.  These are the result of pressure of certain pelvic ligaments called "round ligaments".  Rest, Tylenol and heat seem to be the most effective relief.  As the womb and fetus grow, they rise out of the pelvis and the discomfort improves.  Please notify the office if your pain seems different than that described.  It may represent a more serious condition.  Common Medications Safe in Pregnancy  Acne:      Constipation:  Benzoyl Peroxide     Colace  Clindamycin      Dulcolax Suppository  Topica Erythromycin     Fibercon  Salicylic Acid      Metamucil         Miralax AVOID:        Senakot   Accutane    Cough:  Retin-A       Cough Drops  Tetracycline      Phenergan w/ Codeine if Rx  Minocycline      Robitussin (Plain & DM)  Antibiotics:     Crabs/Lice:  Ceclor       RID  Cephalosporins    AVOID:  E-Mycins      Kwell  Keflex  Macrobid/Macrodantin   Diarrhea:  Penicillin      Kao-Pectate  Zithromax      Imodium AD         PUSH FLUIDS AVOID:       Cipro     Fever:  Tetracycline      Tylenol (Regular or Extra  Minocycline       Strength)  Levaquin      Extra Strength-Do not          Exceed 8 tabs/24 hrs Caffeine:        <200mg/day (equiv. To 1 cup of coffee or  approx. 3 12 oz sodas)         Gas: Cold/Hayfever:       Gas-X  Benadryl      Mylicon  Claritin       Phazyme  **Claritin-D        Chlor-Trimeton    Headaches:  Dimetapp      ASA-Free Excedrin  Drixoral-Non-Drowsy     Cold Compress  Mucinex (Guaifenasin)     Tylenol (Regular or Extra  Sudafed/Sudafed-12 Hour     Strength)  **Sudafed PE Pseudoephedrine   Tylenol Cold & Sinus     Vicks Vapor Rub  Zyrtec  **AVOID if Problems With Blood Pressure         Heartburn: Avoid lying down for at least 1 hour after meals  Aciphex      Maalox     Rash:  Milk of  Magnesia     Benadryl    Mylanta       1% Hydrocortisone Cream  Pepcid  Pepcid Complete   Sleep Aids:  Prevacid      Ambien   Prilosec       Benadryl  Rolaids       Chamomile Tea  Tums (Limit 4/day)     Unisom         Tylenol PM         Warm milk-add vanilla or  Hemorrhoids:       Sugar for taste  Anusol/Anusol H.C.  (RX: Analapram 2.5%)  Sugar Substitutes:    Hydrocortisone OTC     Ok in moderation  Preparation H      Tucks        Vaseline lotion applied to tissue with wiping    Herpes:     Throat:  Acyclovir      Oragel  Famvir  Valtrex     Vaccines:         Flu Shot Leg Cramps:       *Gardasil  Benadryl      Hepatitis A         Hepatitis B Nasal Spray:       Pneumovax  Saline Nasal Spray     Polio Booster         Tetanus Nausea:       Tuberculosis test or PPD  Vitamin B6 25 mg TID   AVOID:    Dramamine      *Gardasil  Emetrol       Live Poliovirus  Ginger Root 250 mg QID    MMR (measles, mumps &  High Complex Carbs @ Bedtime    rebella)  Sea Bands-Accupressure    Varicella (Chickenpox)  Unisom 1/2 tab TID     *No known complications           If received before Pain:         Known pregnancy;   Darvocet       Resume series after  Lortab        Delivery  Percocet    Yeast:   Tramadol      Femstat  Tylenol 3      Gyne-lotrimin  Ultram       Monistat  Vicodin           MISC:         All Sunscreens           Hair Coloring/highlights          Insect Repellant's          (Including DEET)         Mystic Tans  

## 2022-11-21 ENCOUNTER — Other Ambulatory Visit: Payer: Managed Care, Other (non HMO)

## 2022-11-21 ENCOUNTER — Ambulatory Visit: Payer: Managed Care, Other (non HMO)

## 2022-11-21 ENCOUNTER — Other Ambulatory Visit (HOSPITAL_COMMUNITY)
Admission: RE | Admit: 2022-11-21 | Discharge: 2022-11-21 | Disposition: A | Discharge status: Home / Self Care | Payer: Managed Care, Other (non HMO) | Source: Ambulatory Visit | Attending: Obstetrics and Gynecology | Admitting: Obstetrics and Gynecology

## 2022-11-21 DIAGNOSIS — Z369 Encounter for antenatal screening, unspecified: Secondary | ICD-10-CM | POA: Insufficient documentation

## 2022-11-21 DIAGNOSIS — Z3480 Encounter for supervision of other normal pregnancy, unspecified trimester: Secondary | ICD-10-CM | POA: Diagnosis not present

## 2022-11-21 DIAGNOSIS — Z3687 Encounter for antenatal screening for uncertain dates: Secondary | ICD-10-CM | POA: Diagnosis not present

## 2022-11-21 DIAGNOSIS — Z3A12 12 weeks gestation of pregnancy: Secondary | ICD-10-CM | POA: Diagnosis not present

## 2022-11-21 DIAGNOSIS — Z131 Encounter for screening for diabetes mellitus: Secondary | ICD-10-CM

## 2022-11-21 DIAGNOSIS — Z130 Encounter for screening for diseases of the blood and blood-forming organs and certain disorders involving the immune mechanism: Secondary | ICD-10-CM

## 2022-11-22 LAB — URINALYSIS, ROUTINE W REFLEX MICROSCOPIC
Bilirubin, UA: NEGATIVE
Glucose, UA: NEGATIVE
Ketones, UA: NEGATIVE
Leukocytes,UA: NEGATIVE
Nitrite, UA: NEGATIVE
Protein,UA: NEGATIVE
RBC, UA: NEGATIVE
Specific Gravity, UA: 1.01 (ref 1.005–1.030)
Urobilinogen, Ur: 0.2 mg/dL (ref 0.2–1.0)
pH, UA: 6.5 (ref 5.0–7.5)

## 2022-11-23 LAB — MONITOR DRUG PROFILE 14(MW)
Amphetamine Scrn, Ur: NEGATIVE ng/mL
BARBITURATE SCREEN URINE: NEGATIVE ng/mL
BENZODIAZEPINE SCREEN, URINE: NEGATIVE ng/mL
Buprenorphine, Urine: NEGATIVE ng/mL
CANNABINOIDS UR QL SCN: NEGATIVE ng/mL
Cocaine (Metab) Scrn, Ur: NEGATIVE ng/mL
Creatinine(Crt), U: 42.4 mg/dL (ref 20.0–300.0)
Fentanyl, Urine: NEGATIVE pg/mL
Meperidine Screen, Urine: NEGATIVE ng/mL
Methadone Screen, Urine: NEGATIVE ng/mL
OXYCODONE+OXYMORPHONE UR QL SCN: NEGATIVE ng/mL
Opiate Scrn, Ur: NEGATIVE ng/mL
Ph of Urine: 6.3 (ref 4.5–8.9)
Phencyclidine Qn, Ur: NEGATIVE ng/mL
Propoxyphene Scrn, Ur: NEGATIVE ng/mL
SPECIFIC GRAVITY: 1.011
Tramadol Screen, Urine: NEGATIVE ng/mL

## 2022-11-23 LAB — CULTURE, OB URINE

## 2022-11-23 LAB — URINE CYTOLOGY ANCILLARY ONLY
Chlamydia: NEGATIVE
Comment: NEGATIVE
Comment: NORMAL
Neisseria Gonorrhea: NEGATIVE

## 2022-11-23 LAB — NICOTINE SCREEN, URINE: Cotinine Ql Scrn, Ur: NEGATIVE ng/mL

## 2022-11-23 LAB — URINE CULTURE, OB REFLEX

## 2022-11-27 LAB — HGB FRACTIONATION CASCADE
Hgb A2: 2.7 % (ref 1.8–3.2)
Hgb A: 97.3 % (ref 96.4–98.8)
Hgb F: 0 % (ref 0.0–2.0)
Hgb S: 0 %

## 2022-11-27 LAB — CBC/D/PLT+RPR+RH+ABO+RUBIGG...
Antibody Screen: NEGATIVE
Basophils Absolute: 0 10*3/uL (ref 0.0–0.2)
Basos: 0 %
EOS (ABSOLUTE): 0 10*3/uL (ref 0.0–0.4)
Eos: 0 %
HCV Ab: NONREACTIVE
HIV Screen 4th Generation wRfx: NONREACTIVE
Hematocrit: 34.3 % (ref 34.0–46.6)
Hemoglobin: 11.1 g/dL (ref 11.1–15.9)
Hepatitis B Surface Ag: NEGATIVE
Immature Grans (Abs): 0 10*3/uL (ref 0.0–0.1)
Immature Granulocytes: 0 %
Lymphocytes Absolute: 2.4 10*3/uL (ref 0.7–3.1)
Lymphs: 30 %
MCH: 25.4 pg — ABNORMAL LOW (ref 26.6–33.0)
MCHC: 32.4 g/dL (ref 31.5–35.7)
MCV: 79 fL (ref 79–97)
Monocytes Absolute: 0.5 10*3/uL (ref 0.1–0.9)
Monocytes: 6 %
Neutrophils Absolute: 5.1 10*3/uL (ref 1.4–7.0)
Neutrophils: 64 %
Platelets: 291 10*3/uL (ref 150–450)
RBC: 4.37 x10E6/uL (ref 3.77–5.28)
RDW: 15.1 % (ref 11.7–15.4)
RPR Ser Ql: NONREACTIVE
Rh Factor: POSITIVE
Rubella Antibodies, IGG: 4.32 index (ref >0.99)
Varicella zoster IgG: <135 index — ABNORMAL LOW (ref >165)
WBC: 8.1 10*3/uL (ref 3.4–10.8)

## 2022-11-27 LAB — HEMOGLOBIN A1C
Est. average glucose Bld gHb Est-mCnc: 186 mg/dL
Hgb A1c MFr Bld: 8.1 % — ABNORMAL HIGH (ref 4.8–5.6)

## 2022-11-27 LAB — HCV INTERPRETATION

## 2022-11-28 ENCOUNTER — Encounter: Payer: Self-pay | Admitting: Obstetrics and Gynecology

## 2022-11-28 ENCOUNTER — Ambulatory Visit: Payer: Managed Care, Other (non HMO) | Admitting: Obstetrics and Gynecology

## 2022-11-28 ENCOUNTER — Other Ambulatory Visit (HOSPITAL_COMMUNITY)
Admission: RE | Admit: 2022-11-28 | Discharge: 2022-11-28 | Disposition: A | Discharge status: Home / Self Care | Payer: Managed Care, Other (non HMO) | Source: Ambulatory Visit | Attending: Obstetrics and Gynecology | Admitting: Obstetrics and Gynecology

## 2022-11-28 VITALS — BP 132/84 | HR 129 | Ht 62.0 in | Wt 191.3 lb

## 2022-11-28 DIAGNOSIS — I152 Hypertension secondary to endocrine disorders: Secondary | ICD-10-CM

## 2022-11-28 DIAGNOSIS — Z3490 Encounter for supervision of normal pregnancy, unspecified, unspecified trimester: Secondary | ICD-10-CM

## 2022-11-28 DIAGNOSIS — O0990 Supervision of high risk pregnancy, unspecified, unspecified trimester: Secondary | ICD-10-CM

## 2022-11-28 DIAGNOSIS — E66811 Obesity, class 1: Secondary | ICD-10-CM

## 2022-11-28 DIAGNOSIS — Z794 Long term (current) use of insulin: Secondary | ICD-10-CM

## 2022-11-28 DIAGNOSIS — E669 Obesity, unspecified: Secondary | ICD-10-CM

## 2022-11-28 DIAGNOSIS — O09619 Supervision of young primigravida, unspecified trimester: Secondary | ICD-10-CM | POA: Diagnosis not present

## 2022-11-28 DIAGNOSIS — K219 Gastro-esophageal reflux disease without esophagitis: Secondary | ICD-10-CM

## 2022-11-28 DIAGNOSIS — O24111 Pre-existing diabetes mellitus, type 2, in pregnancy, first trimester: Secondary | ICD-10-CM | POA: Diagnosis not present

## 2022-11-28 DIAGNOSIS — Z3A12 12 weeks gestation of pregnancy: Secondary | ICD-10-CM | POA: Diagnosis present

## 2022-11-28 DIAGNOSIS — O99211 Obesity complicating pregnancy, first trimester: Secondary | ICD-10-CM

## 2022-11-28 DIAGNOSIS — Z124 Encounter for screening for malignant neoplasm of cervix: Secondary | ICD-10-CM | POA: Insufficient documentation

## 2022-11-28 DIAGNOSIS — O99341 Other mental disorders complicating pregnancy, first trimester: Secondary | ICD-10-CM

## 2022-11-28 DIAGNOSIS — Z2839 Other underimmunization status: Secondary | ICD-10-CM | POA: Insufficient documentation

## 2022-11-28 DIAGNOSIS — E1165 Type 2 diabetes mellitus with hyperglycemia: Secondary | ICD-10-CM

## 2022-11-28 DIAGNOSIS — O99611 Diseases of the digestive system complicating pregnancy, first trimester: Secondary | ICD-10-CM

## 2022-11-28 DIAGNOSIS — E1159 Type 2 diabetes mellitus with other circulatory complications: Secondary | ICD-10-CM

## 2022-11-28 DIAGNOSIS — F418 Other specified anxiety disorders: Secondary | ICD-10-CM

## 2022-11-28 DIAGNOSIS — O09899 Supervision of other high risk pregnancies, unspecified trimester: Secondary | ICD-10-CM | POA: Insufficient documentation

## 2022-11-28 HISTORY — DX: Supervision of high risk pregnancy, unspecified, unspecified trimester: O09.90

## 2022-11-28 LAB — MATERNIT 21 PLUS CORE, BLOOD
Fetal Fraction: 6
Result (T21): NEGATIVE
Trisomy 13 (Patau syndrome): NEGATIVE
Trisomy 18 (Edwards syndrome): NEGATIVE
Trisomy 21 (Down syndrome): NEGATIVE

## 2022-11-28 LAB — POCT URINALYSIS DIPSTICK OB
Bilirubin, UA: NEGATIVE
Glucose, UA: NEGATIVE
Ketones, UA: NEGATIVE
Leukocytes, UA: NEGATIVE
Nitrite, UA: NEGATIVE
Spec Grav, UA: 1.025 (ref 1.010–1.025)
Urobilinogen, UA: 0.2 E.U./dL
pH, UA: 5 (ref 5.0–8.0)

## 2022-11-28 NOTE — Patient Instructions (Signed)
WHAT OB PATIENTS CAN EXPECT  Confirmation of pregnancy and ultrasound ordered if medically indicated-[redacted] weeks gestation New OB (NOB) intake with nurse and New OB (NOB) labs- [redacted] weeks gestation New OB (NOB) physical examination with provider- 11/[redacted] weeks gestation Flu vaccine-[redacted] weeks gestation Anatomy scan-[redacted] weeks gestation Glucose tolerance test, blood work to test for anemia, T-dap vaccine-[redacted] weeks gestation Vaginal swabs/cultures-STD/Group B strep-[redacted] weeks gestation Appointments every 4 weeks until 28 weeks Every 2 weeks from 28 weeks until 36 weeks Weekly visits from 36 weeks until delivery   

## 2022-11-28 NOTE — Progress Notes (Addendum)
OBSTETRIC INITIAL PRENATAL VISIT  Subjective:    Dawn Ortiz is being seen today for her first obstetrical visit.  This is a planned pregnancy. She is a 32 y.o. G1P0 female at [redacted]w[redacted]d gestation, Estimated Date of Delivery: 06/08/23 with Patient's last menstrual period was 09/01/2022 (exact date). consistent with 12 week sono. Her obstetrical history is significant for  Type II diabetes, hypertension (noted in chart, however patient denies h/o having HTN), acid reflux, and obesity (BMI 34) . Relationship with FOB: spouse, living together. Patient does intend to breast feed. Pregnancy history fully reviewed.    OB History  Gravida Para Term Preterm AB Living  1 0 0 0 0 0  SAB IAB Ectopic Multiple Live Births  0 0 0 0 0    # Outcome Date GA Lbr Len/2nd Weight Sex Delivery Anes PTL Lv  1 Current             Gynecologic History:  Last pap smear was 10/21/2017.  Results were Normal.  denies h/o abnormal pap smears in the past.  Denies history of STIs.  Contraception prior to conception: None   Past Medical History:  Diagnosis Date   Abscess of left breast    Abscess of left thigh    Abscess of right breast    Abscess of right thigh    Abscess of the breast and nipple 03/02/2019   Anxiety    Axillary abscess 07/02/2016   Biliary colic    Diabetes mellitus without complication (HCC)    GERD (gastroesophageal reflux disease)    Leukocytosis 07/19/2019   Migraines     Family History  Problem Relation Age of Onset   Healthy Mother    Depression Father    Hypertension Father    Diabetes Father    Heart disease Father    Sarcoidosis Father    Healthy Brother    Obesity Brother    Diabetes Maternal Grandmother    Diabetes Paternal Grandmother    Cancer Paternal Grandfather        colon and also siblings had cancer ?type   Hypertension Other     Past Surgical History:  Procedure Laterality Date   CHOLECYSTECTOMY     ESOPHAGOGASTRODUODENOSCOPY (EGD) WITH PROPOFOL N/A  10/01/2019   Procedure: ESOPHAGOGASTRODUODENOSCOPY (EGD) WITH PROPOFOL;  Surgeon: Toney Reil, MD;  Location: [facility];  Service: Gastroenterology;  Laterality: N/A;   I & D EXTREMITY Left 03/05/2019   Procedure: Irrigation and debridement;  Surgeon: Henrene Dodge, MD;  Location: [facility];  Service: General;  Laterality: Left;   INCISION AND DRAINAGE     right breast x1 and left groin x1 and left axilla   INCISION AND DRAINAGE ABSCESS Left 07/02/2016   Procedure: INCISION AND DRAINAGE ABSCESS;  Surgeon: Lattie Haw, MD;  Location: [facility];  Service: General;  Laterality: Left;   INCISION AND DRAINAGE ABSCESS Left 04/18/2017   Procedure: INCISION AND DRAINAGE ABSCESS-LEFT GROIN;  Surgeon: Ancil Linsey, MD;  Location: [facility];  Service: General;  Laterality: Left;   INCISION AND DRAINAGE ABSCESS Right 05/21/2018   Procedure: INCISION AND DRAINAGE ABSCESS- RIGHT THIGH;  Surgeon: Henrene Dodge, MD;  Location: [facility];  Service: General;  Laterality: Right;   INCISION AND DRAINAGE ABSCESS Left 08/23/2020   Procedure: INCISION AND DRAINAGE ABSCESS, axillary;  Surgeon: Henrene Dodge, MD;  Location: [facility];  Service: General;  Laterality: Left;  provider requesting 2 hours /120 minutes for procedure   IRRIGATION AND DEBRIDEMENT ABSCESS  Right 04/09/2018   Procedure: IRRIGATION AND DEBRIDEMENT ABSCESS;  Surgeon: Henrene Dodge, MD;  Location: [facility];  Service: General;  Laterality: Right;   IRRIGATION AND DEBRIDEMENT ABSCESS Left 05/21/2018   Procedure: IRRIGATION AND DEBRIDEMENT BREAST ABSCESS;  Surgeon: Henrene Dodge, MD;  Location: [facility];  Service: General;  Laterality: Left;   IRRIGATION AND DEBRIDEMENT ABSCESS Right 01/06/2020   Procedure: MINOR INCISION AND DRAINAGE OF ABSCESS, right axillary;  Surgeon: Campbell Lerner, MD;  Location: [facility];  Service: General;  Laterality: Right;   PILONIDAL CYST EXCISION  12/03/2014   Procedure: CYST EXCISION PILONIDAL  EXTENSIVE;  Surgeon: Duwaine Maxin, MD;  Location: [facility];  Service: General;;   WISDOM TOOTH EXTRACTION     fourl; sometime 2017    Social History   Socioeconomic History   Marital status: Married    Spouse name: Johnnie   Number of children: 0   Years of education: 16   Highest education level: Not on file  Occupational History   Occupation: [employer]  Tobacco Use   Smoking status: Former    Types: Cigarettes   Smokeless tobacco: Never  Vaping Use   Vaping Use: Never used  Substance and Sexual Activity   Alcohol use: Not Currently    Alcohol/week: 1.0 - 2.0 standard drink of alcohol    Types: 1 - 2 Glasses of wine per week    Comment: none last 24hrs   Drug use: No   Sexual activity: Yes    Partners: Male    Birth control/protection: None  Other Topics Concern   Not on file  Social History Narrative   DPR mom Piera Downs [phone]    Dad Nimrat Woolworth [phone]    No kids    Works for [employer]   Social Determinants of Health   Financial Resource Strain: Medium Risk (10/31/2022)   Overall Financial Resource Strain (CARDIA)    Difficulty of Paying Living Expenses: Somewhat hard  Food Insecurity: No Food Insecurity (10/31/2022)   Hunger Vital Sign    Worried About Running Out of Food in the Last Year: Never true    Ran Out of Food in the Last Year: Never true  Transportation Needs: No Transportation Needs (10/31/2022)   PRAPARE - Administrator, Civil Service (Medical): No    Lack of Transportation (Non-Medical): No  Physical Activity: Insufficiently Active (10/31/2022)   Exercise Vital Sign    Days of Exercise per Week: 2 days    Minutes of Exercise per Session: 30 min  Stress: No Stress Concern Present (10/31/2022)   [employer] of Occupational Health - Occupational Stress Questionnaire    Feeling of Stress : Not at all  Social Connections: Moderately Integrated (10/31/2022)   Social Connection and Isolation Panel [NHANES]    Frequency  of Communication with Friends and Family: More than three times a week    Frequency of Social Gatherings with Friends and Family: Once a week    Attends Religious Services: More than 4 times per year    Active Member of [employer] or Organizations: No    Attends Banker Meetings: Never    Marital Status: Married  Catering manager Violence: Not At Risk (10/31/2022)   Humiliation, Afraid, Rape, and Kick questionnaire    Fear of Current or Ex-Partner: No    Emotionally Abused: No    Physically Abused: No    Sexually Abused: No    Current Outpatient Medications on File Prior to Visit  Medication Sig Dispense Refill   B-D ULTRAFINE III SHORT PEN 31G X 8 MM MISC      buPROPion (WELLBUTRIN XL) 300 MG 24 hr tablet 1 tablet in the morning Orally Once a day (Patient not taking: Reported on 10/31/2022)     Clindamycin Phosphate foam Apply to affected areas 1-2 times daily as needed for outbreaks. 100 g 3   FLUoxetine (PROZAC) 20 MG capsule Take 1 capsule (20 mg total) by mouth daily. 30 capsule 1   Insulin Degludec (TRESIBA) 100 UNIT/ML SOLN Inject 20 Units into the skin daily.      insulin lispro (HUMALOG) 100 UNIT/ML KwikPen Inject 100 Units into the skin 3 (three) times daily.     omeprazole (PRILOSEC) 40 MG capsule Take 1 capsule (40 mg total) by mouth daily. 30 minutes before food 90 capsule 3   PRESCRIPTION MEDICATION 1 Device by Does not apply route as directed.     No current facility-administered medications on file prior to visit.    Allergies  Allergen Reactions   Sulfa Antibiotics Hives     Review of Systems General: Not Present- Fever, Weight Loss and Weight Gain. Skin: Not Present- Rash. HEENT: Not Present- Blurred Vision, Headache and Bleeding Gums. Respiratory: Not Present- Difficulty Breathing. Breast: Not Present- Breast Mass. Cardiovascular: Not Present- Chest Pain, Elevated Blood Pressure, Fainting / Blacking Out and Shortness of Breath. Gastrointestinal: Not  Present- Abdominal Pain, Constipation, Nausea and Vomiting. Female Genitourinary: Not Present- Frequency, Painful Urination, Pelvic Pain, Vaginal Bleeding, Vaginal Discharge, Contractions, regular, Fetal Movements Decreased, Urinary Complaints and Vaginal Fluid. Musculoskeletal: Not Present- Back Pain and Leg Cramps. Neurological: Not Present- Dizziness. Psychiatric: Not Present- Depression.     Objective:   Last menstrual period 09/01/2022.  There is no height or weight on file to calculate BMI.  General Appearance:    Alert, cooperative, no distress, appears stated age  Head:    Normocephalic, without obvious abnormality, atraumatic  Eyes:    PERRL, conjunctiva/corneas clear, EOM's intact, both eyes  Ears:    Normal external ear canals, both ears  Nose:   Nares normal, septum midline, mucosa normal, no drainage or sinus tenderness  Throat:   Lips, mucosa, and tongue normal; teeth and gums normal  Neck:   Supple, symmetrical, trachea midline, no adenopathy; thyroid: no enlargement/tenderness/nodules; no carotid bruit or JVD  Back:     Symmetric, no curvature, ROM normal, no CVA tenderness  Lungs:     Clear to auscultation bilaterally, respirations unlabored  Chest Wall:    No tenderness or deformity   Heart:    Regular rate and rhythm, S1 and S2 normal, no murmur, rub or gallop  Breast Exam:    No tenderness, masses, or nipple abnormality  Abdomen:     Soft, non-tender, bowel sounds active all four quadrants, no masses, no organomegaly.  FHT 159  bpm.  Genitalia:    Pelvic:external genitalia normal, vagina without lesions, discharge, or tenderness, rectovaginal septum  normal. Cervix normal in appearance, no cervical motion tenderness, no adnexal masses or tenderness.  Pregnancy positive findings: uterine enlargement: 12 wk size, nontender.   Rectal:    Normal external sphincter.  No hemorrhoids appreciated. Internal exam not done.   Extremities:   Extremities normal, atraumatic, no  cyanosis or edema  Pulses:   2+ and symmetric all extremities  Skin:   Skin color, texture, turgor normal, no rashes or lesions  Lymph nodes:   Cervical, supraclavicular, and axillary nodes normal  Neurologic:  CNII-XII intact, normal strength, sensation and reflexes throughout     Assessment:   1. Encounter for supervision of high risk young primigravida, antepartum   2. [redacted] weeks gestation of pregnancy   3. Type 2 diabetes mellitus with hyperglycemia, with long-term current use of insulin (HCC)   4. Hypertension associated with diabetes (HCC)   5. Gastroesophageal reflux disease without esophagitis   6. Depression with anxiety   7. Obesity (BMI 30.0-34.9)   8. Susceptible to varicella (non-immune), currently pregnant     Plan:   Supervision of first high risk pregnancy  - Initial labs reviewed. - Prenatal vitamins encouraged. - Problem list reviewed and updated. - New OB counseling:  The patient has been given an overview regarding routine prenatal care.  Recommendations regarding diet, weight gain, and exercise in pregnancy were given. - Prenatal testing, optional genetic testing, and ultrasound use in pregnancy were reviewed.  Traditional genetic screening vs cell-fee DNA genetic screening discussed, including risks and benefits. Testing  pending, MaterniTi 21 . - Benefits of Breast Feeding were discussed. The patient is encouraged to consider nursing her baby post partum.   2. Type 2 diabetes mellitus with hyperglycemia, with long-term current use of insulin (HCC) -Hemoglobin A1c was 8.1 at new OB labs.  Patient notes that she was previously on Ozempic prior to pregnancy and blood sugars were better controlled (had gotten A1c down to 6) however discontinue the medication when she discovered she was pregnant.  Has been seen by endocrinologist to has changed her medications and patient is working to get her diabetes back under control.  Blood sugars will be continued to be monitored  by endocrinology. -Patient appears to be overdue for yearly diabetic eye and foot exams, needs to be scheduled. -Recommend initiation of daily baby aspirin 81 mg. -Will refer to MFM for fetal anatomic survey and scheduling of fetal echo.  3. Hypertension associated with diabetes (HCC) -Hypertension noted in patient's chart, however reports that she does not have this diagnosis.  Will monitor during the pregnancy.  Patient is at risk for development of PIH due to her history of diabetes and current pregnancy. - Consider ordering baseline PIH labs by 20 weeks.   4. Gastroesophageal reflux disease without esophagitis - Has a history of reflux, was previously managing without medications however notes that her reflux has slowly started to worsen recently.  Has restarted taking her omeprazole as needed. 5. Depression with anxiety -Patient reports remote history of depression and anxiety.  Has history of use of Wellbutrin in the past.  Currently denies any issues with symptoms.  I discussed that she is at risk for development of mood disorders during the pregnancy and postpartum.  Patient notes understanding.  Baseline GAD and PHQ-9 performed today.  6. Obesity (BMI 30.0-34.9) - To advise to limit weight gain to no more than 20 to 25 pounds this pregnancy.  7. Susceptible to varicella (non-immune), currently pregnant -Discussed nonimmune status, encourage patient to follow-up with vaccination postpartum.  Follow up in 4 weeks.    Hildred Laser, MD [facility]

## 2022-11-28 NOTE — Addendum Note (Signed)
Addended by: Kathlene Cote on: 11/28/2022 12:41 PM   Modules accepted: Orders

## 2022-11-29 ENCOUNTER — Encounter: Payer: Self-pay | Admitting: Obstetrics and Gynecology

## 2022-12-04 LAB — CYTOLOGY - PAP
Comment: NEGATIVE
Diagnosis: NEGATIVE
High risk HPV: NEGATIVE

## 2022-12-06 ENCOUNTER — Encounter: Payer: Self-pay | Admitting: Obstetrics and Gynecology

## 2022-12-06 ENCOUNTER — Other Ambulatory Visit: Payer: Self-pay | Admitting: Obstetrics and Gynecology

## 2022-12-06 MED ORDER — METRONIDAZOLE 500 MG PO TABS: 500.0000 mg | ORAL_TABLET | Freq: Two times a day (BID) | ORAL | 0 refills | Status: DC

## 2022-12-10 ENCOUNTER — Telehealth: Payer: Self-pay

## 2022-12-26 ENCOUNTER — Encounter: Payer: Self-pay | Admitting: Obstetrics and Gynecology

## 2022-12-26 ENCOUNTER — Ambulatory Visit: Payer: Managed Care, Other (non HMO) | Admitting: Obstetrics and Gynecology

## 2022-12-26 VITALS — BP 122/78 | HR 112 | Wt 187.8 lb

## 2022-12-26 DIAGNOSIS — Z1379 Encounter for other screening for genetic and chromosomal anomalies: Secondary | ICD-10-CM

## 2022-12-26 DIAGNOSIS — Z794 Long term (current) use of insulin: Secondary | ICD-10-CM

## 2022-12-26 DIAGNOSIS — E1165 Type 2 diabetes mellitus with hyperglycemia: Secondary | ICD-10-CM

## 2022-12-26 DIAGNOSIS — I152 Hypertension secondary to endocrine disorders: Secondary | ICD-10-CM

## 2022-12-26 DIAGNOSIS — E1159 Type 2 diabetes mellitus with other circulatory complications: Secondary | ICD-10-CM

## 2022-12-26 DIAGNOSIS — Z3A16 16 weeks gestation of pregnancy: Secondary | ICD-10-CM

## 2022-12-26 DIAGNOSIS — O09619 Supervision of young primigravida, unspecified trimester: Secondary | ICD-10-CM

## 2022-12-26 DIAGNOSIS — O24112 Pre-existing diabetes mellitus, type 2, in pregnancy, second trimester: Secondary | ICD-10-CM

## 2022-12-26 DIAGNOSIS — O99412 Diseases of the circulatory system complicating pregnancy, second trimester: Secondary | ICD-10-CM

## 2022-12-26 LAB — POCT URINALYSIS DIPSTICK OB
Bilirubin, UA: NEGATIVE
Blood, UA: NEGATIVE
Glucose, UA: NEGATIVE
Ketones, UA: NEGATIVE
Leukocytes, UA: NEGATIVE
Nitrite, UA: NEGATIVE
POC,PROTEIN,UA: NEGATIVE
Spec Grav, UA: 1.025 (ref 1.010–1.025)
Urobilinogen, UA: 0.2 E.U./dL
pH, UA: 7.5 (ref 5.0–8.0)

## 2022-12-26 NOTE — Progress Notes (Signed)
ROB: Reports she is doing well.  She says her fastings are generally around 100 sometimes as high as 120 though.  She reports postprandials less than 175 usually in the 150 range.  We have discussed tighter diabetic control during pregnancy as her endocrinologist follows her sugars and manages her insulin.  Patient has some autonomy regarding dosing and she will try for tighter control as I recommend fastings less than 105 and if possible postprandials at least less than 140. She is taking baby aspirin-2 a day.  She is scheduled for fetal anatomy and fetal echo with MFM in 4 weeks.  Likely early delivery and third trimester fetal monitoring discussed.

## 2022-12-26 NOTE — Progress Notes (Signed)
ROB. Patient states doing well, having some round ligament pain occasionally. Reports continuing to check sugars. Anatomy ultrasound and fetal echo on 01/16/23 with MFM.

## 2023-01-01 LAB — AFP, SERUM, OPEN SPINA BIFIDA
AFP MoM: 0.65
AFP Value: 18.2 ng/mL
Gest. Age on Collection Date: 16.6 weeks
Maternal Age At EDD: 32 yr
OSBR Risk 1 IN: 10000
Test Results:: NEGATIVE
Weight: 188 [lb_av]

## 2023-01-16 ENCOUNTER — Other Ambulatory Visit: Payer: Self-pay

## 2023-01-16 ENCOUNTER — Ambulatory Visit: Payer: Managed Care, Other (non HMO) | Attending: Obstetrics and Gynecology

## 2023-01-16 DIAGNOSIS — Z363 Encounter for antenatal screening for malformations: Secondary | ICD-10-CM | POA: Diagnosis not present

## 2023-01-16 DIAGNOSIS — Z3A19 19 weeks gestation of pregnancy: Secondary | ICD-10-CM | POA: Insufficient documentation

## 2023-01-16 DIAGNOSIS — O24112 Pre-existing diabetes mellitus, type 2, in pregnancy, second trimester: Secondary | ICD-10-CM | POA: Diagnosis present

## 2023-01-16 DIAGNOSIS — Z794 Long term (current) use of insulin: Secondary | ICD-10-CM

## 2023-01-16 DIAGNOSIS — O99212 Obesity complicating pregnancy, second trimester: Secondary | ICD-10-CM

## 2023-01-16 DIAGNOSIS — E1159 Type 2 diabetes mellitus with other circulatory complications: Secondary | ICD-10-CM

## 2023-01-16 DIAGNOSIS — E669 Obesity, unspecified: Secondary | ICD-10-CM | POA: Diagnosis not present

## 2023-01-16 DIAGNOSIS — E119 Type 2 diabetes mellitus without complications: Secondary | ICD-10-CM

## 2023-01-22 ENCOUNTER — Ambulatory Visit: Admit: 2023-01-22 | Discharge: 2023-01-22 | Primary: Family Medicine

## 2023-01-22 ENCOUNTER — Encounter

## 2023-01-22 DIAGNOSIS — M545 Low back pain, unspecified: Secondary | ICD-10-CM

## 2023-01-22 LAB — COMPREHENSIVE METABOLIC PANEL
ALT: 20 U/L (ref 0–35)
AST: 24 U/L (ref 0–35)
Albumin/Globulin Ratio: 2 (ref 1.00–2.70)
Albumin: 4.6 g/dL (ref 3.5–5.2)
Alk Phosphatase: 61 U/L (ref 35–117)
Anion Gap: 9 mmol/L (ref 2–17)
BUN: 11 mg/dL (ref 6–20)
CO2: 29 mmol/L (ref 22–29)
Calcium: 9.6 mg/dL (ref 8.5–10.7)
Chloride: 103 mmol/L (ref 98–107)
Creatinine: 0.7 mg/dL (ref 0.5–1.0)
Est, Glom Filt Rate: 119 mL/min/1.73m (ref >=60)
Globulin: 2.3 g/dL (ref 1.9–4.4)
Glucose: 101 mg/dL — ABNORMAL HIGH (ref 70–99)
Osmolaliy Calculated: 281 mOsm/kg (ref 270–287)
Potassium: 4 mmol/L (ref 3.5–5.3)
Sodium: 141 mmol/L (ref 135–145)
Total Bilirubin: 0.31 mg/dL (ref 0.00–1.20)
Total Protein: 6.9 g/dL (ref 5.7–8.3)

## 2023-01-22 LAB — CBC WITH AUTO DIFFERENTIAL
Basophils %: 0.4 % (ref 0.0–2.0)
Basophils Absolute: 0 10*3/uL (ref 0.0–0.2)
Eosinophils %: 1.1 % (ref 0.0–7.0)
Eosinophils Absolute: 0.1 10*3/uL (ref 0.0–0.5)
Hematocrit: 38.9 % (ref 34.0–47.0)
Hemoglobin: 13.4 g/dL (ref 11.5–15.7)
Immature Grans (Abs): 0.02 10*3/uL (ref 0.00–0.06)
Immature Granulocytes %: 0.3 % (ref 0.0–0.6)
Lymphocytes Absolute: 2.2 10*3/uL (ref 1.0–3.2)
Lymphocytes: 31.2 % (ref 15.0–45.0)
MCH: 30.7 pg (ref 27.0–34.5)
MCHC: 34.4 g/dL (ref 32.0–36.0)
MCV: 89.2 fL (ref 81.0–99.0)
MPV: 10.9 fL (ref 7.2–13.2)
Monocytes %: 6.7 % (ref 4.0–12.0)
Monocytes Absolute: 0.5 10*3/uL (ref 0.3–1.0)
NRBC Absolute: 0 10*3/uL (ref 0.000–0.012)
NRBC Automated: 0 % (ref 0.0–0.2)
Neutrophils %: 60.3 % (ref 42.0–74.0)
Neutrophils Absolute: 4.3 10*3/uL (ref 1.6–7.3)
Platelets: 298 10*3/uL (ref 140–440)
RBC: 4.36 x10e6/mcL (ref 3.60–5.20)
RDW: 12 % (ref 11.0–16.0)
WBC: 7.1 10*3/uL (ref 3.8–10.6)

## 2023-01-22 LAB — AMB POC URINALYSIS DIP STICK AUTO W/O MICRO
Bilirubin, Urine, POC: NEGATIVE
Blood (UA POC): NEGATIVE
Glucose, Urine, POC: NEGATIVE
Ketones, Urine, POC: NEGATIVE
Leukocyte Esterase, Urine, POC: NEGATIVE
Nitrite, Urine, POC: NEGATIVE
Protein, Urine, POC: NEGATIVE
Specific Gravity, Urine, POC: 1.02 (ref 1.001–1.035)
Urobilinogen, POC: 0.2
pH, Urine, POC: 7 (ref 4.6–8.0)

## 2023-01-22 LAB — AMB POC URINE PREGNANCY TEST, VISUAL COLOR COMPARISON: Valid Internal Control, POC: NEGATIVE

## 2023-01-22 MED ORDER — IBUPROFEN 800 MG PO TABS
800 MG | ORAL_TABLET | Freq: Three times a day (TID) | ORAL | 0 refills | Status: DC | PRN
Start: 2023-01-22 — End: 2023-08-30

## 2023-01-22 NOTE — Telephone Encounter (Signed)
Spoke with patient and she is having some lower back and abdominal pain x 2 days. She also has not been urinating as much as usual. Advised patient to come in for appointment. She is scheduled with me for this afternoon.

## 2023-01-22 NOTE — Telephone Encounter (Signed)
PT CALLED THINKS SHE HAS A KIDNEY INFECTION AND WOULD LIKE A CALL BACK TO DISCUSS.    PLEASE ADVISE

## 2023-01-22 NOTE — Telephone Encounter (Signed)
Patient returning call from office. Please assist.

## 2023-01-22 NOTE — Progress Notes (Signed)
Heidi Combs (DOB:  07-22-1991) is a 32 y.o. female, Established patient is here for evaluation of the following chief complaint(s):   Chief Complaint   Patient presents with    Other     Back and abdominal pain think that she may have a kidney infection pain  starts in the back and radiated to the right side started on Sunday  with really bad pain urinating less         History of Present Illness  Heidi Combs is a 32 y.o. female  who presents with 6/10 lower back pain that radiates to her right mid abdomen with associated nausea x two days. The pain comes in waves lasting 30-40 minutes and does not radiate into her lower extremity. Nothing seems to worsen the pain. She has improvement in pain with taking acetaminophen, ibuprofen, and Zanaflex. She has also noticed that she is urinating less frequently and is unsure if she is completely emptying her bladder. Denies any fevers, chills, hematuria, dysuria, numbness/tingling in extremities, incontinence, vomiting, diarrhea. No back injuries, but she has been working out and weight lifting the last few weeks.  Past surgical history significant for removal of her appendix and gallbladder many years ago. She has never had a kidney stone before, but has a significant family history of them. Her last bowel movement was yesterday and was normal. She has a history of ovarian cysts, but states that this feels different.     Prior to Visit Medications    Medication Sig Taking? Authorizing Provider   amoxicillin-clavulanate (AUGMENTIN) 875-125 MG per tablet   [provider]   cefdinir (OMNICEF) 300 MG capsule   [provider]   clobetasol (TEMOVATE) 0.05 % external solution   [provider]   fluconazole (DIFLUCAN) 150 MG tablet TAKE 1 TABLET ORALLY AS A SINGLE DOSE.  Patient not taking: Reported on 10/15/2022  [provider]   ketoconazole (NIZORAL) 2 % shampoo   [provider]   predniSONE (DELTASONE) 20 MG tablet  prednisone Take 2 tablet (oral) 1 time per day for 3 days Then take 1 tablet QD x 3 days 20231024 tablet 1 time per day oral 3 days active 20 mg  [provider]   tiZANidine (ZANAFLEX) 2 MG tablet 1-2 tablets PO nightly PRN muscle spasm  Flandry, Andrew C, MD   escitalopram (LEXAPRO) 20 MG tablet 1 tablet Orally Once a day for 30 day(s)  [facility], [facility], MD   LORazepam (ATIVAN) 0.5 MG tablet 1 tablet at bedtime as needed Orally Once a day  Patient not taking: Reported on 06/02/2021  [facility], [facility], MD         No past medical history on file.      No past surgical history on file.    No family history on file.     Social History     Socioeconomic History    Marital status: Unknown     Spouse name: None    Number of children: None    Years of education: None    Highest education level: None   Tobacco Use    Smoking status: Never    Smokeless tobacco: Never    Tobacco comments:     01 /11/2022        Allergies   Allergen Reactions    Cefaclor Hives and Other (See Comments)     Other reaction(s): Unknown    Sumatriptan Swelling     Other reaction(s): shortness  of breath  angioedema           Review of Systems   See HPI for pertinent positives and negatives.    BP 100/64   Pulse 77   Temp 98.6 F (37 C)   Ht 1.702 m (5\' 7" )   Wt 71.5 kg (157 lb 9.6 oz)   SpO2 97%   BMI 24.68 kg/m       LABS  Lab Results   Component Value Date    WBC 9.8 08/10/2022    HGB 13.6 08/10/2022    HCT 39.7 08/10/2022    MCV 88.2 08/10/2022    PLT 405 08/10/2022     Lab Results   Component Value Date/Time    NA 140 09/18/2019 11:15 AM    K 4.6 09/18/2019 11:15 AM    CL 104 09/18/2019 11:15 AM    CO2 26 09/18/2019 11:15 AM    BUN 8 09/18/2019 11:15 AM    CREATININE 0.6 09/18/2019 11:15 AM    GLUCOSE 91 09/18/2019 11:15 AM    CALCIUM 9.7 09/18/2019 11:15 AM      Lab Results   Component Value Date    CHOL 234 (H) 09/18/2019     Lab Results   Component Value Date    TRIG 191 (H) 09/18/2019      Lab Results   Component Value Date    HDL 46 (L) 09/18/2019     No components found for: "LDLCHOLESTEROL", "LDLCALC"  Lab Results   Component Value Date    VLDL 38.2 09/18/2019     Lab Results   Component Value Date    CHOLHDLRATIO 5.1 (H) 09/18/2019     No results found for: "PSA"  Lab Results   Component Value Date    TSH 1.690 09/18/2019           Physical Exam  Vitals and nursing note reviewed.   Constitutional:       General: She is not in acute distress.     Appearance: Normal appearance. She is not ill-appearing, toxic-appearing or diaphoretic.   HENT:      Head: Normocephalic and atraumatic.      Mouth/Throat:      Mouth: No oral lesions.   Eyes:      General: No scleral icterus.     Conjunctiva/sclera: Conjunctivae normal.   Cardiovascular:      Rate and Rhythm: Normal rate and regular rhythm.      Heart sounds: Normal heart sounds.   Pulmonary:      Effort: Pulmonary effort is normal. No respiratory distress.      Breath sounds: Normal breath sounds.   Abdominal:      General: Abdomen is flat. Bowel sounds are normal. There is no distension.      Palpations: There is no mass.      Tenderness: There is abdominal tenderness in the right upper quadrant, right lower quadrant, epigastric area and periumbilical area. There is no right CVA tenderness, left CVA tenderness, guarding or rebound.   Musculoskeletal:      Cervical back: Neck supple.      Lumbar back: Normal. No swelling, deformity, signs of trauma, tenderness or bony tenderness. Normal range of motion.      Right lower leg: No edema.      Left lower leg: No edema.   Skin:     General: Skin is warm and dry.      Findings: No bruising.   Neurological:  General: No focal deficit present.      Mental Status: She is alert.      Sensory: No sensory deficit.      Motor: No weakness.      Gait: Gait normal.   Psychiatric:         Mood and Affect: Mood normal.         Behavior: Behavior normal.         Thought Content: Thought content normal.            Assessment/Plan  1. Acute right-sided low back pain without sciatica  -     AMB POC URINALYSIS DIP STICK AUTO W/O MICRO  -     AMB POC URINE PREGNANCY TEST, VISUAL COLOR COMPARISON  -     CBC with Auto Differential; Future  -     Lipase; Future  -     Comprehensive Metabolic Panel; Future  -     ibuprofen (ADVIL;MOTRIN) 800 MG tablet; Take 1 tablet by mouth every 8 hours as needed for Pain (take with food), Disp-15 tablet, R-0Normal  2. Right upper quadrant abdominal pain  -     AMB POC URINALYSIS DIP STICK AUTO W/O MICRO  -     AMB POC URINE PREGNANCY TEST, VISUAL COLOR COMPARISON  -     CBC with Auto Differential; Future  -     Lipase; Future  -     Comprehensive Metabolic Panel; Future  -     ibuprofen (ADVIL;MOTRIN) 800 MG tablet; Take 1 tablet by mouth every 8 hours as needed for Pain (take with food), Disp-15 tablet, R-0Normal    UA normal in office. Negative urine pregnancy test in office. Lower back pain/abdominal pain is a new problem and moderate risk. No acute distress, vitals stable. Discussed possible etiology such as muscular strain, kidney stone, ovarian cyst, kidney cyst, pancreatitis, other. Will check some stat labs for further evaluation. Ibuprofen every 8 hours with food as needed for pain. Will hold off on imaging for now since patient is self pay and currently stable with improvement in her symptoms since onset. ER precautions given for any severe abdominal pain.    Assessment & Plan     No follow-ups on file.       Health Maintenance Due   Topic Date Due    Hepatitis B vaccine (1 of 3 - 3-dose series) Never done    Varicella vaccine (1 of 2 - 2-dose childhood series) Never done    Depression Screen  Never done    HPV vaccine (2 - 2-dose series) 01/27/2005    HIV screen  Never done    Hepatitis C screen  Never done    DTaP/Tdap/Td vaccine (1 - Tdap) Never done    Cervical cancer screen  Never done    COVID-19 Vaccine (4 - 2023-24 season) 03/30/2022       Knox Saliva, PA      This  note was generated completely or in part utilizing Dragon dictation speech recognition software.  Occasionally, words are mistranscribed and despite editing, the text may contain inaccuracies due to incorrect word recognition.  If further clarification is needed please contact the office at [phone]

## 2023-01-22 NOTE — Telephone Encounter (Signed)
Attempted to call patient but was unable to reach her and unable to leave a voicemail. Will try again later today.

## 2023-01-22 NOTE — Telephone Encounter (Signed)
Patient is returning call from office        Please assist.

## 2023-01-23 ENCOUNTER — Ambulatory Visit (INDEPENDENT_AMBULATORY_CARE_PROVIDER_SITE_OTHER): Payer: Managed Care, Other (non HMO) | Admitting: Obstetrics

## 2023-01-23 VITALS — BP 115/75 | HR 111 | Wt 191.0 lb

## 2023-01-23 DIAGNOSIS — Z794 Long term (current) use of insulin: Secondary | ICD-10-CM

## 2023-01-23 DIAGNOSIS — O24112 Pre-existing diabetes mellitus, type 2, in pregnancy, second trimester: Secondary | ICD-10-CM

## 2023-01-23 DIAGNOSIS — O0992 Supervision of high risk pregnancy, unspecified, second trimester: Secondary | ICD-10-CM

## 2023-01-23 DIAGNOSIS — O09619 Supervision of young primigravida, unspecified trimester: Secondary | ICD-10-CM

## 2023-01-23 DIAGNOSIS — Z3400 Encounter for supervision of normal first pregnancy, unspecified trimester: Secondary | ICD-10-CM

## 2023-01-23 DIAGNOSIS — Z3A20 20 weeks gestation of pregnancy: Secondary | ICD-10-CM

## 2023-01-23 DIAGNOSIS — E119 Type 2 diabetes mellitus without complications: Secondary | ICD-10-CM

## 2023-01-23 DIAGNOSIS — E669 Obesity, unspecified: Secondary | ICD-10-CM

## 2023-01-23 LAB — POCT URINALYSIS DIPSTICK OB
Bilirubin, UA: NEGATIVE
Blood, UA: NEGATIVE
Glucose, UA: NEGATIVE
Ketones, UA: NEGATIVE
Leukocytes, UA: NEGATIVE
Nitrite, UA: NEGATIVE
POC,PROTEIN,UA: NEGATIVE
Spec Grav, UA: 1.02 (ref 1.010–1.025)
Urobilinogen, UA: 0.2 E.U./dL
pH, UA: 6 (ref 5.0–8.0)

## 2023-01-23 LAB — LIPASE: Lipase: 41 U/L (ref 13–60)

## 2023-01-23 NOTE — Telephone Encounter (Signed)
Attempted to call patient but was unable to reach her and unable to leave voicemail.

## 2023-01-23 NOTE — Telephone Encounter (Signed)
Patient is requesting a call to discuss labs. Please assist

## 2023-01-23 NOTE — Assessment & Plan Note (Signed)
-  Has serial growth scans scheduled with MFM -Needs fetal echo - MFM ordered but Roylene has not been contacted yet -Continue current insulin regimen

## 2023-01-23 NOTE — Assessment & Plan Note (Signed)
-  Anticipatory guidance about the second trimester -Encouraged CBE -Reviewed s/s of PTL

## 2023-01-23 NOTE — Progress Notes (Signed)
    Return Prenatal Note   Assessment/Plan   Plan  32 y.o. G1P0 at [redacted]w[redacted]d presents for follow-up OB visit. Reviewed prenatal record including previous visit note.  Diabetes mellitus without complication (HCC) -Has serial growth scans scheduled with MFM -Needs fetal echo - MFM ordered but Mannie has not been contacted yet -Continue current insulin regimen  Encounter for supervision of high risk young primigravida, antepartum -Anticipatory guidance about the second trimester -Encouraged CBE -Reviewed s/s of PTL    Orders Placed This Encounter  Procedures   POC Urinalysis Dipstick OB   Return in about 4 weeks (around 02/20/2023).   Future Appointments  Date Time Provider Department Center  02/20/2023  9:35 AM Doreene Burke, CNM [facility] None  02/20/2023  3:00 PM [facility]  03/27/2023  9:00 AM [facility]    For next visit:  continue with routine prenatal care     Subjective   32 y.o. G1P0 at [redacted]w[redacted]d presents for this follow-up prenatal visit.  Darionna is feeling well. Her fasting sugars have been mostly 70-90, with just a few over 100. Her PP sugars have been 140 or below. She is staying active and feeling good fetal movement. She denies cramping, ctx, and LOF.  Patient reports: Movement: Present Contractions: Not present  Objective   Flow sheet Vitals: Pulse Rate: (!) 111 BP: 115/75 Total weight gain: 11 lb (4.99 kg)  General Appearance  No acute distress, well appearing, and well nourished Pulmonary   Normal work of breathing Neurologic   Alert and oriented to person, place, and time Psychiatric   Mood and affect within normal limits  Glenetta Borg, CNM  01/22/2409:06 AM

## 2023-01-25 NOTE — Telephone Encounter (Signed)
LVM informing patient that several members of Dr. Esmond Plants staff have been trying to get in touch with her in order to discuss her lab results. Advised patient to call office back so that someone could go over those lab results with her.

## 2023-02-19 ENCOUNTER — Other Ambulatory Visit: Payer: Self-pay

## 2023-02-19 DIAGNOSIS — E1165 Type 2 diabetes mellitus with hyperglycemia: Secondary | ICD-10-CM

## 2023-02-19 DIAGNOSIS — E1159 Type 2 diabetes mellitus with other circulatory complications: Secondary | ICD-10-CM

## 2023-02-19 DIAGNOSIS — E66811 Obesity, class 1: Secondary | ICD-10-CM

## 2023-02-19 DIAGNOSIS — I152 Hypertension secondary to endocrine disorders: Secondary | ICD-10-CM

## 2023-02-19 DIAGNOSIS — E669 Obesity, unspecified: Secondary | ICD-10-CM

## 2023-02-19 DIAGNOSIS — Z794 Long term (current) use of insulin: Secondary | ICD-10-CM

## 2023-02-20 ENCOUNTER — Other Ambulatory Visit: Payer: Self-pay

## 2023-02-20 ENCOUNTER — Ambulatory Visit: Payer: Managed Care, Other (non HMO) | Attending: Maternal & Fetal Medicine

## 2023-02-20 ENCOUNTER — Ambulatory Visit: Payer: Managed Care, Other (non HMO) | Admitting: Certified Nurse Midwife

## 2023-02-20 ENCOUNTER — Encounter: Payer: Self-pay | Admitting: Certified Nurse Midwife

## 2023-02-20 VITALS — BP 133/84 | HR 105 | Wt 197.0 lb

## 2023-02-20 DIAGNOSIS — O99212 Obesity complicating pregnancy, second trimester: Secondary | ICD-10-CM | POA: Diagnosis not present

## 2023-02-20 DIAGNOSIS — Z3A24 24 weeks gestation of pregnancy: Secondary | ICD-10-CM | POA: Diagnosis present

## 2023-02-20 DIAGNOSIS — O321XX0 Maternal care for breech presentation, not applicable or unspecified: Secondary | ICD-10-CM | POA: Insufficient documentation

## 2023-02-20 DIAGNOSIS — E1165 Type 2 diabetes mellitus with hyperglycemia: Secondary | ICD-10-CM | POA: Diagnosis not present

## 2023-02-20 DIAGNOSIS — O24112 Pre-existing diabetes mellitus, type 2, in pregnancy, second trimester: Secondary | ICD-10-CM | POA: Diagnosis present

## 2023-02-20 DIAGNOSIS — O0992 Supervision of high risk pregnancy, unspecified, second trimester: Secondary | ICD-10-CM

## 2023-02-20 DIAGNOSIS — E669 Obesity, unspecified: Secondary | ICD-10-CM | POA: Diagnosis not present

## 2023-02-20 DIAGNOSIS — I152 Hypertension secondary to endocrine disorders: Secondary | ICD-10-CM

## 2023-02-20 DIAGNOSIS — Z794 Long term (current) use of insulin: Secondary | ICD-10-CM

## 2023-02-20 LAB — POCT URINALYSIS DIPSTICK OB
Bilirubin, UA: NEGATIVE
Blood, UA: NEGATIVE
Glucose, UA: NEGATIVE
Ketones, UA: NEGATIVE
Leukocytes, UA: NEGATIVE
Nitrite, UA: NEGATIVE
Spec Grav, UA: 1.015 (ref 1.010–1.025)
Urobilinogen, UA: 0.2 E.U./dL
pH, UA: 6 (ref 5.0–8.0)

## 2023-02-20 NOTE — Patient Instructions (Signed)
Oral Glucose Tolerance Test During Pregnancy Why am I having this test? The oral glucose tolerance test (OGTT) is done to check how your body processes blood sugar (glucose). This is one of several tests used to diagnose diabetes that develops during pregnancy (gestational diabetes mellitus). Gestational diabetes is a short-term form of diabetes that some women develop while they are pregnant. It usually occurs during the second trimester of pregnancy and goes away after delivery. Testing, or screening, for gestational diabetes usually occurs at weeks 24-28 of pregnancy. You may have the OGTT test after having a 1-hour glucose screening test if the results from that test indicate that you may have gestational diabetes. This test may also be needed if: You have a history of gestational diabetes. There is a history of giving birth to very large babies or of losing pregnancies (having stillbirths). You have signs and symptoms of diabetes, such as: Changes in your eyesight. Tingling or numbness in your hands or feet. Changes in hunger, thirst, and urination, and these are not explained by your pregnancy. What is being tested? This test measures the amount of glucose in your blood at different times during a period of 3 hours. This shows how well your body can process glucose. What kind of sample is taken?  Blood samples are required for this test. They are usually collected by inserting a needle into a blood vessel. How do I prepare for this test? For 3 days before your test, eat normally. Have plenty of carbohydrate-rich foods. Follow instructions from your health care provider about: Eating or drinking restrictions on the day of the test. You may be asked not to eat or drink anything other than water (to fast) starting 8-10 hours before the test. Changing or stopping your regular medicines. Some medicines may interfere with this test. Tell a health care provider about: All medicines you are  taking, including vitamins, herbs, eye drops, creams, and over-the-counter medicines. Any blood disorders you have. Any surgeries you have had. Any medical conditions you have. What happens during the test? First, your blood glucose will be measured. This is referred to as your fasting blood glucose because you fasted before the test. Then, you will drink a glucose solution that contains a certain amount of glucose. Your blood glucose will be measured again 1, 2, and 3 hours after you drink the solution. This test takes about 3 hours to complete. You will need to stay at the testing location during this time. During the testing period: Do not eat or drink anything other than the glucose solution. Do not exercise. Do not use any products that contain nicotine or tobacco, such as cigarettes, e-cigarettes, and chewing tobacco. These can affect your test results. If you need help quitting, ask your health care provider. The testing procedure may vary among health care providers and hospitals. How are the results reported? Your results will be reported as milligrams of glucose per deciliter of blood (mg/dL) or millimoles per liter (mmol/L). There is more than one source for screening and diagnosis reference values used to diagnose gestational diabetes. Your health care provider will compare your results to normal values that were established after testing a large group of people (reference values). Reference values may vary among labs and hospitals. For this test (Carpenter-Coustan), reference values are: Fasting: 95 mg/dL (5.3 mmol/L). 1 hour: 180 mg/dL (10.0 mmol/L). 2 hour: 155 mg/dL (8.6 mmol/L). 3 hour: 140 mg/dL (7.8 mmol/L). What do the results mean? Results below the reference values are   considered normal. If two or more of your blood glucose levels are at or above the reference values, you may be diagnosed with gestational diabetes. If only one level is high, your health care provider may  suggest repeat testing or other tests to confirm a diagnosis. Talk with your health care provider about what your results mean. Questions to ask your health care provider Ask your health care provider, or the department that is doing the test: When will my results be ready? How will I get my results? What are my treatment options? What other tests do I need? What are my next steps? Summary The oral glucose tolerance test (OGTT) is one of several tests used to diagnose diabetes that develops during pregnancy (gestational diabetes mellitus). Gestational diabetes is a short-term form of diabetes that some women develop while they are pregnant. You may have the OGTT test after having a 1-hour glucose screening test if the results from that test show that you may have gestational diabetes. You may also have this test if you have any symptoms or risk factors for this type of diabetes. Talk with your health care provider about what your results mean. This information is not intended to replace advice given to you by your health care provider. Make sure you discuss any questions you have with your health care provider. Document Revised: 02/20/2022 Document Reviewed: 12/24/2019 Elsevier Patient Education  2024 Elsevier Inc.  

## 2023-02-20 NOTE — Progress Notes (Signed)
ROB doing well, feeling good movement. Fasting blood sugars ranging 83-110. 2 hr pp 140 or less. She has an appointment with her endocrinologist next week to evaluate for insulin changes. She has MFM u/s scheduled this aftenoon. She has not questions or concerns today. Discussed birth plan with pt. Encouraged her to bring in if she has written copy or to let [place] know of her birth plan request once she has decided. She verbalizes and agrees. Follow up 4 wks for ROB.   Doreene Burke, CNM

## 2023-02-25 ENCOUNTER — Ambulatory Visit
Admission: RE | Admit: 2023-02-25 | Discharge: 2023-02-25 | Disposition: A | Discharge status: Home / Self Care | Payer: Managed Care, Other (non HMO) | Source: Ambulatory Visit | Attending: Urgent Care | Admitting: Urgent Care

## 2023-02-25 VITALS — BP 111/69 | HR 108 | Temp 98.8°F | Resp 18

## 2023-02-25 DIAGNOSIS — L732 Hidradenitis suppurativa: Secondary | ICD-10-CM

## 2023-02-25 MED ORDER — CEPHALEXIN 500 MG PO CAPS: 500.0000 mg | ORAL_CAPSULE | Freq: Four times a day (QID) | ORAL | 0 refills | Status: AC

## 2023-02-25 NOTE — ED Triage Notes (Signed)
Patient to Urgent Care with complaints of abscess present to right sided lower abdomen. No drainage. Noticed the area on Thursday or Friday this week.   [redacted]w[redacted]d pregnant. Does not want the area lanced.   Taking tylenol.

## 2023-02-25 NOTE — ED Provider Notes (Signed)
[name]    CSN: [id number] Arrival date & time: 02/25/23  1606      History   Chief Complaint Chief Complaint  Patient presents with   Abscess    Entered by patient    HPI Jenell LLOYD BAKEY is a 32 y.o. female.    Abscess  Presents to urgent care with complaint of abscess present on right side groin.  She denies drainage.  Noticed the lesion 3 to 4 days ago.  Patient is [redacted] weeks pregnant and not interested in I&D.  PMH includes hidradenitis suppurativa with recurrent abscess, type 2 diabetes.  Past Medical History:  Diagnosis Date   Abscess of left breast    Abscess of left thigh    Abscess of right breast    Abscess of right thigh    Abscess of the breast and nipple 03/02/2019   Anxiety    Axillary abscess 07/02/2016   Biliary colic    Diabetes mellitus without complication (HCC)    GERD (gastroesophageal reflux disease)    Leukocytosis 07/19/2019   Migraines     Patient Active Problem List   Diagnosis Date Noted   Encounter for supervision of high risk young primigravida, antepartum 11/28/2022   Susceptible to varicella (non-immune), currently pregnant 11/28/2022   Hidradenitis suppurativa 04/29/2020   Annual physical exam 04/29/2020   Sinus tachycardia 04/29/2020   Hypertension associated with diabetes (HCC) 04/29/2020   Obesity (BMI 30.0-34.9) 04/29/2020   Dyspepsia    Abdominal pain 07/19/2019   Intractable nausea and vomiting 07/19/2019   HTN (hypertension) 07/19/2019   Attention deficit hyperactivity disorder (ADHD), 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   Depression with anxiety 05/18/2019   Type 2 diabetes mellitus with hyperglycemia, with long-term current use of insulin (HCC) 02/06/2019   Anxiety    Diabetes mellitus without complication (HCC)    GERD (gastroesophageal reflux disease)    Abscess of left axilla 07/02/2016    Past Surgical History:  Procedure Laterality Date   CHOLECYSTECTOMY     ESOPHAGOGASTRODUODENOSCOPY  (EGD) WITH PROPOFOL N/A 10/01/2019   Procedure: ESOPHAGOGASTRODUODENOSCOPY (EGD) WITH PROPOFOL;  Surgeon: Toney Reil, MD;  Location: [facility];  Service: Gastroenterology;  Laterality: N/A;   I & D EXTREMITY Left 03/05/2019   Procedure: Irrigation and debridement;  Surgeon: Henrene Dodge, MD;  Location: [facility];  Service: General;  Laterality: Left;   INCISION AND DRAINAGE     right breast x1 and left groin x1 and left axilla   INCISION AND DRAINAGE ABSCESS Left 07/02/2016   Procedure: INCISION AND DRAINAGE ABSCESS;  Surgeon: Lattie Haw, MD;  Location: [facility];  Service: General;  Laterality: Left;   INCISION AND DRAINAGE ABSCESS Left 04/18/2017   Procedure: INCISION AND DRAINAGE ABSCESS-LEFT GROIN;  Surgeon: Ancil Linsey, MD;  Location: [facility];  Service: General;  Laterality: Left;   INCISION AND DRAINAGE ABSCESS Right 05/21/2018   Procedure: INCISION AND DRAINAGE ABSCESS- RIGHT THIGH;  Surgeon: Henrene Dodge, MD;  Location: [facility];  Service: General;  Laterality: Right;   INCISION AND DRAINAGE ABSCESS Left 08/23/2020   Procedure: INCISION AND DRAINAGE ABSCESS, axillary;  Surgeon: Henrene Dodge, MD;  Location: [facility];  Service: General;  Laterality: Left;  provider requesting 2 hours /120 minutes for procedure   IRRIGATION AND DEBRIDEMENT ABSCESS Right 04/09/2018   Procedure: IRRIGATION AND DEBRIDEMENT ABSCESS;  Surgeon: Henrene Dodge, MD;  Location: [facility];  Service: General;  Laterality: Right;   IRRIGATION AND DEBRIDEMENT ABSCESS Left 05/21/2018   Procedure:  IRRIGATION AND DEBRIDEMENT BREAST ABSCESS;  Surgeon: Henrene Dodge, MD;  Location: [facility];  Service: General;  Laterality: Left;   IRRIGATION AND DEBRIDEMENT ABSCESS Right 01/06/2020   Procedure: MINOR INCISION AND DRAINAGE OF ABSCESS, right axillary;  Surgeon: Campbell Lerner, MD;  Location: [facility];  Service: General;  Laterality: Right;   PILONIDAL CYST EXCISION  12/03/2014   Procedure: CYST  EXCISION PILONIDAL EXTENSIVE;  Surgeon: Duwaine Maxin, MD;  Location: [facility];  Service: General;;   WISDOM TOOTH EXTRACTION     fourl; sometime 2017    OB History     Gravida  1   Para      Term      Preterm      AB      Living         SAB      IAB      Ectopic      Multiple      Live Births               Home Medications    Prior to Admission medications   Medication Sig Start Date End Date Taking? Authorizing Provider  aspirin EC 81 MG tablet Take 162 mg by mouth daily. Swallow whole.    [provider]  B-D ULTRAFINE III SHORT PEN 31G X 8 MM MISC  07/28/19   [provider]  Clindamycin Phosphate foam Apply to affected areas 1-2 times daily as needed for outbreaks. 10/04/20   Willeen Niece, MD  Insulin Degludec (TRESIBA) 100 UNIT/ML SOLN Inject 20 Units into the skin daily. Pt states she is taking up to  60units daily    [provider]  insulin lispro (HUMALOG) 100 UNIT/ML KwikPen Inject 100 Units into the skin 3 (three) times daily. Pt states she is taking 20 units 3 times a day and on sliding scale.    [provider]  metformin (FORTAMET) 500 MG (OSM) 24 hr tablet Take 500 mg by mouth 2 (two) times daily with a meal.    [provider]  omeprazole (PRILOSEC) 40 MG capsule Take 1 capsule (40 mg total) by mouth daily. 30 minutes before food 08/01/21   McLean-Scocuzza, Pasty Spillers, MD  Prenatal Vit-Fe Fumarate-FA (MULTIVITAMIN-PRENATAL) 27-0.8 MG TABS tablet Take 1 tablet by mouth daily at 12 noon.    [provider]  PRESCRIPTION MEDICATION 1 Device by Does not apply route as directed. Libra Monitor 08/20/22   [provider]    Family History Family History  Problem Relation Age of Onset   Healthy Mother    Depression Father    Hypertension Father    Diabetes Father    Heart disease Father    Sarcoidosis Father    Healthy Brother    Obesity Brother    Diabetes Maternal Grandmother     Diabetes Paternal Grandmother    Cancer Paternal Grandfather        colon and also siblings had cancer ?type   Hypertension Other     Social History Social History   Tobacco Use   Smoking status: Former    Types: Cigarettes   Smokeless tobacco: Never  Vaping Use   Vaping status: Never Used  Substance Use Topics   Alcohol use: Not Currently    Alcohol/week: 1.0 - 2.0 standard drink of alcohol    Types: 1 - 2 Glasses of wine per week    Comment: none last 24hrs   Drug use: No     Allergies  Sulfa antibiotics   Review of Systems Review of Systems   Physical Exam Triage Vital Signs ED Triage Vitals [02/25/23 1619]  Encounter Vitals Group     BP      Systolic BP Percentile      Diastolic BP Percentile      Pulse      Resp      Temp      Temp src      SpO2      Weight      Height      Head Circumference      Peak Flow      Pain Score 7     Pain Loc      Pain Education      Exclude from Growth Chart    No data found.  Updated Vital Signs LMP 09/01/2022 (Exact Date)   Visual Acuity Right Eye Distance:   Left Eye Distance:   Bilateral Distance:    Right Eye Near:   Left Eye Near:    Bilateral Near:     Physical Exam Vitals reviewed.  Constitutional:      Appearance: Normal appearance.  Skin:    General: Skin is warm and dry.     Findings: Lesion present.       Neurological:     General: No focal deficit present.     Mental Status: She is alert and oriented to person, place, and time.  Psychiatric:        Mood and Affect: Mood normal.        Behavior: Behavior normal.      UC Treatments / Results  Labs (all labs ordered are listed, but only abnormal results are displayed) Labs Reviewed - No data to display  EKG   Radiology No results found.  Procedures Procedures (including critical care time)  Medications Ordered in UC Medications - No data to display  Initial Impression / Assessment and Plan / UC Course  I have reviewed  the triage vital signs and the nursing notes.  Pertinent labs & imaging results that were available during my care of the patient were reviewed by me and considered in my medical decision making (see chart for details).   Presumed recurrence of hidradenitis suppurativa.  Recommended treatment is contraindicated given her pregnancy.  She was treated successfully, per chart, with cephalexin several months ago.  Will attempt to repeat this treatment and refer her to her dermatologist for additional evaluation.  Counseled patient on potential for adverse effects with medications prescribed/recommended today, ER and return-to-clinic precautions discussed, patient verbalized understanding and agreement with care plan.  Final Clinical Impressions(s) / UC Diagnoses   Final diagnoses:  None   Discharge Instructions   None    ED Prescriptions   None    PDMP not reviewed this encounter.   Charma Igo, [place] 02/25/23 [phone]

## 2023-02-25 NOTE — Discharge Instructions (Signed)
Recommend follow-up by dermatology

## 2023-03-06 NOTE — Telephone Encounter (Signed)
Morrie Sheldon RMA

## 2023-03-20 ENCOUNTER — Ambulatory Visit (INDEPENDENT_AMBULATORY_CARE_PROVIDER_SITE_OTHER): Payer: Managed Care, Other (non HMO) | Admitting: Obstetrics and Gynecology

## 2023-03-20 VITALS — BP 114/71 | HR 114 | Wt 196.6 lb

## 2023-03-20 DIAGNOSIS — O0992 Supervision of high risk pregnancy, unspecified, second trimester: Secondary | ICD-10-CM

## 2023-03-20 DIAGNOSIS — Z23 Encounter for immunization: Secondary | ICD-10-CM | POA: Diagnosis not present

## 2023-03-20 DIAGNOSIS — O0993 Supervision of high risk pregnancy, unspecified, third trimester: Secondary | ICD-10-CM

## 2023-03-20 DIAGNOSIS — Z131 Encounter for screening for diabetes mellitus: Secondary | ICD-10-CM

## 2023-03-20 DIAGNOSIS — Z3A28 28 weeks gestation of pregnancy: Secondary | ICD-10-CM

## 2023-03-20 DIAGNOSIS — Z113 Encounter for screening for infections with a predominantly sexual mode of transmission: Secondary | ICD-10-CM

## 2023-03-20 DIAGNOSIS — Z130 Encounter for screening for diseases of the blood and blood-forming organs and certain disorders involving the immune mechanism: Secondary | ICD-10-CM

## 2023-03-20 LAB — POCT URINALYSIS DIPSTICK OB
Bilirubin, UA: NEGATIVE
Blood, UA: NEGATIVE
Glucose, UA: NEGATIVE
Ketones, UA: NEGATIVE
Leukocytes, UA: NEGATIVE
Nitrite, UA: NEGATIVE
POC,PROTEIN,UA: NEGATIVE
Spec Grav, UA: 1.01 (ref 1.010–1.025)
Urobilinogen, UA: 0.2 E.U./dL
pH, UA: 6.5 (ref 5.0–8.0)

## 2023-03-20 NOTE — Progress Notes (Addendum)
ROB: Patient is a 32 y.o. G1P0 at [redacted]w[redacted]d who presents for routine OB care.  Pregnancy is complicated by GERD (gastroesophageal reflux disease); Type 2 diabetes mellitus with hyperglycemia, with long-term current use of insulin (HCC); Depression with anxiety; Attention deficit hyperactivity disorder (ADHD), predominantly inattentive type; HTN (hypertension); Dyspepsia; Hidradenitis suppurativa;  Sinus tachycardia; Hypertension associated with diabetes (HCC); Obesity (BMI 30.0-34.9); Encounter for supervision of high risk primigravida, antepartum; and Susceptible to varicella (non-immune), currently pregnant on their problem list.  Patient denies complaints. Was seen by her Endocrinologist several weeks ago, reports insulin was adjusted Evaristo Bury increased from 60 to 65 units).  Recent A1c was 6.6. Congratulated on good control.  For 28 week labs today.  Considering breastfeeding, desires unsure method for contraception.  Discussed birth plan. Handouts given on all topics. Desires circumcision for female infant. For Tdap today, signed blood consent.  Has next growth scan with MFM next week. RTC in 2 weeks.

## 2023-03-21 LAB — CBC
Hematocrit: 31.6 % — ABNORMAL LOW (ref 34.0–46.6)
Hemoglobin: 10.2 g/dL — ABNORMAL LOW (ref 11.1–15.9)
MCH: 26 pg — ABNORMAL LOW (ref 26.6–33.0)
MCHC: 32.3 g/dL (ref 31.5–35.7)
MCV: 81 fL (ref 79–97)
Platelets: 255 10*3/uL (ref 150–450)
RBC: 3.92 x10E6/uL (ref 3.77–5.28)
RDW: 13.9 % (ref 11.7–15.4)
WBC: 7.1 10*3/uL (ref 3.4–10.8)

## 2023-03-21 LAB — RPR: RPR Ser Ql: NONREACTIVE

## 2023-03-25 ENCOUNTER — Other Ambulatory Visit: Payer: Self-pay

## 2023-03-25 DIAGNOSIS — Z794 Long term (current) use of insulin: Secondary | ICD-10-CM

## 2023-03-25 DIAGNOSIS — K219 Gastro-esophageal reflux disease without esophagitis: Secondary | ICD-10-CM

## 2023-03-25 DIAGNOSIS — E669 Obesity, unspecified: Secondary | ICD-10-CM

## 2023-03-26 ENCOUNTER — Ambulatory Visit: Payer: Managed Care, Other (non HMO) | Admitting: Dermatology

## 2023-03-27 ENCOUNTER — Other Ambulatory Visit: Payer: Managed Care, Other (non HMO)

## 2023-03-27 ENCOUNTER — Other Ambulatory Visit: Payer: Self-pay

## 2023-03-27 ENCOUNTER — Ambulatory Visit: Payer: Managed Care, Other (non HMO) | Attending: Obstetrics

## 2023-03-27 ENCOUNTER — Ambulatory Visit: Payer: Managed Care, Other (non HMO)

## 2023-03-27 DIAGNOSIS — E669 Obesity, unspecified: Secondary | ICD-10-CM | POA: Insufficient documentation

## 2023-03-27 DIAGNOSIS — Z794 Long term (current) use of insulin: Secondary | ICD-10-CM | POA: Diagnosis not present

## 2023-03-27 DIAGNOSIS — O24113 Pre-existing diabetes mellitus, type 2, in pregnancy, third trimester: Secondary | ICD-10-CM | POA: Insufficient documentation

## 2023-03-27 DIAGNOSIS — E1165 Type 2 diabetes mellitus with hyperglycemia: Secondary | ICD-10-CM | POA: Insufficient documentation

## 2023-03-27 DIAGNOSIS — O3663X0 Maternal care for excessive fetal growth, third trimester, not applicable or unspecified: Secondary | ICD-10-CM | POA: Insufficient documentation

## 2023-03-27 DIAGNOSIS — O99213 Obesity complicating pregnancy, third trimester: Secondary | ICD-10-CM | POA: Diagnosis not present

## 2023-03-27 DIAGNOSIS — O99613 Diseases of the digestive system complicating pregnancy, third trimester: Secondary | ICD-10-CM | POA: Diagnosis not present

## 2023-03-27 DIAGNOSIS — Z3A29 29 weeks gestation of pregnancy: Secondary | ICD-10-CM | POA: Diagnosis not present

## 2023-03-27 DIAGNOSIS — K219 Gastro-esophageal reflux disease without esophagitis: Secondary | ICD-10-CM | POA: Insufficient documentation

## 2023-04-02 ENCOUNTER — Encounter: Payer: Self-pay | Admitting: Certified Nurse Midwife

## 2023-04-04 ENCOUNTER — Ambulatory Visit: Payer: Managed Care, Other (non HMO) | Admitting: Certified Nurse Midwife

## 2023-04-04 ENCOUNTER — Encounter: Payer: Self-pay | Admitting: Certified Nurse Midwife

## 2023-04-04 ENCOUNTER — Ambulatory Visit
Admission: RE | Admit: 2023-04-04 | Discharge: 2023-04-04 | Disposition: A | Discharge status: Home / Self Care | Payer: Managed Care, Other (non HMO) | Source: Ambulatory Visit | Attending: Emergency Medicine | Admitting: Emergency Medicine

## 2023-04-04 VITALS — BP 117/73 | HR 121 | Wt 195.6 lb

## 2023-04-04 VITALS — BP 131/78 | HR 121 | Temp 98.0°F | Resp 18

## 2023-04-04 DIAGNOSIS — L02411 Cutaneous abscess of right axilla: Secondary | ICD-10-CM

## 2023-04-04 DIAGNOSIS — Z3A30 30 weeks gestation of pregnancy: Secondary | ICD-10-CM

## 2023-04-04 LAB — POCT URINALYSIS DIPSTICK OB
Bilirubin, UA: NEGATIVE
Blood, UA: NEGATIVE
Leukocytes, UA: NEGATIVE
Nitrite, UA: NEGATIVE
Spec Grav, UA: 1.015 (ref 1.010–1.025)
Urobilinogen, UA: 0.2 U/dL
pH, UA: 6.5 (ref 5.0–8.0)

## 2023-04-04 MED ORDER — CEPHALEXIN 500 MG PO CAPS: 500.0000 mg | ORAL_CAPSULE | Freq: Three times a day (TID) | ORAL | 0 refills | Status: AC

## 2023-04-04 NOTE — ED Provider Notes (Signed)
Dawn Ortiz    CSN: [id number] Arrival date & time: 04/04/23  0946      History   Chief Complaint Chief Complaint  Patient presents with   Abscess    right armpit - Entered by patient    HPI Dawn Ortiz is a 32 y.o. female.   Patient presents for evaluation of a abscess to the right axilla present for 7 days.  Has become somewhat smaller today.  Possible drainage 1 day ago but not entirely sure.  Denies presence of fever.  Been using warm compresses and Tylenol for management.  History of hidradenitis supportive.   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   Depression with anxiety 05/18/2019   Type 2 diabetes mellitus with hyperglycemia, with long-term current use of insulin (HCC) 02/06/2019   Anxiety    GERD (gastroesophageal reflux disease)    Abscess of left axilla 07/02/2016    Past Surgical History:  Procedure Laterality Date   CHOLECYSTECTOMY     ESOPHAGOGASTRODUODENOSCOPY (EGD) WITH PROPOFOL N/A 10/01/2019   Procedure: ESOPHAGOGASTRODUODENOSCOPY (EGD) WITH PROPOFOL;  Surgeon: Toney Reil, MD;  Location: [facility];  Service: Gastroenterology;  Laterality: N/A;   I & D EXTREMITY Left 03/05/2019   Procedure: Irrigation and debridement;  Surgeon: Henrene Dodge, MD;  Location: [facility];  Service: General;  Laterality: Left;   INCISION AND DRAINAGE     right breast x1 and left groin x1 and left axilla   INCISION AND DRAINAGE ABSCESS Left 07/02/2016   Procedure: INCISION AND DRAINAGE ABSCESS;  Surgeon: Lattie Haw, MD;  Location: [facility];   Service: General;  Laterality: Left;   INCISION AND DRAINAGE ABSCESS Left 04/18/2017   Procedure: INCISION AND DRAINAGE ABSCESS-LEFT GROIN;  Surgeon: Ancil Linsey, MD;  Location: [facility];  Service: General;  Laterality: Left;   INCISION AND DRAINAGE ABSCESS Right 05/21/2018   Procedure: INCISION AND DRAINAGE ABSCESS- RIGHT THIGH;  Surgeon: Henrene Dodge, MD;  Location: [facility];  Service: General;  Laterality: Right;   INCISION AND DRAINAGE ABSCESS Left 08/23/2020   Procedure: INCISION AND DRAINAGE ABSCESS, axillary;  Surgeon: Henrene Dodge, MD;  Location: [facility];  Service: General;  Laterality: Left;  provider requesting 2 hours /120 minutes for procedure   IRRIGATION AND DEBRIDEMENT ABSCESS Right 04/09/2018   Procedure: IRRIGATION AND DEBRIDEMENT ABSCESS;  Surgeon: Henrene Dodge, MD;  Location: [facility];  Service: General;  Laterality: Right;   IRRIGATION AND DEBRIDEMENT ABSCESS Left 05/21/2018   Procedure: IRRIGATION AND DEBRIDEMENT BREAST ABSCESS;  Surgeon: Henrene Dodge, MD;  Location: [facility];  Service: General;  Laterality: Left;   IRRIGATION AND DEBRIDEMENT ABSCESS Right 01/06/2020   Procedure: MINOR INCISION AND DRAINAGE OF ABSCESS, right axillary;  Surgeon: Campbell Lerner, MD;  Location: [facility];  Service: General;  Laterality: Right;   PILONIDAL CYST EXCISION  12/03/2014   Procedure: CYST EXCISION PILONIDAL EXTENSIVE;  Surgeon: Duwaine Maxin, MD;  Location: [facility];  Service: General;;   WISDOM TOOTH EXTRACTION     fourl; sometime 2017    OB History     Gravida  1   Para      Term  Preterm      AB      Living         SAB      IAB      Ectopic      Multiple      Live Births               Home Medications    Prior to Admission medications   Medication Sig Start Date End Date Taking? Authorizing Provider  aspirin EC 81 MG tablet Take 162 mg by mouth daily. Swallow whole.    [provider]  B-D ULTRAFINE III SHORT PEN 31G X  8 MM MISC  07/28/19   [provider]  BD PEN NEEDLE MICRO U/F 32G X 6 MM MISC Inject into the skin.    [provider]  Clindamycin Phosphate foam Apply to affected areas 1-2 times daily as needed for outbreaks. 10/04/20   Willeen Niece, MD  Continuous Glucose Sensor (FREESTYLE LIBRE 3 SENSOR) MISC  02/18/23   [provider]  insulin lispro (HUMALOG) 100 UNIT/ML KwikPen Inject 100 Units into the skin 3 (three) times daily. Pt states she is taking 20 units 3 times a day and on sliding scale.    [provider]  metFORMIN (GLUCOPHAGE-XR) 500 MG 24 hr tablet Take 500 mg by mouth 2 (two) times daily. 02/04/23   [provider]  omeprazole (PRILOSEC) 40 MG capsule Take 1 capsule (40 mg total) by mouth daily. 30 minutes before food 08/01/21   McLean-Scocuzza, Pasty Spillers, MD  Prenatal Vit-Fe Fumarate-FA (MULTIVITAMIN-PRENATAL) 27-0.8 MG TABS tablet Take 1 tablet by mouth daily at 12 noon.    [provider]  PRESCRIPTION MEDICATION 1 Device by Does not apply route as directed. Libra Monitor 08/20/22   [provider]  TRESIBA FLEXTOUCH 100 UNIT/ML FlexTouch Pen Inject 65 Units into the skin daily. 02/04/23   [provider]    Family History Family History  Problem Relation Age of Onset   Healthy Mother    Depression Father    Hypertension Father    Diabetes Father    Heart disease Father    Sarcoidosis Father    Healthy Brother    Obesity Brother    Diabetes Maternal Grandmother    Diabetes Paternal Grandmother    Cancer Paternal Grandfather        colon and also siblings had cancer ?type   Hypertension Other     Social History Social History   Tobacco Use   Smoking status: Former    Types: Cigarettes   Smokeless tobacco: Never  Vaping Use   Vaping status: Never Used  Substance Use Topics   Alcohol use: Not Currently    Alcohol/week: 1.0 - 2.0 standard drink of alcohol    Types: 1 - 2 Glasses of wine per week     Comment: none last 24hrs   Drug use: No     Allergies   Sulfa antibiotics   Review of Systems Review of Systems   Physical Exam Triage Vital Signs ED Triage Vitals  Encounter Vitals Group     BP 04/04/23 0954 131/78     Systolic BP Percentile --      Diastolic BP Percentile --      Pulse Rate 04/04/23 0954 (!) 121     Resp 04/04/23 0954 18     Temp 04/04/23 0954 98 F (36.7 C)     Temp Source 04/04/23 0954 Temporal     SpO2  04/04/23 0954 98 %     Weight --      Height --      Head Circumference --      Peak Flow --      Pain Score 04/04/23 0955 4     Pain Loc --      Pain Education --      Exclude from Growth Chart --    No data found.  Updated Vital Signs BP 131/78 (BP Location: Left Arm)   Pulse (!) 121   Temp 98 F (36.7 C) (Temporal)   Resp 18   LMP 09/01/2022 (Exact Date)   SpO2 98%   Visual Acuity Right Eye Distance:   Left Eye Distance:   Bilateral Distance:    Right Eye Near:   Left Eye Near:    Bilateral Near:     Physical Exam Constitutional:      Appearance: Normal appearance.  Eyes:     Extraocular Movements: Extraocular movements intact.  Pulmonary:     Effort: Pulmonary effort is normal.  Skin:    Comments: 2 x 3 abscess present to the right axilla at approximately 6:00  Neurological:     Mental Status: She is alert and oriented to person, place, and time. Mental status is at baseline.      UC Treatments / Results  Labs (all labs ordered are listed, but only abnormal results are displayed) Labs Reviewed - No data to display  EKG   Radiology No results found.  Procedures Incision and Drainage  Date/Time: 04/04/2023 11:24 AM  Performed by: Valinda Hoar, NP Authorized by: Valinda Hoar, NP   Consent:    Consent obtained:  Verbal   Consent given by:  Patient   Risks, benefits, and alternatives were discussed: yes     Risks discussed:  Incomplete drainage Universal protocol:    Procedure explained and  questions answered to patient or proxy's satisfaction: yes     Patient identity confirmed:  Verbally with patient Location:    Type:  Abscess   Size:  2x3   Location: right axilla. Pre-procedure details:    Skin preparation:  Chlorhexidine Anesthesia:    Anesthesia method:  Local infiltration   Local anesthetic:  Lidocaine 1% WITH epi Procedure type:    Complexity:  Simple Procedure details:    Ultrasound guidance: no     Needle aspiration: no     Incision types:  Single straight   Incision depth:  Dermal   Drainage:  Purulent   Drainage amount:  Copious   Wound treatment:  Wound left open   Packing materials:  None Post-procedure details:    Procedure completion:  Tolerated  (including critical care time)  Medications Ordered in UC Medications - No data to display  Initial Impression / Assessment and Plan / UC Course  I have reviewed the triage vital signs and the nursing notes.  Pertinent labs & imaging results that were available during my care of the patient were reviewed by me and considered in my medical decision making (see chart for details).  Abscess of the right axilla  I&D completed, copious purulent drainage expelled, placed on cephalexin, currently [redacted] weeks pregnant, recommended Tylenol and warm compresses outpatient and advised to follow-up for nonhealing site Final Clinical Impressions(s) / UC Diagnoses   Final diagnoses:  None   Discharge Instructions   None    ED Prescriptions   None    PDMP not reviewed this encounter.   Salli Quarry  R, NP 04/04/23 1125

## 2023-04-04 NOTE — Discharge Instructions (Signed)
Abscess has been drained here in clinic  Take every 8 hours for 5 days to clear to remaining to your symptoms  Use Tylenol every 6 hours as needed for discomfort  Hold warm-hot compresses to affected area at least 4 times a day, this helps to facilitate draining, the more the better  Please return for evaluation for increased swelling, increased tenderness or pain, non healing site, non draining site, you begin to have fever or chills

## 2023-04-04 NOTE — ED Triage Notes (Signed)
Patient presents to UC for right axilla abscess x 1 week. Hx if hidradenitis suppurativa. Denies drainage. Has treated with warm compresses and tylenol.

## 2023-04-04 NOTE — Patient Instructions (Signed)
 Preterm Labor Pregnancy normally lasts 39-41 weeks. Preterm labor is when labor starts before you have been pregnant for 37 weeks. Babies who are born too early may have a higher risk for long-term problems like cerebral palsy or developmental delays. They may also have problems soon after birth, such as problems with blood sugar, body temperature, heart, and breathing. These problems may be very serious in babies who are born before 34 weeks of pregnancy. What are the causes? The cause of this condition is not known. What increases the risk? You are more likely to have preterm labor if: You have medical problems, now or in the past. You have problems now or in your past pregnancies. You have lifestyle problems. Medical history You have problems of the womb (uterus). You have an infection, including infections you get from sex. You have problems that do not go away, such as: Blood clots. High blood pressure. High blood sugar. You have low body weight or too much body weight. Present and past pregnancies You have had preterm labor before. You are pregnant with two babies or more. You have a condition in which the placenta covers your cervix. You waited less than 18 months between giving birth and becoming pregnant again. Your unborn baby has some problems. You have bleeding from your vagina. You became pregnant by a method called IVF. Lifestyle You smoke. You drink alcohol. You use drugs. You have stress. You have abuse in your home. You come in contact with chemicals that harm the body (pollutants). Other factors You are younger than 17 years or older than 35 years. What are the signs or symptoms? Symptoms of this condition include: Cramps. The cramps may feel like cramps from a period. You may also have watery poop (diarrhea). Pain in the belly (abdomen). Pain in the lower back. Regular contractions. It may feel like your belly is getting tighter. Pressure in the lower  belly. More fluid leaking from the vagina. The fluid may be watery or bloody. Water breaking. How is this treated? Treatment for this condition depends on your health, the health of your baby, and how old your pregnancy is. It may include: Taking medicines, such as: Hormone medicines. Medicines to stop contractions. Medicines to help mature the baby's lungs. Medicines to prevent your baby from getting cerebral palsy or other problems. Bed rest. If the labor happens before 34 weeks of pregnancy, you may need to stay in the hospital. Delivering the baby. Follow these instructions at home:  Do not smoke or use any products that contain nicotine or tobacco. If you need help quitting, ask your doctor. Do not drink alcohol. Take over-the-counter and prescription medicines only as told by your doctor. Rest as told by your doctor. Return to your normal activities when your doctor says that it is safe. Keep all follow-up visits. How is this prevented? To have a healthy pregnancy: Do not use drugs. Do not use any medicines unless you ask your doctor if they are safe for you. Talk with your doctor before taking any herbal supplements. Make sure you gain enough weight. Watch for infection. If you think you might have an infection, get it checked right away. Symptoms of infection may include: Fever. Vaginal discharge that smells bad or is not normal. Pain or burning when you pee. Needing to pee urgently. Needing to pee often. Peeing small amounts often. Blood in your pee. Pee that smells bad or unusual. Where to find more information U.S. Department of Health and [employer]  Office on [employer] Health: http://hoffman.com/ [employer]stetricians and [employer]necologists: www.acog.org Centers for Disease Control and Prevention: FootballExhibition.com.br Contact a doctor if: You think you are going into preterm labor. You have symptoms of preterm labor. You have symptoms of infection. Get help  right away if: You are having painful contractions every 5 minutes or less. Your water breaks. Summary Preterm labor is labor that starts before you reach 37 weeks of pregnancy. Your baby may have problems if delivered early. You are more likely to have preterm labor if you have certain medical problems or problems with a pregnancy now or in the past. Some lifestyle factors can also increase the risk. Contact a doctor if you have symptoms of preterm labor. This information is not intended to replace advice given to you by your health care provider. Make sure you discuss any questions you have with your health care provider. Document Revised: 07/15/2020 Document Reviewed: 07/19/2020 Elsevier Patient Education  2024 [employer].

## 2023-04-04 NOTE — Progress Notes (Signed)
ROB doing well, feeling good movement. BS fasting's staying 100-120 range. 2hr pp 140s. She had a visit with her endocrinologist 2 weeks agon her Hema1c 6.6.   She has MFM BPP scheduled 8/25 for BPP.   Requesting note for jury duty, note given. Follow up 2 wks or prn .   Doreene Burke, CNM

## 2023-04-05 ENCOUNTER — Ambulatory Visit: Payer: Managed Care, Other (non HMO) | Admitting: Surgery

## 2023-04-10 ENCOUNTER — Encounter: Payer: Managed Care, Other (non HMO) | Admitting: Certified Nurse Midwife

## 2023-04-17 ENCOUNTER — Encounter: Payer: Self-pay | Admitting: Certified Nurse Midwife

## 2023-04-17 ENCOUNTER — Ambulatory Visit (INDEPENDENT_AMBULATORY_CARE_PROVIDER_SITE_OTHER): Payer: Managed Care, Other (non HMO) | Admitting: Certified Nurse Midwife

## 2023-04-17 VITALS — BP 120/84 | HR 108 | Wt 198.8 lb

## 2023-04-17 DIAGNOSIS — Z3A32 32 weeks gestation of pregnancy: Secondary | ICD-10-CM

## 2023-04-17 DIAGNOSIS — O0992 Supervision of high risk pregnancy, unspecified, second trimester: Secondary | ICD-10-CM

## 2023-04-17 LAB — POCT URINALYSIS DIPSTICK OB
Bilirubin, UA: NEGATIVE
Blood, UA: NEGATIVE
Glucose, UA: NEGATIVE
Ketones, UA: NEGATIVE
Leukocytes, UA: NEGATIVE
Nitrite, UA: NEGATIVE
Spec Grav, UA: 1.015 (ref 1.010–1.025)
Urobilinogen, UA: 0.2 U/dL
pH, UA: 6 (ref 5.0–8.0)

## 2023-04-17 NOTE — Patient Instructions (Signed)

## 2023-04-17 NOTE — Progress Notes (Signed)
ROB doing well, feeling good movement. Discussed NST weekly , she is in agreement. Will start next visit. She notes that she had some BS elevations, she informed her indocrinologist and she has had some adjustment in her medications and now her fasting are 100-120, 2 hr pp below 140. She has MFM growth & BPP scheduled. She thinks it maybe next week or the week after. Encouraged pt to keep as scheduled.   Follow up 1 wk for NST and ROB.   Doreene Burke, CNM

## 2023-04-19 ENCOUNTER — Other Ambulatory Visit: Payer: Self-pay | Admitting: Maternal & Fetal Medicine

## 2023-04-19 DIAGNOSIS — O24913 Unspecified diabetes mellitus in pregnancy, third trimester: Secondary | ICD-10-CM

## 2023-04-24 ENCOUNTER — Ambulatory Visit: Payer: Managed Care, Other (non HMO) | Attending: Obstetrics

## 2023-04-24 ENCOUNTER — Other Ambulatory Visit: Payer: Self-pay

## 2023-04-24 ENCOUNTER — Other Ambulatory Visit: Payer: Managed Care, Other (non HMO)

## 2023-04-24 ENCOUNTER — Telehealth: Payer: Self-pay

## 2023-04-24 ENCOUNTER — Encounter: Payer: Managed Care, Other (non HMO) | Admitting: Certified Nurse Midwife

## 2023-04-24 ENCOUNTER — Other Ambulatory Visit: Payer: Self-pay | Admitting: Obstetrics

## 2023-04-24 DIAGNOSIS — O99213 Obesity complicating pregnancy, third trimester: Secondary | ICD-10-CM | POA: Insufficient documentation

## 2023-04-24 DIAGNOSIS — Z3A33 33 weeks gestation of pregnancy: Secondary | ICD-10-CM | POA: Diagnosis not present

## 2023-04-24 DIAGNOSIS — E1169 Type 2 diabetes mellitus with other specified complication: Secondary | ICD-10-CM | POA: Diagnosis not present

## 2023-04-24 DIAGNOSIS — Z0289 Encounter for other administrative examinations: Secondary | ICD-10-CM

## 2023-04-24 DIAGNOSIS — Z794 Long term (current) use of insulin: Secondary | ICD-10-CM | POA: Diagnosis not present

## 2023-04-24 DIAGNOSIS — E669 Obesity, unspecified: Secondary | ICD-10-CM

## 2023-04-24 DIAGNOSIS — O24913 Unspecified diabetes mellitus in pregnancy, third trimester: Secondary | ICD-10-CM

## 2023-04-24 DIAGNOSIS — O24113 Pre-existing diabetes mellitus, type 2, in pregnancy, third trimester: Secondary | ICD-10-CM | POA: Insufficient documentation

## 2023-04-24 NOTE — Telephone Encounter (Signed)
Left message for patient to call office to reschedule BPP at [facility] to [place] location due to [facility] office closed on 10/23  Sending MyChart message as well

## 2023-04-26 ENCOUNTER — Telehealth: Payer: Self-pay

## 2023-04-26 NOTE — Telephone Encounter (Signed)
Called Dawn Ortiz and left voicemail. I have a question about her FMLA

## 2023-04-29 ENCOUNTER — Other Ambulatory Visit: Payer: Self-pay | Admitting: Obstetrics

## 2023-04-29 DIAGNOSIS — O24913 Unspecified diabetes mellitus in pregnancy, third trimester: Secondary | ICD-10-CM

## 2023-05-01 ENCOUNTER — Encounter: Payer: Self-pay | Admitting: Certified Nurse Midwife

## 2023-05-01 ENCOUNTER — Ambulatory Visit (INDEPENDENT_AMBULATORY_CARE_PROVIDER_SITE_OTHER): Payer: Managed Care, Other (non HMO) | Admitting: Certified Nurse Midwife

## 2023-05-01 ENCOUNTER — Telehealth: Payer: Self-pay | Admitting: Certified Nurse Midwife

## 2023-05-01 ENCOUNTER — Ambulatory Visit: Payer: Managed Care, Other (non HMO) | Attending: Maternal & Fetal Medicine

## 2023-05-01 ENCOUNTER — Other Ambulatory Visit: Payer: Self-pay

## 2023-05-01 VITALS — BP 122/87 | HR 105 | Wt 202.4 lb

## 2023-05-01 DIAGNOSIS — Z362 Encounter for other antenatal screening follow-up: Secondary | ICD-10-CM | POA: Insufficient documentation

## 2023-05-01 DIAGNOSIS — O403XX0 Polyhydramnios, third trimester, not applicable or unspecified: Secondary | ICD-10-CM | POA: Insufficient documentation

## 2023-05-01 DIAGNOSIS — Z3A34 34 weeks gestation of pregnancy: Secondary | ICD-10-CM | POA: Insufficient documentation

## 2023-05-01 DIAGNOSIS — E119 Type 2 diabetes mellitus without complications: Secondary | ICD-10-CM | POA: Diagnosis not present

## 2023-05-01 DIAGNOSIS — O99213 Obesity complicating pregnancy, third trimester: Secondary | ICD-10-CM | POA: Insufficient documentation

## 2023-05-01 DIAGNOSIS — O24113 Pre-existing diabetes mellitus, type 2, in pregnancy, third trimester: Secondary | ICD-10-CM | POA: Insufficient documentation

## 2023-05-01 DIAGNOSIS — Z794 Long term (current) use of insulin: Secondary | ICD-10-CM | POA: Diagnosis not present

## 2023-05-01 DIAGNOSIS — E669 Obesity, unspecified: Secondary | ICD-10-CM

## 2023-05-01 DIAGNOSIS — O24913 Unspecified diabetes mellitus in pregnancy, third trimester: Secondary | ICD-10-CM | POA: Diagnosis present

## 2023-05-01 LAB — POCT URINALYSIS DIPSTICK OB
Bilirubin, UA: NEGATIVE
Blood, UA: NEGATIVE
Glucose, UA: NEGATIVE
Ketones, UA: NEGATIVE
Leukocytes, UA: NEGATIVE
Nitrite, UA: NEGATIVE
POC,PROTEIN,UA: NEGATIVE
Spec Grav, UA: 1.015 (ref 1.010–1.025)
Urobilinogen, UA: 0.2 U/dL
pH, UA: 6 (ref 5.0–8.0)

## 2023-05-01 NOTE — Progress Notes (Signed)
ROB doing well. Seen today with MFM for BPP . See noted 8/8 results. U/s shows polyhydramnios AFI 24.54. BS reviewed. PT states been harder to control sugars in the 3rd trimester. Her fasting's have been 100-150, 3hrs pp 130-160's She state she is in contact with her endocrinologist and they have been "playing with the dosage" .    Reviewed blood sugars with Dr. Valentino Saxon. Per DR. Cherry Based on those values I believe she still needs to be taking the 20 units and doing the sliding scale. If he just recently adjusted her Evaristo Bury in the last few days, advise to wait a week before going back up on her meal dosing. If it's already been over a week, then she can go back up to the 20 units (increasing 2 units every 2-3 days until she reaches 20 units to avoid hypoglycemia)   Follow up Friday for NST and as scheduled 1 week with Dr. Eula Listen, CNM

## 2023-05-01 NOTE — Patient Instructions (Signed)
 Preterm Labor Pregnancy normally lasts 39-41 weeks. Preterm labor is when labor starts before you have been pregnant for 37 weeks. Babies who are born too early may have a higher risk for long-term problems like cerebral palsy or developmental delays. They may also have problems soon after birth, such as problems with blood sugar, body temperature, heart, and breathing. These problems may be very serious in babies who are born before 34 weeks of pregnancy. What are the causes? The cause of this condition is not known. What increases the risk? You are more likely to have preterm labor if: You have medical problems, now or in the past. You have problems now or in your past pregnancies. You have lifestyle problems. Medical history You have problems of the womb (uterus). You have an infection, including infections you get from sex. You have problems that do not go away, such as: Blood clots. High blood pressure. High blood sugar. You have low body weight or too much body weight. Present and past pregnancies You have had preterm labor before. You are pregnant with two babies or more. You have a condition in which the placenta covers your cervix. You waited less than 18 months between giving birth and becoming pregnant again. Your unborn baby has some problems. You have bleeding from your vagina. You became pregnant by a method called IVF. Lifestyle You smoke. You drink alcohol. You use drugs. You have stress. You have abuse in your home. You come in contact with chemicals that harm the body (pollutants). Other factors You are younger than 17 years or older than 35 years. What are the signs or symptoms? Symptoms of this condition include: Cramps. The cramps may feel like cramps from a period. You may also have watery poop (diarrhea). Pain in the belly (abdomen). Pain in the lower back. Regular contractions. It may feel like your belly is getting tighter. Pressure in the lower  belly. More fluid leaking from the vagina. The fluid may be watery or bloody. Water breaking. How is this treated? Treatment for this condition depends on your health, the health of your baby, and how old your pregnancy is. It may include: Taking medicines, such as: Hormone medicines. Medicines to stop contractions. Medicines to help mature the baby's lungs. Medicines to prevent your baby from getting cerebral palsy or other problems. Bed rest. If the labor happens before 34 weeks of pregnancy, you may need to stay in the hospital. Delivering the baby. Follow these instructions at home:  Do not smoke or use any products that contain nicotine or tobacco. If you need help quitting, ask your doctor. Do not drink alcohol. Take over-the-counter and prescription medicines only as told by your doctor. Rest as told by your doctor. Return to your normal activities when your doctor says that it is safe. Keep all follow-up visits. How is this prevented? To have a healthy pregnancy: Do not use drugs. Do not use any medicines unless you ask your doctor if they are safe for you. Talk with your doctor before taking any herbal supplements. Make sure you gain enough weight. Watch for infection. If you think you might have an infection, get it checked right away. Symptoms of infection may include: Fever. Vaginal discharge that smells bad or is not normal. Pain or burning when you pee. Needing to pee urgently. Needing to pee often. Peeing small amounts often. Blood in your pee. Pee that smells bad or unusual. Where to find more information U.S. Department of Health and [employer]  Office on [employer] Health: http://hoffman.com/ [employer]stetricians and [employer]necologists: www.acog.org Centers for Disease Control and Prevention: FootballExhibition.com.br Contact a doctor if: You think you are going into preterm labor. You have symptoms of preterm labor. You have symptoms of infection. Get help  right away if: You are having painful contractions every 5 minutes or less. Your water breaks. Summary Preterm labor is labor that starts before you reach 37 weeks of pregnancy. Your baby may have problems if delivered early. You are more likely to have preterm labor if you have certain medical problems or problems with a pregnancy now or in the past. Some lifestyle factors can also increase the risk. Contact a doctor if you have symptoms of preterm labor. This information is not intended to replace advice given to you by your health care provider. Make sure you discuss any questions you have with your health care provider. Document Revised: 07/15/2020 Document Reviewed: 07/19/2020 Elsevier Patient Education  2024 [employer].

## 2023-05-01 NOTE — Addendum Note (Signed)
Addended by: Fonda Kinder on: 05/01/2023 02:01 PM   Modules accepted: Orders

## 2023-05-01 NOTE — Telephone Encounter (Signed)
I contacted the patient via phone x2. Per Pattricia Boss the patient need weekly NST's  scheduled on Friday's following MFM ultrasound on Wednesday's.I left message advising the patient to contact our office to confirm these scheduled appointments

## 2023-05-02 NOTE — Telephone Encounter (Signed)
The patient contacted the office an is aware of Friday appointments

## 2023-05-02 NOTE — Telephone Encounter (Signed)
I contacted the patient via phone. I left detailed message asking the patient to contact our office to confirm her nst scheduled weekly.

## 2023-05-03 ENCOUNTER — Ambulatory Visit: Payer: Managed Care, Other (non HMO)

## 2023-05-03 VITALS — BP 134/83 | HR 102 | Ht 62.0 in | Wt 203.0 lb

## 2023-05-03 DIAGNOSIS — Z3A34 34 weeks gestation of pregnancy: Secondary | ICD-10-CM

## 2023-05-03 DIAGNOSIS — O403XX0 Polyhydramnios, third trimester, not applicable or unspecified: Secondary | ICD-10-CM

## 2023-05-03 DIAGNOSIS — O24113 Pre-existing diabetes mellitus, type 2, in pregnancy, third trimester: Secondary | ICD-10-CM

## 2023-05-03 DIAGNOSIS — O24415 Gestational diabetes mellitus in pregnancy, controlled by oral hypoglycemic drugs: Secondary | ICD-10-CM

## 2023-05-03 HISTORY — DX: Polyhydramnios, third trimester, not applicable or unspecified: O40.3XX0

## 2023-05-03 HISTORY — DX: Pre-existing type 2 diabetes mellitus, in pregnancy, third trimester: O24.113

## 2023-05-03 NOTE — Patient Instructions (Signed)

## 2023-05-03 NOTE — Progress Notes (Signed)
NURSE VISIT NOTE  Subjective:    Patient ID: Dawn Ortiz, female    DOB: 1991/05/18, 32 y.o.   MRN: [id number]  HPI  Patient is a 32 y.o. G1P0 female who presents for fetal monitoring per order from Doreene Burke, CNM.   Objective:    BP 134/83   Pulse (!) 102   Ht 5\' 2"  (1.575 m)   Wt 203 lb (92.1 kg)   LMP 09/01/2022 (Exact Date)   BMI 37.13 kg/m  Estimated Date of Delivery: 06/08/23  [redacted]w[redacted]d  Fetus A Non-Stress Test Interpretation for 05/03/23  Indication: Gestational Diabetes medication controlled and Polyhydramnios  Fetal Heart Rate A Mode: External Baseline Rate (A): 140 bpm Variability: Moderate Accelerations: 15 x 15 Decelerations: None Scalp Stimulation: Positive  Uterine Activity Mode: Toco Contraction Frequency (min): 3 Contraction Duration (sec): 50-60 Contraction Quality: Mild Resting Time: Adequate  Interpretation (Fetal Testing) Nonstress Test Interpretation: Reactive Overall Impression: Reassuring for gestational age   Assessment:   1. Pre-existing type 2 diabetes mellitus during pregnancy in third trimester   2. Polyhydramnios in third trimester complication, single or unspecified fetus   3. [redacted] weeks gestation of pregnancy      Plan:   Results reviewed and discussed with patient by  Doreene Burke, CNM.     Rocco Serene, LPN

## 2023-05-06 ENCOUNTER — Encounter: Payer: Self-pay | Admitting: Certified Nurse Midwife

## 2023-05-07 ENCOUNTER — Ambulatory Visit: Admit: 2023-05-07 | Discharge: 2023-05-07 | Attending: Emergency Medicine | Primary: Family Medicine

## 2023-05-07 DIAGNOSIS — J029 Acute pharyngitis, unspecified: Secondary | ICD-10-CM

## 2023-05-07 LAB — AMB POC RAPID STREP TEST: Group A Strep Antigen, POC: NEGATIVE

## 2023-05-07 LAB — POC COVID-19 (LIAT IN HOUSE): SARS-CoV-2: NOT DETECTED

## 2023-05-07 LAB — POCT INFLUENZA A/B,AG,EIA
Influenza A Antigen, POC: NEGATIVE
Influenza B Antigen, POC: NEGATIVE

## 2023-05-07 MED ORDER — AZITHROMYCIN 250 MG PO TABS
250 | ORAL_TABLET | ORAL | 0 refills | Status: AC
Start: 2023-05-07 — End: 2023-05-17

## 2023-05-07 MED ORDER — BENZONATATE 200 MG PO CAPS
200 | ORAL_CAPSULE | Freq: Three times a day (TID) | ORAL | 0 refills | Status: AC | PRN
Start: 2023-05-07 — End: 2023-05-14

## 2023-05-07 MED ORDER — FEXOFENADINE-PSEUDOEPHED ER 60-120 MG PO TB12
60-120 | ORAL_TABLET | Freq: Two times a day (BID) | ORAL | 0 refills | Status: AC
Start: 2023-05-07 — End: 2023-05-14

## 2023-05-07 NOTE — Progress Notes (Signed)
Heidi Combs (DOB:  [birth year]) is a 32 y.o. female,Established patient, here for evaluation of the following chief complaint(s):  Generalized Body Aches (05/04/23 started with body aches , chills , and sore throat and congestion )      Assessment & Plan :  Visit Diagnoses and Associated Orders       Sore throat    -  Primary    POC COVID-19 ([facility] [place]) [13086 CPT(R)]      POC Strep A AG, EIA (Rapid Strep) (57846) [96295 CPT(R)]           Body aches        POC COVID-19 ([facility] [place]) [28413 CPT(R)]      POCT Influenza A/B Antigen [24401 CPT(R)]      POC Strep A AG, EIA (Rapid Strep) (02725) [36644 CPT(R)]           ORDERS WITHOUT AN ASSOCIATED DIAGNOSIS    azithromycin (ZITHROMAX) 250 MG tablet [20943]      fexofenadine-pseudoephedrine (ALLEGRA-D ALLERGY & CONGESTION) 60-120 MG per extended release tablet [27522]      benzonatate (TESSALON) 200 MG capsule [26953]               Follow up in 5-7days if symptoms persist or if symptoms worsen.       Subjective :  Patient presents for evaluation for sore throat, chills, sinus congestion, facial pressure.  Patient states she has had symptoms for the past several days.  Patient denies definite fevers but states she has bodyaches and chills.  Patient denies nausea or vomiting.  No shortness of breath, no chest pain.  Patient does sound congested on exam but otherwise stable is in no acute distress at this time.      History provided by:  Patient  Generalized Body Aches  Severity:  Mild  Onset quality:  Gradual  Associated symptoms: cough, myalgias, rhinorrhea and sore throat    Associated symptoms: no rash          Vitals:    05/07/23 1619   BP: 102/66   Pulse: 79   Resp: 16   Temp: 98.2 F (36.8 C)   TempSrc: Oral   SpO2: 98%   Weight: 68.7 kg (151 lb 8 oz)   Height: 1.702 m (5\' 7" )          Objective   Physical Exam  Vitals and nursing note reviewed.   Constitutional:       Appearance: Normal appearance.   HENT:      Head: Normocephalic.      Right  Ear: Tympanic membrane normal.      Left Ear: Tympanic membrane normal.      Nose: Nose normal.      Mouth/Throat:      Mouth: Mucous membranes are moist.      Pharynx: Oropharynx is clear.      Comments: Posterior pharyngeal erythema noted.  Uvula midline.  Mild tonsillar swelling without exudate.  No trismus.  Patient speaks in full sentences.  Eyes:      Extraocular Movements: Extraocular movements intact.      Pupils: Pupils are equal, round, and reactive to light.   Cardiovascular:      Rate and Rhythm: Normal rate and regular rhythm.      Pulses: Normal pulses.      Heart sounds: Normal heart sounds.   Pulmonary:      Effort: Pulmonary effort is normal.      Breath sounds: Normal  breath sounds.      Comments: Lung sounds are clear bilaterally.  No wheezing.  No tachypnea.  No dyspnea.  No trouble breathing.  Mild hacking cough noted.  Abdominal:      General: Abdomen is flat. Bowel sounds are normal.      Palpations: Abdomen is soft.   Musculoskeletal:         General: No tenderness or signs of injury. Normal range of motion.      Cervical back: Normal range of motion and neck supple.   Skin:     General: Skin is warm and dry.   Neurological:      General: No focal deficit present.      Mental Status: She is alert and oriented to person, place, and time.   Psychiatric:         Mood and Affect: Mood normal.         Behavior: Behavior normal.        I had a lengthy discussion with the patient regarding all test results.  Patient is to follow-up with her primary care physician for reevaluation of current symptoms and continued management.  Patient was given strict return precautions if symptoms worsen or new symptoms develop. After lengthy discussion regarding treatment planwas made to treat the patient.  Related to her clinical symptoms of pharyngitis.  I did exercise shared decision making.  Patient be placed on oral azithromycin as well as allergy decongestants and cough medication.  Patient again given strict  return precautions.      An electronic signature was used to authenticate this note.    --Mora Bellman, PA-C

## 2023-05-07 NOTE — Telephone Encounter (Signed)
Patient is requesting that her medication be sent to [address] [facility]. [facility]

## 2023-05-08 ENCOUNTER — Other Ambulatory Visit: Payer: Self-pay

## 2023-05-08 ENCOUNTER — Encounter: Payer: Managed Care, Other (non HMO) | Admitting: Obstetrics

## 2023-05-08 ENCOUNTER — Ambulatory Visit (INDEPENDENT_AMBULATORY_CARE_PROVIDER_SITE_OTHER): Payer: Managed Care, Other (non HMO) | Admitting: Obstetrics and Gynecology

## 2023-05-08 ENCOUNTER — Ambulatory Visit: Payer: Managed Care, Other (non HMO) | Attending: Obstetrics

## 2023-05-08 ENCOUNTER — Other Ambulatory Visit (HOSPITAL_COMMUNITY)
Admission: RE | Admit: 2023-05-08 | Discharge: 2023-05-08 | Disposition: A | Discharge status: Home / Self Care | Payer: Managed Care, Other (non HMO) | Source: Ambulatory Visit | Attending: Obstetrics and Gynecology | Admitting: Obstetrics and Gynecology

## 2023-05-08 VITALS — BP 125/82 | HR 104 | Wt 205.3 lb

## 2023-05-08 DIAGNOSIS — O0992 Supervision of high risk pregnancy, unspecified, second trimester: Secondary | ICD-10-CM

## 2023-05-08 DIAGNOSIS — O24113 Pre-existing diabetes mellitus, type 2, in pregnancy, third trimester: Secondary | ICD-10-CM

## 2023-05-08 DIAGNOSIS — Z3A35 35 weeks gestation of pregnancy: Secondary | ICD-10-CM | POA: Insufficient documentation

## 2023-05-08 DIAGNOSIS — O403XX0 Polyhydramnios, third trimester, not applicable or unspecified: Secondary | ICD-10-CM | POA: Insufficient documentation

## 2023-05-08 DIAGNOSIS — Z113 Encounter for screening for infections with a predominantly sexual mode of transmission: Secondary | ICD-10-CM

## 2023-05-08 DIAGNOSIS — O99213 Obesity complicating pregnancy, third trimester: Secondary | ICD-10-CM | POA: Diagnosis not present

## 2023-05-08 DIAGNOSIS — E669 Obesity, unspecified: Secondary | ICD-10-CM

## 2023-05-08 DIAGNOSIS — Z794 Long term (current) use of insulin: Secondary | ICD-10-CM

## 2023-05-08 DIAGNOSIS — Z23 Encounter for immunization: Secondary | ICD-10-CM

## 2023-05-08 DIAGNOSIS — E119 Type 2 diabetes mellitus without complications: Secondary | ICD-10-CM | POA: Diagnosis not present

## 2023-05-08 DIAGNOSIS — O24913 Unspecified diabetes mellitus in pregnancy, third trimester: Secondary | ICD-10-CM

## 2023-05-08 DIAGNOSIS — Z3685 Encounter for antenatal screening for Streptococcus B: Secondary | ICD-10-CM

## 2023-05-08 DIAGNOSIS — Z2911 Encounter for prophylactic immunotherapy for respiratory syncytial virus (RSV): Secondary | ICD-10-CM

## 2023-05-08 LAB — POCT URINALYSIS DIPSTICK OB
Bilirubin, UA: NEGATIVE
Glucose, UA: NEGATIVE
Leukocytes, UA: NEGATIVE
Nitrite, UA: NEGATIVE
Spec Grav, UA: 1.02 (ref 1.010–1.025)
Urobilinogen, UA: 0.2 U/dL
pH, UA: 7 (ref 5.0–8.0)

## 2023-05-08 NOTE — Progress Notes (Signed)
ROB: Patient is a 32 y.o. G1P0 at [redacted]w[redacted]d who presents for routine OB care.  Pregnancy is complicated by GERD (gastroesophageal reflux disease); Depression with anxiety; Attention deficit hyperactivity disorder (ADHD), predominantly inattentive type; Dyspepsia; Hidradenitis suppurativa; Sinus tachycardia; Hypertension associated with diabetes (HCC); Obesity (BMI 30.0-34.9); Pregnancy, supervision, high-risk; Susceptible to varicella (non-immune), currently pregnant; Pre-existing type 2 diabetes mellitus during pregnancy in third trimester; Polyhydramnios in third trimester.   Patient denies complaints. Is seen by MFM weekly for BPPs, one performed today was 8/8.  AFI appears to have normalized, currently 15.3 cm, previously was polyhydramnios on 10/2 with (AFI 24.5 cm, growth 91%). Reviewed blood sugars. Notes fastings are still 100s-150s, postprandials are 150s to 180s on average. Increased 70 of long acting by Endocrinology a week or 2 ago.  The Humalog increased to 20 units with meal, also still using sliding scale. Notes there is no rhyme or reason to her elevations as even with healthy meals she may have more significant elevations than with a bad  meal. Advised to increase to long-acting insulin to 74 units, and continue to increase her meal-time Humalog dosing by 2 units every 3 days.  If no significant improvement in her blood sugars, will likely need IOL between 37-38 weeks.  Would try to aim closer to 38 weeks now that polyhydramnios has resolved. 36 week cultures performed today in anticipation of likely earlier delivery. RSV vaccine and flu vaccine administered today. RTC in 1 week.

## 2023-05-08 NOTE — Progress Notes (Signed)
ROB [redacted]w[redacted]d: She is doing well. She reports good fetal movement. She will get flu and RSV vaccines today, as well as GBS and G/C. She has no new concerns.

## 2023-05-10 ENCOUNTER — Other Ambulatory Visit: Payer: Managed Care, Other (non HMO)

## 2023-05-10 LAB — CERVICOVAGINAL ANCILLARY ONLY
Chlamydia: NEGATIVE
Comment: NEGATIVE
Comment: NORMAL
Neisseria Gonorrhea: NEGATIVE

## 2023-05-10 LAB — STREP GP B NAA: Strep Gp B NAA: NEGATIVE

## 2023-05-14 ENCOUNTER — Ambulatory Visit (INDEPENDENT_AMBULATORY_CARE_PROVIDER_SITE_OTHER): Payer: Managed Care, Other (non HMO) | Admitting: Certified Nurse Midwife

## 2023-05-14 ENCOUNTER — Encounter: Payer: Managed Care, Other (non HMO) | Admitting: Obstetrics & Gynecology

## 2023-05-14 ENCOUNTER — Encounter: Payer: Self-pay | Admitting: Certified Nurse Midwife

## 2023-05-14 VITALS — BP 132/86 | HR 103 | Wt 206.4 lb

## 2023-05-14 DIAGNOSIS — O0992 Supervision of high risk pregnancy, unspecified, second trimester: Secondary | ICD-10-CM

## 2023-05-14 DIAGNOSIS — Z3A36 36 weeks gestation of pregnancy: Secondary | ICD-10-CM

## 2023-05-14 LAB — POCT URINALYSIS DIPSTICK OB
Bilirubin, UA: NEGATIVE
Blood, UA: NEGATIVE
Glucose, UA: NEGATIVE
Ketones, UA: NEGATIVE
Leukocytes, UA: NEGATIVE
Nitrite, UA: NEGATIVE
Spec Grav, UA: 1.025 (ref 1.010–1.025)
Urobilinogen, UA: 0.2 U/dL
pH, UA: 6.5 (ref 5.0–8.0)

## 2023-05-14 NOTE — Patient Instructions (Signed)
Labor Induction Labor induction is when steps are taken to cause a pregnant woman to begin the labor process. Most women go into labor on their own between 37 weeks and 42 weeks of pregnancy. When this does not happen, or when there is a medical need for labor to begin, steps may be taken to induce, or bring on, labor. Labor induction causes a pregnant woman's uterus to contract. It also causes the cervix to soften (ripen), open (dilate), and thin out. Usually, labor is not induced before 39 weeks of pregnancy unless there is a medical reason to do so. When is labor induction considered? Labor induction may be right for you if: Your pregnancy lasts longer than 41 to 42 weeks. Your placenta is separating from your uterus (placental abruption). You have a rupture of membranes and your labor does not begin. You have health problems, like diabetes or high blood pressure (preeclampsia) during your pregnancy. Your baby has stopped growing or does not have enough amniotic fluid. Before labor induction begins, your health care provider will consider the following factors: Your medical condition and the baby's condition. How many weeks you have been pregnant. How mature the baby's lungs are. The condition of your cervix. The position of the baby. The size of your birth canal. Tell a health care provider about: Any allergies you have. All medicines you are taking, including vitamins, herbs, eye drops, creams, and over-the-counter medicines. Any problems you or your family members have had with anesthetic medicines. Any surgeries you have had. Any blood disorders you have. Any medical conditions you have. What are the risks? Generally, this is a safe procedure. However, problems may occur, including: Failed induction. Changes in fetal heart rate, such as being too high, too low, or irregular (erratic). Infection in the mother or the baby. Increased risk of having a cesarean delivery. Breaking off  (abruption) of the placenta from the uterus. This is rare. Rupture of the uterus. This is very rare. Your baby could fail to get enough blood flow or oxygen. This can be life-threatening. When induction is needed for medical reasons, the benefits generally outweigh the risks. What happens during the procedure? During the procedure, your health care provider will use one of these methods to induce labor: Stripping the membranes. In this method, the amniotic sac tissue is gently separated from the cervix. This causes the following to happen: Your cervix stretches, which in turn causes the release of prostaglandins. Prostaglandins induce labor and cause the uterus to contract. This procedure is often done in an office visit. You will be sent home to wait for contractions to begin. Prostaglandin medicine. This medicine starts contractions and causes the cervix to dilate and ripen. This can be taken by mouth (orally) or by being inserted into the vagina (suppository). Inserting a small, thin tube (catheter) with a balloon into the vagina and then expanding the balloon with water to dilate the cervix. Breaking the water. In this method, a small instrument is used to make a small hole in the amniotic sac. This eventually causes the amniotic sac to break. Contractions should begin within a few hours. Medicine to trigger or strengthen contractions. This medicine is given through an IV that is inserted into a vein in your arm. This procedure may vary among health care providers and hospitals. Where to find more information [employer]: www.marchofdimes.org [employer]: www.acog.org Summary Labor induction causes a pregnant woman's uterus to contract. It also causes the cervix  to soften (ripen), open (dilate), and thin out. Labor is usually not induced before 39 weeks of pregnancy unless there is a medical reason to do so. When induction is needed for medical  reasons, the benefits generally outweigh the risks. Talk with your health care provider about which methods of labor induction are right for you. This information is not intended to replace advice given to you by your health care provider. Make sure you discuss any questions you have with your health care provider. Document Revised: 04/28/2020 Document Reviewed: 04/28/2020 Elsevier Patient Education  2024 [employer].

## 2023-05-14 NOTE — Progress Notes (Signed)
ROB doing well, feeling good movement. BS fastings 86- 143 , 2 hr pp reamin 130s-160s. Consulted Dr. Valentino Saxon regarding delivery planning. Will plan to deliver in the 37th week. Will scheduled today. Pt has MFM appointment tomorrow. Follow up 1 wk Monday/Tuesday.   Doreene Burke, CNM

## 2023-05-15 ENCOUNTER — Other Ambulatory Visit: Payer: Self-pay

## 2023-05-15 ENCOUNTER — Ambulatory Visit: Payer: Managed Care, Other (non HMO) | Attending: Obstetrics

## 2023-05-15 DIAGNOSIS — O99213 Obesity complicating pregnancy, third trimester: Secondary | ICD-10-CM | POA: Diagnosis not present

## 2023-05-15 DIAGNOSIS — O24913 Unspecified diabetes mellitus in pregnancy, third trimester: Secondary | ICD-10-CM

## 2023-05-15 DIAGNOSIS — Z3A36 36 weeks gestation of pregnancy: Secondary | ICD-10-CM | POA: Insufficient documentation

## 2023-05-15 DIAGNOSIS — E669 Obesity, unspecified: Secondary | ICD-10-CM

## 2023-05-15 DIAGNOSIS — O24113 Pre-existing diabetes mellitus, type 2, in pregnancy, third trimester: Secondary | ICD-10-CM | POA: Insufficient documentation

## 2023-05-15 DIAGNOSIS — E119 Type 2 diabetes mellitus without complications: Secondary | ICD-10-CM | POA: Diagnosis not present

## 2023-05-15 DIAGNOSIS — O403XX0 Polyhydramnios, third trimester, not applicable or unspecified: Secondary | ICD-10-CM | POA: Insufficient documentation

## 2023-05-15 DIAGNOSIS — Z794 Long term (current) use of insulin: Secondary | ICD-10-CM

## 2023-05-15 DIAGNOSIS — Z362 Encounter for other antenatal screening follow-up: Secondary | ICD-10-CM | POA: Insufficient documentation

## 2023-05-17 ENCOUNTER — Other Ambulatory Visit: Payer: Managed Care, Other (non HMO)

## 2023-05-21 ENCOUNTER — Ambulatory Visit (INDEPENDENT_AMBULATORY_CARE_PROVIDER_SITE_OTHER): Payer: Managed Care, Other (non HMO) | Admitting: Obstetrics and Gynecology

## 2023-05-21 ENCOUNTER — Encounter: Payer: Self-pay | Admitting: Obstetrics and Gynecology

## 2023-05-21 VITALS — BP 134/87 | HR 116 | Wt 206.0 lb

## 2023-05-21 DIAGNOSIS — O0992 Supervision of high risk pregnancy, unspecified, second trimester: Secondary | ICD-10-CM

## 2023-05-21 DIAGNOSIS — Z3A37 37 weeks gestation of pregnancy: Secondary | ICD-10-CM

## 2023-05-21 DIAGNOSIS — O24113 Pre-existing diabetes mellitus, type 2, in pregnancy, third trimester: Secondary | ICD-10-CM

## 2023-05-21 LAB — POCT URINALYSIS DIPSTICK OB
Bilirubin, UA: NEGATIVE
Blood, UA: NEGATIVE
Glucose, UA: NEGATIVE
Ketones, UA: NEGATIVE
Leukocytes, UA: NEGATIVE
Nitrite, UA: NEGATIVE
Spec Grav, UA: 1.01 (ref 1.010–1.025)
Urobilinogen, UA: 0.2 U/dL
pH, UA: 6.5 (ref 5.0–8.0)

## 2023-05-21 NOTE — Progress Notes (Signed)
ROB: Patient is a 32 y.o. G1P0 at [redacted]w[redacted]d who presents for routine OB care.  Pregnancy is complicated by: Anxiety; GERD (gastroesophageal reflux disease); Type 2 diabetes mellitus with hyperglycemia, with long-term current use of insulin (HCC); Depression with anxiety; Attention deficit hyperactivity disorder (ADHD), predominantly inattentive type; Dyspepsia; Hidradenitis suppurativa; Sinus tachycardia; Hypertension associated with diabetes (HCC); Obesity (BMI 30.0-34.9); Pregnancy, supervision, high-risk; Susceptible to varicella (non-immune), currently pregnant; Pre-existing type 2 diabetes mellitus during pregnancy in third trimester; Polyhydramnios in third trimester (resolved at 35 weeks).   Patient denies major complaints. Notes that this week her blood sugars have been on the lower end, receives alerts from her Freestyle sensor several times over the past few days.  Is scheduled for IOL due to uncontrolled GDM. Discussed induction process in detail. Also discussed pain management options in labor. To f/u postpartum at 2 week visit.

## 2023-05-21 NOTE — Progress Notes (Signed)
ROB [redacted]w[redacted]d: She is doing well. She reports good fetal movement and Braxton Hick's contractions. She has no new concerns today.

## 2023-05-22 ENCOUNTER — Other Ambulatory Visit: Payer: Managed Care, Other (non HMO)

## 2023-05-22 ENCOUNTER — Ambulatory Visit: Payer: Managed Care, Other (non HMO)

## 2023-05-23 ENCOUNTER — Encounter: Payer: Self-pay | Admitting: Obstetrics and Gynecology

## 2023-05-23 ENCOUNTER — Other Ambulatory Visit: Payer: Self-pay

## 2023-05-23 ENCOUNTER — Inpatient Hospital Stay
Admission: EM | Admit: 2023-05-23 | Discharge: 2023-05-26 | DRG: 788 | Disposition: A | Discharge status: Home / Self Care | Payer: Managed Care, Other (non HMO) | Attending: Obstetrics and Gynecology | Admitting: Obstetrics and Gynecology

## 2023-05-23 DIAGNOSIS — O1404 Mild to moderate pre-eclampsia, complicating childbirth: Secondary | ICD-10-CM | POA: Diagnosis present

## 2023-05-23 DIAGNOSIS — O09899 Supervision of other high risk pregnancies, unspecified trimester: Secondary | ICD-10-CM

## 2023-05-23 DIAGNOSIS — E119 Type 2 diabetes mellitus without complications: Secondary | ICD-10-CM | POA: Diagnosis present

## 2023-05-23 DIAGNOSIS — K219 Gastro-esophageal reflux disease without esophagitis: Secondary | ICD-10-CM | POA: Diagnosis present

## 2023-05-23 DIAGNOSIS — Z3A37 37 weeks gestation of pregnancy: Secondary | ICD-10-CM | POA: Diagnosis not present

## 2023-05-23 DIAGNOSIS — Z833 Family history of diabetes mellitus: Secondary | ICD-10-CM

## 2023-05-23 DIAGNOSIS — Z23 Encounter for immunization: Secondary | ICD-10-CM

## 2023-05-23 DIAGNOSIS — O24113 Pre-existing diabetes mellitus, type 2, in pregnancy, third trimester: Secondary | ICD-10-CM | POA: Diagnosis present

## 2023-05-23 DIAGNOSIS — O9962 Diseases of the digestive system complicating childbirth: Secondary | ICD-10-CM | POA: Diagnosis present

## 2023-05-23 DIAGNOSIS — Z7982 Long term (current) use of aspirin: Secondary | ICD-10-CM | POA: Diagnosis not present

## 2023-05-23 DIAGNOSIS — Z87891 Personal history of nicotine dependence: Secondary | ICD-10-CM

## 2023-05-23 DIAGNOSIS — O2412 Pre-existing diabetes mellitus, type 2, in childbirth: Principal | ICD-10-CM | POA: Diagnosis present

## 2023-05-23 DIAGNOSIS — O368390 Maternal care for abnormalities of the fetal heart rate or rhythm, unspecified trimester, not applicable or unspecified: Secondary | ICD-10-CM | POA: Diagnosis present

## 2023-05-23 DIAGNOSIS — Z7984 Long term (current) use of oral hypoglycemic drugs: Secondary | ICD-10-CM

## 2023-05-23 DIAGNOSIS — Z818 Family history of other mental and behavioral disorders: Secondary | ICD-10-CM | POA: Diagnosis not present

## 2023-05-23 DIAGNOSIS — Z98891 History of uterine scar from previous surgery: Principal | ICD-10-CM

## 2023-05-23 DIAGNOSIS — O1490 Unspecified pre-eclampsia, unspecified trimester: Secondary | ICD-10-CM | POA: Diagnosis present

## 2023-05-23 DIAGNOSIS — Z794 Long term (current) use of insulin: Secondary | ICD-10-CM

## 2023-05-23 DIAGNOSIS — O1493 Unspecified pre-eclampsia, third trimester: Secondary | ICD-10-CM | POA: Diagnosis not present

## 2023-05-23 DIAGNOSIS — Z8249 Family history of ischemic heart disease and other diseases of the circulatory system: Secondary | ICD-10-CM

## 2023-05-23 DIAGNOSIS — O99214 Obesity complicating childbirth: Secondary | ICD-10-CM | POA: Diagnosis present

## 2023-05-23 DIAGNOSIS — E66811 Obesity, class 1: Secondary | ICD-10-CM | POA: Diagnosis present

## 2023-05-23 DIAGNOSIS — E669 Obesity, unspecified: Secondary | ICD-10-CM | POA: Diagnosis not present

## 2023-05-23 DIAGNOSIS — O1494 Unspecified pre-eclampsia, complicating childbirth: Secondary | ICD-10-CM | POA: Diagnosis not present

## 2023-05-23 DIAGNOSIS — O0992 Supervision of high risk pregnancy, unspecified, second trimester: Secondary | ICD-10-CM

## 2023-05-23 HISTORY — DX: Unspecified pre-eclampsia, unspecified trimester: O14.90

## 2023-05-23 LAB — COMPREHENSIVE METABOLIC PANEL
ALT: 18 U/L (ref 0–44)
AST: 24 U/L (ref 15–41)
Albumin: 3.1 g/dL — ABNORMAL LOW (ref 3.5–5.0)
Alkaline Phosphatase: 176 U/L — ABNORMAL HIGH (ref 38–126)
Anion gap: 10 (ref 5–15)
BUN: 11 mg/dL (ref 6–20)
CO2: 19 mmol/L — ABNORMAL LOW (ref 22–32)
Calcium: 9.6 mg/dL (ref 8.9–10.3)
Chloride: 105 mmol/L (ref 98–111)
Creatinine, Ser: 0.93 mg/dL (ref 0.44–1.00)
GFR, Estimated: >60 mL/min (ref >60)
Glucose, Bld: 133 mg/dL — ABNORMAL HIGH (ref 70–99)
Potassium: 4 mmol/L (ref 3.5–5.1)
Sodium: 134 mmol/L — ABNORMAL LOW (ref 135–145)
Total Bilirubin: 0.6 mg/dL (ref 0.3–1.2)
Total Protein: 7.8 g/dL (ref 6.5–8.1)

## 2023-05-23 LAB — CBC
HCT: 38.4 % (ref 36.0–46.0)
Hemoglobin: 12.8 g/dL (ref 12.0–15.0)
MCH: 26.9 pg (ref 26.0–34.0)
MCHC: 33.3 g/dL (ref 30.0–36.0)
MCV: 80.7 fL (ref 80.0–100.0)
Platelets: 292 10*3/uL (ref 150–400)
RBC: 4.76 MIL/uL (ref 3.87–5.11)
RDW: 16.4 % — ABNORMAL HIGH (ref 11.5–15.5)
WBC: 8.5 10*3/uL (ref 4.0–10.5)
nRBC: 0 % (ref 0.0–0.2)

## 2023-05-23 LAB — TYPE AND SCREEN
ABO/RH(D): AB POS
Antibody Screen: NEGATIVE

## 2023-05-23 LAB — GLUCOSE, CAPILLARY
Glucose-Capillary: 103 mg/dL — ABNORMAL HIGH (ref 70–99)
Glucose-Capillary: 117 mg/dL — ABNORMAL HIGH (ref 70–99)
Glucose-Capillary: 146 mg/dL — ABNORMAL HIGH (ref 70–99)

## 2023-05-23 LAB — PROTEIN / CREATININE RATIO, URINE
Creatinine, Urine: 64 mg/dL
Protein Creatinine Ratio: 1.34 mg/mg{creat} — ABNORMAL HIGH (ref 0.00–0.15)
Total Protein, Urine: 86 mg/dL

## 2023-05-23 LAB — ABO/RH: ABO/RH(D): AB POS

## 2023-05-23 MED ORDER — LACTATED RINGERS IV SOLN: 500.0000 mL | Freq: Once | INTRAVENOUS | Status: DC

## 2023-05-23 MED ORDER — LACTATED RINGERS IV SOLN: INTRAVENOUS | Status: DC

## 2023-05-23 MED ORDER — MISOPROSTOL 25 MCG QUARTER TABLET
ORAL_TABLET | ORAL | Status: AC
  Filled 2023-05-23: qty 1

## 2023-05-23 MED ORDER — PHENYLEPHRINE 80 MCG/ML (10ML) SYRINGE FOR IV PUSH (FOR BLOOD PRESSURE SUPPORT): 80.0000 ug | PREFILLED_SYRINGE | INTRAVENOUS | Status: DC | PRN

## 2023-05-23 MED ORDER — LACTATED RINGERS IV SOLN
500.0000 mL | INTRAVENOUS | Status: DC | PRN
  Administered 2023-05-23 – 2023-05-24 (×2): 1000 mL via INTRAVENOUS

## 2023-05-23 MED ORDER — MISOPROSTOL 50MCG HALF TABLET
50.0000 ug | ORAL_TABLET | Freq: Once | ORAL | Status: AC
  Administered 2023-05-23: 50 ug via VAGINAL
  Filled 2023-05-23: qty 1

## 2023-05-23 MED ORDER — INSULIN REGULAR(HUMAN) IN NACL 100-0.9 UT/100ML-% IV SOLN
INTRAVENOUS | Status: DC
  Administered 2023-05-24: 5 [IU]/h via INTRAVENOUS
  Filled 2023-05-23 (×3): qty 100

## 2023-05-23 MED ORDER — MISOPROSTOL 50MCG HALF TABLET: 50.0000 ug | ORAL_TABLET | Freq: Once | ORAL | Status: DC

## 2023-05-23 MED ORDER — HYDROXYZINE HCL 25 MG PO TABS
25.0000 mg | ORAL_TABLET | Freq: Every evening | ORAL | Status: DC | PRN
  Administered 2023-05-23: 25 mg via ORAL
  Filled 2023-05-23: qty 1

## 2023-05-23 MED ORDER — DEXTROSE IN LACTATED RINGERS 5 % IV SOLN: INTRAVENOUS | Status: DC

## 2023-05-23 MED ORDER — ACETAMINOPHEN 325 MG PO TABS
650.0000 mg | ORAL_TABLET | ORAL | Status: DC | PRN
  Administered 2023-05-23: 650 mg via ORAL
  Filled 2023-05-23: qty 2

## 2023-05-23 MED ORDER — DEXTROSE 50 % IV SOLN: 0.0000 mL | INTRAVENOUS | Status: DC | PRN

## 2023-05-23 MED ORDER — HYDRALAZINE HCL 20 MG/ML IJ SOLN: 10.0000 mg | INTRAMUSCULAR | Status: DC | PRN

## 2023-05-23 MED ORDER — ZOLPIDEM TARTRATE 5 MG PO TABS: 5.0000 mg | ORAL_TABLET | Freq: Every evening | ORAL | Status: DC | PRN

## 2023-05-23 MED ORDER — MISOPROSTOL 25 MCG QUARTER TABLET
25.0000 ug | ORAL_TABLET | Freq: Once | ORAL | Status: AC
  Administered 2023-05-23: 25 ug via ORAL
  Filled 2023-05-23: qty 1

## 2023-05-23 MED ORDER — LABETALOL HCL 5 MG/ML IV SOLN: 20.0000 mg | INTRAVENOUS | Status: DC | PRN

## 2023-05-23 MED ORDER — EPHEDRINE 5 MG/ML INJ: 10.0000 mg | INTRAVENOUS | Status: DC | PRN

## 2023-05-23 MED ORDER — OXYTOCIN 10 UNIT/ML IJ SOLN
INTRAMUSCULAR | Status: AC
  Filled 2023-05-23: qty 2

## 2023-05-23 MED ORDER — FENTANYL-BUPIVACAINE-NACL 0.5-0.125-0.9 MG/250ML-% EP SOLN
12.0000 mL/h | EPIDURAL | Status: DC | PRN
  Administered 2023-05-24: 12 mL/h via EPIDURAL
  Filled 2023-05-23: qty 250

## 2023-05-23 MED ORDER — LABETALOL HCL 5 MG/ML IV SOLN: 80.0000 mg | INTRAVENOUS | Status: DC | PRN

## 2023-05-23 MED ORDER — MISOPROSTOL 50MCG HALF TABLET: 50.0000 ug | ORAL_TABLET | ORAL | Status: DC

## 2023-05-23 MED ORDER — MISOPROSTOL 50MCG HALF TABLET
ORAL_TABLET | ORAL | Status: AC
  Administered 2023-05-23: 50 ug
  Filled 2023-05-23: qty 1

## 2023-05-23 MED ORDER — LABETALOL HCL 5 MG/ML IV SOLN: 40.0000 mg | INTRAVENOUS | Status: DC | PRN

## 2023-05-23 MED ORDER — OXYTOCIN BOLUS FROM INFUSION: 333.0000 mL | Freq: Once | INTRAVENOUS | Status: DC

## 2023-05-23 MED ORDER — SOD CITRATE-CITRIC ACID 500-334 MG/5ML PO SOLN: 30.0000 mL | ORAL | Status: DC | PRN

## 2023-05-23 MED ORDER — MISOPROSTOL 200 MCG PO TABS
ORAL_TABLET | ORAL | Status: AC
  Filled 2023-05-23: qty 4

## 2023-05-23 MED ORDER — OXYTOCIN-SODIUM CHLORIDE 30-0.9 UT/500ML-% IV SOLN
2.5000 [IU]/h | INTRAVENOUS | Status: DC
  Administered 2023-05-24: 2.5 [IU]/h via INTRAVENOUS
  Filled 2023-05-23 (×2): qty 500

## 2023-05-23 MED ORDER — TERBUTALINE SULFATE 1 MG/ML IJ SOLN
0.2500 mg | Freq: Once | INTRAMUSCULAR | Status: DC | PRN
  Filled 2023-05-23: qty 1

## 2023-05-23 MED ORDER — LIDOCAINE HCL (PF) 1 % IJ SOLN
30.0000 mL | INTRAMUSCULAR | Status: DC | PRN
  Filled 2023-05-23: qty 30

## 2023-05-23 MED ORDER — DIPHENHYDRAMINE HCL 50 MG/ML IJ SOLN: 12.5000 mg | INTRAMUSCULAR | Status: DC | PRN

## 2023-05-23 MED ORDER — ONDANSETRON HCL 4 MG/2ML IJ SOLN: 4.0000 mg | Freq: Four times a day (QID) | INTRAMUSCULAR | Status: DC | PRN

## 2023-05-23 MED ORDER — AMMONIA AROMATIC IN INHA
RESPIRATORY_TRACT | Status: AC
  Filled 2023-05-23: qty 10

## 2023-05-23 MED ORDER — FENTANYL CITRATE (PF) 100 MCG/2ML IJ SOLN
50.0000 ug | INTRAMUSCULAR | Status: DC | PRN
  Administered 2023-05-24: 50 ug via INTRAVENOUS
  Administered 2023-05-24: 100 ug via INTRAVENOUS
  Filled 2023-05-23 (×2): qty 2

## 2023-05-23 NOTE — Progress Notes (Signed)
Labor Progress Note   ASSESSMENT/PLAN   Dawn Ortiz 32 y.o.   G1P0  at [redacted]w[redacted]d here for IOL for T2IDDM and newly diagnosed preeclampsia.  FWB:  - Fetal well being assessed: Category 1        GBS: - GBS negative  LABOR: -  Cervical ripening, doing well. - Has had one severe range BP. Will treat and initiate mag sulfate if there is another severe range. - Blood glucose 146. Will initiate Endotool if next blood glucose is >120 - Pain Management: Labor support without medications, plans epidural - Anticipate SVD  - Dr. Lonny Prude updated Labor Progress 1617 - misoprostol #1 2032 - misoprostol #2. SVE 0/50/ballotable  Principal Problem:   Labor and delivery, indication for care Active Problems:   Preeclampsia   SUBJECTIVE/OBJECTIVE   SUBJECTIVE:   Dawn Ortiz denies HA, visual changes, and epigastric pain. May want something for sleep during cervical ripening process  OBJECTIVE: Vital Signs: Patient Vitals for the past 12 hrs:  BP Temp Temp src Pulse Resp Height Weight  05/23/23 2038 (!) 142/92 -- -- (!) 104 -- -- --  05/23/23 2003 (!) 144/89 -- -- (!) 112 -- -- --  05/23/23 1950 (!) 151/92 -- -- (!) 106 -- -- --  05/23/23 1934 (!) 164/98 -- -- (!) 108 -- -- --  05/23/23 1933 (!) 158/97 -- -- (!) 111 -- -- --  05/23/23 1902 -- 98.5 F (36.9 C) Oral -- -- -- --  05/23/23 1900 139/89 -- -- (!) 109 -- -- --  05/23/23 1829 134/88 -- -- (!) 101 -- -- --  05/23/23 1748 (!) 147/90 -- -- 98 -- -- --  05/23/23 1552 (!) 149/96 -- -- (!) 107 -- -- --  05/23/23 1530 (!) 164/93 98.7 F (37.1 C) Oral (!) 123 18 5\' 2"  (1.575 m) 93.4 kg    Last SVE:  Dilation: Closed Effacement (%): 50 Cervical Position: Posterior Station: Ballotable Presentation: Vertex Exam by:: Dawn Ortiz, CNM -  ,  ,  ,    FHR:   - Mode: External  - Baseline Rate (A): 150 bpm (fht).   -  Moderate variability  - Characteristics (ie - accels, decels): Accelerations: 15 x 15  -  Decels absent  UTERINE ACTIVITY:   -  Mode: Toco  - Contraction Frequency (min): 1-5 minutes  Dawn Ortiz, CNM

## 2023-05-23 NOTE — Progress Notes (Signed)
Pt arrived to  L/D for scheduled IOL with support person.

## 2023-05-23 NOTE — Progress Notes (Signed)
Labor Progress Note   ASSESSMENT/PLAN   Dawn Ortiz 32 y.o.   G1P0  at [redacted]w[redacted]d here for IOL for IDDM. She was diagnosed with preeclampsia by BPs and PC ratio of 1.34.  FWB:  - Fetal well being assessed: Category 1       GBS: - GBS negative  LABOR: -  Cevical ripening in progress - Discussed preeclampsia diagnosis and plan of care with Dawn Ortiz - Pain Management: Labor support without medications and plans epidural - Anticipate SVD  - Updated Dr. Lonny Prude Labor Progress  Principal Problem:   Labor and delivery, indication for care Active Problems:   Preeclampsia   SUBJECTIVE/OBJECTIVE   SUBJECTIVE:   Dawn Ortiz is starting to feel some of her contractions. She is coping well. She denies HA, visual changes, and epigastric pain right now.  OBJECTIVE: Vital Signs: Patient Vitals for the past 12 hrs:  BP Temp Temp src Pulse Resp Height Weight  05/23/23 1902 -- 98.5 F (36.9 C) Oral -- -- -- --  05/23/23 1900 139/89 -- -- (!) 109 -- -- --  05/23/23 1829 134/88 -- -- (!) 101 -- -- --  05/23/23 1748 (!) 147/90 -- -- 98 -- -- --  05/23/23 1552 (!) 149/96 -- -- (!) 107 -- -- --  05/23/23 1530 (!) 164/93 98.7 F (37.1 C) Oral (!) 123 18 5\' 2"  (1.575 m) 93.4 kg    Last SVE:  Dilation: Closed Effacement (%): 30 Cervical Position: Posterior Station: Ballotable Presentation: Vertex Exam by:: Dawn Ortiz CNM -  ,  ,  ,    FHR:   - Mode: External  - Baseline: 150 -  Moderate variability  - Characteristics (ie - accels, decels): Accelerations: 15 x 15. No decels  UTERINE ACTIVITY:   - Mode: Toco  - Contractions: q 2-5 min  Glenetta Borg, CNM

## 2023-05-23 NOTE — H&P (Addendum)
[facility] Labor & Delivery  History and Physical  ASSESSMENT AND PLAN   Dawn Ortiz is a 32 y.o. G1P0 at [redacted]w[redacted]d with EDD: 06/08/2023, by Last Menstrual Period admitted for IOL for T2DM (insulin dependent). Her blood pressure was elevated on admission.  -Admit to L&D -Admission labs, preeclampsia labs -Q4 blood sugars. EndoTool if indicated -Misoprostol for cervical ripening. Cook catheter when appropriate. -Continuous EFM -Reviewed pain management options -Dr. Lonny Prude notified of admission   Fetal Status: - cephalic presentation by BSUS - EFW: 7.5-8 lbs by Leopold's - FHT currently category 1   Labs/Immunizations: Prenatal labs and studies: ABO, Rh: --/--/PENDING (10/24 1554) Antibody: PENDING (10/24 1554) Rubella: 4.32 (04/24 1425) Varicella: <135 (04/24 1425)  RPR: Non Reactive (08/21 1025)  HBsAg: Negative (04/24 1425)  HepC: Non Reactive (04/24 1425) HIV: Non Reactive (04/24 1425)  GBS: Negative/-- (10/09 1538)   TDAP: received prenatally Flu: received 05/08/23 RSV: no  Postpartum Plan: - Feeding: Breast Milk - Prenatal Care Provider: AOB     HPI   Chief Complaint: IOL  Dawn Ortiz is a 32 y.o. G1P0 at [redacted]w[redacted]d who presents for IOL for insulin-dependent T2DM. She denies ctx, LOF, and vaginal bleeding. She endorses good fetal movement. She has had a mild headache today which she attributes to not eating anything. She denies visual changes and RUQ pain.    Pregnancy Complications Patient Active Problem List   Diagnosis Date Noted   Labor and delivery, indication for care 05/23/2023   Pre-existing type 2 diabetes mellitus during pregnancy in third trimester 05/03/2023   Polyhydramnios in third trimester 05/03/2023   [redacted] weeks gestation of pregnancy 05/03/2023   Pregnancy, supervision, high-risk 11/28/2022   Susceptible to varicella (non-immune), currently pregnant 11/28/2022   Hidradenitis suppurativa 04/29/2020   Sinus tachycardia  04/29/2020   Hypertension associated with diabetes (HCC) 04/29/2020   Obesity (BMI 30.0-34.9) 04/29/2020   Dyspepsia    Attention deficit hyperactivity disorder (ADHD), 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   Depression with anxiety 05/18/2019   Type 2 diabetes mellitus with hyperglycemia, with long-term current use of insulin (HCC) 02/06/2019   Anxiety    GERD (gastroesophageal reflux disease)    Abscess of left axilla 07/02/2016    Review of Systems A twelve point review of systems was negative except as stated in HPI.   HISTORY   Medications Medications Prior to Admission  Medication Sig Dispense Refill Last Dose   aspirin EC 81 MG tablet Take 162 mg by mouth daily. Swallow whole.   05/22/2023   B-D ULTRAFINE III SHORT PEN 31G X 8 MM MISC    05/22/2023   BD PEN NEEDLE MICRO U/F 32G X 6 MM MISC Inject into the skin.   05/22/2023   Continuous Glucose Sensor (FREESTYLE LIBRE 3 SENSOR) MISC    05/23/2023   insulin lispro (HUMALOG) 100 UNIT/ML KwikPen Inject 100 Units into the skin 3 (three) times daily. Pt states she is taking 20 units 3 times a day and on sliding scale.   05/22/2023   metFORMIN (GLUCOPHAGE-XR) 500 MG 24 hr tablet Take 500 mg by mouth 2 (two) times daily.   05/22/2023   omeprazole (PRILOSEC) 40 MG capsule Take 1 capsule (40 mg total) by mouth daily. 30 minutes before food 90 capsule 3 05/22/2023   Prenatal Vit-Fe Fumarate-FA (MULTIVITAMIN-PRENATAL) 27-0.8 MG TABS tablet Take 1 tablet by mouth daily at 12 noon.   05/22/2023   TRESIBA FLEXTOUCH 100 UNIT/ML FlexTouch Pen Inject 65 Units into the  skin daily.   05/23/2023   Clindamycin Phosphate foam Apply to affected areas 1-2 times daily as needed for outbreaks. (Patient not taking: Reported on 04/24/2023) 100 g 3    PRESCRIPTION MEDICATION 1 Device by Does not apply route as directed. Libra Monitor       Allergies is allergic to sulfa antibiotics.   OB History OB History  Gravida Para Term Preterm AB Living  1  0 0 0 0 0  SAB IAB Ectopic Multiple Live Births  0 0 0 0 0    # Outcome Date GA Lbr Len/2nd Weight Sex Type Anes PTL Lv  1 Current             Past Medical History Past Medical History:  Diagnosis Date   Abscess of left breast    Abscess of left thigh    Abscess of right breast    Abscess of right thigh    Abscess of the breast and nipple 03/02/2019   Anxiety    Axillary abscess 07/02/2016   Biliary colic    Diabetes mellitus without complication (HCC)    GERD (gastroesophageal reflux disease)    Leukocytosis 07/19/2019   Migraines     Past Surgical History Past Surgical History:  Procedure Laterality Date   CHOLECYSTECTOMY     ESOPHAGOGASTRODUODENOSCOPY (EGD) WITH PROPOFOL N/A 10/01/2019   Procedure: ESOPHAGOGASTRODUODENOSCOPY (EGD) WITH PROPOFOL;  Surgeon: Toney Reil, MD;  Location: [facility];  Service: Gastroenterology;  Laterality: N/A;   I & D EXTREMITY Left 03/05/2019   Procedure: Irrigation and debridement;  Surgeon: Henrene Dodge, MD;  Location: [facility];  Service: General;  Laterality: Left;   INCISION AND DRAINAGE     right breast x1 and left groin x1 and left axilla   INCISION AND DRAINAGE ABSCESS Left 07/02/2016   Procedure: INCISION AND DRAINAGE ABSCESS;  Surgeon: Lattie Haw, MD;  Location: [facility];  Service: General;  Laterality: Left;   INCISION AND DRAINAGE ABSCESS Left 04/18/2017   Procedure: INCISION AND DRAINAGE ABSCESS-LEFT GROIN;  Surgeon: Ancil Linsey, MD;  Location: [facility];  Service: General;  Laterality: Left;   INCISION AND DRAINAGE ABSCESS Right 05/21/2018   Procedure: INCISION AND DRAINAGE ABSCESS- RIGHT THIGH;  Surgeon: Henrene Dodge, MD;  Location: [facility];  Service: General;  Laterality: Right;   INCISION AND DRAINAGE ABSCESS Left 08/23/2020   Procedure: INCISION AND DRAINAGE ABSCESS, axillary;  Surgeon: Henrene Dodge, MD;  Location: [facility];  Service: General;  Laterality: Left;  provider requesting 2 hours /120  minutes for procedure   IRRIGATION AND DEBRIDEMENT ABSCESS Right 04/09/2018   Procedure: IRRIGATION AND DEBRIDEMENT ABSCESS;  Surgeon: Henrene Dodge, MD;  Location: [facility];  Service: General;  Laterality: Right;   IRRIGATION AND DEBRIDEMENT ABSCESS Left 05/21/2018   Procedure: IRRIGATION AND DEBRIDEMENT BREAST ABSCESS;  Surgeon: Henrene Dodge, MD;  Location: [facility];  Service: General;  Laterality: Left;   IRRIGATION AND DEBRIDEMENT ABSCESS Right 01/06/2020   Procedure: MINOR INCISION AND DRAINAGE OF ABSCESS, right axillary;  Surgeon: Campbell Lerner, MD;  Location: [facility];  Service: General;  Laterality: Right;   PILONIDAL CYST EXCISION  12/03/2014   Procedure: CYST EXCISION PILONIDAL EXTENSIVE;  Surgeon: Duwaine Maxin, MD;  Location: [facility];  Service: General;;   WISDOM TOOTH EXTRACTION     fourl; sometime 2017    Social History  reports that she has quit smoking. Her smoking use included cigarettes. She has never used smokeless tobacco. She reports that she does not  currently use alcohol after a past usage of about 1.0 - 2.0 standard drink of alcohol per week. She reports that she does not use drugs.   Family History family history includes Cancer in her paternal grandfather; Depression in her father; Diabetes in her father, maternal grandmother, and paternal grandmother; Healthy in her brother and mother; Heart disease in her father; Hypertension in her father and another family member; Obesity in her brother; Sarcoidosis in her father.   PHYSICAL EXAM   Vitals:   05/23/23 1530 05/23/23 1552  BP: (!) 164/93 (!) 149/96  Pulse: (!) 123 (!) 107  Resp: 18   Temp: 98.7 F (37.1 C)   TempSrc: Oral   Weight: 93.4 kg   Height: 5\' 2"  (1.575 m)     Constitutional: No acute distress, well appearing, and well nourished. Neurologic: She is alert and conversational.  Psychiatric: She has a normal mood and affect.  Musculoskeletal: Normal gait, grossly normal range of motion. DTRs  1+ Cardiovascular: RRR Pulmonary/Chest: Normal work of breathing. CTAB. Gastrointestinal/Abdominal: Soft. Gravid. There is no tenderness.  Skin: Skin is warm and dry. No rash noted.  Genitourinary: Normal external female genitalia.   SVE:   Dilation: Closed Effacement (%): 30 Cervical Position: Posterior Station: Ballotable Presentation: Vertex Exam by:: Leannah Guse CNM   NST Interpretation  Baseline: 145 bpm Variability: moderate Accelerations: present Decelerations: none Contractions: irregular, mild Time noted:  See OBIX Impression: reactive Authenticated by: Lowella Dell, CNM

## 2023-05-24 ENCOUNTER — Other Ambulatory Visit: Payer: Self-pay

## 2023-05-24 ENCOUNTER — Inpatient Hospital Stay: Payer: Managed Care, Other (non HMO) | Admitting: Anesthesiology

## 2023-05-24 ENCOUNTER — Encounter: Payer: Self-pay | Admitting: Obstetrics and Gynecology

## 2023-05-24 ENCOUNTER — Encounter
Admission: EM | Disposition: A | Discharge status: Home / Self Care | Payer: Self-pay | Source: Home / Self Care | Attending: Obstetrics

## 2023-05-24 ENCOUNTER — Other Ambulatory Visit: Payer: Managed Care, Other (non HMO)

## 2023-05-24 DIAGNOSIS — O1494 Unspecified pre-eclampsia, complicating childbirth: Secondary | ICD-10-CM | POA: Diagnosis not present

## 2023-05-24 DIAGNOSIS — O368390 Maternal care for abnormalities of the fetal heart rate or rhythm, unspecified trimester, not applicable or unspecified: Secondary | ICD-10-CM | POA: Diagnosis present

## 2023-05-24 DIAGNOSIS — O2412 Pre-existing diabetes mellitus, type 2, in childbirth: Secondary | ICD-10-CM | POA: Diagnosis not present

## 2023-05-24 DIAGNOSIS — O99214 Obesity complicating childbirth: Secondary | ICD-10-CM

## 2023-05-24 DIAGNOSIS — Z794 Long term (current) use of insulin: Secondary | ICD-10-CM

## 2023-05-24 DIAGNOSIS — E669 Obesity, unspecified: Secondary | ICD-10-CM

## 2023-05-24 DIAGNOSIS — E119 Type 2 diabetes mellitus without complications: Secondary | ICD-10-CM

## 2023-05-24 LAB — GLUCOSE, CAPILLARY
Glucose-Capillary: 127 mg/dL — ABNORMAL HIGH (ref 70–99)
Glucose-Capillary: 167 mg/dL — ABNORMAL HIGH (ref 70–99)
Glucose-Capillary: 156 mg/dL — ABNORMAL HIGH (ref 70–99)
Glucose-Capillary: 137 mg/dL — ABNORMAL HIGH (ref 70–99)
Glucose-Capillary: 198 mg/dL — ABNORMAL HIGH (ref 70–99)
Glucose-Capillary: 184 mg/dL — ABNORMAL HIGH (ref 70–99)

## 2023-05-24 LAB — CBC
HCT: 34 % — ABNORMAL LOW (ref 36.0–46.0)
Hemoglobin: 11.5 g/dL — ABNORMAL LOW (ref 12.0–15.0)
MCH: 26.7 pg (ref 26.0–34.0)
MCHC: 33.8 g/dL (ref 30.0–36.0)
MCV: 79.1 fL — ABNORMAL LOW (ref 80.0–100.0)
Platelets: 312 10*3/uL (ref 150–400)
RBC: 4.3 MIL/uL (ref 3.87–5.11)
RDW: 16.4 % — ABNORMAL HIGH (ref 11.5–15.5)
WBC: 9.7 10*3/uL (ref 4.0–10.5)
nRBC: 0 % (ref 0.0–0.2)

## 2023-05-24 LAB — RPR: RPR Ser Ql: NONREACTIVE

## 2023-05-24 SURGERY — Surgical Case
Anesthesia: Epidural

## 2023-05-24 MED ORDER — MAGNESIUM HYDROXIDE 400 MG/5ML PO SUSP
30.0000 mL | ORAL | Status: DC | PRN
Start: 2023-05-24 — End: 2023-05-26

## 2023-05-24 MED ORDER — KETOROLAC TROMETHAMINE 30 MG/ML IJ SOLN
INTRAMUSCULAR | Status: AC
  Filled 2023-05-24: qty 1

## 2023-05-24 MED ORDER — MORPHINE SULFATE (PF) 0.5 MG/ML IJ SOLN
INTRAMUSCULAR | Status: DC | PRN
  Administered 2023-05-24: 3 mg via EPIDURAL

## 2023-05-24 MED ORDER — KETOROLAC TROMETHAMINE 30 MG/ML IJ SOLN
30.0000 mg | Freq: Four times a day (QID) | INTRAMUSCULAR | Status: AC
  Administered 2023-05-24 – 2023-05-25 (×4): 30 mg via INTRAVENOUS
  Filled 2023-05-24 (×4): qty 1

## 2023-05-24 MED ORDER — SODIUM CHLORIDE 0.9 % IR SOLN
Status: DC | PRN
  Administered 2023-05-24: 1000 mL

## 2023-05-24 MED ORDER — ACETAMINOPHEN 500 MG PO TABS
1000.0000 mg | ORAL_TABLET | Freq: Four times a day (QID) | ORAL | Status: DC
Start: 2023-05-24 — End: 2023-05-25
  Administered 2023-05-24: 1000 mg via ORAL
  Filled 2023-05-24: qty 2

## 2023-05-24 MED ORDER — SODIUM CHLORIDE 0.9 % IV SOLN
500.0000 mg | INTRAVENOUS | Status: AC
  Administered 2023-05-24: 500 mg via INTRAVENOUS
  Filled 2023-05-24: qty 5

## 2023-05-24 MED ORDER — CALCIUM CARBONATE ANTACID 500 MG PO CHEW: 2.0000 | CHEWABLE_TABLET | Freq: Three times a day (TID) | ORAL | Status: DC | PRN

## 2023-05-24 MED ORDER — LIDOCAINE-EPINEPHRINE (PF) 1.5 %-1:200000 IJ SOLN
INTRAMUSCULAR | Status: DC | PRN
  Administered 2023-05-24: 3 mL via EPIDURAL

## 2023-05-24 MED ORDER — LIDOCAINE HCL (PF) 2 % IJ SOLN: INTRAMUSCULAR | Status: DC | PRN

## 2023-05-24 MED ORDER — OXYTOCIN-SODIUM CHLORIDE 30-0.9 UT/500ML-% IV SOLN: 2.5000 [IU]/h | INTRAVENOUS | Status: AC

## 2023-05-24 MED ORDER — ONDANSETRON HCL 4 MG/2ML IJ SOLN
INTRAMUSCULAR | Status: DC | PRN
  Administered 2023-05-24: 4 mg via INTRAVENOUS

## 2023-05-24 MED ORDER — LIDOCAINE HCL (PF) 2 % IJ SOLN
INTRAMUSCULAR | Status: AC
Start: 2023-05-24 — End: ?
  Filled 2023-05-24: qty 20

## 2023-05-24 MED ORDER — SIMETHICONE 80 MG PO CHEW
80.0000 mg | CHEWABLE_TABLET | ORAL | Status: DC | PRN
  Administered 2023-05-26 (×2): 80 mg via ORAL
  Filled 2023-05-24: qty 1

## 2023-05-24 MED ORDER — LIDOCAINE HCL (PF) 2 % IJ SOLN
INTRAMUSCULAR | Status: DC | PRN
  Administered 2023-05-24: 10 mL via EPIDURAL
  Administered 2023-05-24: 5 mL via EPIDURAL

## 2023-05-24 MED ORDER — FENTANYL CITRATE (PF) 100 MCG/2ML IJ SOLN
INTRAMUSCULAR | Status: AC
  Filled 2023-05-24: qty 2

## 2023-05-24 MED ORDER — WITCH HAZEL-GLYCERIN EX PADS: 1.0000 | MEDICATED_PAD | CUTANEOUS | Status: DC | PRN

## 2023-05-24 MED ORDER — BUPIVACAINE HCL (PF) 0.25 % IJ SOLN
INTRAMUSCULAR | Status: DC | PRN
  Administered 2023-05-24: 8 mL via EPIDURAL

## 2023-05-24 MED ORDER — CEFAZOLIN SODIUM-DEXTROSE 2-4 GM/100ML-% IV SOLN
2.0000 g | INTRAVENOUS | Status: AC
  Administered 2023-05-24: 2 g via INTRAVENOUS
  Filled 2023-05-24: qty 100

## 2023-05-24 MED ORDER — MORPHINE SULFATE (PF) 0.5 MG/ML IJ SOLN
INTRAMUSCULAR | Status: AC
  Filled 2023-05-24: qty 10

## 2023-05-24 MED ORDER — PHENYLEPHRINE HCL-NACL 20-0.9 MG/250ML-% IV SOLN
INTRAVENOUS | Status: AC
  Filled 2023-05-24: qty 250

## 2023-05-24 MED ORDER — INSULIN GLARGINE-YFGN 100 UNIT/ML ~~LOC~~ SOLN
30.0000 [IU] | Freq: Every day | SUBCUTANEOUS | Status: DC
Start: 2023-05-24 — End: 2023-05-26
  Administered 2023-05-24 – 2023-05-26 (×3): 30 [IU] via SUBCUTANEOUS
  Filled 2023-05-24 (×4): qty 0.3

## 2023-05-24 MED ORDER — METFORMIN HCL ER 500 MG PO TB24
500.0000 mg | ORAL_TABLET | Freq: Two times a day (BID) | ORAL | Status: DC
  Administered 2023-05-24 – 2023-05-26 (×4): 500 mg via ORAL
  Filled 2023-05-24 (×5): qty 1

## 2023-05-24 MED ORDER — KETOROLAC TROMETHAMINE 30 MG/ML IJ SOLN: 30.0000 mg | Freq: Four times a day (QID) | INTRAMUSCULAR | Status: AC | PRN

## 2023-05-24 MED ORDER — NALOXONE HCL 0.4 MG/ML IJ SOLN: 0.4000 mg | INTRAMUSCULAR | Status: DC | PRN

## 2023-05-24 MED ORDER — INSULIN LISPRO (1 UNIT DIAL) 100 UNIT/ML (KWIKPEN): 100.0000 [IU] | PEN_INJECTOR | Freq: Three times a day (TID) | SUBCUTANEOUS | Status: DC

## 2023-05-24 MED ORDER — IBUPROFEN 600 MG PO TABS
600.0000 mg | ORAL_TABLET | Freq: Four times a day (QID) | ORAL | Status: DC
  Administered 2023-05-25 – 2023-05-26 (×5): 600 mg via ORAL
  Filled 2023-05-24 (×4): qty 1

## 2023-05-24 MED ORDER — SOD CITRATE-CITRIC ACID 500-334 MG/5ML PO SOLN
30.0000 mL | ORAL | Status: AC
  Administered 2023-05-24: 30 mL via ORAL

## 2023-05-24 MED ORDER — OXYTOCIN-SODIUM CHLORIDE 30-0.9 UT/500ML-% IV SOLN
1.0000 m[IU]/min | INTRAVENOUS | Status: DC
  Administered 2023-05-24: 250 [IU]/h via INTRAVENOUS

## 2023-05-24 MED ORDER — ACETAMINOPHEN 500 MG PO TABS
1000.0000 mg | ORAL_TABLET | Freq: Four times a day (QID) | ORAL | Status: DC
  Administered 2023-05-24: 1000 mg via ORAL
  Filled 2023-05-24: qty 2

## 2023-05-24 MED ORDER — SENNOSIDES-DOCUSATE SODIUM 8.6-50 MG PO TABS
2.0000 | ORAL_TABLET | Freq: Every day | ORAL | Status: DC
  Administered 2023-05-25 – 2023-05-26 (×2): 2 via ORAL
  Filled 2023-05-24 (×2): qty 2

## 2023-05-24 MED ORDER — INSULIN ASPART 100 UNIT/ML IJ SOLN
0.0000 [IU] | Freq: Three times a day (TID) | INTRAMUSCULAR | Status: DC
  Administered 2023-05-24: 3 [IU] via SUBCUTANEOUS
  Administered 2023-05-25: 2 [IU] via SUBCUTANEOUS
  Administered 2023-05-26: 3 [IU] via SUBCUTANEOUS
  Filled 2023-05-24 (×4): qty 1

## 2023-05-24 MED ORDER — PRENATAL MULTIVITAMIN CH
1.0000 | ORAL_TABLET | Freq: Every day | ORAL | Status: DC
  Administered 2023-05-24 – 2023-05-26 (×3): 1 via ORAL
  Filled 2023-05-24 (×3): qty 1

## 2023-05-24 MED ORDER — TERBUTALINE SULFATE 1 MG/ML IJ SOLN
0.2500 mg | Freq: Once | INTRAMUSCULAR | Status: AC | PRN
  Administered 2023-05-24: 0.25 mg via SUBCUTANEOUS

## 2023-05-24 MED ORDER — DIBUCAINE (PERIANAL) 1 % EX OINT: 1.0000 | TOPICAL_OINTMENT | CUTANEOUS | Status: DC | PRN

## 2023-05-24 MED ORDER — COCONUT OIL OIL: 1.0000 | TOPICAL_OIL | Status: DC | PRN

## 2023-05-24 MED ORDER — KETOROLAC TROMETHAMINE 30 MG/ML IJ SOLN
INTRAMUSCULAR | Status: DC | PRN
  Administered 2023-05-24: 30 mg via INTRAVENOUS

## 2023-05-24 MED ORDER — OXYCODONE HCL 5 MG PO TABS: 5.0000 mg | ORAL_TABLET | ORAL | Status: DC | PRN

## 2023-05-24 MED ORDER — NALOXONE HCL 4 MG/10ML IJ SOLN: 1.0000 ug/kg/h | INTRAVENOUS | Status: DC | PRN

## 2023-05-24 MED ORDER — SOD CITRATE-CITRIC ACID 500-334 MG/5ML PO SOLN
ORAL | Status: AC
  Filled 2023-05-24: qty 15

## 2023-05-24 MED ORDER — SIMETHICONE 80 MG PO CHEW
80.0000 mg | CHEWABLE_TABLET | Freq: Three times a day (TID) | ORAL | Status: DC
  Administered 2023-05-24 – 2023-05-26 (×5): 80 mg via ORAL
  Filled 2023-05-24 (×6): qty 1

## 2023-05-24 MED ORDER — MENTHOL 3 MG MT LOZG: 1.0000 | LOZENGE | OROMUCOSAL | Status: DC | PRN

## 2023-05-24 MED ORDER — LIDOCAINE 5 % EX PTCH
MEDICATED_PATCH | CUTANEOUS | Status: AC
  Filled 2023-05-24: qty 1

## 2023-05-24 MED ORDER — FENTANYL CITRATE (PF) 100 MCG/2ML IJ SOLN
INTRAMUSCULAR | Status: DC | PRN
Start: 2023-05-24 — End: 2023-05-24
  Administered 2023-05-24: 100 ug via EPIDURAL

## 2023-05-24 MED ORDER — DIPHENHYDRAMINE HCL 25 MG PO CAPS
25.0000 mg | ORAL_CAPSULE | Freq: Four times a day (QID) | ORAL | Status: DC | PRN
  Administered 2023-05-24: 25 mg via ORAL
  Filled 2023-05-24: qty 1

## 2023-05-24 MED ORDER — SODIUM CHLORIDE 0.9% FLUSH
3.0000 mL | INTRAVENOUS | Status: DC | PRN
Start: 2023-05-24 — End: 2023-05-26

## 2023-05-24 MED ORDER — SCOPOLAMINE 1 MG/3DAYS TD PT72: 1.0000 | MEDICATED_PATCH | Freq: Once | TRANSDERMAL | Status: DC

## 2023-05-24 MED ORDER — MEPERIDINE HCL 25 MG/ML IJ SOLN: 6.2500 mg | INTRAMUSCULAR | Status: DC | PRN

## 2023-05-24 MED ORDER — VARICELLA VIRUS VACCINE LIVE 1350 PFU/0.5ML IJ SUSR
0.5000 mL | Freq: Once | INTRAMUSCULAR | Status: AC
  Administered 2023-05-26: 0.5 mL via SUBCUTANEOUS
  Filled 2023-05-24 (×2): qty 0.5

## 2023-05-24 MED ORDER — OXYCODONE HCL 5 MG PO TABS
5.0000 mg | ORAL_TABLET | Freq: Four times a day (QID) | ORAL | Status: DC | PRN
  Administered 2023-05-26: 5 mg via ORAL
  Filled 2023-05-24: qty 1

## 2023-05-24 SURGICAL SUPPLY — 4 items
APL SKNCLS STERI-STRIP NONHPOA (GAUZE/BANDAGES/DRESSINGS) ×1
BENZOIN TINCTURE PRP APPL 2/3 (GAUZE/BANDAGES/DRESSINGS) IMPLANT
RETRACTOR WND ALEXIS-O 25 LRG (MISCELLANEOUS) IMPLANT
RTRCTR WOUND ALEXIS O 25CM LRG (MISCELLANEOUS) ×1

## 2023-05-24 NOTE — Progress Notes (Signed)
Labor Progress Note   ASSESSMENT/PLAN   Dawn Ortiz 32 y.o.   G1P0  at [redacted]w[redacted]d here for IOL for IDDM and preeclampsia.  FWB:  - Fetal well being assessed: Category 2        GBS: - GBS negative  LABOR: -  Latent labor, making cervical change - Recurrent decels after epidural placement. Treated with IV fluid bolus, position changes, terbutaline - Discussed options with Kelleen and IUPC placed. FSE placed but removed d/t equipment not functioning. - Will titrate Pitocin if needed for adequate contractions once FHR tracing Cat 1 - Discussed possibility of cesarean birth with Gearl and Johnnie  - BPs have been normotensive to mild range - Blood sugar 127 at 0324. Intiate EndoTool protocol - Pain Management: Epidural - Anticipate SVD   Labor Progress -1617 - misoprostol #1 -2032 - misoprostol #2. SVE 0/50/ballotable -0110 -SROM, clear. SVE 1/60/-3 -0451 - IUPC placed, SVE 3/30/-2  Principal Problem:   Labor and delivery, indication for care Active Problems:   Preeclampsia   SUBJECTIVE/OBJECTIVE   SUBJECTIVE:  Niylah is comfortable with her epidural    OBJECTIVE: Vital Signs: Patient Vitals for the past 12 hrs:  BP Temp Temp src Pulse SpO2  05/24/23 0455 -- -- -- -- 99 %  05/24/23 0450 -- -- -- -- 99 %  05/24/23 0445 -- -- -- -- 99 %  05/24/23 0440 -- -- -- -- 99 %  05/24/23 0437 (!) 141/72 -- -- 100 --  05/24/23 0435 -- -- -- -- 100 %  05/24/23 0430 -- -- -- -- 100 %  05/24/23 0425 -- -- -- -- 100 %  05/24/23 0422 120/74 -- -- 99 --  05/24/23 0420 -- -- -- -- 100 %  05/24/23 0415 -- -- -- -- 99 %  05/24/23 0410 -- -- -- -- 99 %  05/24/23 0405 -- -- -- -- 100 %  05/24/23 0400 -- -- -- -- 99 %  05/24/23 0354 -- -- -- -- 99 %  05/24/23 0350 -- -- -- -- 100 %  05/24/23 0345 -- -- -- -- 99 %  05/24/23 0341 120/72 -- -- (!) 122 --  05/24/23 0340 -- -- -- -- 100 %  05/24/23 0335 -- -- -- -- 99 %  05/24/23 0330 -- -- -- -- 100 %  05/24/23 0326 (!) 123/97 -- -- (!) 110  --  05/24/23 0325 -- -- -- -- 99 %  05/24/23 0320 -- -- -- -- 100 %  05/24/23 0315 -- -- -- -- 99 %  05/24/23 0312 127/81 -- -- (!) 117 --  05/24/23 0310 -- -- -- -- 100 %  05/24/23 0304 -- -- -- -- 100 %  05/24/23 0300 -- -- -- -- 99 %  05/24/23 0256 (!) 151/78 -- -- (!) 114 --  05/24/23 0255 -- -- -- -- 100 %  05/24/23 0254 (!) 152/93 -- -- (!) 116 --  05/24/23 0251 -- -- -- -- 100 %  05/24/23 0250 -- -- -- -- 100 %  05/24/23 0244 -- -- -- -- 100 %  05/24/23 0240 -- -- -- -- 100 %  05/24/23 0235 -- -- -- -- 100 %  05/24/23 0230 -- -- -- -- 100 %  05/24/23 0228 (!) 151/88 -- -- (!) 117 --  05/24/23 0226 (!) 151/88 -- -- (!) 117 --  05/24/23 0225 -- -- -- -- 100 %  05/24/23 0220 -- -- -- -- 100 %  05/24/23 0215 -- -- -- -- 100 %  05/24/23 0205 (!) 155/93 -- -- (!) 111 --  05/24/23 0144 (!) 154/86 -- -- (!) 108 --  05/24/23 0055 (!) 153/102 -- -- (!) 111 --  05/24/23 0025 (!) 151/96 -- -- (!) 107 --  05/23/23 2355 (!) 144/92 -- -- (!) 104 --  05/23/23 2330 (!) 144/95 -- -- (!) 103 --  05/23/23 2255 (!) 159/109 -- -- (!) 109 --  05/23/23 2225 (!) 158/99 -- -- (!) 104 --  05/23/23 2133 (!) 153/77 -- -- (!) 114 --  05/23/23 2103 (!) 154/92 -- -- (!) 105 --  05/23/23 2038 (!) 142/92 -- -- (!) 104 --  05/23/23 2003 (!) 144/89 -- -- (!) 112 --  05/23/23 1950 (!) 151/92 -- -- (!) 106 --  05/23/23 1934 (!) 164/98 -- -- (!) 108 --  05/23/23 1933 (!) 158/97 -- -- (!) 111 --  05/23/23 1902 -- 98.5 F (36.9 C) Oral -- --  05/23/23 1900 139/89 -- -- (!) 109 --  05/23/23 1829 134/88 -- -- (!) 101 --  05/23/23 1748 (!) 147/90 -- -- 98 --    Last SVE:  Dilation: 3 Effacement (%): 60 Cervical Position: Posterior Station: -2 Presentation: Vertex Exam by:: Quitman Livings, CNM -  , Rupture Date: 05/24/23, Rupture Time: 0110,    FHR:   - Mode: External  - Baseline Rate (A): 130 bpm (fht)  -  Moderate variability  - Characteristics (ie - accels, decels): Accelerations: 15 x 15,  recurrent lates   UTERINE ACTIVITY:   - Mode: Toco  - Contraction Frequency (min): 1-3 minutes  Glenetta Borg, CNM

## 2023-05-24 NOTE — Inpatient Diabetes Management (Addendum)
ADA Standards of Care 2023 Diabetes in Pregnancy Target Glucose Ranges:  Fasting: 70 - 95 mg/dL 1 hr postprandial:  161 - 140mg /dL (from first bite of meal) 2 hr postprandial:  100 - 120 mg/dL (from first bit of meal)     Latest Reference Range & Units 05/23/23 15:39 05/23/23 19:34 05/23/23 23:33 05/24/23 03:24 05/24/23 05:32 05/24/23 06:41  Glucose-Capillary 70 - 99 mg/dL 096 (H) 045 (H) 409 (H) 127 (H) 167 (H) 156 (H)  IV Insulin Drip Started  (H): Data is abnormally high    Admit with: [redacted]w[redacted]d for IOL    History: T2DM (insulin dependent)  Home DM Meds: Freestyle Libre 3 CGM        Humalog 20 units TID + SSI        Metformin 500 mg BID        Tresiba 65 units Daily  Current Orders: IV Insulin Drip    Note pt started on IV Insulin Drip this AM--Please leave on Drip for Delivery   Planning for C/S  After delivery, pt will likely need less insulin than she was taking during pregnancy   Once delivered: May consider initiating 50% reduction of pregnancy doses of Insulin  Semglee 30 units Daily (please give Semglee at least 1 hour prior to d/c of the IV Insulin Drip)  Novolog Sensitive Correction Scale/ SSI (0-9 units) TID AC + HS (Use Glycemic Control Order set)    --Will follow patient during hospitalization--  Ambrose Finland RN, MSN, CDCES Diabetes Coordinator Inpatient Glycemic Control Team Team Pager: [phone] (8a-5p)

## 2023-05-24 NOTE — Anesthesia Preprocedure Evaluation (Addendum)
Anesthesia Evaluation  Patient identified by MRN, date of birth, ID band Patient awake    Reviewed: Allergy & Precautions, NPO status , Patient's Chart, lab work & pertinent test results  History of Anesthesia Complications Negative for: history of anesthetic complications  Airway Mallampati: II  TM Distance: >3 FB Neck ROM: Full    Dental  (+) Chipped   Pulmonary neg pulmonary ROS, former smoker   Pulmonary exam normal breath sounds clear to auscultation       Cardiovascular Exercise Tolerance: Good hypertension, negative cardio ROS Normal cardiovascular exam Rhythm:Regular Rate:Normal     Neuro/Psych  PSYCHIATRIC DISORDERS (ADHD) Anxiety Depression    negative neurological ROS     GI/Hepatic negative GI ROS,GERD  ,,  Endo/Other  diabetes, Type 2, Insulin Dependent  Obesity   Renal/GU negative Renal ROS  negative genitourinary   Musculoskeletal   Abdominal   Peds  Hematology negative hematology ROS (+)   Anesthesia Other Findings Past Medical History: 07/02/2016: Abscess of left axilla No date: Abscess of left breast No date: Abscess of left thigh No date: Abscess of right breast No date: Abscess of right thigh 03/02/2019: Abscess of the breast and nipple No date: Anxiety 07/02/2016: Axillary abscess No date: Biliary colic No date: Diabetes mellitus without complication (HCC) No date: GERD (gastroesophageal reflux disease) 07/19/2019: Leukocytosis No date: Migraines 05/03/2023: Polyhydramnios in third trimester  Past Surgical History: No date: CHOLECYSTECTOMY 10/01/2019: ESOPHAGOGASTRODUODENOSCOPY (EGD) WITH PROPOFOL; N/A     Comment:  Procedure: ESOPHAGOGASTRODUODENOSCOPY (EGD) WITH               PROPOFOL;  Surgeon: Toney Reil, MD;  Location:               [facility];  Service: Gastroenterology;  Laterality:               N/A; 03/05/2019: I & D EXTREMITY; Left     Comment:  Procedure:  Irrigation and debridement;  Surgeon:               Henrene Dodge, MD;  Location: [facility];  Service:               General;  Laterality: Left; No date: INCISION AND DRAINAGE     Comment:  right breast x1 and left groin x1 and left axilla 07/02/2016: INCISION AND DRAINAGE ABSCESS; Left     Comment:  Procedure: INCISION AND DRAINAGE ABSCESS;  Surgeon:               Lattie Haw, MD;  Location: [facility];  Service:               General;  Laterality: Left; 04/18/2017: INCISION AND DRAINAGE ABSCESS; Left     Comment:  Procedure: INCISION AND DRAINAGE ABSCESS-LEFT GROIN;                Surgeon: Ancil Linsey, MD;  Location: [facility];                Service: General;  Laterality: Left; 05/21/2018: INCISION AND DRAINAGE ABSCESS; Right     Comment:  Procedure: INCISION AND DRAINAGE ABSCESS- RIGHT THIGH;                Surgeon: Henrene Dodge, MD;  Location: [facility];                Service: General;  Laterality: Right; 08/23/2020: INCISION AND DRAINAGE ABSCESS; Left     Comment:  Procedure: INCISION AND  DRAINAGE ABSCESS, axillary;                Surgeon: Henrene Dodge, MD;  Location: [facility];                Service: General;  Laterality: Left;  provider requesting              2 hours /120 minutes for procedure 04/09/2018: IRRIGATION AND DEBRIDEMENT ABSCESS; Right     Comment:  Procedure: IRRIGATION AND DEBRIDEMENT ABSCESS;  Surgeon:              Henrene Dodge, MD;  Location: [facility];  Service:               General;  Laterality: Right; 05/21/2018: IRRIGATION AND DEBRIDEMENT ABSCESS; Left     Comment:  Procedure: IRRIGATION AND DEBRIDEMENT BREAST ABSCESS;                Surgeon: Henrene Dodge, MD;  Location: [facility];                Service: General;  Laterality: Left; 01/06/2020: IRRIGATION AND DEBRIDEMENT ABSCESS; Right     Comment:  Procedure: MINOR INCISION AND DRAINAGE OF ABSCESS, right              axillary;  Surgeon: Campbell Lerner, MD;  Location: [facility]              ORS;   Service: General;  Laterality: Right; 12/03/2014: PILONIDAL CYST EXCISION     Comment:  Procedure: CYST EXCISION PILONIDAL EXTENSIVE;  Surgeon:               Duwaine Maxin, MD;  Location: [facility];  Service:               General;; No date: WISDOM TOOTH EXTRACTION     Comment:  fourl; sometime 2017  BMI    Body Mass Index: 37.68 kg/m      Reproductive/Obstetrics (+) Pregnancy                             Anesthesia Physical Anesthesia Plan  ASA: 3  Anesthesia Plan: Epidural   Post-op Pain Management:    Induction:   PONV Risk Score and Plan: 2 and Treatment may vary due to age or medical condition  Airway Management Planned: Natural Airway  Additional Equipment:   Intra-op Plan:   Post-operative Plan:   Informed Consent: I have reviewed the patients History and Physical, chart, labs and discussed the procedure including the risks, benefits and alternatives for the proposed anesthesia with the patient or authorized representative who has indicated his/her understanding and acceptance.     Dental Advisory Given  Plan Discussed with: Anesthesiologist  Anesthesia Plan Comments: (Patient reports no bleeding problems and no anticoagulant use.   Patient consented for risks of anesthesia including but not limited to:  - adverse reactions to medications - risk of bleeding, infection and or nerve damage from epidural that could lead to paralysis - risk of headache or failed epidural - nerve damage due to positioning - that if epidural is used for C-section that there is a chance of epidural failure requiring spinal placement or conversion to GA - damage to heart, brain, lungs, other parts of body or loss of life  Patient voiced understanding and assent.)       Anesthesia Quick Evaluation

## 2023-05-24 NOTE — Progress Notes (Signed)
Decision made to proceed with cesarean birth. Dawn Ortiz and Dawn Ortiz agree. Routines and procedure explained. Dr. Lonny Prude en route to hospital.  Glenetta Borg, CNM

## 2023-05-24 NOTE — Progress Notes (Signed)
Dr. Lonny Prude recommends continuing labor for now but move to cesarean if decelerations persist. Dawn Ortiz and Dawn Ortiz are in agreement with this plan.   Glenetta Borg, CNM

## 2023-05-24 NOTE — Progress Notes (Signed)
S: 31yo G1P0 at [redacted]w[redacted]d undergoing mIOL for T2DM and newly diagnosed preeclampsia on admission without severe features, by BPs and proteinuria. This morning, notified by CNM of recurrent lates. Received epidural approx 0300 with some lates closely following, then resolution, and return of lates. Is s/p Terbutaline and Pitocin has been off.   O:  GEN: A&O, NAD RESP: NWOB NEURO: No focal deficit  SVE: 3/70/-2 per CNM FHT: 130s / mod var / + accels / intermittent to recurrent lates Toco: Irregular Q1-65min Pitocin: off  A/P: Discussed with patient her fetal intolerance to labor and remote from delivery, recommend cesarean delivery and pt and husband in agreement. Informed consent obtained. -Orders in, Ancef and Azithromycin for pre-op -Move to OR when able  Peggye Form [facility]

## 2023-05-24 NOTE — Progress Notes (Signed)
Progress Note Called to bedside for prolonged decel to the 90s. Dawn Ortiz had been bolused and repositioned. HR had recovered by the time I entered the room. However, she continues to have late decels. Pitocin and Endotool have not yet been initiated. Cervical exam: 3.5/70/-2. I expressed concern to Dawn Ortiz and family about the baby's ability to tolerate labor and possible need for a cesarean birth. Consulted Dr. Lonny Prude to review strip and discuss recommendations.  Glenetta Borg, CNM

## 2023-05-24 NOTE — Lactation Note (Signed)
This note was copied from a baby's chart. Lactation Consultation Note  Patient Name: Dawn Ortiz [id number]'[id number] Date: 05/24/2023 Age:32 years Reason for consult: Follow-up assessment;Mother's request;Primapara;Early term 50-38.6wks   Maternal Data Patient requested to see lactation to help with a plan through the night.  Feeding goal is to exclusively breastfeed but mom does not mind putting infant to the breast.  Feeding Mother's Current Feeding Choice: Breast Milk and Donor Milk  Interventions Interventions: DEBP;Education;CDC Guidelines for Breast Pump Cleaning  LC set patient up with a DEBP and provided education on how to use the pump.  Currently, patient is using a 24mm flange but a pump observation will need to be done to make sure this is a good fit.    Patients plan is to give donor milk, and then pump. Then at next feeding session if she gets milk from previous pumped session she will give that milk to infant and supplement w/ donor milk.  Patient expressed that she was tired and at the beginning felt slightly overwhelmed but likes this plan for right now.   Consult Status Consult Status: Follow-up Follow-up type: In-patient    [name] Free 05/24/2023, 4:09 PM

## 2023-05-24 NOTE — Anesthesia Procedure Notes (Signed)
Epidural Patient location during procedure: OB Start time: 05/24/2023 2:34 AM End time: 05/24/2023 2:51 AM  Staffing Anesthesiologist: Reed Breech, MD Performed: anesthesiologist   Preanesthetic Checklist Completed: patient identified, IV checked, risks and benefits discussed, surgical consent, monitors and equipment checked, pre-op evaluation and timeout performed  Epidural Patient position: sitting Prep: Betadine Patient monitoring: heart rate, continuous pulse ox and blood pressure Approach: midline Location: L4-L5 Injection technique: LOR air  Needle:  Needle type: Tuohy  Needle gauge: 17 G Needle length: 9 cm Needle insertion depth: 7 cm Catheter at skin depth: 12 cm Test dose: negative and 1.5% lidocaine with Epi 1:200 K  Assessment Sensory level: T4  Additional Notes Two attempts.  Initial at L2-3 without any feedback and patient discomfort with needle.  Second at L4-5 with much improved tactile feedback.  Placed more leftward than expected with the patient's verbal directional help. No apparent complications.Reason for block:procedure for pain

## 2023-05-24 NOTE — Progress Notes (Signed)
Labor Progress Note   ASSESSMENT/PLAN   Amandalee J Eisaman 32 y.o.   G1P0  at [redacted]w[redacted]d here for IOL with IDDM and preeclampsia.  FWB:  - Fetal well being assessed: Category 1        GBS: - GBS negative  LABOR: -  Cervical ripening, coping well. - BPs have been mild range - Blood glucose was 103 at 2333 - Pain Management:  planning epidural - Contracting too frequently to repeat misoprostol at this time - IV fluid bolus, recheck in an hour - Anticipate SVD   Labor Progress   Principal Problem:   Labor and delivery, indication for care Active Problems:   Preeclampsia   SUBJECTIVE/OBJECTIVE   SUBJECTIVE:  Shelbe is feeling mild contractions. She denies HA, visual changes, and epigastric pain.   OBJECTIVE: Vital Signs: Patient Vitals for the past 12 hrs:  BP Temp Temp src Pulse Resp Height Weight  05/24/23 0025 (!) 151/96 -- -- (!) 107 -- -- --  05/23/23 2355 (!) 144/92 -- -- (!) 104 -- -- --  05/23/23 2330 (!) 144/95 -- -- (!) 103 -- -- --  05/23/23 2255 (!) 159/109 -- -- (!) 109 -- -- --  05/23/23 2225 (!) 158/99 -- -- (!) 104 -- -- --  05/23/23 2133 (!) 153/77 -- -- (!) 114 -- -- --  05/23/23 2103 (!) 154/92 -- -- (!) 105 -- -- --  05/23/23 2038 (!) 142/92 -- -- (!) 104 -- -- --  05/23/23 2003 (!) 144/89 -- -- (!) 112 -- -- --  05/23/23 1950 (!) 151/92 -- -- (!) 106 -- -- --  05/23/23 1934 (!) 164/98 -- -- (!) 108 -- -- --  05/23/23 1933 (!) 158/97 -- -- (!) 111 -- -- --  05/23/23 1902 -- 98.5 F (36.9 C) Oral -- -- -- --  05/23/23 1900 139/89 -- -- (!) 109 -- -- --  05/23/23 1829 134/88 -- -- (!) 101 -- -- --  05/23/23 1748 (!) 147/90 -- -- 98 -- -- --  05/23/23 1552 (!) 149/96 -- -- (!) 107 -- -- --  05/23/23 1530 (!) 164/93 98.7 F (37.1 C) Oral (!) 123 18 5\' 2"  (1.575 m) 93.4 kg    Last SVE:  Dilation: Closed Effacement (%): 50 Cervical Position: Posterior Station: Ballotable Presentation: Vertex Exam by:: Chryl Heck, CNM   FHR:   - Mode: External  -  Baseline Rate (A): 145 bpm  -  Moderate variability  - Characteristics (ie - accels, decels): Accelerations: 15 x 15  -  No decels  UTERINE ACTIVITY:   - Mode: Toco  - Contraction Frequency (min): 1-2 minutes  Glenetta Borg, CNM

## 2023-05-24 NOTE — Lactation Note (Signed)
This note was copied from a baby's chart. Lactation Consultation Note  Patient Name: Dawn Ortiz [id number] Date: 05/24/2023 Age:32 hours Reason for consult: L&D Initial assessment;Primapara;1st time breastfeeding;Early term 37-38.6wks;RN request   Maternal Data Has patient been taught Hand Expression?: No Does the patient have breastfeeding experience prior to this delivery?: No  Lactation to room for an initial assessment in L&D w/ a P1 patient and 1hr old baby Dawn.  Patient presents for IOL for insulin-dependent T2DM.  Infant was born at [redacted]w[redacted]d by c-section.  Patient stated that her feeding goal is primarily to pump but is ok with putting infant to breast now.  Patient has 2 pumps at home electric and a manual pump.    Feeding Mother's Current Feeding Choice: Breast Milk  LC put infant to the left breast in football hold.  Patient has short erect nipples, that when compressed go inward making it difficult for infant to latch.  Several initial attempts were made but infant has a hard time latching on.  LC introduced patient to a 20mm nipple shield.  Infant eventually latched on but was so to actively feed.  After a while infant started to feed and actively eat for only 10 minutes.     LATCH Score Latch: Repeated attempts needed to sustain latch, nipple held in mouth throughout feeding, stimulation needed to elicit sucking reflex.  Audible Swallowing: A few with stimulation  Type of Nipple: Everted at rest and after stimulation  Comfort (Breast/Nipple): Soft / non-tender (Nipple goes flat when compressed.)  Hold (Positioning): Full assist, staff holds infant at breast  LATCH Score: 6  Lactation Tools Discussed/Used Tools: Nipple Shields Nipple shield size: 20  Interventions Interventions: Breast feeding basics reviewed;Assisted with latch;Skin to skin;Breast compression;Adjust position;Support pillows;Position options;Education  LC provided education on the following;   milk production expectations, hunger cues,  benefits of STS and arousing infant for a feeding.  Lactation informed patient of feeding infant at least 8 or more times w/in a 24hr period but not exceeding 3hrs. Patient verbalized understanding.   LC will help set patient up with pumping once she is moved to the mother/baby unit.    Discharge Pump: DEBP;Hands Free;Personal Chiropractor, Mom Cozy) WIC Program: No  Consult Status Consult Status: Follow-up Follow-up type: In-patient    [name] Free 05/24/2023, 11:23 AM

## 2023-05-24 NOTE — Op Note (Signed)
CESAREAN SECTION OPERATIVE REPORT   DATE OF SURGERY: @DATE @  SURGEON: Dr. Julieanne Manson ASSISTANT:  Dr. Hildred Laser ANESTHESIA: Epidural  PROCEDURE: Primary low transverse cesarean section  PREOPERATIVE DIAGNOSES: 1. Intrauterine pregnancy at [redacted]w[redacted]d 2. Fetal intolerance of labor 3. Type 2 diabetes 4. New onset preeclampsia without severe features 5. BMI 37.6  POSTOPERATIVE DIAGNOSES: 1. Same, s/p pLTCS  QBL:   480cc DRAINS: foley catheter to gravity drainage, 100 ml of clear urine at end of the procedure TOTAL IV FLUIDS: IVF 700cc SPECIMENS: none COMPLICATIONS:  None  FINDINGS:  Viable female infant in cephalic presentation; APGARs 9/9; weight 3590 grams (7lbs, 15oz) Clear fluid at amniotomy Intact placenta with 3 vessel cord Uterus, tubes, and ovaries appeared normal  INDICATION and CONSENT: Dawn Ortiz is a 32 y.o. G1P1001 with IUP at [redacted]w[redacted]d undergoing induction of labor for T2DM and found to be preeclamptic on admission by Bps and proteinuria. Pt progressed to 3cm but throughout her labor, unable to augment with Pitocin due to fetal intolerance.  Cesarean delivery was recommended. The patient understood that the risks of cesarean section include, but are not limited to, visceral or vascular injury, infection, blood loss and need for transfusion, prolonged hospitalization, and reoperation.  The patient stated understanding and desired to proceed.  All questions were answered.  PROCEDURE:  After verbal and written informed consent was obtained, the patient was taken to the operating room where epidural anesthesia was found to be adequate.  SCDs were applied to the lower extremities and a Foley catheter was previously placed in the bladder under sterile technique.  The patient was placed in dorsal supine position with a leftward tilt, prepped and draped in a sterile fashion.  Two grams of Cefazolin and 500mg  of Azithromycin were given for infection prophylaxis.  Level of anesthesia  was confirmed to be adequate with Allis clamps.  A Pfannenstiel skin incision was made with the scalpel and carried down to the underlying layer of rectus fascia with the Bovie.  The fascia was nicked bilaterally in the midline with the Bovie and the fascial incision was extended laterally.  The superior aspect of the fascia was grasped with Kocher clamps and the underlying rectus muscles were dissected off sharply with the Bovie and bluntly.  In a similar fashion, the inferior aspect of fascia was grasped with Kocher clamps and the underlying rectus and pyramidalis were dissected off with the Bovie and bluntly.  The rectus muscles were separated in the midline bluntly.  The peritoneum was found to be free of adherent bowel and entered bluntly.  The peritoneal incision was extended bluntly to the bladder reflection with good visualization of the bladder.  The Alexis retractor was inserted and vesicouterine peritoneum identified.  Intraabdomnial survey revealed scant, clear peritoneal fluid and a thinned-out lower uterine segment.  The lower uterine segment was incised transversely with the scalpel.  The amniotic sac was ruptured simultaneously and clear fluid noted.  The uterine incision was extended bluntly in a cranial-caudal fashion.  The fetus was in cephalic presentation.  The head was flexed and elevated to the level of the uterine incision.  Gentle fundal pressure was applied by the assistant and the infant was delivered without difficulty. Infant had good tone and strong spontaneous cry. Delayed cord clamping was performed for 60 seconds.  The cord was doubly clamped and cut. The infant was handed to the awaiting NICU team.  The placenta delivered intact & spontaneously with manual massage of the uterine fundus. The  uterus was left in situ.  The inside of the uterus was gently wiped with lap sponges x 2 ensuring complete removal of placental membranes.  The uterine incision was repaired with a double  layer closure of 0-Vicryl first in a locking fashion, followed by 0-Vicryl in an imbricating stitch, with excellent hemostasis achieved.  The ovaries and tubes were found to be grossly normal.  Blood clots, debris and fluid were cleaned from the abdomen, gutters, and pelvis with moist laparotomy sponges.  The uterine incision was reinspected and was hemostatic.   The superior and inferior fascia were grasped with Kocher clamps and the rectus muscles were examined and found to be hemostatic, ensured with Bovie electrocautery.  The peritoneum was not closed and the rectus muscles were not reapproximated. The fascial layer was closed with 0-Vicryl in a running fashion.  The subcutaneous tissue was irrigated, made hemostatic with Bovie electrocautery, then reapproximated with a running layer of 3-0 Plain. The skin was closed subcuticularly with 4-0 Vicryl on a keith and steristrips and a sterile pressure dressing. All sponge, lap and instrument counts were correct x 2. The patient tolerated the procedure well and was taken to the recovery room in stable condition.  An experienced assistant was required given the standard of surgical care given the complexity of the case.  This assistant was needed for exposure, dissection, suctioning, retraction, instrument exchange, and for overall help during the procedure.   Julieanne Manson, DO [facility]

## 2023-05-24 NOTE — Transfer of Care (Signed)
Immediate Anesthesia Transfer of Care Note  Patient: Dawn Ortiz  Procedure(s) Performed: CESAREAN SECTION  Patient Location: Mother/Baby  Anesthesia Type:Spinal  Level of Consciousness: awake, alert , and oriented  Airway & Oxygen Therapy: Patient Spontanous Breathing  Post-op Assessment: Report given to RN and Post -op Vital signs reviewed and stable  Post vital signs: Reviewed and stable  Last Vitals:  Vitals Value Taken Time  BP    Temp    Pulse    Resp    SpO2      Last Pain:  Vitals:   05/24/23 0232  TempSrc:   PainSc: 7       Patients Stated Pain Goal: 0 (05/24/23 0232)  Complications: No notable events documented.

## 2023-05-25 LAB — GLUCOSE, CAPILLARY
Glucose-Capillary: 102 mg/dL — ABNORMAL HIGH (ref 70–99)
Glucose-Capillary: 107 mg/dL — ABNORMAL HIGH (ref 70–99)
Glucose-Capillary: 150 mg/dL — ABNORMAL HIGH (ref 70–99)
Glucose-Capillary: 128 mg/dL — ABNORMAL HIGH (ref 70–99)

## 2023-05-25 LAB — CBC
HCT: 32.1 % — ABNORMAL LOW (ref 36.0–46.0)
Hemoglobin: 10.8 g/dL — ABNORMAL LOW (ref 12.0–15.0)
MCH: 26.8 pg (ref 26.0–34.0)
MCHC: 33.6 g/dL (ref 30.0–36.0)
MCV: 79.7 fL — ABNORMAL LOW (ref 80.0–100.0)
Platelets: 295 10*3/uL (ref 150–400)
RBC: 4.03 MIL/uL (ref 3.87–5.11)
RDW: 16.6 % — ABNORMAL HIGH (ref 11.5–15.5)
WBC: 11.3 10*3/uL — ABNORMAL HIGH (ref 4.0–10.5)
nRBC: 0 % (ref 0.0–0.2)

## 2023-05-25 MED ORDER — IBUPROFEN 600 MG PO TABS: 600.0000 mg | ORAL_TABLET | Freq: Four times a day (QID) | ORAL | Status: DC

## 2023-05-25 MED ORDER — ACETAMINOPHEN 500 MG PO TABS
1000.0000 mg | ORAL_TABLET | Freq: Four times a day (QID) | ORAL | Status: DC
  Administered 2023-05-25 – 2023-05-26 (×6): 1000 mg via ORAL
  Filled 2023-05-25 (×7): qty 2

## 2023-05-25 MED ORDER — NIFEDIPINE ER OSMOTIC RELEASE 30 MG PO TB24
30.0000 mg | ORAL_TABLET | Freq: Every day | ORAL | Status: DC
  Administered 2023-05-25 – 2023-05-26 (×2): 30 mg via ORAL
  Filled 2023-05-25 (×2): qty 1

## 2023-05-25 NOTE — Lactation Note (Signed)
This note was copied from a baby's chart. Lactation Consultation Note  Patient Name: Dawn Ortiz [id number] Date: 05/25/2023 Age:32 hours Reason for consult: Follow-up assessment;Primapara;Early term 37-38.6wks   Maternal Data Does the patient have breastfeeding experience prior to this delivery?: No  Feeding Mother's Current Feeding Choice: Breast Milk and Formula Nipple Type: Slow - flow Mom has not breastfed today, plans on pumping and bottlefeeding EBM LATCH Score                    Lactation Tools Discussed/Used Tools: Pump Breast pump type: Double-Electric Breast Pump Reason for Pumping: plans to pump and bottlefeed EBM Pumping frequency: instructed q3h or 8x/24 hr Pumped volume:  (drops per mom) Last pumped overnight Interventions  LC name and no written on white board  Discharge Pump: DEBP;Personal  Consult Status Consult Status: PRN Date: 05/25/23 Follow-up type: In-patient    Dawn Ortiz 05/25/2023, 11:47 AM

## 2023-05-25 NOTE — Progress Notes (Signed)
Postpartum Day # 1: Cesarean Delivery (primary).  Indications: Fetal intolerance to labor.  Pregnancy complicated by Type II DM, obesity in pregnancy,  pre-eclampsia without severe features diagnosed on this admission.  Subjective: Patient reports tolerating PO, + flatus, and no problems voiding.  Is ambulating without difficulty. Notes bleeding is moderate. Pain is well controlled with PO medications. Is breastfeeding, s/p lactation consult.   Objective:  Vitals:   05/24/23 1922 05/24/23 2246 05/25/23 0342 05/25/23 0851  BP: (!) 155/96 (!) 143/91 133/78 (!) 136/97  Pulse: (!) 111 (!) 108 (!) 111 (!) 102  Resp: 18 18 18 20   Temp: 98.4 F (36.9 C) 98.7 F (37.1 C) 97.8 F (36.6 C) 98.2 F (36.8 C)  TempSrc: Oral Oral Oral Oral  SpO2: 100% 96% 98%   Weight:      Height:         Vital signs in last 24 hours: Temp:  [97.8 F (36.6 C)-98.7 F (37.1 C)] 98.2 F (36.8 C) (10/26 0851) Pulse Rate:  [95-199] 102 (10/26 0851) Resp:  [18-20] 20 (10/26 0851) BP: (128-160)/(78-113) 136/97 (10/26 0851) SpO2:  [96 %-100 %] 98 % (10/26 0342)     Physical Exam:  General: alert and no distress Lungs: clear to auscultation bilaterally Breasts: normal appearance, no masses or tenderness Heart: regular rate and rhythm, S1, S2 normal, no murmur, click, rub or gallop Abdomen: soft, appropriately tender at incision site; bowel sounds normal; no masses,  no organomegaly Pelvis: Lochia appropriate, Uterine Fundus firm, Incision: healing well, no significant drainage, no dehiscence, no significant erythema Extremities: DVT Evaluation: No evidence of DVT seen on physical exam. Negative Homan's sign. No cords or calf tenderness. No significant calf/ankle edema.      Latest Ref Rng & Units 05/25/2023    5:59 AM 05/24/2023    1:43 AM 05/23/2023    3:54 PM  CBC  WBC 4.0 - 10.5 K/uL 11.3  9.7  8.5   Hemoglobin 12.0 - 15.0 g/dL 40.9  81.1  91.4   Hematocrit 36.0 - 46.0 % 32.1  34.0  38.4    Platelets 150 - 400 K/uL 295  312  292         Latest Ref Rng & Units 05/23/2023    3:51 PM  CMP  Glucose 70 - 99 mg/dL 782   BUN 6 - 20 mg/dL 11   Creatinine 9.56 - 1.00 mg/dL 2.13   Sodium 086 - 578 mmol/L 134   Potassium 3.5 - 5.1 mmol/L 4.0   Chloride 98 - 111 mmol/L 105   CO2 22 - 32 mmol/L 19   Calcium 8.9 - 10.3 mg/dL 9.6   Total Protein 6.5 - 8.1 g/dL 7.8   Total Bilirubin 0.3 - 1.2 mg/dL 0.6   Alkaline Phos 38 - 126 U/L 176   AST 15 - 41 U/L 24   ALT 0 - 44 U/L 18     Glucose  Latest Reference Range & Units 05/24/23 07:56 05/24/23 16:52 05/24/23 21:07 05/25/23 08:34  Glucose-Capillary 70 - 99 mg/dL 469 (H) 629 (H) 528 (H) 102 (H)  (H): Data is abnormally high   Assessment/Plan: - Status post Cesarean section. Doing well postoperatively.  - Breastfeeding, is s/p Lactation consult - Circumcision prior to discharge - Contraception undecided - Regular diet as tolerated - Continue PO pain management - Continue q 4 daily accuchecks. Manage with Metformin 500 mg BID, and now on 30 of long-acting insulin in the evening. Also has SSI for meal coverage.  -  Pre-eclampsia without severe features, BP mild to moderate range. WIll initiate on Procardia 30 mg daily.  - Continue current care.  Dispo: Home in 1-2 days.    Hildred Laser, MD [facility] at [facility]

## 2023-05-25 NOTE — Anesthesia Postprocedure Evaluation (Signed)
Anesthesia Post Note  Patient: Dawn Ortiz  Procedure(s) Performed: CESAREAN SECTION  Patient location during evaluation: Mother Baby Anesthesia Type: Epidural Level of consciousness: awake and alert Pain management: pain level controlled Vital Signs Assessment: post-procedure vital signs reviewed and stable Respiratory status: spontaneous breathing, nonlabored ventilation and respiratory function stable Cardiovascular status: stable Postop Assessment: no headache, no backache and epidural receding Anesthetic complications: no   No notable events documented.   Last Vitals:  Vitals:   05/25/23 0342 05/25/23 0851  BP: 133/78 (!) 136/97  Pulse: (!) 111 (!) 102  Resp: 18 20  Temp: 36.6 C 36.8 C  SpO2: 98%     Last Pain:  Vitals:   05/25/23 0851  TempSrc: Oral  PainSc:                  Louie Boston

## 2023-05-26 LAB — GLUCOSE, CAPILLARY: Glucose-Capillary: 168 mg/dL — ABNORMAL HIGH (ref 70–99)

## 2023-05-26 MED ORDER — SIMETHICONE 80 MG PO CHEW: 80.0000 mg | CHEWABLE_TABLET | ORAL | Status: DC | PRN

## 2023-05-26 MED ORDER — OXYCODONE HCL 5 MG PO TABS: 5.0000 mg | ORAL_TABLET | Freq: Four times a day (QID) | ORAL | 0 refills | Status: DC | PRN

## 2023-05-26 MED ORDER — ACETAMINOPHEN 500 MG PO TABS: 1000.0000 mg | ORAL_TABLET | Freq: Four times a day (QID) | ORAL | Status: DC | PRN

## 2023-05-26 MED ORDER — NIFEDIPINE ER 30 MG PO TB24: 30.0000 mg | ORAL_TABLET | Freq: Every day | ORAL | 2 refills | Status: DC

## 2023-05-26 MED ORDER — INSULIN GLARGINE-YFGN 100 UNIT/ML ~~LOC~~ SOLN: 30.0000 [IU] | Freq: Every day | SUBCUTANEOUS | 11 refills | Status: DC

## 2023-05-26 MED ORDER — INSULIN ASPART 100 UNIT/ML IJ SOLN: 0.0000 [IU] | Freq: Three times a day (TID) | INTRAMUSCULAR | 11 refills | Status: DC

## 2023-05-26 MED ORDER — IBUPROFEN 600 MG PO TABS: 600.0000 mg | ORAL_TABLET | Freq: Four times a day (QID) | ORAL | Status: DC | PRN

## 2023-05-26 MED ORDER — METFORMIN HCL ER 500 MG PO TB24: 500.0000 mg | ORAL_TABLET | Freq: Two times a day (BID) | ORAL | 3 refills | Status: DC

## 2023-05-26 NOTE — Progress Notes (Signed)
Discharge instructions reviewed with patient and significant other.  Follow up care reviewed and questions answered. Printed copies given to patient for reference after discharge home.

## 2023-05-26 NOTE — Discharge Instructions (Signed)
Call the office to schedule an appointment for 7-10 days for incision check and 6 weeks postpartum visit. If you have questions call the office or the on call provider.  If you have urgent concerns you should go to the nearest emergency department for evaluation.

## 2023-05-26 NOTE — Lactation Note (Signed)
This note was copied from a baby's chart. Lactation Consultation Note  Patient Name: Dawn Ortiz [id number] Date: 05/26/2023 Age:32 hours Reason for consult: Follow-up assessment;Primapara;Early term 29-38.6wks   Maternal Data Patient stated that she hasn't been pumping through the night.  She was overstimulated yesterday and would prefer to just wait until she gets home to get back started.   Feeding Mother's Current Feeding Choice: Breast Milk and Formula  Interventions Interventions: Education;CDC Guidelines for Breast Pump Cleaning  Discharge Discharge Education: Engorgement and breast care;Outpatient recommendation  Education on engorgement prevention/treatment was discussed as well as breastmilk storage guidelines.  LC provided patient with a handout on breastmilk storage guidelines from [facility]. [facility] outpatient lactation services phone number written on the white board in the room.  Patient verbalized understanding   LC also provided a community support group to patient called [employer].   Consult Status Consult Status: Complete Follow-up type: Call as needed    [name] Free 05/26/2023, 10:44 AM

## 2023-05-29 ENCOUNTER — Other Ambulatory Visit: Payer: Managed Care, Other (non HMO)

## 2023-05-30 ENCOUNTER — Ambulatory Visit: Payer: Managed Care, Other (non HMO) | Admitting: Obstetrics

## 2023-05-30 ENCOUNTER — Encounter: Payer: Self-pay | Admitting: Obstetrics

## 2023-05-30 VITALS — BP 147/100 | HR 100 | Ht 62.0 in | Wt 185.0 lb

## 2023-05-30 DIAGNOSIS — Z4889 Encounter for other specified surgical aftercare: Secondary | ICD-10-CM

## 2023-05-30 NOTE — Progress Notes (Signed)
    OBSTETRICS/GYNECOLOGY POST-OPERATIVE CLINIC VISIT  Subjective:     Dawn Ortiz is a 32 y.o. G1 now P1001 who presents to the clinic 1 weeks status post  pCD at [redacted]w[redacted]d for  fetal intolerance of labor . Lochia light, is pain-controlled with Ibuprofen and Tylenol. Is having regular bowel movements and voiding without difficulty. Has some soreness on the right side of her incision when standing more, resolves with sitting. She is very sleep-deprived and feeling some baby blues, but denies SI/HI and is able to sleep when she can, not overly anxious.   Pt's pregnancy also notable for T2DM, has resumed her pre-pregnancy insulin/Metformin regimen and reports BG doing well in normal range. Also had preeclampsia without severe features diagnosed on admission.  Pt is taking Procardia 30mg  daily, denies HA, vision changes, RUQ pain. Did forget to take her Procardia yesterday and has not taken it yet today either.   The following portions of the patient's history were reviewed and updated as appropriate: allergies, current medications, past family history, past medical history, past social history, past surgical history, and problem list.  Review of Systems Pertinent items are noted in HPI.   Objective:   BP (!) 147/100   Pulse 100   Ht 5\' 2"  (1.575 m)   Wt 185 lb (83.9 kg)   Breastfeeding Yes   BMI 33.84 kg/m  Body mass index is 33.84 kg/m.  General:  alert and no distress  Abdomen: soft, bowel sounds active, non-tender  Incision:   healing well, no drainage, no erythema, no hernia, no seroma, no swelling, well approximated, no dehiscence, incision well approximated    Pathology: N/A   Assessment:  32yo G1P1001 s/p pLTCS at [redacted]w[redacted]d for fetal intolerance of labor, POD#6. Doing well postoperatively. -Preeclampsia without severe features: on Procardia 30mg  daily with mild elevations today, but patient hasn't taken her medication for the past 2 days.   -T2DM: stable Plan:  -No concerns  with incision today, honeycomb removed, new steristrips placed.  Discussed at-home care, healing expectations, and si/sx of infection. Call clinic if developing redness, discharge, increasing pain. -Avoid vigorous scrubbing/washing of incision site; hygiene reviewed -May resume driving and light walking. Still no heavy lifting > 10lbs and pelvic rest until cleared at PPV. -If stooling regularly x 1-2 weeks, can take stool softener QOD x 1 week and taper as tolerated.  -Recommend postpartum belly band for pt's soreness when standing.  -Continue Procardia regularly, notify clinic if BPs >160/110 with taking the medicine.  -RTC 5wks for PPV, sooner prn   Julieanne Manson, DO [facility]

## 2023-05-31 ENCOUNTER — Other Ambulatory Visit: Payer: Managed Care, Other (non HMO)

## 2023-06-07 ENCOUNTER — Other Ambulatory Visit: Payer: Managed Care, Other (non HMO)

## 2023-07-02 NOTE — Progress Notes (Unsigned)
   OBSTETRICS POSTPARTUM CLINIC PROGRESS NOTE  Subjective:     Dawn Ortiz is a 32 y.o. G53P1001 female who presents for a postpartum visit. She is 6 weeks postpartum following a {delivery:12449}. I have reviewed the prenatal and intrapartum course. The delivery was at [redacted]w[redacted]d gestational weeks.  Anesthesia: spinal. Postpartum course has been ***. Baby's course has been ***. Baby is feeding by {breast/bottle:69}. Bleeding: patient {HAS HAS WUJ:81191} not resumed menses, with No LMP recorded.. Bowel function is {normal:32111}. Bladder function is {normal:32111}. Patient {is/is not:9024} sexually active. Contraception method desired is {contraceptive method:5051}. Postpartum depression screening: {neg default:13464::"negative"}.  EDPS score is ***.    The following portions of the patient's history were reviewed and updated as appropriate: allergies, current medications, past family history, past medical history, past social history, past surgical history, and problem list.  Review of Systems {ros; complete:30496}   Objective:    There were no vitals taken for this visit.  General:  alert and no distress   Breasts:  inspection negative, no nipple discharge or bleeding, no masses or nodularity palpable  Lungs: clear to auscultation bilaterally  Heart:  regular rate and rhythm, S1, S2 normal, no murmur, click, rub or gallop  Abdomen: soft, non-tender; bowel sounds normal; no masses,  no organomegaly.  ***Well healed Pfannenstiel incision   Vulva:  normal  Vagina: normal vagina, no discharge, exudate, lesion, or erythema  Cervix:  no cervical motion tenderness and no lesions  Corpus: normal size, contour, position, consistency, mobility, non-tender  Adnexa:  normal adnexa and no mass, fullness, tenderness  Rectal Exam: Not performed.         Labs:  Lab Results  Component Value Date   HGB 10.8 (L) 05/25/2023     Assessment:   No diagnosis found.   Plan:    1. Contraception:  {method:5051} 2. Will check Hgb for h/o postpartum anemia of less than 10.  3. Follow up in: {1-10:13787} {time; units:19136} or as needed.    Cornelius Moras, CMA [facility]

## 2023-07-03 ENCOUNTER — Encounter: Payer: Self-pay | Admitting: Obstetrics

## 2023-07-03 ENCOUNTER — Ambulatory Visit (INDEPENDENT_AMBULATORY_CARE_PROVIDER_SITE_OTHER): Payer: Managed Care, Other (non HMO) | Admitting: Obstetrics

## 2023-07-03 DIAGNOSIS — Z3009 Encounter for other general counseling and advice on contraception: Secondary | ICD-10-CM

## 2023-07-03 MED ORDER — NORELGESTROMIN-ETH ESTRADIOL 150-35 MCG/24HR TD PTWK: 1.0000 | MEDICATED_PATCH | TRANSDERMAL | 3 refills | Status: DC

## 2023-07-03 NOTE — Addendum Note (Signed)
Addended by: Julieanne Manson on: 07/03/2023 11:21 AM   Modules accepted: Orders

## 2023-07-03 NOTE — Patient Instructions (Signed)
[email or web address]

## 2023-07-09 ENCOUNTER — Ambulatory Visit: Payer: Managed Care, Other (non HMO) | Admitting: Obstetrics

## 2023-07-09 ENCOUNTER — Encounter: Payer: Self-pay | Admitting: Obstetrics

## 2023-07-09 VITALS — BP 119/72 | HR 111 | Ht 62.0 in | Wt 185.0 lb

## 2023-07-09 DIAGNOSIS — Z30430 Encounter for insertion of intrauterine contraceptive device: Secondary | ICD-10-CM | POA: Diagnosis not present

## 2023-07-09 DIAGNOSIS — Z3202 Encounter for pregnancy test, result negative: Secondary | ICD-10-CM | POA: Diagnosis not present

## 2023-07-09 MED ORDER — LEVONORGESTREL 20 MCG/DAY IU IUD
1.0000 | INTRAUTERINE_SYSTEM | Freq: Once | INTRAUTERINE | Status: AC
  Administered 2023-07-09: 1 via INTRAUTERINE

## 2023-07-09 NOTE — Progress Notes (Unsigned)
    GYNECOLOGY OFFICE PROCEDURE NOTE  Chandelle AMBRIANNA BENTHAM is a 32 y.o. G1P1001 here for mirena IUD insertion for contraception and menses control. No GYN concerns.  Last pap smear was on 11/28/22 and was normal.  IUD Insertion Procedure Note Patient identified, informed consent performed, consent signed.   Discussed risks of irregular bleeding, cramping, infection, malpositioning or misplacement of the IUD outside the uterus which may require further procedure such as laparoscopy. Also discussed >99% contraception efficacy, increased risk of ectopic pregnancy with failure of method.   Emphasized that this did not protect against STIs, condoms recommended during all sexual encounters. Time out was performed.  Urine pregnancy test negative.  Speculum placed in the vagina.  Cervix visualized.  Cleaned with Betadine x 2.  Grasped anteriorly with a single tooth tenaculum.  Uterus sounded to 8 cm.  Mirena IUD placed per manufacturer's recommendations.  Strings trimmed to 3 cm. Tenaculum was removed, good hemostasis noted.  Patient tolerated procedure well.   Patient was given post-procedure instructions.  She was advised to have backup contraception for one week.  Patient was also asked to check IUD strings periodically.     Julieanne Manson DO [facility] at [facility]

## 2023-07-09 NOTE — Patient Instructions (Signed)
   IUD AFTERCARE INSTRUCTIONS  Today you may go back to school or work after your visit. You must wait 24 hours after your IUD is put in before you can use tampons, take a bath, or have vaginal sex.  You may have more cramps or heavier bleeding with your periods, or spotting between your periods. This is normal. The cramping and bleeding can last for 3-6 months with the Mirena and Palau IUDs. After 6 months, the cramping and bleeding should get better. Many women will stop having periods after 1 or 2 years with the [place] and [place] IUDs. If you have the Paragard (copper) IUD, you may have more cramping and more bleeding with your periods as long as you have the IUD inside you.  Ibuprofen helps decrease the bleeding and cramping. You can take as many as 4 pills (800 mg) of Ibuprofen every 8 hours with food (each pill contains 200 mg).  Your IUD may come out by itself in the first three months. If you can feel the strings, the IUD is in the right place. If your IUD comes out, you can become pregnant immediately. If you are not sure how to check the strings, we can help you. Meanwhile, use condoms.  Your IUD does not protect against sexually transmitted infections including the HIV virus, genital warts (HPV), gonorrhea, chlamydia, trichomonas, syphilis and herpes. Condoms should be used to decrease the risk of sexually transmitted infections. If you think that you have been exposed to a sexually transmitted infection, please call the clinic. Most infections can be treated WITHOUT removing your IUD.  If you had your IUD placed for birth control, it is effective immediately if it was inserted within five days after the start of your period. If you have it inserted at any other time during your menstrual cycle, use another method of birth control, like condoms for at least 7 days.  Warning Signs Call the clinic if any of the following occurs:  You have fever (over 101F ) or chills.  The implant  comes out or you have concerns about its location.  You have a positive pregnancy test or suspect you might be pregnant.

## 2023-07-10 LAB — POCT URINE PREGNANCY: Preg Test, Ur: NEGATIVE

## 2023-07-11 ENCOUNTER — Ambulatory Visit: Payer: Managed Care, Other (non HMO) | Admitting: Dermatology

## 2023-07-12 ENCOUNTER — Encounter

## 2023-07-14 MED ORDER — TIZANIDINE HCL 2 MG PO TABS
2 MG | ORAL_TABLET | ORAL | 1 refills | Status: AC
Start: 2023-07-14 — End: 2023-08-30

## 2023-07-23 ENCOUNTER — Encounter: Payer: Self-pay | Admitting: Obstetrics

## 2023-08-06 ENCOUNTER — Ambulatory Visit: Payer: Managed Care, Other (non HMO) | Admitting: Obstetrics

## 2023-08-06 ENCOUNTER — Encounter: Payer: Self-pay | Admitting: Obstetrics

## 2023-08-06 VITALS — BP 136/81 | HR 109 | Ht 62.0 in | Wt 186.0 lb

## 2023-08-06 DIAGNOSIS — Z30431 Encounter for routine checking of intrauterine contraceptive device: Secondary | ICD-10-CM | POA: Diagnosis not present

## 2023-08-06 NOTE — Progress Notes (Signed)
    GYNECOLOGY OFFICE ENCOUNTER NOTE  History:  33 y.o. G1P1001 here today for today for IUD string check; Mirena   IUD was placed 07/09/23. No complaints about the IUD, no concerning side effects.  The following portions of the patient's history were reviewed and updated as appropriate: allergies, current medications, past family history, past medical history, past social history, past surgical history and problem list. Last pap smear on 11/28/22 was normal, negative HRHPV.  Review of Systems:  Pertinent items are noted in HPI.  Objective:  Blood pressure 136/81, pulse (!) 109, height 5' 2 (1.575 m), weight 186 lb (84.4 kg), not currently breastfeeding.  Physical Exam CONSTITUTIONAL: Well-developed, well-nourished female in no acute distress.  NEUROLOGIC: Alert and oriented to person, place, and time. Normal reflexes, muscle tone coordination.  ABDOMEN: Soft, no distention noted.   PELVIC: Normal appearing external genitalia; normal appearing vaginal mucosa and cervix.  IUD strings visualized, about 2 cm in length outside cervix. Done in the presence of a chaperone.  EXTREMITIES: Non-tender, no edema or cyanosis  Assessment & Plan:  Patient to keep Mirena  IUD in place for up to 8 years; can come in for removal if she desires pregnancy earlier or for any concerning side effects. RTC in 5 mos for annual, sooner prn.    Estil Mangle, DO [facility]

## 2023-08-13 ENCOUNTER — Ambulatory Visit: Payer: Managed Care, Other (non HMO) | Admitting: Dermatology

## 2023-08-30 ENCOUNTER — Telehealth
Admit: 2023-08-30 | Discharge: 2023-08-30 | Payer: PRIVATE HEALTH INSURANCE | Attending: Family Medicine | Primary: Family Medicine

## 2023-08-30 DIAGNOSIS — J101 Influenza due to other identified influenza virus with other respiratory manifestations: Secondary | ICD-10-CM

## 2023-08-30 MED ORDER — ALBUTEROL SULFATE HFA 108 (90 BASE) MCG/ACT IN AERS
10890 | Freq: Four times a day (QID) | RESPIRATORY_TRACT | 0 refills | Status: AC | PRN
Start: 2023-08-30 — End: ?

## 2023-08-30 MED ORDER — BENZONATATE 100 MG PO CAPS
100 | ORAL_CAPSULE | Freq: Three times a day (TID) | ORAL | 0 refills | Status: AC | PRN
Start: 2023-08-30 — End: 2023-09-09

## 2023-08-30 MED ORDER — PREDNISONE 20 MG PO TABS
20 | ORAL_TABLET | Freq: Every day | ORAL | 0 refills | Status: AC
Start: 2023-08-30 — End: 2023-09-04

## 2023-08-30 NOTE — Progress Notes (Signed)
 Heidi Combs, was evaluated through a synchronous (real-time) audio-video encounter. The patient (or guardian if applicable) is aware that this is a billable service, which includes applicable co-pays. This Virtual Visit was conducted with patient's (and/or legal guardian's) consent. Patient identification was verified, and a caregiver was present when appropriate.   The patient was located at Home: [address]  [place] [address]  Provider was located at [employer] (Appt Dept): [address] [place],  [place] [address]  Confirm you are appropriately licensed, registered, or certified to deliver care in the state where the patient is located as indicated above. If you are not or unsure, please re-schedule the visit: Yes, I confirm.     Heidi Combs (DOB:  [birth year]) is a Established patient, presenting virtually for evaluation of the following:      CHIEF COMPLAINT:  Chief Complaint   Patient presents with    Influenza     Positive on 1/28 with an at home test. S/s of body aches, headache, severe congestion especially in chest, loss of appetite, fever, developed on 1/26.         HISTORY OF PRESENT ILLNESS:  Heidi Combs is a 33 y.o. female  who presents to discuss influenza.  Patient was exposed to flu recently.  Patient has been sick for about 5 days and has had a combination of fever, chills, body aches, coughing, congestion.  Systemic symptoms have improved but she still feels tired and is coughing with a lot of congestion.    PHQ:      08/30/2023     7:58 AM   PHQ-9    Little interest or pleasure in doing things 0   Feeling down, depressed, or hopeless 0   PHQ-2 Score 0   PHQ-9 Total Score 0       CURRENT MEDICATION LIST:    Current Outpatient Medications   Medication Sig Dispense Refill    predniSONE (DELTASONE) 20 MG tablet Take 1 tablet by mouth daily for 5 days 5 tablet 0    albuterol sulfate HFA (VENTOLIN HFA) 108 (90 Base) MCG/ACT inhaler Inhale 2 puffs  into the lungs 4 times daily as needed for Wheezing 1 each 0    benzonatate (TESSALON) 100 MG capsule Take 1 capsule by mouth 3 times daily as needed for Cough 30 capsule 0    escitalopram (LEXAPRO) 20 MG tablet 1 tablet Orally Once a day for 30 day(s)       No current facility-administered medications for this visit.        ALLERGIES:    Allergies   Allergen Reactions    Cefaclor Hives and Other (See Comments)     Other reaction(s): Unknown    Sumatriptan Swelling     Other reaction(s): shortness of breath  angioedema          HISTORY:  Past Medical History:   Diagnosis Date    ADHD (attention deficit hyperactivity disorder)     Anxiety     Depression     GERD (gastroesophageal reflux disease)       Past Surgical History:   Procedure Laterality Date    APPENDECTOMY      TONSILLECTOMY        Social History     Socioeconomic History    Marital status: Unknown     Spouse name: Not on file    Number of children: Not on file    Years of education: Not on file  Highest education level: Not on file   Occupational History    Not on file   Tobacco Use    Smoking status: Never    Smokeless tobacco: Never    Tobacco comments:     08/03/2022   Substance and Sexual Activity    Alcohol use: Never    Drug use: Never    Sexual activity: Yes     Partners: Male   Other Topics Concern    Not on file   Social History Narrative    Not on file     Social Determinants of Health     Financial Resource Strain: Not on file   Food Insecurity: Not on file   Transportation Needs: Not on file   Physical Activity: Not on file   Stress: Not on file   Social Connections: Not on file   Intimate Partner Violence: Not on file   Housing Stability: Not on file      Family History   Problem Relation Age of Onset    Depression Mother     Alcohol Abuse Father     Cancer Father     Diabetes Father     Early Death Father     High Blood Pressure Father     Colon Cancer Maternal Grandfather     Heart Disease Maternal Grandfather     High Cholesterol  Maternal Grandfather     Mental Illness Maternal Aunt     Substance Abuse Brother         REVIEW OF SYSTEMS:  Per HPI. Otherwise balance of 10 point ROS is negative.     PHYSICAL EXAM:  Vital Signs -   There were no vitals taken for this visit.     GENERAL APPEARANCE: no acute distress  PSYCH: alert and oriented, cognitive function intact, cooperative with exam, good eye contact, judgment and insight good, mood/affect appropriate, no auditory or visual hallucination, speech clear, thought process logical and goal directed, thought content without suicidal ideation or delusions        LABS  No results found for this visit on 08/30/23.  Office Visit on 05/07/2023   Component Date Value Ref Range Status    SARS-CoV-2 05/07/2023 Not Detected  Not Detected Final    Comment: Test performed at [facility] [address], [place], [place] [address]  The cobas SARS-CoV-2 & Influenza A/B Nucleic acid test for use on the cobas  Liat System (cobas SARS-CoV-2 & Influenza A/B) is an automated multiplex  real-time RT-PCR assay intended for the simultaneous rapid in vitro  qualitative detection and differentiation of SARS-CoV-2, influenza A, and  influenza B virus RNA in healthcare provider-collected nasopharyngeal and  nasal swabs, from individuals suspected of respiratory viral infection  consistent with COVID-19 by their healthcare provider.  Cobas SARS-CoV-2 & Influenza A/B is intended for use in the simultaneous rapid  in vitro detection and differentiation of SARS-CoV-2, influenza A virus, and  influenza B virus nucleic acids in clinical specimens and is not intended to  detect influenza C virus. SARS-CoV-2, influenza A and influenza B viral RNA is  generally detectable in respiratory specimens during the acute phase of  infec                           tion. Positive results are indicative of active infection but do not rule  out bacterial infection or co-infection with other pathogens not  detected by  the test. Clinical correlation with patient history and other diagnostic  information is necessary to determine patient infection status. The agent  detected may not be the definite cause of disease.  Negative results do not preclude SARS-CoV-2, influenza A and/or influenza B  infection and should not be used as the sole basis for diagnosis, treatment or  other patient management decisions. Negative results must be combined with  clinical observations, patient history, and/or epidemiological information.  The cobas SARS-CoV-2 & Influenza A/B Nucleic acid test is only for use under  the Food and Drug Administration's Emergency Use Authorization.      Influenza A Antigen, POC 05/07/2023 Negative  Not Detected Final    Influenza B Antigen, POC 05/07/2023 Negative  Not Detected Final    Valid Internal Control, POC 05/07/2023 PASS   Final    Valid Internal Control, POC 05/07/2023 PASS   Final    Group A Strep Antigen, POC 05/07/2023 Negative   Final       IMPRESSION/PLAN    1. Influenza A  -     predniSONE (DELTASONE) 20 MG tablet; Take 1 tablet by mouth daily for 5 days, Disp-5 tablet, R-0Normal  -     albuterol sulfate HFA (VENTOLIN HFA) 108 (90 Base) MCG/ACT inhaler; Inhale 2 puffs into the lungs 4 times daily as needed for Wheezing, Disp-1 each, R-0Normal  -     benzonatate (TESSALON) 100 MG capsule; Take 1 capsule by mouth 3 times daily as needed for Cough, Disp-30 capsule, R-0Normal     New problem.  Probably too late for Tamiflu to be effective.  Will treat symptomatically.  If not improving, she needs to be    Follow up and Dispositions:  No follow-ups on file.       Oda Cogan, MD

## 2023-09-30 ENCOUNTER — Encounter: Payer: Self-pay | Admitting: Family Medicine

## 2023-09-30 ENCOUNTER — Ambulatory Visit: Payer: Managed Care, Other (non HMO) | Admitting: Family Medicine

## 2023-09-30 VITALS — BP 122/87 | HR 89 | Ht 62.0 in | Wt 181.6 lb

## 2023-09-30 DIAGNOSIS — E66811 Obesity, class 1: Secondary | ICD-10-CM

## 2023-09-30 DIAGNOSIS — F411 Generalized anxiety disorder: Secondary | ICD-10-CM

## 2023-09-30 DIAGNOSIS — F900 Attention-deficit hyperactivity disorder, predominantly inattentive type: Secondary | ICD-10-CM | POA: Diagnosis not present

## 2023-09-30 DIAGNOSIS — R079 Chest pain, unspecified: Secondary | ICD-10-CM | POA: Diagnosis not present

## 2023-09-30 DIAGNOSIS — Z794 Long term (current) use of insulin: Secondary | ICD-10-CM

## 2023-09-30 DIAGNOSIS — Z7689 Persons encountering health services in other specified circumstances: Secondary | ICD-10-CM

## 2023-09-30 DIAGNOSIS — E1165 Type 2 diabetes mellitus with hyperglycemia: Secondary | ICD-10-CM

## 2023-09-30 MED ORDER — LORAZEPAM 0.5 MG PO TABS: 0.5000 mg | ORAL_TABLET | Freq: Two times a day (BID) | ORAL | 0 refills | Status: DC | PRN

## 2023-09-30 MED ORDER — AMPHETAMINE-DEXTROAMPHETAMINE 10 MG PO TABS: 10.0000 mg | ORAL_TABLET | Freq: Every day | ORAL | 0 refills | Status: DC

## 2023-09-30 MED ORDER — SEMAGLUTIDE (1 MG/DOSE) 4 MG/3ML ~~LOC~~ SOPN: 1.0000 mg | PEN_INJECTOR | SUBCUTANEOUS | 2 refills | Status: DC

## 2023-09-30 MED ORDER — AMPHETAMINE-DEXTROAMPHET ER 20 MG PO CP24: 20.0000 mg | ORAL_CAPSULE | ORAL | 0 refills | Status: DC
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
## 2023-10-07 ENCOUNTER — Encounter: Payer: Self-pay | Admitting: Family Medicine

## 2023-10-31 ENCOUNTER — Ambulatory Visit (INDEPENDENT_AMBULATORY_CARE_PROVIDER_SITE_OTHER): Admitting: Family Medicine

## 2023-10-31 ENCOUNTER — Encounter: Payer: Self-pay | Admitting: Family Medicine

## 2023-10-31 VITALS — BP 139/89 | HR 103 | Ht 62.0 in | Wt 175.2 lb

## 2023-10-31 DIAGNOSIS — N939 Abnormal uterine and vaginal bleeding, unspecified: Secondary | ICD-10-CM | POA: Diagnosis not present

## 2023-10-31 DIAGNOSIS — Z8639 Personal history of other endocrine, nutritional and metabolic disease: Secondary | ICD-10-CM

## 2023-10-31 DIAGNOSIS — Z794 Long term (current) use of insulin: Secondary | ICD-10-CM

## 2023-10-31 DIAGNOSIS — M654 Radial styloid tenosynovitis [de Quervain]: Secondary | ICD-10-CM

## 2023-10-31 DIAGNOSIS — E1165 Type 2 diabetes mellitus with hyperglycemia: Secondary | ICD-10-CM

## 2023-10-31 LAB — POCT GLYCOSYLATED HEMOGLOBIN (HGB A1C)
HbA1c POC (<> result, manual entry): 9.1 % (ref 4.0–5.6)
HbA1c, POC (controlled diabetic range): 9.1 % — AB (ref 0.0–7.0)
HbA1c, POC (prediabetic range): 9.1 % — AB (ref 5.7–6.4)
Hemoglobin A1C: 9.1 % — AB (ref 4.0–5.6)

## 2023-10-31 MED ORDER — MELOXICAM 15 MG PO TABS: 15.0000 mg | ORAL_TABLET | Freq: Every day | ORAL | 0 refills | Status: DC

## 2023-10-31 MED ORDER — SEMAGLUTIDE (2 MG/DOSE) 8 MG/3ML ~~LOC~~ SOPN: 2.0000 mg | PEN_INJECTOR | SUBCUTANEOUS | 3 refills | Status: AC

## 2023-10-31 MED ORDER — OMEPRAZOLE 40 MG PO CPDR: 40.0000 mg | DELAYED_RELEASE_CAPSULE | Freq: Every day | ORAL | 2 refills | Status: AC

## 2023-10-31 MED ORDER — MEDROXYPROGESTERONE ACETATE 10 MG PO TABS: 10.0000 mg | ORAL_TABLET | Freq: Three times a day (TID) | ORAL | 0 refills | Status: DC

## 2023-10-31 MED ORDER — DEXCOM G6 RECEIVER DEVI: 1.0000 | Freq: Once | 0 refills | Status: AC

## 2023-10-31 MED ORDER — DEXCOM G6 SENSOR MISC: 1.0000 | Freq: Once | 3 refills | Status: AC

## 2023-10-31 MED ORDER — DEXCOM G6 TRANSMITTER MISC: 1.0000 | Freq: Once | 0 refills | Status: AC

## 2023-10-31 MED ORDER — LEVOCETIRIZINE DIHYDROCHLORIDE 5 MG PO TABS: 5.0000 mg | ORAL_TABLET | Freq: Every evening | ORAL | 3 refills | Status: AC

## 2023-10-31 NOTE — Patient Instructions (Addendum)
 LONG ACTING INSULIN INSTRUCTIONS  Check your blood sugar every day. It would be best if you could keep a notebook of dates, blood sugar values, and how much insulin you use. Bring this to your next visit.   If your blood sugar is less than 100, subtract 2 units from the previous day's dose.   If your sugar is between 100-200, you will give yourself the same dose of insulin as the previous day.   If your blood sugar is over 200, you should add 2 units to your previous day's dose.   - For Example: Yesterday you gave yourself 20 units of Insulin. Today your blood sugar is 235. You should give yourself 22 units today. If the next day your sugar is 215, you would give yourself 24 units total.     Sugar    Insulin Dosing  0-100    Subtract 2 units from previous days' dose 101-200   Keep the same dose as the previous day 200 or more   Add 2 units to the previous days dose.      MyChart:  For all urgent or time sensitive needs we ask that you please call the office to avoid delays. Our number is [phone]. MyChart is not constantly monitored and due to the large volume of messages a day, replies may take up to 72 business hours.   MyChart Policy: MyChart allows for you to see your visit notes, after visit summary, provider recommendations, lab and tests results, make an appointment, request refills, and contact your provider or the office for non-urgent questions or concerns. Providers are seeing patients during normal business hours and do not have built in time to review MyChart messages.  We ask that you allow a minimum of 3 business days for responses to [employer]. For this reason, please do not send urgent requests through MyChart. Please call the office at [phone]. New and ongoing conditions may require a visit. We have virtual and in person visit available for your convenience.  Complex MyChart concerns may require a visit. Your provider may request you schedule a virtual  or in person visit to ensure we are providing the best care possible. MyChart messages sent after 11:00 AM on Friday will not be received by the provider until Monday morning.    Lab and Test Results: You will receive your lab and test results on MyChart as soon as they are completed and results have been sent by the lab or testing facility. Due to this service, you will receive your results BEFORE your provider.  I review lab and tests results each morning prior to seeing patients. Some results require collaboration with other providers to ensure you are receiving the most appropriate care. For this reason, we ask that you please allow a minimum of 3-5 business days from the time the ALL results have been received for your provider to receive and review lab and test results and contact you about these.  Most lab and test result comments from the provider will be sent through MyChart. Your provider may recommend changes to the plan of care, follow-up visits, repeat testing, ask questions, or request an office visit to discuss these results. You may reply directly to this message or call the office at [phone] to provide information for the provider or set up an appointment. In some instances, you will be called with test results and recommendations. Please let us know if this is preferred and we will make note of  this in your chart to provide this for you.    If you have not heard a response to your lab or test results in 5 business days from all results returning to MyChart, please call the office to let us know. We ask that you please avoid calling prior to this time unless there is an emergent concern. Due to high call volumes, this can delay the resulting process.   After Hours: For all non-emergency after hours needs, please call the office at [phone] and select the option to reach the on-call provider service. On-call services are shared between multiple [place] offices and therefore it  will not be possible to speak directly with your provider. On-call providers may provide medical advice and recommendations, but are unable to provide refills for maintenance medications.  For all emergency or urgent medical needs after normal business hours, we recommend that you seek care at the closest Urgent Care or Emergency Department to ensure appropriate treatment in a timely manner.  [facility] [place] has a 24 hour emergency room located on the ground floor for your convenience.    Urgent Concerns During the Business Day Providers are seeing patients from 8AM to 5PM, Monday through Thursday, and 8AM to 12PM on Friday with a busy schedule and are most often not able to respond to non-urgent calls until the end of the day or the next business day. If you should have URGENT concerns during the day, please call and speak to the nurse or schedule a same day appointment so that we can address your concern without delay.    Thank you, again, for choosing me as your health care partner. I appreciate your trust and look forward to learning more about you.    Alyson Reedy, FNP-C

## 2023-10-31 NOTE — Progress Notes (Unsigned)
   Established Patient Office Visit  Subjective  Patient ID: Dawn Ortiz, female    DOB: 09/01/1990  Age: 33 y.o. MRN: [id number]  Chief Complaint  Patient presents with   Follow-up    Follow up on Diabetes, libra & dexcom questions, right hand pain and wrist hurts more than the left , still bleeding from merina, needs refill    DM: Very difficult to take multple shots per day Prior to ozempic  Tresiba- have not been doing this  Humalog- only doing this one   Ozempic 1mg  weekly  Has only had one  A1c 8.2%  Dexcom G6  Both hands- R is worse, L thumb with crepitus and popping sensation  No swelling  Through thumb and write  Thumb, first and middle finger went numb about a month ago Difficult to pull off pillow in the AM, difficult to hold phone/turning steering wheel/placing covers on her in bed  Denies injury/trauma  Heat & brace- did not help improve pain   Mirena- having twice a month since having this placed  Placed 07/09/2023   ROS: see HPI    Objective:     BP 139/89 (BP Location: Right Arm, Patient Position: Sitting, Cuff Size: Normal)   Pulse (!) 103   Ht 5\' 2"  (1.575 m)   Wt 175 lb 3.2 oz (79.5 kg)   LMP  (LMP Unknown)   SpO2 96%   BMI 32.04 kg/m  BP Readings from Last 3 Encounters:  10/31/23 139/89  09/30/23 122/87  08/06/23 136/81     Physical Exam    Assessment & Plan:  Type 2 diabetes mellitus with hyperglycemia, with long-term current use of insulin (HCC) -     Dexcom G6 Receiver; 1 each by Does not apply route once for 1 dose.  Dispense: 1 each; Refill: 0 -     Dexcom G6 Sensor; 1 each by Does not apply route once for 1 dose.  Dispense: 1 each; Refill: 3 -     Dexcom G6 Transmitter; 1 each by Does not apply route once for 1 dose.  Dispense: 1 each; Refill: 0 -     POCT glycosylated hemoglobin (Hb A1C) -     Semaglutide (2 MG/DOSE); Inject 2 mg as directed once a week.  Dispense: 3 mL; Refill: 3  Tendinitis, de Quervain's -     Meloxicam;  Take 1 tablet (15 mg total) by mouth daily.  Dispense: 30 tablet; Refill: 0  Abnormal uterine bleeding (AUB) -     medroxyPROGESTERone Acetate; Take 1 tablet (10 mg total) by mouth in the morning, at noon, and at bedtime.  Dispense: 60 tablet; Refill: 0 -     CBC with Differential/Platelet -     Iron, TIBC and Ferritin Panel  History of iron deficiency -     CBC with Differential/Platelet -     Iron, TIBC and Ferritin Panel  Other orders -     Omeprazole; Take 1 capsule (40 mg total) by mouth daily.  Dispense: 30 capsule; Refill: 2 -     Levocetirizine Dihydrochloride; Take 1 tablet (5 mg total) by mouth every evening.  Dispense: 30 tablet; Refill: 3     Return in about 3 months (around 01/30/2024) for Diabetes f/u.    Alyson Reedy, FNP

## 2023-11-01 ENCOUNTER — Encounter: Payer: Self-pay | Admitting: Family Medicine

## 2023-11-01 LAB — CBC WITH DIFFERENTIAL/PLATELET
Basophils Absolute: 0 10*3/uL (ref 0.0–0.2)
Basos: 0 %
EOS (ABSOLUTE): 0.1 10*3/uL (ref 0.0–0.4)
Eos: 1 %
Hematocrit: 39.2 % (ref 34.0–46.6)
Hemoglobin: 12 g/dL (ref 11.1–15.9)
Immature Grans (Abs): 0 10*3/uL (ref 0.0–0.1)
Immature Granulocytes: 0 %
Lymphocytes Absolute: 3.9 10*3/uL — ABNORMAL HIGH (ref 0.7–3.1)
Lymphs: 35 %
MCH: 23.5 pg — ABNORMAL LOW (ref 26.6–33.0)
MCHC: 30.6 g/dL — ABNORMAL LOW (ref 31.5–35.7)
MCV: 77 fL — ABNORMAL LOW (ref 79–97)
Monocytes Absolute: 0.7 10*3/uL (ref 0.1–0.9)
Monocytes: 6 %
Neutrophils Absolute: 6.3 10*3/uL (ref 1.4–7.0)
Neutrophils: 58 %
Platelets: 496 10*3/uL — ABNORMAL HIGH (ref 150–450)
RBC: 5.11 x10E6/uL (ref 3.77–5.28)
RDW: 15.4 % (ref 11.7–15.4)
WBC: 11 10*3/uL — ABNORMAL HIGH (ref 3.4–10.8)

## 2023-11-01 LAB — IRON,TIBC AND FERRITIN PANEL
Ferritin: 57 ng/mL (ref 15–150)
Iron Saturation: 7 % — CL (ref 15–55)
Iron: 24 ug/dL — ABNORMAL LOW (ref 27–159)
Total Iron Binding Capacity: 322 ug/dL (ref 250–450)
UIBC: 298 ug/dL (ref 131–425)

## 2023-11-04 ENCOUNTER — Emergency Department: Admit: 2023-11-04 | Payer: PRIVATE HEALTH INSURANCE | Primary: Family Medicine

## 2023-11-04 ENCOUNTER — Inpatient Hospital Stay
Admit: 2023-11-04 | Discharge: 2023-11-04 | Disposition: A | Discharge status: Home / Self Care | Payer: PRIVATE HEALTH INSURANCE

## 2023-11-04 DIAGNOSIS — W540XXA Bitten by dog, initial encounter: Secondary | ICD-10-CM

## 2023-11-04 DIAGNOSIS — S01551A Open bite of lip, initial encounter: Secondary | ICD-10-CM

## 2023-11-04 LAB — POC PREGNANCY UR-QUAL
HCG, Urine, POC: NEGATIVE
Lot Number: 1810872
Preg Test, Ur: NEGATIVE

## 2023-11-04 MED ORDER — AMOXICILLIN-POT CLAVULANATE 875-125 MG PO TABS
875-125 | ORAL_TABLET | Freq: Two times a day (BID) | ORAL | 0 refills | Status: AC
Start: 2023-11-04 — End: 2023-11-11

## 2023-11-04 MED ORDER — IBUPROFEN 800 MG PO TABS
800 | ORAL_TABLET | Freq: Three times a day (TID) | ORAL | 0 refills | Status: AC | PRN
Start: 2023-11-04 — End: ?

## 2023-11-04 MED ORDER — LIDOCAINE HCL 1 % IJ SOLN
1 | Freq: Once | INTRAMUSCULAR | Status: AC
Start: 2023-11-04 — End: 2023-11-04
  Administered 2023-11-04: 12:00:00 5 mL via INTRADERMAL

## 2023-11-04 MED ORDER — HYDROCODONE-ACETAMINOPHEN 5-325 MG PO TABS
5-325 | Freq: Once | ORAL | Status: DC
Start: 2023-11-04 — End: 2023-11-04

## 2023-11-04 MED ORDER — IBUPROFEN 800 MG PO TABS
800 | Freq: Once | ORAL | Status: AC
Start: 2023-11-04 — End: 2023-11-04
  Administered 2023-11-04: 12:00:00 800 mg via ORAL

## 2023-11-04 MED ORDER — KETOROLAC TROMETHAMINE 15 MG/ML IJ SOLN
15 | Freq: Once | INTRAMUSCULAR | Status: DC
Start: 2023-11-04 — End: 2023-11-04

## 2023-11-04 MED ORDER — AMOXICILLIN-POT CLAVULANATE 875-125 MG PO TABS
875-125 | Freq: Once | ORAL | Status: AC
Start: 2023-11-04 — End: 2023-11-04
  Administered 2023-11-04: 12:00:00 1 via ORAL

## 2023-11-04 MED ORDER — TETANUS-DIPHTH-ACELL PERTUSSIS 5-2.5-18.5 LF-MCG/0.5 IM SUSY
5-2.5-18.5 | Freq: Once | INTRAMUSCULAR | Status: AC
Start: 2023-11-04 — End: 2023-11-04
  Administered 2023-11-04: 12:00:00 0.5 mL via INTRAMUSCULAR

## 2023-11-04 MED FILL — AMOXICILLIN-POT CLAVULANATE 875-125 MG PO TABS: 875-125 MG | ORAL | Qty: 1

## 2023-11-04 MED FILL — IBUPROFEN 800 MG PO TABS: 800 MG | ORAL | Qty: 1

## 2023-11-04 MED FILL — HYDROCODONE-ACETAMINOPHEN 5-325 MG PO TABS: 5-325 MG | ORAL | Qty: 1

## 2023-11-04 MED FILL — BOOSTRIX 5-2.5-18.5 LF-MCG/0.5 IM SUSY: 5-2.5-18.5 LF-MCG/0.5 | INTRAMUSCULAR | Qty: 0.5

## 2023-11-04 MED FILL — LIDOCAINE HCL 1 % IJ SOLN: 1 % | INTRAMUSCULAR | Qty: 10

## 2023-11-04 NOTE — Discharge Instructions (Addendum)
 Take the entire course of antibiotics until complete.  Take the prescribed ibuprofen tablets every 8 hours as needed for pain and alternate with Tylenol as needed.  Apply ice for pain and swelling as needed.  You may follow-up with your primary care provider.  I have also provided information below for plastic surgery for follow-up as needed.  Return to the ER in about 5 days for suture removal and wound recheck.  Return sooner if you experience any new or worsening symptoms of concern in the meantime.

## 2023-11-04 NOTE — ED Provider Notes (Signed)
 [facility] EMERGENCY DEPT  EMERGENCY DEPARTMENT ENCOUNTER      Pt Name: Heidi Combs  MRN: [id number]  Birthdate November 13, 1990  Date of evaluation: 11/04/2023  Provider evaluation time: 11/04/23 9562  Provider: Jeanella Craze, PA-C    CHIEF COMPLAINT       Chief Complaint   Patient presents with    Animal Bite     Dog bite to face, pt's dog was having a seizure and while postictal, bit pt in the face. Dog is UTD on immunizations. Pt unsure that she's had recent tetanus shot.          HISTORY OF PRESENT ILLNESS    HPI   33 y.o. female presents to ED c/o dog bite to face that occurred jpta. Pt reports her dog had a seizure and her face was next to the dog's face and when the seizure subsided, dog became confused and spooked and bite her in the face. She sustained laceration to left upper lip with some other small abrasions to face and is having left sided jaw pain.   Denies any other areas of facial pain.  Bleeding was controlled prior to arrival with direct pressure.  Denies head pain or LOC.  No other areas of injury.  Dog is up-to-date on rabies vaccinations.  Patient is unsure of timing of her last tetanus vaccination.       REVIEW OF SYSTEMS       Review of Systems   All other systems reviewed and are negative.      PAST MEDICAL HISTORY     Past Medical History:   Diagnosis Date    ADHD (attention deficit hyperactivity disorder)     Anxiety     Depression     GERD (gastroesophageal reflux disease)        SURGICAL HISTORY       Past Surgical History:   Procedure Laterality Date    APPENDECTOMY      TONSILLECTOMY         CURRENT MEDICATIONS       Previous Medications    ALBUTEROL SULFATE HFA (VENTOLIN HFA) 108 (90 BASE) MCG/ACT INHALER    Inhale 2 puffs into the lungs 4 times daily as needed for Wheezing    ESCITALOPRAM (LEXAPRO) 20 MG TABLET    1 tablet Orally Once a day for 30 day(s)       ALLERGIES     Cefaclor and Sumatriptan    FAMILY HISTORY       Family History   Problem Relation Age of Onset     Depression Mother     Alcohol Abuse Father     Cancer Father     Diabetes Father     Early Death Father     High Blood Pressure Father     Colon Cancer Maternal Grandfather     Heart Disease Maternal Grandfather     High Cholesterol Maternal Grandfather     Mental Illness Maternal Aunt     Substance Abuse Brother         SOCIAL HISTORY       Social History     Socioeconomic History    Marital status: Unknown   Tobacco Use    Smoking status: Never    Smokeless tobacco: Never    Tobacco comments:     08/03/2022   Substance and Sexual Activity    Alcohol use: Never    Drug use: Never    Sexual activity: Yes  Partners: Male       PHYSICAL EXAM     Vitals:    Vitals:    11/04/23 0711 11/04/23 0800   BP: 115/69 (!) 140/70   Pulse: 100 67   Resp: 20 20   Temp: 99.1 F (37.3 C)    TempSrc: Oral    SpO2: 100%    Weight: 66.7 kg (147 lb)    Height: 1.702 m (5\' 7" )        Physical Exam  Vitals and nursing note reviewed.   Constitutional:       General: She is not in acute distress.     Appearance: Normal appearance. She is not ill-appearing, toxic-appearing or diaphoretic.   HENT:      Head: Normocephalic and atraumatic.      Jaw: Tenderness and pain on movement present. No trismus or malocclusion.      Nose: No nasal deformity or signs of injury.      Mouth/Throat:      Mouth: Mucous membranes are moist. Lacerations (left upper lip) present.      Dentition: Normal dentition. No dental tenderness.   Eyes:      Extraocular Movements: Extraocular movements intact.      Pupils: Pupils are equal, round, and reactive to light.   Cardiovascular:      Rate and Rhythm: Normal rate and regular rhythm.      Pulses: Normal pulses.   Musculoskeletal:      Cervical back: Normal range of motion and neck supple.   Skin:     General: Skin is warm and dry.      Capillary Refill: Capillary refill takes less than 2 seconds.   Neurological:      General: No focal deficit present.      Mental Status: She is alert and oriented to person, place,  and time.         PROCEDURES:  Lac Repair    Date/Time: 11/04/2023 9:19 AM    Performed by: Jeanella Craze, PA-C  Authorized by: Jeanella Craze, PA-C    Consent:     Consent obtained:  Verbal    Consent given by:  Patient    Risks, benefits, and alternatives were discussed: yes    Anesthesia:     Anesthesia method:  Local infiltration    Local anesthetic:  Lidocaine 1% w/o epi  Laceration details:     Location:  Lip    Lip location:  Upper exterior lip    Length (cm):  3  Pre-procedure details:     Preparation:  Patient was prepped and draped in usual sterile fashion and imaging obtained to evaluate for foreign bodies  Exploration:     Imaging outcome: foreign body not noted      Wound exploration: wound explored through full range of motion and entire depth of wound visualized      Wound extent: no foreign bodies/material noted, no muscle damage noted, no nerve damage noted, no tendon damage noted, no underlying fracture noted and no vascular damage noted      Contaminated: no    Treatment:     Area cleansed with:  Chlorhexidine    Irrigation solution:  Sterile water    Irrigation method:  Syringe  Skin repair:     Repair method:  Sutures    Suture size:  6-0    Suture material:  Nylon    Suture technique:  Simple interrupted    Number of sutures:  5  Approximation:     Approximation:  Close    Vermilion border well-aligned: yes    Repair type:     Repair type:  Complex (Involvement of vermilion border)  Post-procedure details:     Procedure completion:  Tolerated well, no immediate complications  Lac Repair    Date/Time: 11/04/2023 9:24 AM    Performed by: Jeanella Craze, PA-C  Authorized by: Jeanella Craze, PA-C    Consent:     Consent obtained:  Verbal    Consent given by:  Patient    Risks, benefits, and alternatives were discussed: yes    Anesthesia:     Anesthesia method:  Local infiltration    Local anesthetic:  Lidocaine 1% w/o epi  Laceration details:     Location:  Face    Facial  location: left lateral jaw.    Length (cm):  1  Pre-procedure details:     Preparation:  Patient was prepped and draped in usual sterile fashion and imaging obtained to evaluate for foreign bodies  Exploration:     Imaging outcome: foreign body not noted      Wound exploration: wound explored through full range of motion and entire depth of wound visualized      Wound extent: no fascia violation noted, no foreign bodies/material noted, no muscle damage noted, no nerve damage noted, no tendon damage noted, no underlying fracture noted and no vascular damage noted      Contaminated: no    Treatment:     Area cleansed with:  Chlorhexidine    Irrigation solution:  Sterile water    Irrigation method:  Syringe  Skin repair:     Repair method:  Sutures    Suture size:  6-0    Suture material:  Nylon    Suture technique:  Simple interrupted    Number of sutures:  1  Repair type:     Repair type:  Simple  Post-procedure details:     Procedure completion:  Tolerated well, no immediate complications      DIAGNOSTIC RESULTS       RADIOLOGY:   CT MAXILLOFACIAL WO CONTRAST   Final Result   No acute maxillofacial fracture.   Soft tissue injury along the left mandible small locules of gas, indicating that    there is a laceration.          LABS:  Labs Reviewed   POC PREGNANCY UR-QUAL   POC PREGNANCY UR-QUAL       All other labs were within normal range or not returned as of this dictation.    EMERGENCY DEPARTMENT COURSE/MDM:   MDM  Patient is well-appearing in NAD, hemodynamically stable.  On exam there is a laceration to the left upper lip that extends through vermilion border but has no full-thickness involvement.  There are also multiple superficial abrasions to the left side of face and a small laceration to left lateral jaw.  There is tenderness to palpation diffusely to left side of jaw and some mild soft tissue swelling and bruising but no jaw deformity, no trismus or malocclusion.  No other areas of facial injury or head  injury.  No signs of dental injury.  Urine pregnancy is negative.  Patient was given tetanus booster in ED.  Given Motrin for pain and ice applied.  Offered Norco but patient declined.  CT maxillofacial was obtained to rule out acute facial fractures/jaw fracture which is negative.  Wounds were cleansed and irrigated with high-pressure irrigation and  wound closure performed.  Vermilion border was appropriately aligned.  Patient tolerated procedure well.  Discussed wound care at home and instructed to return to ED in 5 days for suture removal.  Also provided information for plastic surgery follow-up as needed.  Started on Augmentin for infection prevention from animal bite and given first dose in ED.  Prescribed NSAIDs as needed for pain and instructed ice therapy.  Discussed ED return precautions in the meantime.  Patient understands and agrees with plan.  Discharged with strict return precautions.         CONSULTS:  None      FINAL IMPRESSION      1. Dog bite, initial encounter    2. Lip laceration, initial encounter    3. Facial laceration, initial encounter    4. Contusion of jaw, initial encounter          DISPOSITION/PLAN   DISPOSITION Decision To Discharge 11/04/2023 09:08:33 AM   DISPOSITION CONDITION Stable           PATIENT REFERRED TO:  [facility] EMERGENCY DEPT  [address]  [place] [address]  [phone]  In 5 days  For suture removal, For wound re-check    [facility] [place]  [address]  [address]  [place] [address]  [phone]  Call         DISCHARGE MEDICATIONS:  New Prescriptions    AMOXICILLIN-CLAVULANATE (AUGMENTIN) 875-125 MG PER TABLET    Take 1 tablet by mouth 2 times daily for 7 days    IBUPROFEN (ADVIL;MOTRIN) 800 MG TABLET    Take 1 tablet by mouth every 8 hours as needed for Pain         (Please note that portions of this note were completed with a voice recognition program.  Efforts were made to edit  the dictations but occasionally words are mis-transcribed.)    Jeanella Craze, PA-C (electronically signed)             Jeanella Craze, PA-C  11/04/23 0935

## 2023-11-04 NOTE — ED Notes (Signed)
Ice pack provided during triage.

## 2023-12-06 ENCOUNTER — Ambulatory Visit: Payer: Managed Care, Other (non HMO) | Admitting: Nurse Practitioner

## 2024-01-06 MED ORDER — ESCITALOPRAM OXALATE 20 MG PO TABS
20 | ORAL_TABLET | Freq: Every day | ORAL | 0 refills | 90.00000 days | Status: DC
Start: 2024-01-06 — End: 2024-02-03

## 2024-01-06 NOTE — Telephone Encounter (Signed)
 Please advise.Medication pended if okay.

## 2024-01-07 ENCOUNTER — Encounter: Payer: PRIVATE HEALTH INSURANCE | Primary: Family Medicine

## 2024-01-30 ENCOUNTER — Encounter: Payer: Self-pay | Admitting: Family Medicine

## 2024-01-30 ENCOUNTER — Ambulatory Visit: Admitting: Family Medicine

## 2024-01-30 VITALS — BP 113/79 | HR 74 | Resp 16 | Ht 62.0 in | Wt 171.2 lb

## 2024-01-30 DIAGNOSIS — E1165 Type 2 diabetes mellitus with hyperglycemia: Secondary | ICD-10-CM | POA: Diagnosis not present

## 2024-01-30 DIAGNOSIS — Z794 Long term (current) use of insulin: Secondary | ICD-10-CM | POA: Diagnosis not present

## 2024-01-30 LAB — POCT GLYCOSYLATED HEMOGLOBIN (HGB A1C)
HbA1c POC (<> result, manual entry): 8.2 % (ref 4.0–5.6)
Hemoglobin A1C: 8.2 % — AB (ref 4.0–5.6)

## 2024-01-30 NOTE — Assessment & Plan Note (Signed)
 Last hemoglobin A1c was 9.1%. Today, A1c was 8.2%. Patient reports she often forgets to take her metformin  and rarely takes her Tresiba  at bedtime. Foot exam completed with no decreased sensation. Due to non-compliance, discussed only taking Ozempic  2mg  weekly and continue with Humalog  sliding scale based on blood sugars. Will hold Tresiba  50 units at bedtime and metformin  500mg  BID. Referral placed for diabetic education to see if we can control blood sugars through healthy nutrition. Nonpharmaological interventions such as focusing on eating a low carb diet, high in vegetables and fruits discussed. Educated on the importance of physical activity. Patient verbalizes understanding regarding plan of care and all questions answered. No refills needed at this time.

## 2024-01-30 NOTE — Patient Instructions (Signed)
 MyChart:  For all urgent or time sensitive needs we ask that you please call the office to avoid delays. Our number is [phone]) [phone]. MyChart is not constantly monitored and due to the large volume of messages a day, replies may take up to 72 business hours.   MyChart Policy: MyChart allows for you to see your visit notes, after visit summary, provider recommendations, lab and tests results, make an appointment, request refills, and contact your provider or the office for non-urgent questions or concerns. Providers are seeing patients during normal business hours and do not have built in time to review MyChart messages.  We ask that you allow a minimum of 3 business days for responses to [employer]. For this reason, please do not send urgent requests through MyChart. Please call the office at [phone]. New and ongoing conditions may require a visit. We have virtual and in person visit available for your convenience.  Complex MyChart concerns may require a visit. Your provider may request you schedule a virtual or in person visit to ensure we are providing the best care possible. MyChart messages sent after 11:00 AM on Friday will not be received by the provider until Monday morning.    Lab and Test Results: You will receive your lab and test results on MyChart as soon as they are completed and results have been sent by the lab or testing facility. Due to this service, you will receive your results BEFORE your provider.  I review lab and tests results each morning prior to seeing patients. Some results require collaboration with other providers to ensure you are receiving the most appropriate care. For this reason, we ask that you please allow a minimum of 3-5 business days from the time the ALL results have been received for your provider to receive and review lab and test results and contact you about these.  Most lab and test result comments from the provider will be sent through MyChart.  Your provider may recommend changes to the plan of care, follow-up visits, repeat testing, ask questions, or request an office visit to discuss these results. You may reply directly to this message or call the office at [phone] to provide information for the provider or set up an appointment. In some instances, you will be called with test results and recommendations. Please let us know if this is preferred and we will make note of this in your chart to provide this for you.    If you have not heard a response to your lab or test results in 5 business days from all results returning to MyChart, please call the office to let us know. We ask that you please avoid calling prior to this time unless there is an emergent concern. Due to high call volumes, this can delay the resulting process.   After Hours: For all non-emergency after hours needs, please call the office at [phone] and select the option to reach the on-call provider service. On-call services are shared between multiple [facility] offices and therefore it will not be possible to speak directly with your provider. On-call providers may provide medical advice and recommendations, but are unable to provide refills for maintenance medications.  For all emergency or urgent medical needs after normal business hours, we recommend that you seek care at the closest Urgent Care or Emergency Department to ensure appropriate treatment in a timely manner.  [facility] has a 24 hour emergency room located on the ground floor for your  convenience.    Urgent Concerns During the Business Day Providers are seeing patients from 8AM to 5PM, Monday through Thursday, and 8AM to 12PM on Friday with a busy schedule and are most often not able to respond to non-urgent calls until the end of the day or the next business day. If you should have URGENT concerns during the day, please call and speak to the nurse or schedule a same day  appointment so that we can address your concern without delay.    Thank you, again, for choosing me as your health care partner. I appreciate your trust and look forward to learning more about you.    Dawn Reedy, FNP-C

## 2024-01-30 NOTE — Progress Notes (Signed)
 Established Patient Office Visit  Subjective  Patient ID: Dawn Ortiz, female    DOB: Nov 28, 1990  Age: 33 y.o. MRN: [id number]  Chief Complaint  Patient presents with   Follow-up   DIABETES: Dawn Ortiz is a pleasant 33 year old female patient who presents for the medical management of diabetes.  Medication compliance: no  Does not take Tresiba  50 units at bedtime & metformin  500mg  BID-- will take Humalog  (about 10- 20 units) when she notices her blood sugar is elevated. She has the [employer]  Denies chest pain, shortness of breath, vision changes, polydipsia, polyphagia, polyuria, open wounds/ulcers on feet.  Reports two hypoglycemic events- in the 60s.  Patient is adhering to a diabetic diet- about 2-3 weeks of good eating.  Patient is not exercising regularly.  Pertinent lab work: A1C: 9.1% on 4/3 and today A1c is 8.2%     Discussed taking Ozempic  2mg  weekly, since she is forgetful with oral medication and nightly insulin  will remove these at this time and see if we can continue her diabetes with Ozempic  2mg  weekly and healthy nutrition options.  current medication regimen: Tresiba  50 units at bedtime, metformin  500mg  BID, Ozempic  2mg  weekly    Lab Results  Component Value Date   HGBA1C 8.2 (A) 01/30/2024   HGBA1C 8.2 01/30/2024    No foot exam found Lab Results  Component Value Date   LABMICR Comment 11/21/2022   LABMICR 31.4 02/26/2019   LABMICR See below: 02/26/2019    Wt Readings from Last 3 Encounters:  01/30/24 171 lb 3.2 oz (77.7 kg)  10/31/23 175 lb 3.2 oz (79.5 kg)  09/30/23 181 lb 9.6 oz (82.4 kg)   ROS: see HPI    Objective:    BP 113/79   Pulse 74   Resp 16   Ht 5' 2 (1.575 m)   Wt 171 lb 3.2 oz (77.7 kg)   BMI 31.31 kg/m  BP Readings from Last 3 Encounters:  01/30/24 113/79  10/31/23 139/89  09/30/23 122/87    Physical Exam Vitals reviewed.  Constitutional:      Appearance: Normal appearance.  Cardiovascular:     Rate and  Rhythm: Normal rate and regular rhythm.     Pulses: Normal pulses.          Dorsalis pedis pulses are 2+ on the right side and 2+ on the left side.       Posterior tibial pulses are 2+ on the right side and 2+ on the left side.     Heart sounds: Normal heart sounds.  Pulmonary:     Effort: Pulmonary effort is normal.     Breath sounds: Normal breath sounds.  Feet:     Right foot:     Protective Sensation: 10 sites tested.  10 sites sensed.     Skin integrity: Skin integrity normal.     Left foot:     Protective Sensation: 10 sites tested.  10 sites sensed.     Skin integrity: Skin integrity normal.  Neurological:     Mental Status: She is alert.  Psychiatric:        Mood and Affect: Mood normal.        Behavior: Behavior normal.       Assessment & Plan:  Type 2 diabetes mellitus with hyperglycemia, with long-term current use of insulin  (HCC) Assessment & Plan: Last hemoglobin A1c was 9.1%. Today, A1c was 8.2%. Patient reports she often forgets to take her metformin  and rarely takes her  Tresiba  at bedtime. Foot exam completed with no decreased sensation. Due to non-compliance, discussed only taking Ozempic  2mg  weekly and continue with Humalog  sliding scale based on blood sugars. Will hold Tresiba  50 units at bedtime and metformin  500mg  BID. Referral placed for diabetic education to see if we can control blood sugars through healthy nutrition. Nonpharmaological interventions such as focusing on eating a low carb diet, high in vegetables and fruits discussed. Educated on the importance of physical activity. Patient verbalizes understanding regarding plan of care and all questions answered. No refills needed at this time.   Orders: -     POCT glycosylated hemoglobin (Hb A1C) -     Microalbumin / creatinine urine ratio -     Amb Referral to Nutrition and Diabetic Education   Return in about 3 months (around 05/01/2024) for Diabetes f/u.    [name], FNP

## 2024-02-01 LAB — MICROALBUMIN / CREATININE URINE RATIO
Creatinine, Urine: 124.7 mg/dL
Microalb/Creat Ratio: 387 mg/g{creat} — ABNORMAL HIGH (ref 0–29)
Microalbumin, Urine: 482.2 ug/mL

## 2024-02-03 MED ORDER — ESCITALOPRAM OXALATE 20 MG PO TABS
20 | ORAL_TABLET | Freq: Every day | ORAL | 0 refills | 90.00000 days | Status: DC
Start: 2024-02-03 — End: 2024-05-04

## 2024-02-03 NOTE — Telephone Encounter (Signed)
 Please es

## 2024-02-06 ENCOUNTER — Ambulatory Visit: Payer: Self-pay | Admitting: Family Medicine

## 2024-02-09 ENCOUNTER — Encounter: Payer: Self-pay | Admitting: Family Medicine

## 2024-02-09 ENCOUNTER — Other Ambulatory Visit: Payer: Self-pay | Admitting: Family Medicine

## 2024-02-09 DIAGNOSIS — R809 Proteinuria, unspecified: Secondary | ICD-10-CM

## 2024-02-09 DIAGNOSIS — Z794 Long term (current) use of insulin: Secondary | ICD-10-CM

## 2024-02-09 MED ORDER — EMPAGLIFLOZIN 10 MG PO TABS: 10.0000 mg | ORAL_TABLET | Freq: Every day | ORAL | 2 refills | Status: DC

## 2024-03-12 ENCOUNTER — Encounter: Payer: Self-pay | Admitting: Family Medicine

## 2024-03-12 ENCOUNTER — Ambulatory Visit: Admitting: Family Medicine

## 2024-03-12 VITALS — BP 129/81 | HR 74 | Resp 16 | Wt 164.6 lb

## 2024-03-12 DIAGNOSIS — M654 Radial styloid tenosynovitis [de Quervain]: Secondary | ICD-10-CM

## 2024-03-12 DIAGNOSIS — R3989 Other symptoms and signs involving the genitourinary system: Secondary | ICD-10-CM | POA: Diagnosis not present

## 2024-03-12 DIAGNOSIS — R8290 Unspecified abnormal findings in urine: Secondary | ICD-10-CM | POA: Diagnosis not present

## 2024-03-12 DIAGNOSIS — M79675 Pain in left toe(s): Secondary | ICD-10-CM

## 2024-03-12 LAB — POCT URINALYSIS DIP (CLINITEK)
Bilirubin, UA: NEGATIVE
Glucose, UA: NEGATIVE mg/dL
Ketones, POC UA: NEGATIVE mg/dL
Leukocytes, UA: NEGATIVE
Nitrite, UA: NEGATIVE
POC PROTEIN,UA: NEGATIVE
Spec Grav, UA: 1.015 (ref 1.010–1.025)
Urobilinogen, UA: 1 U/dL
pH, UA: 7 (ref 5.0–8.0)

## 2024-03-12 NOTE — Patient Instructions (Signed)
 MyChart:  For all urgent or time sensitive needs we ask that you please call the office to avoid delays. Our number is [phone] [phone]. MyChart is not constantly monitored and due to the large volume of messages a day, replies may take up to 72 business hours.   MyChart Policy: MyChart allows for you to see your visit notes, after visit summary, provider recommendations, lab and tests results, make an appointment, request refills, and contact your provider or the office for non-urgent questions or concerns. Providers are seeing patients during normal business hours and do not have built in time to review MyChart messages.  We ask that you allow a minimum of 3 business days for responses to [employer]. For this reason, please do not send urgent requests through MyChart. Please call the office at [phone]. New and ongoing conditions may require a visit. We have virtual and in person visit available for your convenience.  Complex MyChart concerns may require a visit. Your provider may request you schedule a virtual or in person visit to ensure we are providing the best care possible. MyChart messages sent after 11:00 AM on Friday will not be received by the provider until Monday morning.    Lab and Test Results: You will receive your lab and test results on MyChart as soon as they are completed and results have been sent by the lab or testing facility. Due to this service, you will receive your results BEFORE your provider.  I review lab and tests results each morning prior to seeing patients. Some results require collaboration with other providers to ensure you are receiving the most appropriate care. For this reason, we ask that you please allow a minimum of 3-5 business days from the time the ALL results have been received for your provider to receive and review lab and test results and contact you about these.  Most lab and test result comments from the provider will be sent through MyChart.  Your provider may recommend changes to the plan of care, follow-up visits, repeat testing, ask questions, or request an office visit to discuss these results. You may reply directly to this message or call the office at [phone] to provide information for the provider or set up an appointment. In some instances, you will be called with test results and recommendations. Please let us  know if this is preferred and we will make note of this in your chart to provide this for you.    If you have not heard a response to your lab or test results in 5 business days from all results returning to MyChart, please call the office to let us  know. We ask that you please avoid calling prior to this time unless there is an emergent concern. Due to high call volumes, this can delay the resulting process.   After Hours: For all non-emergency after hours needs, please call the office at [phone] and select the option to reach the on-call provider service. On-call services are shared between multiple [facility] offices and therefore it will not be possible to speak directly with your provider. On-call providers may provide medical advice and recommendations, but are unable to provide refills for maintenance medications.  For all emergency or urgent medical needs after normal business hours, we recommend that you seek care at the closest Urgent Care or Emergency Department to ensure appropriate treatment in a timely manner.  [facility] has a 24 hour emergency room located on the ground floor for your  convenience.    Urgent Concerns During the Business Day Providers are seeing patients from 8AM to 5PM, Monday through Thursday, and 8AM to 12PM on Friday with a busy schedule and are most often not able to respond to non-urgent calls until the end of the day or the next business day. If you should have URGENT concerns during the day, please call and speak to the nurse or schedule a same day  appointment so that we can address your concern without delay.    Thank you, again, for choosing me as your health care partner. I appreciate your trust and look forward to learning more about you.    [name], FNP-C

## 2024-03-12 NOTE — Progress Notes (Signed)
 urine     Acute Care Office Visit  Subjective:   Dawn Ortiz 01-06-1991 03/12/2024  Chief Complaint  Patient presents with   Diabetes    BS 140 range   Toe Pain    Left Foot 3rd digit   HPI: DYSURIA:  Onset: Day 3 Worsening: yesterday   Symptoms Urgency: no  Frequency: no  Hesitancy: no  Bladder pressure: yes  Hematuria: no  Flank Pain: no  Fever: no           Nausea/Vomiting: no  Pregnant: no STD exposure/history: no Discharge: no Rash: no  Red Flags: (Risk Factors for Complicated UTI) Recent Antibiotic Usage (last 30 days): no  Symptoms lasting more than seven (7) days: no  More than 3 UTI's last 12 months: no   PMH of  1. DM: yes 2. Renal Disease/Calculi: no 3. Urinary Tract Abnormality: no  4. Instrumentation/Trauma: no 5. Immunosuppression: no 6. Pregnant no    L TOE PAIN: 4th digit, since funeral- it hurts to walk on or putting pressure on it  Denies injury/trauma  It has been 2 weeks  Denies swelling, discoloration.   The following portions of the patient's history were reviewed and updated as appropriate: past medical history, past surgical history, family history, social history, allergies, medications, and problem list.   Patient Active Problem List   Diagnosis Date Noted   Hidradenitis suppurativa 04/29/2020   Obesity (BMI 30.0-34.9) 04/29/2020   Attention deficit hyperactivity disorder (ADHD), 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   Type 2 diabetes mellitus with hyperglycemia, with long-term current use of insulin  (HCC) 02/06/2019   Anxiety    GERD (gastroesophageal reflux disease)    Past Medical History:  Diagnosis Date   Abscess of left axilla 07/02/2016   Abscess of left breast    Abscess of left thigh    Abscess of right breast    Abscess of right thigh    Abscess of the breast and nipple 03/02/2019   Allergy    Anxiety    Axillary abscess 07/02/2016   Biliary colic    Diabetes mellitus without complication  (HCC)    GERD (gastroesophageal reflux disease)    Leukocytosis 07/19/2019   Migraines    Polyhydramnios in third trimester 05/03/2023   Pre-existing type 2 diabetes mellitus during pregnancy in third trimester 05/03/2023   Preeclampsia 05/23/2023   Pregnancy, supervision, high-risk 11/28/2022              Clinical Staff    Provider      Office Location     [facility]    Dating     06/08/2023, by Last Menstrual Period      Language     English    Anatomy US      Normal      Flu Vaccine     UTD    Genetic Screen     NIPS: [id number]      TDaP vaccine      03/20/2023    Hgb A1C or   GTT    Early : A1c 8.1  Third trimester: N/A      Covid              LAB RESULTS       Rhogam     AB/Positi   Sinus tachycardia 04/29/2020   Ulcer    Past Surgical History:  Procedure Laterality Date   CESAREAN SECTION  05/24/2023   Procedure: CESAREAN SECTION;  Surgeon: Leigh Sober, MD;  Location:  [facility];  Service: Obstetrics;;   CHOLECYSTECTOMY     ESOPHAGOGASTRODUODENOSCOPY (EGD) WITH PROPOFOL  N/A 10/01/2019   Procedure: ESOPHAGOGASTRODUODENOSCOPY (EGD) WITH PROPOFOL ;  Surgeon: Unk Corinn Skiff, MD;  Location: [facility];  Service: Gastroenterology;  Laterality: N/A;   I & D EXTREMITY Left 03/05/2019   Procedure: Irrigation and debridement;  Surgeon: Desiderio Schanz, MD;  Location: [facility];  Service: General;  Laterality: Left;   INCISION AND DRAINAGE     right breast x1 and left groin x1 and left axilla   INCISION AND DRAINAGE ABSCESS Left 07/02/2016   Procedure: INCISION AND DRAINAGE ABSCESS;  Surgeon: Charlie FORBES Fell, MD;  Location: [facility];  Service: General;  Laterality: Left;   INCISION AND DRAINAGE ABSCESS Left 04/18/2017   Procedure: INCISION AND DRAINAGE ABSCESS-LEFT GROIN;  Surgeon: Nicholaus Selinda Birmingham, MD;  Location: [facility];  Service: General;  Laterality: Left;   INCISION AND DRAINAGE ABSCESS Right 05/21/2018   Procedure: INCISION AND DRAINAGE ABSCESS- RIGHT THIGH;  Surgeon: Desiderio Schanz, MD;  Location: [facility];  Service: General;  Laterality: Right;   INCISION AND DRAINAGE ABSCESS Left 08/23/2020   Procedure: INCISION AND DRAINAGE ABSCESS, axillary;  Surgeon: Desiderio Schanz, MD;  Location: [facility];  Service: General;  Laterality: Left;  provider requesting 2 hours /120 minutes for procedure   IRRIGATION AND DEBRIDEMENT ABSCESS Right 04/09/2018   Procedure: IRRIGATION AND DEBRIDEMENT ABSCESS;  Surgeon: Desiderio Schanz, MD;  Location: [facility];  Service: General;  Laterality: Right;   IRRIGATION AND DEBRIDEMENT ABSCESS Left 05/21/2018   Procedure: IRRIGATION AND DEBRIDEMENT BREAST ABSCESS;  Surgeon: Desiderio Schanz, MD;  Location: [facility];  Service: General;  Laterality: Left;   IRRIGATION AND DEBRIDEMENT ABSCESS Right 01/06/2020   Procedure: MINOR INCISION AND DRAINAGE OF ABSCESS, right axillary;  Surgeon: Lane Shope, MD;  Location: [facility];  Service: General;  Laterality: Right;   PILONIDAL CYST EXCISION  12/03/2014   Procedure: CYST EXCISION PILONIDAL EXTENSIVE;  Surgeon: Elsie Cable, MD;  Location: [facility];  Service: General;;   WISDOM TOOTH EXTRACTION     fourl; sometime 2017   Family History  Problem Relation Age of Onset   Varicose Veins Mother    Depression Father    Hypertension Father    Diabetes Father    Heart disease Father    Sarcoidosis Father    Arthritis Father    Kidney disease Father    Obesity Brother    Diabetes Maternal Grandmother    Diabetes Paternal Grandmother    Cancer Paternal Grandfather        colon and also siblings had cancer ?type   Hypertension Other    Anxiety disorder Paternal Aunt    Outpatient Medications Prior to Visit  Medication Sig Dispense Refill   amphetamine -dextroamphetamine  (ADDERALL XR) 20 MG 24 hr capsule Take 1 capsule (20 mg total) by mouth every morning. 30 capsule 0   amphetamine -dextroamphetamine  (ADDERALL) 10 MG tablet Take 1 tablet (10 mg total) by mouth daily with breakfast. 30 tablet 0   BD  PEN NEEDLE MICRO U/F 32G X 6 MM MISC Inject into the skin 3 (three) times daily.     Continuous Glucose Sensor (FREESTYLE LIBRE 3 PLUS SENSOR) MISC      empagliflozin  (JARDIANCE ) 10 MG TABS tablet Take 1 tablet (10 mg total) by mouth daily. 30 tablet 2   Insulin  Degludec (TRESIBA ) 100 UNIT/ML SOLN Inject 50 Units into the skin at bedtime.     insulin  lispro (HUMALOG ) 100 UNIT/ML injection Inject 10-20  Units into the skin daily with lunch.     levocetirizine (XYZAL  ALLERGY 24HR) 5 MG tablet Take 1 tablet (5 mg total) by mouth every evening. 30 tablet 3   levonorgestrel  (MIRENA ) 20 MCG/DAY IUD 1 each by Intrauterine route once.     Levonorgestrel  (MIRENA , 52 MG, IU) by Intrauterine route.     LORazepam  (ATIVAN ) 0.5 MG tablet Take 1 tablet (0.5 mg total) by mouth 2 (two) times daily as needed for anxiety. 30 tablet 0   meloxicam  (MOBIC ) 15 MG tablet Take 1 tablet (15 mg total) by mouth daily. 30 tablet 0   metFORMIN  (GLUCOPHAGE -XR) 500 MG 24 hr tablet Take 1 tablet (500 mg total) by mouth 2 (two) times daily with a meal. 60 tablet 3   omeprazole  (PRILOSEC) 40 MG capsule Take 1 capsule (40 mg total) by mouth daily. 30 capsule 2   Semaglutide , 2 MG/DOSE, 8 MG/3ML SOPN Inject 2 mg as directed once a week. 3 mL 3   medroxyPROGESTERone  (PROVERA ) 10 MG tablet Take 1 tablet (10 mg total) by mouth in the morning, at noon, and at bedtime. (Patient not taking: Reported on 03/12/2024) 60 tablet 0   No facility-administered medications prior to visit.   Allergies  Allergen Reactions   Sulfa Antibiotics Hives   ROS: A complete ROS was performed with pertinent positives/negatives noted in the HPI. The remainder of the ROS are negative.    Objective:   Today's Vitals   03/12/24 1403  BP: 129/81  Pulse: 74  Resp: 16  SpO2: 99%  Weight: 164 lb 9.6 oz (74.7 kg)  PainSc: 7     GENERAL: Well-appearing, in NAD. Well nourished.  SKIN: Pink, warm and dry. No rash, lesion, ulceration, or ecchymoses.  Head:  Normocephalic. NECK: Trachea midline. Full ROM w/o pain or tenderness. No lymphadenopathy.  EARS: Tympanic membranes are intact, translucent without bulging and without drainage. Appropriate landmarks visualized.  EYES: Conjunctiva clear without exudates. EOMI, PERRL, no drainage present.  NOSE: Septum midline w/o deformity. Nares patent, mucosa pink and non-inflamed w/o drainage. No sinus tenderness.  THROAT: Uvula midline. Oropharynx clear. Tonsils non-inflamed without exudate. Mucous membranes pink and moist.  RESPIRATORY: Chest wall symmetrical. Respirations even and non-labored. Breath sounds clear to auscultation bilaterally.  CARDIAC: S1, S2 present, regular rate and rhythm without murmur or gallops. Peripheral pulses 2+ bilaterally.  MSK: Muscle tone and strength appropriate for age. Joints w/o tenderness, redness, or swelling.  EXTREMITIES: Without clubbing, cyanosis, or edema.  NEUROLOGIC: No motor or sensory deficits. Steady, even gait. C2-C12 intact.  PSYCH/MENTAL STATUS: Alert, oriented x 3. Cooperative, appropriate mood and affect.     Assessment & Plan:   1. Sensation of pressure in bladder area (Primary) Patient presents today with concerns of suprapubic pressure. Denies urinary frequency, urgency and painful urination. Appears well, in no apparent distress.  Vital signs are normal. The abdomen is soft without tenderness, guarding, mass, rebound or organomegaly. No CVA tenderness or inguinal adenopathy noted. Urine dipstick shows positive for RBC's and cloudiness. No concerning symptoms at this time. Advised patient to adequately hydrate. Reviewed allergies and recent antibiotic use. Will send for culture, based on recent history of pyelonephritis, to ensure adequate treatment of bacterial infection. Advised patient if she develops fever, body aches, chills, upper back pain, fatigue/malaise, nausea/vomiting, and any other acute symptoms over the weekend to seek emergency care.    -  POCT URINALYSIS DIP (CLINITEK) - Urine Culture  2. Urine abnormality POCT UA completed showing cloudy urine  and hematuria. Will send for culture to see if any bacteria grows. See #1  3. Toe pain, left Benign exam without any erythema or swelling. Discussed rest, ice and elevation. Advised patient we could do imaging if this pain continues to persist.     Lab Orders         POCT URINALYSIS DIP (CLINITEK)      Return if symptoms worsen or fail to improve.    Patient to reach out to office if new, worrisome, or unresolved symptoms arise or if no improvement in patient's condition. Patient verbalized understanding and is agreeable to treatment plan. All questions answered to patient's satisfaction.    [name], FNP

## 2024-03-14 LAB — URINE CULTURE

## 2024-03-16 ENCOUNTER — Ambulatory Visit: Payer: Self-pay | Admitting: Family Medicine

## 2024-04-09 ENCOUNTER — Encounter: Payer: Self-pay | Admitting: Family Medicine

## 2024-04-09 DIAGNOSIS — Z794 Long term (current) use of insulin: Secondary | ICD-10-CM

## 2024-04-10 MED ORDER — FREESTYLE LIBRE 3 SENSOR MISC: 3 refills | Status: DC

## 2024-05-01 ENCOUNTER — Ambulatory Visit: Admitting: Family Medicine

## 2024-05-04 ENCOUNTER — Encounter

## 2024-05-04 MED ORDER — ESCITALOPRAM OXALATE 20 MG PO TABS
20 | ORAL_TABLET | Freq: Every day | ORAL | 2 refills | 90.00000 days | Status: DC
Start: 2024-05-04 — End: 2024-08-24

## 2024-05-04 NOTE — Telephone Encounter (Signed)
"  Rx loaded for escribe  "

## 2024-05-17 ENCOUNTER — Other Ambulatory Visit: Payer: Self-pay | Admitting: Family Medicine

## 2024-05-17 DIAGNOSIS — E1165 Type 2 diabetes mellitus with hyperglycemia: Secondary | ICD-10-CM

## 2024-07-29 ENCOUNTER — Other Ambulatory Visit: Payer: Self-pay | Admitting: Family Medicine

## 2024-07-29 DIAGNOSIS — E1165 Type 2 diabetes mellitus with hyperglycemia: Secondary | ICD-10-CM

## 2024-07-29 DIAGNOSIS — R809 Proteinuria, unspecified: Secondary | ICD-10-CM

## 2024-08-03 ENCOUNTER — Ambulatory Visit: Admitting: Family Medicine

## 2024-08-03 ENCOUNTER — Encounter: Payer: Self-pay | Admitting: Family Medicine

## 2024-08-03 VITALS — BP 125/77 | HR 84 | Resp 16 | Ht 62.0 in | Wt 154.2 lb

## 2024-08-03 DIAGNOSIS — E1165 Type 2 diabetes mellitus with hyperglycemia: Secondary | ICD-10-CM

## 2024-08-03 DIAGNOSIS — F331 Major depressive disorder, recurrent, moderate: Secondary | ICD-10-CM

## 2024-08-03 DIAGNOSIS — F900 Attention-deficit hyperactivity disorder, predominantly inattentive type: Secondary | ICD-10-CM

## 2024-08-03 DIAGNOSIS — F411 Generalized anxiety disorder: Secondary | ICD-10-CM

## 2024-08-03 DIAGNOSIS — Z794 Long term (current) use of insulin: Secondary | ICD-10-CM | POA: Diagnosis not present

## 2024-08-03 LAB — POCT GLYCOSYLATED HEMOGLOBIN (HGB A1C)
HbA1c POC (<> result, manual entry): 9.8 %
Hemoglobin A1C: 9.8 % — AB (ref 4.0–5.6)

## 2024-08-03 MED ORDER — FREESTYLE LIBRE 3 SENSOR MISC: 5 refills | Status: AC

## 2024-08-03 MED ORDER — FLUOXETINE HCL 10 MG PO TABS: 10.0000 mg | ORAL_TABLET | Freq: Every day | ORAL | 2 refills | Status: DC

## 2024-08-03 MED ORDER — AMPHETAMINE-DEXTROAMPHETAMINE 10 MG PO TABS: 10.0000 mg | ORAL_TABLET | Freq: Every day | ORAL | 0 refills | Status: DC

## 2024-08-03 MED ORDER — INSULIN LISPRO 100 UNIT/ML IJ SOLN: 10.0000 [IU] | Freq: Every day | INTRAMUSCULAR | 3 refills | Status: DC

## 2024-08-03 MED ORDER — LORAZEPAM 0.5 MG PO TABS: 0.5000 mg | ORAL_TABLET | Freq: Two times a day (BID) | ORAL | 0 refills | Status: AC | PRN

## 2024-08-03 MED ORDER — AMPHETAMINE-DEXTROAMPHET ER 20 MG PO CP24: 20.0000 mg | ORAL_CAPSULE | ORAL | 0 refills | Status: DC

## 2024-08-03 NOTE — Progress Notes (Signed)
 "  Established Patient Office Visit  Subjective  Patient ID: Dawn Ortiz, female    DOB: 11/07/1990  Age: 34 y.o. MRN: [id number]  Chief Complaint  Patient presents with   Diabetes   Discussed the use of AI scribe software for clinical note transcription with the patient, who gave verbal consent to proceed.  History of Present Illness   Dawn Ortiz is a 34 year old female with diabetes who presents for a follow-up visit.  She is experiencing significant stress due to recent family losses, including the death of her aunt in March 08, 2025 and another close family member around Christmas. Her A1c increased from 8.2% to 9.8. She has been inconsistent with her medication use and has had dietary indiscretions, particularly around the holidays. She uses Lispro (Humalog ) as needed, typically when her blood sugar exceeds 200 mg/dL. Current medication regimen includes Tresiba  50u nightly, Humalog  10-20u with lunch, jardiance  10mg  daily, and Ozempic  2mg  weekly. Denies chest pain, shortness of breath, vision changes, polydipsia, polyphagia, polyuria, open wounds/ulcers on feet. Denies hypoglycemia.   She is considering restarting medication for anxiety and depression, as these symptoms have resurfaced. She has previously tried Prozac  and Zoloft  but did not take them consistently enough to evaluate their effectiveness. She reports that she did not notice a difference with Zoloft .       08/03/2024    8:58 AM 03/12/2024    2:07 PM 01/30/2024    9:27 AM 01/30/2024    9:13 AM  GAD 7 : Generalized Anxiety Score  Nervous, Anxious, on Edge 1 0 0 0  Control/stop worrying 1 0 0 0  Worry too much - different things 1 0 0 0  Trouble relaxing 1 0 0 0  Restless 1 0 0 0  Easily annoyed or irritable 1 0 0 0  Afraid - awful might happen 1 0 0 0  Total GAD 7 Score 7 0 0 0  Anxiety Difficulty  Not difficult at all Not difficult at all Not difficult at all      08/03/2024    8:57 AM 03/12/2024    2:07 PM 01/30/2024     9:27 AM  PHQ9 SCORE ONLY  PHQ-9 Total Score 9 0  0      Data saved with a previous flowsheet row definition    Lab Results  Component Value Date   HGBA1C 8.2 (A) 01/30/2024   HGBA1C 8.2 01/30/2024    No foot exam found Lab Results  Component Value Date   LABMICR 482.2 01/30/2024   LABMICR Comment 11/21/2022    Wt Readings from Last 3 Encounters:  08/03/24 154 lb 3.2 oz (69.9 kg)  03/12/24 164 lb 9.6 oz (74.7 kg)  01/30/24 171 lb 3.2 oz (77.7 kg)   ROS: see HPI     Objective:    BP 125/77   Pulse 84   Resp 16   Ht 5' 2 (1.575 m)   Wt 154 lb 3.2 oz (69.9 kg)   SpO2 98%   Breastfeeding No   BMI 28.20 kg/m  BP Readings from Last 3 Encounters:  08/03/24 125/77  03/12/24 129/81  01/30/24 113/79     Physical Exam Vitals reviewed.  Constitutional:      Appearance: Normal appearance.  Cardiovascular:     Rate and Rhythm: Normal rate and regular rhythm.     Pulses: Normal pulses.     Heart sounds: Normal heart sounds.  Pulmonary:     Effort: Pulmonary effort is normal.  Breath sounds: Normal breath sounds.  Neurological:     Mental Status: She is alert.  Psychiatric:        Mood and Affect: Mood normal.        Behavior: Behavior normal.      Assessment & Plan:   1. Type 2 diabetes mellitus with hyperglycemia, with long-term current use of insulin  (HCC) (Primary) HbA1c increased from 8.0% to 9.8% likely due to stress and inconsistent medication adherence. Refills sent to pharmacy. Discussed importance of medication adherence and focus of nonpharmaological interventions, such as focusing on eating a low carb diet, high in vegetables and fruits, and avoidance of processed foods. Educated on the importance of physical activity. Discussed signs and symptoms of hypoglycemia and need to present to the ED. Patient verbalizes understanding regarding plan of care and all questions answered. Plan to repeat A1c and kidney function in 3 months.  - POCT HgB A1C -  Continuous Glucose Sensor (FREESTYLE LIBRE 3 SENSOR) MISC; Place 1 sensor on the skin every 14 days. Use to check glucose continuously  Dispense: 2 each; Refill: 5 - insulin  lispro (HUMALOG ) 100 UNIT/ML injection; Inject 0.1-0.2 mLs (10-20 Units total) into the skin daily with lunch.  Dispense: 10 mL; Refill: 3  2. GAD (generalized anxiety disorder) GAD completed with score today of 7. Recent exacerbation due to stress. Prozac  (fluoxetine ) chosen for its dual efficacy in managing anxiety and depression. Initiated Prozac  (fluoxetine ) at 10 mg daily. Denies issues with panic attacks, shortness of breath, difficulty breathing, palpitations, hyperventilation, and dizziness. Discussed benefits of cognitive behavioral therapy (CBT) and first-line pharmacotherapy concurrently. Patient is interested in proceeding with medication at this time. Discussed common side effects, including GI side effects, insomnia, lethargy, and decreased libido (usually with higher doses). She is agreeable to trial SSRI medication at this time. Plan for  4-6 week follow-up. Rx sent to pharmacy on file.  - FLUoxetine  (PROZAC ) 10 MG tablet; Take 1 tablet (10 mg total) by mouth daily.  Dispense: 30 tablet; Refill: 2 - LORazepam  (ATIVAN ) 0.5 MG tablet; Take 1 tablet (0.5 mg total) by mouth 2 (two) times daily as needed for anxiety.  Dispense: 30 tablet; Refill: 0  3. MDD (major depressive disorder), recurrent episode, moderate (HCC) PHQ9 completed today with score of 9. Recurrent moderate major depressive disorder exacerbated by stress. Prozac  (fluoxetine ) chosen for its efficacy in managing anxiety and depression. Discussed potential side effects and informed about 4-6 weeks for mood improvement. Initiated Prozac  (fluoxetine ) at 10 mg daily. Scheduled follow-up in 4-6 weeks to assess mood response, with option for virtual visit. - FLUoxetine  (PROZAC ) 10 MG tablet; Take 1 tablet (10 mg total) by mouth daily.  Dispense: 30 tablet; Refill:  2  4. Attention deficit hyperactivity disorder (ADHD), predominantly inattentive type Patient would like to continue management of ADHD with current medication regimen of Adderall XR 20mg  daily & IR 10mg  as needed. Patient has been inconsistent with use due to recent life stressors and plans to get back on track. PDMP reviewed, no red flags. Prescriptions sent to pharmacy on file. Will follow-up in 4 weeks, along with mood.  - amphetamine -dextroamphetamine  (ADDERALL XR) 20 MG 24 hr capsule; Take 1 capsule (20 mg total) by mouth every morning.  Dispense: 30 capsule; Refill: 0 - amphetamine -dextroamphetamine  (ADDERALL) 10 MG tablet; Take 1 tablet (10 mg total) by mouth daily with breakfast.  Dispense: 30 tablet; Refill: 0   Return for 4 weeks for mood; 12 weeks for DM2.    Diane Hanel  Towana, FNP "

## 2024-08-03 NOTE — Patient Instructions (Signed)

## 2024-08-05 ENCOUNTER — Encounter: Payer: Self-pay | Admitting: Family Medicine

## 2024-08-05 NOTE — Telephone Encounter (Signed)
 Please see mychart message sent by pt and advise.

## 2024-08-10 ENCOUNTER — Other Ambulatory Visit: Payer: Self-pay | Admitting: Family Medicine

## 2024-08-10 DIAGNOSIS — Z794 Long term (current) use of insulin: Secondary | ICD-10-CM

## 2024-08-10 MED ORDER — HUMALOG KWIKPEN 200 UNIT/ML ~~LOC~~ SOPN: 10.0000 [IU] | PEN_INJECTOR | Freq: Every day | SUBCUTANEOUS | 3 refills | Status: AC

## 2024-08-19 ENCOUNTER — Ambulatory Visit
Admission: RE | Admit: 2024-08-19 | Discharge: 2024-08-19 | Disposition: A | Discharge status: Home / Self Care | Source: Ambulatory Visit | Attending: Emergency Medicine | Admitting: Emergency Medicine

## 2024-08-19 VITALS — BP 136/93 | HR 115 | Temp 97.9°F | Resp 18

## 2024-08-19 DIAGNOSIS — L02412 Cutaneous abscess of left axilla: Secondary | ICD-10-CM

## 2024-08-19 MED ORDER — DOXYCYCLINE HYCLATE 100 MG PO CAPS: 100.0000 mg | ORAL_CAPSULE | Freq: Two times a day (BID) | ORAL | 0 refills | Status: AC

## 2024-08-19 NOTE — ED Triage Notes (Signed)
 Patient complains of abscess under left  armpit x 3 days.  Patient rates pain 7/10. Patient has not taken anything for symptoms.

## 2024-08-19 NOTE — Discharge Instructions (Signed)
 Abscess has been drained here in the clinic  Take doxycycline twice daily for 10 days  Hold warm-hot compresses to affected area at least 4 times a day, this helps to facilitate draining, the more the better  Please return for evaluation for increased swelling, increased tenderness or pain, non healing site, non draining site, you begin to have fever or chills   We reviewed the etiology of recurrent abscesses of skin.  Skin abscesses are collections of pus within the dermis and deeper skin tissues. Skin abscesses manifest as painful, tender, fluctuant, and erythematous nodules, frequently surmounted by a pustule and surrounded by a rim of erythematous swelling.  Spontaneous drainage of purulent material may occur.  Fever can occur on occasion.    -Skin abscesses can develop in healthy individuals with no predisposing conditions other than skin or nasal carriage of Staphylococcus aureus.  Individuals in close contact with others who have active infection with skin abscesses are at increased risk which is likely to explain why twin brother has similar episodes.   In addition, any process leading to a breach in the skin barrier can also predispose to the development of a skin abscesses, such as atopic dermatitis.

## 2024-08-19 NOTE — ED Provider Notes (Signed)
 " CAY RALPH PELT    CSN: [id number] Arrival date & time: 08/19/24  1738      History   Chief Complaint Chief Complaint  Patient presents with   Abscess    Right under arm - Entered by patient    HPI Dawn Ortiz is a 34 y.o. female.   Patient presents for evaluation of abscess to the left axilla present for 3 days.  Has increased in size and become painful.  Has attempted use of ibuprofen  and Tylenol .  Denies drainage or fever.        Past Medical History:  Diagnosis Date   Abscess of left axilla 07/02/2016   Abscess of left breast    Abscess of left thigh    Abscess of right breast    Abscess of right thigh    Abscess of the breast and nipple 03/02/2019   Allergy    Anxiety    Axillary abscess 07/02/2016   Biliary colic    Diabetes mellitus without complication (HCC)    GERD (gastroesophageal reflux disease)    Leukocytosis 07/19/2019   Migraines    Polyhydramnios in third trimester 05/03/2023   Pre-existing type 2 diabetes mellitus during pregnancy in third trimester 05/03/2023   Preeclampsia 05/23/2023   Pregnancy, supervision, high-risk 11/28/2022              Clinical Staff    Provider      Office Location     [facility]    Dating     06/08/2023, by Last Menstrual Period      Language     English    Anatomy US      Normal      Flu Vaccine     UTD    Genetic Screen     NIPS: [id number]      TDaP vaccine      03/20/2023    Hgb A1C or   GTT    Early : A1c 8.1  Third trimester: N/A      Covid              LAB RESULTS       Rhogam     AB/Positi   Sinus tachycardia 04/29/2020   Ulcer     Patient Active Problem List   Diagnosis Date Noted   Hidradenitis suppurativa 04/29/2020   Obesity (BMI 30.0-34.9) 04/29/2020   Attention deficit hyperactivity disorder (ADHD), 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   Type 2 diabetes mellitus with hyperglycemia, with long-term current use of insulin  (HCC) 02/06/2019   Anxiety    GERD (gastroesophageal reflux  disease)     Past Surgical History:  Procedure Laterality Date   CESAREAN SECTION  05/24/2023   Procedure: CESAREAN SECTION;  Surgeon: Leigh Sober, MD;  Location: [facility];  Service: Obstetrics;;   CHOLECYSTECTOMY     ESOPHAGOGASTRODUODENOSCOPY (EGD) WITH PROPOFOL  N/A 10/01/2019   Procedure: ESOPHAGOGASTRODUODENOSCOPY (EGD) WITH PROPOFOL ;  Surgeon: Unk Corinn Skiff, MD;  Location: [facility];  Service: Gastroenterology;  Laterality: N/A;   I & D EXTREMITY Left 03/05/2019   Procedure: Irrigation and debridement;  Surgeon: Desiderio Schanz, MD;  Location: [facility];  Service: General;  Laterality: Left;   INCISION AND DRAINAGE     right breast x1 and left groin x1 and left axilla   INCISION AND DRAINAGE ABSCESS Left 07/02/2016   Procedure: INCISION AND DRAINAGE ABSCESS;  Surgeon: Charlie FORBES Fell, MD;  Location: [facility];  Service: General;  Laterality: Left;   INCISION AND  DRAINAGE ABSCESS Left 04/18/2017   Procedure: INCISION AND DRAINAGE ABSCESS-LEFT GROIN;  Surgeon: Nicholaus Selinda Birmingham, MD;  Location: [facility];  Service: General;  Laterality: Left;   INCISION AND DRAINAGE ABSCESS Right 05/21/2018   Procedure: INCISION AND DRAINAGE ABSCESS- RIGHT THIGH;  Surgeon: Desiderio Schanz, MD;  Location: [facility];  Service: General;  Laterality: Right;   INCISION AND DRAINAGE ABSCESS Left 08/23/2020   Procedure: INCISION AND DRAINAGE ABSCESS, axillary;  Surgeon: Desiderio Schanz, MD;  Location: [facility];  Service: General;  Laterality: Left;  provider requesting 2 hours /120 minutes for procedure   IRRIGATION AND DEBRIDEMENT ABSCESS Right 04/09/2018   Procedure: IRRIGATION AND DEBRIDEMENT ABSCESS;  Surgeon: Desiderio Schanz, MD;  Location: [facility];  Service: General;  Laterality: Right;   IRRIGATION AND DEBRIDEMENT ABSCESS Left 05/21/2018   Procedure: IRRIGATION AND DEBRIDEMENT BREAST ABSCESS;  Surgeon: Desiderio Schanz, MD;  Location: [facility];  Service: General;  Laterality: Left;   IRRIGATION AND  DEBRIDEMENT ABSCESS Right 01/06/2020   Procedure: MINOR INCISION AND DRAINAGE OF ABSCESS, right axillary;  Surgeon: Lane Shope, MD;  Location: [facility];  Service: General;  Laterality: Right;   PILONIDAL CYST EXCISION  12/03/2014   Procedure: CYST EXCISION PILONIDAL EXTENSIVE;  Surgeon: Elsie Cable, MD;  Location: [facility];  Service: General;;   WISDOM TOOTH EXTRACTION     fourl; sometime 2017    OB History     Gravida  1   Para  1   Term  1   Preterm      AB      Living  1      SAB      IAB      Ectopic      Multiple  0   Live Births  1            Home Medications    Prior to Admission medications  Medication Sig Start Date End Date Taking? Authorizing Provider  amphetamine -dextroamphetamine  (ADDERALL XR) 20 MG 24 hr capsule Take 1 capsule (20 mg total) by mouth every morning. 08/03/24   Towana Small, FNP  amphetamine -dextroamphetamine  (ADDERALL) 10 MG tablet Take 1 tablet (10 mg total) by mouth daily with breakfast. 08/03/24   Towana Small, FNP  BD PEN NEEDLE MICRO U/F 32G X 6 MM MISC Inject into the skin 3 (three) times daily. 07/04/23   [provider]  Continuous Glucose Sensor (FREESTYLE LIBRE 3 PLUS SENSOR) MISC  01/13/24   [provider]  Continuous Glucose Sensor (FREESTYLE LIBRE 3 SENSOR) MISC Place 1 sensor on the skin every 14 days. Use to check glucose continuously 08/03/24   Towana Small, FNP  FLUoxetine  (PROZAC ) 10 MG tablet Take 1 tablet (10 mg total) by mouth daily. 08/03/24   Towana Small, FNP  Insulin  Degludec (TRESIBA ) 100 UNIT/ML SOLN Inject 50 Units into the skin at bedtime.    [provider]  insulin  lispro (HUMALOG  KWIKPEN) 200 UNIT/ML KwikPen Inject 10-20 Units into the skin daily with lunch. 08/10/24   Towana Small, FNP  JARDIANCE  10 MG TABS tablet TAKE ONE TABLET BY MOUTH ONE TIME DAILY 07/31/24   Butler, Kristina, FNP  levocetirizine (XYZAL  ALLERGY 24HR) 5 MG tablet Take 1 tablet (5 mg  total) by mouth every evening. 10/31/23   Towana Small, FNP  levonorgestrel  (MIRENA ) 20 MCG/DAY IUD 1 each by Intrauterine route once. 07/09/23   Leigh Sober, MD  LORazepam  (ATIVAN ) 0.5 MG tablet Take 1 tablet (0.5 mg total) by mouth 2 (two) times daily  as needed for anxiety. 08/03/24   Towana Small, FNP  omeprazole  (PRILOSEC) 40 MG capsule Take 1 capsule (40 mg total) by mouth daily. 10/31/23   Towana Small, FNP  Semaglutide , 2 MG/DOSE, 8 MG/3ML SOPN Inject 2 mg as directed once a week. 10/31/23   Towana Small, FNP    Family History Family History  Problem Relation Age of Onset   Varicose Veins Mother    Depression Father    Hypertension Father    Diabetes Father    Heart disease Father    Sarcoidosis Father    Arthritis Father    Kidney disease Father    Obesity Brother    Diabetes Maternal Grandmother    Diabetes Paternal Grandmother    Cancer Paternal Grandfather        colon and also siblings had cancer ?type   Hypertension Other    Anxiety disorder Paternal Aunt     Social History Social History[1]   Allergies   Sulfa antibiotics   Review of Systems Review of Systems   Physical Exam Triage Vital Signs ED Triage Vitals  Encounter Vitals Group     BP 08/19/24 1801 (!) 136/93     Girls Systolic BP Percentile --      Girls Diastolic BP Percentile --      Boys Systolic BP Percentile --      Boys Diastolic BP Percentile --      Pulse Rate 08/19/24 1801 (!) 115     Resp 08/19/24 1801 18     Temp 08/19/24 1801 97.9 F (36.6 C)     Temp Source 08/19/24 1801 Oral     SpO2 08/19/24 1801 97 %     Weight --      Height --      Head Circumference --      Peak Flow --      Pain Score 08/19/24 1806 7     Pain Loc --      Pain Education --      Exclude from Growth Chart --    No data found.  Updated Vital Signs BP (!) 136/93 (BP Location: Right Arm)   Pulse (!) 115   Temp 97.9 F (36.6 C) (Oral)   Resp 18   LMP 08/11/2024   SpO2 97%    Breastfeeding No   Visual Acuity Right Eye Distance:   Left Eye Distance:   Bilateral Distance:    Right Eye Near:   Left Eye Near:    Bilateral Near:     Physical Exam Constitutional:      Appearance: Normal appearance.  Eyes:     Extraocular Movements: Extraocular movements intact.  Pulmonary:     Effort: Pulmonary effort is normal.  Skin:    Comments: 2 x 3 abscess noted to the left axilla, Sweta Halseth purulent centering  Neurological:     Mental Status: She is alert and oriented to person, place, and time. Mental status is at baseline.      UC Treatments / Results  Labs (all labs ordered are listed, but only abnormal results are displayed) Labs Reviewed - No data to display  EKG   Radiology No results found.  Procedures Incision and Drainage  Date/Time: 08/20/2024 8:18 AM  Performed by: Teresa Shelba SAUNDERS, NP Authorized by: Teresa Shelba SAUNDERS, NP   Consent:    Consent obtained:  Verbal   Consent given by:  Patient   Risks, benefits, and alternatives were discussed: yes     Risks  discussed:  Incomplete drainage   Alternatives discussed:  No treatment Universal protocol:    Procedure explained and questions answered to patient or proxy's satisfaction: yes     Patient identity confirmed:  Verbally with patient Location:    Type:  Abscess   Size:  2x3   Location: left axilla. Pre-procedure details:    Skin preparation:  Chlorhexidine  with alcohol Anesthesia:    Anesthesia method:  Local infiltration   Local anesthetic:  Lidocaine  1% w/o epi Procedure type:    Complexity:  Simple Procedure details:    Incision types:  Single straight   Drainage:  Purulent and bloody   Drainage amount:  Copious   Wound treatment:  Wound left open   Packing materials:  None Post-procedure details:    Procedure completion:  Tolerated  (including critical care time)  Medications Ordered in UC Medications - No data to display  Initial Impression / Assessment and Plan / UC  Course  I have reviewed the triage vital signs and the nursing notes.  Pertinent labs & imaging results that were available during my care of the patient were reviewed by me and considered in my medical decision making (see chart for details).  Abscess of the left axilla  I&D completed, prescribed doxycycline  extend course to 10 days due to history of type 2 diabetes, recommended daily cleansing, may use over-the-counter analgesics for management of pain as well as warm compresses for comfort advised follow-up with urgent care or primary doctor for further management Final Clinical Impressions(s) / UC Diagnoses   Final diagnoses:  None   Discharge Instructions   None    ED Prescriptions   None    PDMP not reviewed this encounter.     [1]  Social History Tobacco Use   Smoking status: Never    Passive exposure: Never   Smokeless tobacco: Never  Vaping Use   Vaping status: Never Used  Substance Use Topics   Alcohol use: Not Currently    Alcohol/week: 1.0 - 2.0 standard drink of alcohol    Types: 1 - 2 Glasses of wine per week    Comment: none last 24hrs   Drug use: No     Teresa Shelba SAUNDERS, NP 08/20/24 [phone]  "

## 2024-08-20 DIAGNOSIS — L02412 Cutaneous abscess of left axilla: Secondary | ICD-10-CM | POA: Diagnosis not present

## 2024-08-24 ENCOUNTER — Encounter

## 2024-08-24 MED ORDER — ESCITALOPRAM OXALATE 20 MG PO TABS
20 | ORAL_TABLET | Freq: Every day | ORAL | 0 refills | Status: AC
Start: 2024-08-24 — End: 2024-09-23

## 2024-08-24 NOTE — Telephone Encounter (Signed)
"  Per protocol criteria not met. OV > 6 months. Sent to practice for provider discretion.    "

## 2024-08-24 NOTE — Telephone Encounter (Signed)
 Patient called requesting a medication refill :    escitalopram  (LEXAPRO ) 20 MG tablet TAKE 1 TABLET BY MOUTH DAILY 30 day    Any medication changes since last visit?no    Pharmacy:     [facility] [id number] - [place], [place] - [phone] [address]

## 2024-08-25 ENCOUNTER — Telehealth

## 2024-08-25 NOTE — Telephone Encounter (Signed)
 Patient will need ;labs for her PHY. Please assist

## 2024-08-25 NOTE — Telephone Encounter (Signed)
 Labs pended.Please review or add any if needed.

## 2024-08-25 NOTE — Telephone Encounter (Signed)
 I left a voicemail for the patient to give us  a call back to be scheduled for her physical. When the patient calls back, get her scheduled with Jane or Dr. Florian to complete her cpe in the next few weeks. She cannot do a virtual visit, she hasn't been seen in over a year.

## 2024-08-28 ENCOUNTER — Encounter: Payer: PRIVATE HEALTH INSURANCE | Attending: Family Medicine | Primary: Family Medicine

## 2024-09-01 NOTE — Telephone Encounter (Signed)
 CALLED PT AND LEFT VM TO CALL BACK TO RESCHEDULE PHYSICAL.

## 2024-09-03 ENCOUNTER — Telehealth: Payer: Self-pay | Admitting: Family Medicine

## 2024-09-03 ENCOUNTER — Telehealth: Admitting: Family Medicine

## 2024-09-03 DIAGNOSIS — F900 Attention-deficit hyperactivity disorder, predominantly inattentive type: Secondary | ICD-10-CM

## 2024-09-03 DIAGNOSIS — F411 Generalized anxiety disorder: Secondary | ICD-10-CM

## 2024-09-03 DIAGNOSIS — F331 Major depressive disorder, recurrent, moderate: Secondary | ICD-10-CM

## 2024-09-03 MED ORDER — AMPHETAMINE-DEXTROAMPHETAMINE 10 MG PO TABS: 10.0000 mg | ORAL_TABLET | Freq: Every day | ORAL | 0 refills | Status: AC

## 2024-09-03 MED ORDER — FLUOXETINE HCL 20 MG PO TABS: 20.0000 mg | ORAL_TABLET | Freq: Every day | ORAL | 4 refills | Status: AC

## 2024-09-03 MED ORDER — AMPHETAMINE-DEXTROAMPHET ER 20 MG PO CP24: 20.0000 mg | ORAL_CAPSULE | ORAL | 0 refills | Status: AC

## 2024-09-03 NOTE — Progress Notes (Signed)
 "  Virtual Visit via Video Note  I connected with Dawn Ortiz on 09/03/24 at 1:55 PM by a video enabled telemedicine application and verified that I am speaking with the correct person using two identifiers.  Patient Location: Home Provider Location: Office/Clinic  I discussed the limitations, risks, security, and privacy concerns of performing an evaluation and management service by video and the availability of in person appointments. I also discussed with the patient that there may be a patient responsible charge related to this service. The patient expressed understanding and agreed to proceed.  Subjective: PCP: Towana Small, FNP  No chief complaint on file.  Discussed the use of AI scribe software for clinical note transcription with the patient, who gave verbal consent to proceed.  History of Present Illness   Dawn Ortiz is a 34 year old female who presents with increased anxiety and depression symptoms.  Over the past two weeks, she has experienced increased anxiety symptoms, feeling nervous, anxious, or on edge more than half the days, and having trouble relaxing with the same frequency. She worries excessively about various issues and finds it difficult to control or stop worrying on several days, impacting her daily life.  Regarding depression, she has little interest or pleasure in activities and feels tired or has little energy nearly every day. She experiences trouble with sleep, including difficulty falling asleep, staying asleep, or sleeping excessively nearly every day. She has a poor appetite or overeats occasionally, and feels bad about herself or that she has let herself or her family down occasionally. She has trouble concentrating on tasks over half the days. No thoughts of self-harm or feeling better off dead.  She is currently taking Prozac  at the lowest dose without significant changes and uses Ativan  sparingly, about twice in the last 2-3 weeks. She takes  Adderall most days she works but not daily, occasionally feeling anxious when it wears off, though not consistently. No side effects from Prozac , which she takes primarily at night.         09/03/2024    1:33 PM 08/03/2024    8:58 AM 03/12/2024    2:07 PM 01/30/2024    9:27 AM  GAD 7 : Generalized Anxiety Score  Nervous, Anxious, on Edge 2 1  0  0   Control/stop worrying 1 1  0  0   Worry too much - different things 2 1  0  0   Trouble relaxing 2 1  0  0   Restless 0 1  0  0   Easily annoyed or irritable 1 1  0  0   Afraid - awful might happen 1 1  0  0   Total GAD 7 Score 9 7 0 0  Anxiety Difficulty Somewhat difficult  Not difficult at all Not difficult at all     Data saved with a previous flowsheet row definition      09/03/2024    1:34 PM 08/03/2024    8:57 AM 03/12/2024    2:07 PM  PHQ9 SCORE ONLY  PHQ-9 Total Score 13 9 0      Data saved with a previous flowsheet row definition   ROS: Per HPI Current Medications[1]  Observations/Objective: There were no vitals filed for this visit.  General: Alert and oriented x 4. Speaking in clear and full sentences, no audible heavy breathing, no acute distress.  Sounds alert and appropriately interactive.  Appears well.  Face symmetric.  Extraocular movements intact.  Pupils  equal and round.  No nasal flaring or accessory muscle use visualized.  Assessment and Plan: 1. GAD (generalized anxiety disorder) Anxiety score increased from 7 to 9, indicating worsening symptoms. Current Prozac  10 mg ineffective. Discussed increasing dosage and counseling benefits. Increased Prozac  to 20 mg daily. Sent Prozac  prescription to [facility] in [place]. Encouraged exploration of counseling options through [email or web address] or [place] [facility]. Follow-up in 8 weeks or sooner if no improvement in mood.  - FLUoxetine  (PROZAC ) 20 MG tablet; Take 1 tablet (20 mg total) by mouth daily.  Dispense: 30 tablet; Refill: 4  2. MDD (major depressive  disorder), recurrent episode, moderate (HCC) Depression score increased from 9 to 13, indicating worsening symptoms. Current Prozac  10 mg ineffective. Discussed increasing dosage and counseling benefits. Increased Prozac  to 20 mg daily. Sent Prozac  prescription to [facility] in [place]. Encouraged exploration of counseling options through [email or web address] or [place] [facility]. Follow-up in 8 weeks or sooner if no improvement in mood.  - FLUoxetine  (PROZAC ) 20 MG tablet; Take 1 tablet (20 mg total) by mouth daily.  Dispense: 30 tablet; Refill: 4  3. Attention deficit hyperactivity disorder (ADHD), predominantly inattentive type Current Adderall regimen well-tolerated with occasional anxiety noted when medication wears off. No significant side effects. Continue current Adderall regimen. PDMP reviewed, no red flags. Rxs sent to pharmacy.  - amphetamine -dextroamphetamine  (ADDERALL XR) 20 MG 24 hr capsule; Take 1 capsule (20 mg total) by mouth every morning.  Dispense: 30 capsule; Refill: 0 - amphetamine -dextroamphetamine  (ADDERALL) 10 MG tablet; Take 1 tablet (10 mg total) by mouth daily with breakfast.  Dispense: 30 tablet; Refill: 0 - amphetamine -dextroamphetamine  (ADDERALL XR) 20 MG 24 hr capsule; Take 1 capsule (20 mg total) by mouth every morning.  Dispense: 30 capsule; Refill: 0 - amphetamine -dextroamphetamine  (ADDERALL) 10 MG tablet; Take 1 tablet (10 mg total) by mouth daily with breakfast.  Dispense: 30 tablet; Refill: 0   Follow Up Instructions: Return in about 2 months (around 11/01/2024) for diabetes & mood .   I discussed the assessment and treatment plan with the patient. The patient was provided an opportunity to ask questions, and all were answered. The patient agreed with the plan and demonstrated an understanding of the instructions.   The patient was advised to call back or seek an in-person evaluation if the symptoms worsen or if the condition fails to improve as  anticipated.  The above assessment and management plan was discussed with the patient. The patient verbalized understanding of and has agreed to the management plan.   [name], FNP    [1]  Current Outpatient Medications:    amphetamine -dextroamphetamine  (ADDERALL XR) 20 MG 24 hr capsule, Take 1 capsule (20 mg total) by mouth every morning., Disp: 30 capsule, Rfl: 0   [START ON 09/30/2024] amphetamine -dextroamphetamine  (ADDERALL XR) 20 MG 24 hr capsule, Take 1 capsule (20 mg total) by mouth every morning., Disp: 30 capsule, Rfl: 0   amphetamine -dextroamphetamine  (ADDERALL) 10 MG tablet, Take 1 tablet (10 mg total) by mouth daily with breakfast., Disp: 30 tablet, Rfl: 0   [START ON 09/30/2024] amphetamine -dextroamphetamine  (ADDERALL) 10 MG tablet, Take 1 tablet (10 mg total) by mouth daily with breakfast., Disp: 30 tablet, Rfl: 0   BD PEN NEEDLE MICRO U/F 32G X 6 MM MISC, Inject into the skin 3 (three) times daily., Disp: , Rfl:    Continuous Glucose Sensor (FREESTYLE LIBRE 3 PLUS SENSOR) MISC, , Disp: , Rfl:    Continuous Glucose Sensor (FREESTYLE LIBRE 3 SENSOR) MISC, Place 1  sensor on the skin every 14 days. Use to check glucose continuously, Disp: 2 each, Rfl: 5   doxycycline  (VIBRAMYCIN ) 100 MG capsule, Take 1 capsule (100 mg total) by mouth 2 (two) times daily., Disp: 20 capsule, Rfl: 0   FLUoxetine  (PROZAC ) 20 MG tablet, Take 1 tablet (20 mg total) by mouth daily., Disp: 30 tablet, Rfl: 4   Insulin  Degludec (TRESIBA ) 100 UNIT/ML SOLN, Inject 50 Units into the skin at bedtime., Disp: , Rfl:    insulin  lispro (HUMALOG  KWIKPEN) 200 UNIT/ML KwikPen, Inject 10-20 Units into the skin daily with lunch., Disp: 3 mL, Rfl: 3   JARDIANCE  10 MG TABS tablet, TAKE ONE TABLET BY MOUTH ONE TIME DAILY, Disp: 30 tablet, Rfl: 2   levocetirizine (XYZAL  ALLERGY 24HR) 5 MG tablet, Take 1 tablet (5 mg total) by mouth every evening., Disp: 30 tablet, Rfl: 3   levonorgestrel  (MIRENA ) 20 MCG/DAY IUD, 1 each by  Intrauterine route once., Disp: , Rfl:    LORazepam  (ATIVAN ) 0.5 MG tablet, Take 1 tablet (0.5 mg total) by mouth 2 (two) times daily as needed for anxiety., Disp: 30 tablet, Rfl: 0   omeprazole  (PRILOSEC) 40 MG capsule, Take 1 capsule (40 mg total) by mouth daily., Disp: 30 capsule, Rfl: 2   Semaglutide , 2 MG/DOSE, 8 MG/3ML SOPN, Inject 2 mg as directed once a week., Disp: 3 mL, Rfl: 3  "

## 2024-09-03 NOTE — Telephone Encounter (Signed)
 Called and left a voice message for patient to call and schedule an appt for April 6th. (Note. Pt is currently scheduled for March 30th and that appt. Will be cancelled)

## 2024-10-26 ENCOUNTER — Ambulatory Visit: Admitting: Family Medicine
